# Patient Record
Sex: Male | Born: 1935 | Race: White | Hispanic: No | Marital: Married | Smoking: Former smoker
Health system: Southern US, Community
[De-identification: ages and names within clinical notes are randomized; demographics above are authoritative.]

## PROBLEM LIST (undated history)

## (undated) DIAGNOSIS — I739 Peripheral vascular disease, unspecified: Secondary | ICD-10-CM

## (undated) DIAGNOSIS — M1990 Unspecified osteoarthritis, unspecified site: Secondary | ICD-10-CM

## (undated) DIAGNOSIS — C801 Malignant (primary) neoplasm, unspecified: Secondary | ICD-10-CM

## (undated) DIAGNOSIS — I719 Aortic aneurysm of unspecified site, without rupture: Secondary | ICD-10-CM

## (undated) DIAGNOSIS — Z951 Presence of aortocoronary bypass graft: Secondary | ICD-10-CM

## (undated) DIAGNOSIS — D61818 Other pancytopenia: Secondary | ICD-10-CM

## (undated) DIAGNOSIS — I350 Nonrheumatic aortic (valve) stenosis: Secondary | ICD-10-CM

## (undated) DIAGNOSIS — D469 Myelodysplastic syndrome, unspecified: Secondary | ICD-10-CM

## (undated) DIAGNOSIS — I714 Abdominal aortic aneurysm, without rupture: Secondary | ICD-10-CM

## (undated) DIAGNOSIS — E785 Hyperlipidemia, unspecified: Secondary | ICD-10-CM

## (undated) DIAGNOSIS — K219 Gastro-esophageal reflux disease without esophagitis: Secondary | ICD-10-CM

## (undated) DIAGNOSIS — I10 Essential (primary) hypertension: Secondary | ICD-10-CM

## (undated) DIAGNOSIS — I9789 Other postprocedural complications and disorders of the circulatory system, not elsewhere classified: Secondary | ICD-10-CM

## (undated) DIAGNOSIS — I4891 Unspecified atrial fibrillation: Secondary | ICD-10-CM

## (undated) DIAGNOSIS — D126 Benign neoplasm of colon, unspecified: Secondary | ICD-10-CM

## (undated) DIAGNOSIS — L409 Psoriasis, unspecified: Secondary | ICD-10-CM

## (undated) DIAGNOSIS — I2510 Atherosclerotic heart disease of native coronary artery without angina pectoris: Secondary | ICD-10-CM

## (undated) DIAGNOSIS — N400 Enlarged prostate without lower urinary tract symptoms: Secondary | ICD-10-CM

## (undated) DIAGNOSIS — I6529 Occlusion and stenosis of unspecified carotid artery: Secondary | ICD-10-CM

## (undated) DIAGNOSIS — K922 Gastrointestinal hemorrhage, unspecified: Secondary | ICD-10-CM

## (undated) DIAGNOSIS — C4491 Basal cell carcinoma of skin, unspecified: Secondary | ICD-10-CM

## (undated) HISTORY — DX: Benign prostatic hyperplasia without lower urinary tract symptoms: N40.0

## (undated) HISTORY — DX: Occlusion and stenosis of unspecified carotid artery: I65.29

## (undated) HISTORY — DX: Malignant (primary) neoplasm, unspecified: C80.1

## (undated) HISTORY — DX: Psoriasis, unspecified: L40.9

## (undated) HISTORY — DX: Unspecified atrial fibrillation: I48.91

## (undated) HISTORY — PX: TONSILLECTOMY: SUR1361

## (undated) HISTORY — DX: Unspecified osteoarthritis, unspecified site: M19.90

## (undated) HISTORY — DX: Peripheral vascular disease, unspecified: I73.9

## (undated) HISTORY — DX: Nonrheumatic aortic (valve) stenosis: I35.0

## (undated) HISTORY — PX: PROSTATE SURGERY: SHX751

## (undated) HISTORY — DX: Abdominal aortic aneurysm, without rupture: I71.4

## (undated) HISTORY — DX: Gastro-esophageal reflux disease without esophagitis: K21.9

## (undated) HISTORY — DX: Unspecified atrial fibrillation: I97.89

## (undated) HISTORY — DX: Basal cell carcinoma of skin, unspecified: C44.91

## (undated) HISTORY — DX: Essential (primary) hypertension: I10

## (undated) HISTORY — DX: Atherosclerotic heart disease of native coronary artery without angina pectoris: I25.10

## (undated) HISTORY — DX: Benign neoplasm of colon, unspecified: D12.6

## (undated) HISTORY — DX: Gastrointestinal hemorrhage, unspecified: K92.2

## (undated) HISTORY — DX: Presence of aortocoronary bypass graft: Z95.1

## (undated) HISTORY — DX: Hyperlipidemia, unspecified: E78.5

## (undated) HISTORY — PX: CHOLECYSTECTOMY: SHX55

---

## 1988-12-22 HISTORY — PX: EYE SURGERY: SHX253

## 1997-12-22 DIAGNOSIS — I714 Abdominal aortic aneurysm, without rupture, unspecified: Secondary | ICD-10-CM

## 1997-12-22 HISTORY — DX: Abdominal aortic aneurysm, without rupture, unspecified: I71.40

## 1997-12-22 HISTORY — DX: Abdominal aortic aneurysm, without rupture: I71.4

## 1997-12-22 HISTORY — PX: ABDOMINAL AORTIC ANEURYSM REPAIR: SUR1152

## 2005-07-31 ENCOUNTER — Emergency Department: Payer: Self-pay | Admitting: Emergency Medicine

## 2005-07-31 ENCOUNTER — Other Ambulatory Visit: Payer: Self-pay

## 2006-10-16 ENCOUNTER — Ambulatory Visit: Payer: Self-pay | Admitting: Family Medicine

## 2006-12-22 DIAGNOSIS — N400 Enlarged prostate without lower urinary tract symptoms: Secondary | ICD-10-CM

## 2006-12-22 HISTORY — DX: Benign prostatic hyperplasia without lower urinary tract symptoms: N40.0

## 2007-03-04 ENCOUNTER — Ambulatory Visit: Payer: Self-pay | Admitting: Gastroenterology

## 2007-12-23 HISTORY — PX: SPINE SURGERY: SHX786

## 2008-02-12 ENCOUNTER — Emergency Department: Payer: Self-pay | Admitting: Emergency Medicine

## 2008-02-12 ENCOUNTER — Other Ambulatory Visit: Payer: Self-pay

## 2008-10-11 ENCOUNTER — Inpatient Hospital Stay (HOSPITAL_COMMUNITY): Admission: RE | Admit: 2008-10-11 | Discharge: 2008-10-13 | Payer: Self-pay | Admitting: Orthopedic Surgery

## 2009-09-18 ENCOUNTER — Ambulatory Visit: Payer: Self-pay | Admitting: Family Medicine

## 2009-09-28 ENCOUNTER — Ambulatory Visit: Payer: Self-pay | Admitting: Family Medicine

## 2009-10-22 HISTORY — PX: CARDIAC CATHETERIZATION: SHX172

## 2009-11-05 ENCOUNTER — Ambulatory Visit: Payer: Self-pay | Admitting: Internal Medicine

## 2009-11-28 ENCOUNTER — Ambulatory Visit: Payer: Self-pay | Admitting: Vascular Surgery

## 2010-02-22 ENCOUNTER — Ambulatory Visit: Payer: Self-pay | Admitting: Orthopedic Surgery

## 2010-03-26 ENCOUNTER — Ambulatory Visit: Payer: Self-pay | Admitting: Unknown Physician Specialty

## 2010-04-24 ENCOUNTER — Ambulatory Visit: Payer: Self-pay | Admitting: Pain Medicine

## 2010-05-21 ENCOUNTER — Ambulatory Visit: Payer: Self-pay | Admitting: Pain Medicine

## 2010-06-05 ENCOUNTER — Ambulatory Visit: Payer: Self-pay | Admitting: Pain Medicine

## 2010-06-12 ENCOUNTER — Ambulatory Visit: Payer: Self-pay | Admitting: Vascular Surgery

## 2010-06-27 ENCOUNTER — Ambulatory Visit: Payer: Self-pay | Admitting: Pain Medicine

## 2010-07-15 ENCOUNTER — Ambulatory Visit: Payer: Self-pay | Admitting: Pain Medicine

## 2010-10-09 ENCOUNTER — Ambulatory Visit: Payer: Self-pay | Admitting: Orthopedic Surgery

## 2010-10-16 ENCOUNTER — Ambulatory Visit: Payer: Self-pay | Admitting: Orthopedic Surgery

## 2010-12-12 ENCOUNTER — Ambulatory Visit: Payer: Self-pay | Admitting: Vascular Surgery

## 2011-01-29 ENCOUNTER — Other Ambulatory Visit (INDEPENDENT_AMBULATORY_CARE_PROVIDER_SITE_OTHER): Payer: MEDICARE

## 2011-01-29 ENCOUNTER — Ambulatory Visit: Payer: Self-pay | Admitting: Orthopedic Surgery

## 2011-01-29 DIAGNOSIS — I6529 Occlusion and stenosis of unspecified carotid artery: Secondary | ICD-10-CM

## 2011-02-14 NOTE — Procedures (Unsigned)
CAROTID DUPLEX EXAM  INDICATION:  Follow up carotid disease.  HISTORY: Diabetes:  No. Cardiac:  No. Hypertension:  Yes. Smoking:  Previous. Previous Surgery:  No. CV History:  No. Amaurosis Fugax No, Paresthesias No, Hemiparesis No.                                      RIGHT             LEFT Brachial systolic pressure:         128               128 Brachial Doppler waveforms:         WNL               WNL Vertebral direction of flow:        Occluded          Antegrade DUPLEX VELOCITIES (cm/sec) CCA peak systolic                   68                104 ECA peak systolic                   96                135 ICA peak systolic                   205               223 ICA end diastolic                   67                67 PLAQUE MORPHOLOGY:                  Heterogenous      Heterogenous PLAQUE AMOUNT:                      Moderate          Moderate PLAQUE LOCATION:                    ICA/CCA/ECA       ICA/CCA/ECA  IMPRESSION: 1. 60% to 79% bilateral internal carotid artery stenosis. 2. Antegrade left vertebral artery, occluded right vertebral artery. 3. Soft plaque formation observed in the left common carotid artery. 4. No significant change from the prior examination of 06/12/10.  ___________________________________________ Janetta Hora. Fields, MD  LT/MEDQ  D:  01/29/2011  T:  01/29/2011  Job:  [id number]

## 2011-04-14 ENCOUNTER — Ambulatory Visit: Payer: Self-pay | Admitting: Oncology

## 2011-04-22 ENCOUNTER — Ambulatory Visit: Payer: Self-pay | Admitting: Oncology

## 2011-05-06 NOTE — H&P (Signed)
NAME:  [name], [name]        ACCOUNT NO.:  [id number]   MEDICAL RECORD NO.:  [id number]          PATIENT TYPE:  INP   LOCATION:                               FACILITY:  [facility]   PHYSICIAN:  Georges Lynch. Gioffre, M.D.DATE OF BIRTH:  [birth year]   DATE OF ADMISSION:  10/11/2008  DATE OF DISCHARGE:                              HISTORY & PHYSICAL   CHIEF COMPLAINT:  Severe lower back and left leg pain.   HISTORY OF PRESENT ILLNESS:  The patient is an 77 year old gentleman who  is very active with [employer], has developed a severe  lower back, left leg pain while lifting some weights. It is making  ambulation difficult. He is very uncomfortable.  Evaluation found that  he has a herniated disk at L4-5 with significant stenosis.  The disk is  on the left and it migrates proximally.  The patient and Dr. Darrelyn Hillock  have reviewed CT myelograms and discussions and has elected to proceed  with a complete decompressive lumbar laminectomy at L4-5 or spinal  stenosis and a microdiskectomy at L4-5 on the left for a herniated disk.   ALLERGIES:  No known drug allergies.   CURRENT MEDICATIONS:  1. Avalide 300/12.5 mg once a day.  2. Centrum Silver 1 tablet a day.  3. Doxepin 10 mg q.h.s.  4. DynaCirc CR 10 mg once a day.  5. Ecotrin 325 mg a day.  6. Fish Oil 12,000 mg a day.  7. Glucosamine chondroitan 2 tablets a day.  8. Prilosec 20 mg once a day.  9. Zocor 40 mg once a day.  10.Vicodin p.r.n.  11.Robaxin p.r.n.   PAST MEDICAL HISTORY:  1. Hypertension.  2. Aortic aneurysm repair in 1999.  3. Reflux disease.  4. Hiatal hernia.  5. History of BPH with cryotherapy in 2008.  6. Hearing impaired.  7. Hypercholesterolemia.   REVIEW OF SYSTEMS:  Negative for any neurologic issues other than  difficulty hearing.  He does not use hearing aids.  PULMONARY:  Unremarkable.  CARDIOVASCULAR:  He does have history of an aneurysm  repaired surgically in 1999 without any  complications.  He does have  hypertension.  Last stress test was 02/2008 which was reported normal.  GI:  History of hiatal hernia reflux disease which is well controlled  with his current Protonix.  It does have an ulcer 45 years previous.  He  has had his gallbladder removed when he had his aorta repaired.  GU:  Unremarkable.  ENDOCRINE:  Unremarkable.  HEMATOLOGIC:  He did get blood  transfusion with his aorta and gallbladder removal.   PAST SURGICAL HISTORY:  Includes:  1. Aortic aneurysm repair in 1999.  2. Hernia repair.  3. Gallbladder removed without any complications with anesthesia.   FAMILY HISTORY:  Father is deceased with emphysema and heart attack at  the age of 75.  Mother is deceased at 63 from heart failure.  One  brother deceased from a heart attack at 32. One sister deceased with  cancer at age 63.   SOCIAL HISTORY:  The patient is married, lives with his wife.  He is  retired Network engineer.  He has smoked in the past.  No drugs or  alcohol.  He has 3 grown children. He lives in a 1 story home.   PHYSICAL EXAMINATION:  VITAL SIGNS:  Height is 5'10, weight is 165,  blood pressure 138/68, pulse is 70 and regular.  Respirations soft,  nonlabored.  The patient is afebrile.  The patient has difficulty  ambulating due to his significant lower back and left leg pain.  He gets  around with a wheelchair.  HEENT:  Head was normocephalic.  The patient did have some difficulty  hearing at times.  He had no hearing aids.  NECK:  Supple, no palpable lymphadenopathy, good range of motion.  CHEST:  Lung sounds were clear and equal bilaterally.  No wheezes, rales  or rhonchi.  HEART:  Regular rate and rhythm.  The patient had a large anterior  abdominal chest wall incision.  ABDOMEN:  Soft, bowel sounds are present.  EXTREMITIES:  Upper extremities had excellent range of motion with  shoulders, elbows and wrists with 5 out of 5 motor strength.  Lower extremities:  He had a  difficult flexing and extending his left  hip due to pain from the lower back around to the thigh.  He has good  motion both knees.  He has good motion both ankles.  NEURO:  The patient was conscious, alert, and appropriate.  He had  intact light touch sensation in the lower extremities. He did have some  weak hip flexors on the left.  He did have painful motion of the back.  PERIPHERAL VASCULAR:  Carotid pulses are 2+, no bruits.  Radial pulses  2+, dorsalis pedis pulses 1+.  He had no lower extremity edema or venous  stasis changes.   IMPRESSION:  1. Spinal stenosis L4-5 with herniated disk at L4-5 on the left.  2. Hypertension.  3. History of aortic aneurysm repair.  4. Reflux disease with hiatal hernia.  5. Hypercholesterolemia.  6. Hearing impaired.  7. Benign prostatic hypertrophy.  8. Recent March 2009 stress test was reported normal.   PLAN:  The patient has been evaluated by his primary care physician and  cleared for this upcoming surgical procedure.  The patient will undergo  all routine labs and tests prior to having a complete decompressive  lumbar laminectomy at L4-5 or spinal stenosis and a microdiskectomy at  L4-5 on the left for a herniated disk by Dr. Darrelyn Hillock at [facility] on October 11, 2008.      Jamelle Rushing, P.A.    ______________________________  Georges Lynch Darrelyn Hillock, M.D.    RWK/MEDQ  D:  10/03/2008  T:  10/03/2008  Job:  [id number]   cc:   Windy Fast A. Darrelyn Hillock, M.D.  Fax: [phone]

## 2011-05-06 NOTE — Procedures (Signed)
CAROTID DUPLEX EXAM   INDICATION:  Carotid disease.   HISTORY:  Diabetes:  No.  Cardiac:  No.  Hypertension:  Yes.  Smoking:  Previous.  Previous Surgery:  No.  CV History:  Currently asymptomatic.  Amaurosis Fugax No, Paresthesias No, Hemiparesis No.                                       RIGHT             LEFT  Brachial systolic pressure:         138               128  Brachial Doppler waveforms:         Normal            Normal  Vertebral direction of flow:        Not visualized    Antegrade  DUPLEX VELOCITIES (cm/sec)  CCA peak systolic                   59                83  ECA peak systolic                   89                79  ICA peak systolic                   209               208  ICA end diastolic                   69                60  PLAQUE MORPHOLOGY:                  Mixed             Mixed  PLAQUE AMOUNT:                      Moderate-to-severe                  Moderate-to-severe  PLAQUE LOCATION:                    ICA/ECA           ICA/ECA/CCA   IMPRESSION:  1. Doppler velocities suggest a 60% to 79% stenosis of the bilateral      proximal internal carotid arteries.  2. No significant change noted when compared to the previous      examination on 11/28/2009.     ___________________________________________  Janetta Hora. Fields, MD   CH/MEDQ  D:  06/12/2010  T:  06/12/2010  Job:  [id number]

## 2011-05-06 NOTE — Assessment & Plan Note (Signed)
OFFICE VISIT   [name], [name]  DOB:  05-29-1926                                       11/28/2009  CHART#:[id number]   CHIEF COMPLAINT:  Carotid stenosis.   HISTORY OF PRESENT ILLNESS:  The patient is an 77 year old male referred  by Dr. Burnett Sheng for evaluation of asymptomatic carotid stenosis.  The  patient denies any prior history of TIA, amaurosis or stroke.  Carotid  stenosis was found after surveillance ultrasound scan.   CHRONIC MEDICAL PROBLEMS:  Include hypertension, elevated cholesterol  both of which are stable and followed by Dr. Burnett Sheng.  The patient is  also a prior smoker and quit in 1995.   PAST SURGICAL HISTORY:  Abdominal aortic aneurysm in 1999 at [facility] and back surgery for a ruptured disk in 2009.   FAMILY HISTORY:  Unremarkable.   SOCIAL HISTORY:  He is married and has 3 children.  Smoking history is  as above.  He does not consume alcohol regularly.   REVIEW OF SYSTEMS:  Full 12 point review of systems was performed with  the patient.  Please see intake referral form for details.   MEDICATIONS:  1. Prilosec 20 mg once a day.  2. Glucosamine chondroitin.  3. Fish oil.  4. Centrum Silver.  5. Avalide 300/12.5 once a day.  6. Ecotrin 81 mg two daily.  7. DynaCirc 10 mg once a day.  8. Crestor 20 mg once a day.   ALLERGIES:  No known drug allergies.   In addition to his recent carotid workup he also had a cardiac  catheterization.  The records from Dr. Burnett Sheng were reviewed today which  mentions a 50%-60% stenosis of his coronary arteries but these are not  specifically mentioned.  He also had a carotid duplex exam at [facility] and a CT angiogram of the neck which showed a  75% stenosis of the left and right carotid artery.   PHYSICAL EXAM:  Vital signs:  Blood pressure is 151/76 in the left arm,  138/83 in the right arm, pulse is 69 and regular.  Temperature is 97.8.  HEENT:   Unremarkable.  Neck:  Has 2+ carotid pulses without bruit.  Chest:  Clear to auscultation.  Cardiac:  Exam is regular rate and  rhythm without murmur.  Abdomen:  Soft, nontender, nondistended with  well-healed transverse scar.  No masses.  Extremities:  He has 2+ radial  pulses bilaterally.  He has 2+ femoral pulses bilaterally.  He has 3+  popliteal pulses bilaterally.  He has absent pedal pulses bilaterally.  Feet are pink, warm and well-perfused.  Neurologic:  Exam shows  symmetric upper extremity and lower extremity motor strength which is  5/5 and symmetric.  Cranial nerves II-XII are intact.  Skin:  Shows no  ulcers or rashes.  Musculoskeletal:  Shows no major obvious joint  deformities.   He had a repeat carotid duplex exam in our office today which again  shows a 60%-80% stenosis of both internal carotid arteries.  The end  diastolic velocity of both arteries, however, was only 78 on the right  side and 72 on the left side.   In summary, the patient has an asymptomatic 70% bilateral internal  carotid artery stenosis.  He is currently on antiplatelet therapy as  well as lipid lowering agents.  As long as he remains asymptomatic and  the stenosis remains less than 80% I believe the best option is  continued risk factor modification and management and continued serial  ultrasound.  If the stenosis progresses to greater than 80% then we  would consider carotid endarterectomy at that point.  All these options  were discussed with the patient today.  He will return for followup and  a repeat carotid duplex exam in 6 months' time.     Janetta Hora. Fields, MD  Electronically Signed   CEF/MEDQ  D:  11/29/2009  T:  11/29/2009  Job:  [id number]   cc:   Jerl Mina

## 2011-05-06 NOTE — Procedures (Signed)
CAROTID DUPLEX EXAM   INDICATION:  Carotid artery disease.   HISTORY:  Diabetes:  No.  Cardiac:  No.  Hypertension:  Yes.  Smoking:  Previous.  Previous Surgery:  No carotid surgery.  CV History:  Asymptomatic.  Amaurosis Fugax No, Paresthesias No, Hemiparesis No                                       RIGHT             LEFT  Brachial systolic pressure:         132               130  Brachial Doppler waveforms:         WNL               WNL  Vertebral direction of flow:        Antegrade (abnormal)                Antegrade  DUPLEX VELOCITIES (cm/sec)  CCA peak systolic                   86                92  ECA peak systolic                   107               161  ICA peak systolic                   267               243  ICA end diastolic                   78                72  PLAQUE MORPHOLOGY:                  Heterogeneous     Heterogeneous /  homogeneous  PLAQUE AMOUNT:                      Mild / severe     Mild / severe  PLAQUE LOCATION:                    ICA / CCA         ICA / CCA   IMPRESSION:  1. Bilateral internal carotid arteries show evidence of 60%-79%      stenosis correlating with previous ultrasound and CT done at      [facility].  2. Bilateral  vertebral arteries appear antegrade, however, right      demonstrates abnormal flow morphology.  3. Incidental note:  Left mid common carotid artery homogeneous plaque      has an irregular flow surface.   ___________________________________________  Janetta Hora. Fields, MD   AS/MEDQ  D:  11/28/2009  T:  11/28/2009  Job:  [id number]

## 2011-05-06 NOTE — Op Note (Signed)
NAME:  RAYFIELD, BEEM NO.:  [id number]   MEDICAL RECORD NO.:  [id number]          PATIENT TYPE:  INP   LOCATION:  0009                         FACILITY:  [facility]   PHYSICIAN:  Georges Lynch. Gioffre, M.D.DATE OF BIRTH:  01-Apr-1926   DATE OF PROCEDURE:  10/11/2008  DATE OF DISCHARGE:                               OPERATIVE REPORT   SURGEON:  Dr. Darrelyn Hillock.   ASSISTANT:  Dr. Marlowe Kays.   PREOPERATIVE DIAGNOSIS:  1. Spinal stenosis at L4-5.  2. Herniated disk L4-5 on the left.  He had severe pain in his left      lower extremity preoperatively.   POSTOPERATIVE DIAGNOSIS:  1. Spinal stenosis at L4-5.  2. Herniated disk L4-5 on the left.  He had severe pain in his left      lower extremity preoperatively.   OPERATION:  1. Decompressive lumbar laminectomy at L4-5 for spinal stenosis.  2. Microdiskectomy at L4-5 for a large herniated disk on the left.  3. Foraminotomies bilaterally for the L4-5 level.   PROCEDURE:  Under general anesthesia, routine orthopedic prepping and  draping of the lower back carried out.  The patient had 1 gram of IV  Ancef.  Two needles were placed in the back for localization purposes,  and an x-ray was taken.  At this time, we made an incision over the L3-  4, L4-5 space.  Bleeders identified and cauterized.  The muscle then was  stripped from the lamina and spinous processes bilaterally.  Second x-  ray was taken for position purposes.  We then went down and identified  the lamina distal to the spinous process distally identified the L3  spinous process.  An instrument was placed in the disk space at 4-5.  At  that time, we then removed the spinous process of L4.  We went down, and  before we started our laminectomy, we brought the microscope in.  An x-  ray was taken with an instrument in the space to verify the L4-5 space  once again.  We then went down and did a complete central decompression-  type lumbar laminectomy.  We preserved  the facets.  We went out wide and  did foraminotomies.  We protected the dura at all times while using the  microscope.  Following this, we now did foraminotomies bilaterally.  We  went out far laterally at 4-5, gently retracted the dura with the  D'Errico retractor, cauterized lateral recess veins, and identified a  large herniated disk.  We then made a cruciate incision in the posterior  longitudinal ligament and a large amount of disk material extruded out  under pressure.  We then went into the space, cleaned the space out and  then went subligamentous with the Epstein curettes as well as the nerve  hook.  We made sure the root now was free.  We were able now to easily  pass a hockey-stick proximally and distally at the foraminotomy.  I  thoroughly irrigated out the area.  The root and dura now was easily  movable.  We then loosely applied some thrombin-soaked Gelfoam.  I  then  closed the wound in layers in usual fashion except I left a small  proximal deep distal part of the wound open for drainage purposes.  The  subcutaneous tissue was closed with 0 Vicryl.  Skin was closed with  metal staples.  A sterile Neosporin dressing was applied.   SURGEON:  Dr. Darrelyn Hillock.   ASSISTANT:  Dr. Fayrene Fearing [place].           ______________________________  Georges Lynch. Darrelyn Hillock, M.D.     RAG/MEDQ  D:  10/11/2008  T:  10/11/2008  Job:  [id number]   cc:   Windy Fast A. Darrelyn Hillock, M.D.  Fax: [phone]   Jerl Mina  Fax: [phone]

## 2011-05-06 NOTE — Procedures (Signed)
LOWER EXTREMITY ARTERIAL DUPLEX   INDICATION:  Fullness and palpable bilateral popliteal arteries.   HISTORY:  Diabetes:  No.  Cardiac:  No.  Hypertension:  Yes.  Smoking:  Quit.  Previous Surgery:   SINGLE LEVEL ARTERIAL EXAM                          RIGHT                LEFT  Brachial:               141                  131  Anterior tibial:        156                  151  Posterior tibial:       131                  140  Peroneal:  Ankle/Brachial Index:   1.11                 1.07   LOWER EXTREMITY ARTERIAL DUPLEX EXAM   DUPLEX:  No evidence of popliteal artery aneurysm noted bilaterally.   IMPRESSION:  1. No evidence of popliteal artery aneurysm noted bilaterally.  2. Normal ankle brachial index with biphasic Doppler waveforms noted      in bilateral legs.   ___________________________________________  Janetta Hora Fields, MD   MG/MEDQ  D:  11/28/2009  T:  11/29/2009  Job:  [id number]

## 2011-05-09 NOTE — Discharge Summary (Signed)
NAME:  [name], [name] NO.:  [id number]   MEDICAL RECORD NO.:  [id number]          PATIENT TYPE:  INP   LOCATION:  1509                         FACILITY:  [facility]   PHYSICIAN:  Georges Lynch. Gioffre, M.D.DATE OF BIRTH:  [birth year]   DATE OF ADMISSION:  10/11/2008  DATE OF DISCHARGE:  10/13/2008                               DISCHARGE SUMMARY   DISCHARGE SUMMARY CONTINUATION   CONDITION ON DISCHARGE:  The patient's condition upon discharge home is  improved.      Jamelle Rushing, P.A.    ______________________________  Georges Lynch Darrelyn Hillock, M.D.    RWK/MEDQ  D:  11/01/2008  T:  11/01/2008  Job:  [id number]

## 2011-05-09 NOTE — Discharge Summary (Signed)
NAME:  [name], [name] NO.:  [id number]   MEDICAL RECORD NO.:  [id number]          PATIENT TYPE:  INP   LOCATION:  1509                         [facility]ACILITY:  [facility]   PHYSICIAN:  Georges Lynch. Gioffre, M.D.DATE OF BIRTH:  Aug 22, 1926   DATE OF ADMISSION:  10/11/2008  DATE OF DISCHARGE:  10/13/2008                               DISCHARGE SUMMARY   ADMISSION DIAGNOSES:  1. Spinal stenosis L4 with herniated disk at L4-5 on the left.  2. Hypertension.  3. History of aortic aneurysm repair.  4. Reflux disease with hiatal hernia.  5. Hypercholesterolemia.  6. Hearing impaired.  7. Benign prostatic hypertrophy.   DISCHARGE DIAGNOSES:  1. Decompressive lumbar laminectomy for spinal stenosis at L4-5 with      microdiskectomy at L4-5 on the left.  2. Hypertension.  3. History of aortic aneurysm repair.  4. Reflux disease with hiatal hernia.  5. Hypercholesterolemia.  6. Hearing impaired  7. Benign prostatic hypertrophy.   HISTORY OF PRESENT ILLNESS:  The patient is an 77 year old gentleman who  is very active, having severe lower back and left leg pain.  The patient  was lifting some weights, had increased back pain and difficulty with  ambulation.  Evaluation found he had significant spinal stenosis at L4-5  with a large herniated disk at L4-5 on the left.  The patient has  elected to proceed with surgical corrections.   ALLERGIES:  NO KNOWN DRUG ALLERGIES.   MEDICATIONS ON ADMISSION:  1. Avalide 300/12.5 mg a day.  2. Centrum Silver one tablet a day.  3. Doxepin 10 mg q.h.s.  4. DynaCirc CR 10 mg a day.  5. Ecotrin 325 mg a day.  6. Fish oil 1200 units a day.  7. Glucosamine chondroitin 2 tablets a day.  8. Prilosec 20 mg a day.  9. Zocor 40 mg a day.  10.Vicodin p.r.n.  11.Robaxin p.r.n.   SURGICAL PROCEDURES:  On October 11, 2008, the patient was taken to the  OR by Dr. Worthy Rancher and assisted by Dr. Simonne Come.  Under general  anesthesia, the patient  underwent a decompressive lumbar laminectomy at  L4-5 for spinal stenosis and a microdiskectomy at L4-5 on the left for  herniated disk.  The patient tolerated the procedure well.  There was  estimated 50 ml of blood loss.  No complications.  The patient was  transferred recovery room then to the orthopedic floor in good condition  to follow back protocol.   CONSULTATIONS:  The following routine consults were requested:  Physical  therapy, case management, pharmacy.   HOSPITAL COURSE:  On October 11, 2008, the patient was admitted to  [facility] under the care of Dr. Worthy Rancher.  The patient was  taken to the OR where central decompressive lumbar laminectomy was  performed at L4-5 with a microdiskectomy at L4-5 on the left.  The  patient tolerated the procedure well.  There are no complications.  The  patient was transferred to recovery room and then to the orthopedic  floor in good condition with IV pain medicines, antibiotics to follow  back protocol.  The  patient then spent 2 days postoperative care on the  orthopedic floor, and the patient did very well.  His vital signs  remained stable.  He remained afebrile.  His H&H remained stable without  any significant blood loss.  The patient's wound remained benign for any  signs of infection.  His leg remained neuromotor vascularly intact.  He  just had a little bit of back pain on date of discharge.  He was able to  ambulate with physical therapy well.  On postop day #2 his vital signs  were stable.  The patient was neurologically intact with just a little  bit of back pain, and the patient was discharged with outpatient follow-  up.   LABORATORY DATA:  CBC on admission found WBCs 4.9, hemoglobin 14.1,  hematocrit 41.4, platelets 154, H&H on discharge was 11.6/33.3.  Routine  chemistries on admission all within normal limits.  Estimated GFR was  greater than 60.  Urinalysis on admission was normal with a culture of  only  8000 colonies of insignificant growth.   DISCHARGE INSTRUCTIONS:  1. Diet:  No restrictions.  2. Activity:  The patient is to get up and walk as instructed by      therapist.  3. Wound care.  The patient is to change dressing daily.   FOLLOW UP:  Patient needs follow-up appoint with Dr. Darrelyn Hillock in 2 weeks  from date of surgery.  The patient is to call [phone] at that follow-up  appointment.   Home health physical therapy through [place].   MEDICATIONS:  1. Percocet 10/650 one every 4-6 hours for pain if needed.  2. Robaxin 500 mg once every 6 hours for muscle spasms if needed.  3. Simvastatin 40 mg a day.  4. Felodipine ER 10 mg a day.  5. Avalide 300/12.5 mg a day.  6. Multivitamin once a day.  7. Prilosec 20 mg a day.  8. Ecotrin 81 mg 2 tablets once a day.  9. Fish oil two times a day.  10.Glucosamine chondroitin two times a day.  11.Vicodin to be placed on hold while using Percocet.      Jamelle Rushing, P.A.    ______________________________  Georges Lynch Darrelyn Hillock, M.D.    RWK/MEDQ  D:  11/01/2008  T:  11/01/2008  Job:  [id number]

## 2011-07-07 ENCOUNTER — Ambulatory Visit: Payer: Self-pay | Admitting: Oncology

## 2011-07-23 ENCOUNTER — Ambulatory Visit: Payer: Self-pay | Admitting: Oncology

## 2011-07-25 ENCOUNTER — Inpatient Hospital Stay: Payer: Self-pay | Admitting: Surgery

## 2011-07-28 ENCOUNTER — Other Ambulatory Visit: Payer: Self-pay

## 2011-09-22 ENCOUNTER — Ambulatory Visit (INDEPENDENT_AMBULATORY_CARE_PROVIDER_SITE_OTHER): Payer: Medicare Other | Admitting: *Deleted

## 2011-09-22 DIAGNOSIS — I6529 Occlusion and stenosis of unspecified carotid artery: Secondary | ICD-10-CM

## 2011-09-23 LAB — COMPREHENSIVE METABOLIC PANEL
ALT: 42
AST: 28
Albumin: 4.1
Alkaline Phosphatase: 61
BUN: 12
CO2: 30
Calcium: 9.3
Chloride: 100
Creatinine, Ser: 0.97
GFR calc Af Amer: >60
GFR calc non Af Amer: >60
Glucose, Bld: 98
Potassium: 3.9
Sodium: 135
Total Bilirubin: 0.9
Total Protein: 6.4

## 2011-09-23 LAB — CBC
HCT: 41.4
Hemoglobin: 14.1
MCHC: 34.1
MCV: 89.4
Platelets: 154
RBC: 4.63
RDW: 13.7
WBC: 4.9

## 2011-09-23 LAB — DIFFERENTIAL
Basophils Absolute: 0
Basophils Relative: 0
Eosinophils Absolute: 0
Eosinophils Relative: 1
Lymphocytes Relative: 25
Lymphs Abs: 1.2
Monocytes Absolute: 0.4
Monocytes Relative: 8
Neutro Abs: 3.3
Neutrophils Relative %: 66

## 2011-09-23 LAB — URINALYSIS, ROUTINE W REFLEX MICROSCOPIC
Bilirubin Urine: NEGATIVE
Glucose, UA: NEGATIVE
Hgb urine dipstick: NEGATIVE
Ketones, ur: NEGATIVE
Nitrite: NEGATIVE
Protein, ur: NEGATIVE
Specific Gravity, Urine: 1.009
Urobilinogen, UA: 0.2
pH: 7.5

## 2011-09-23 LAB — HEMOGLOBIN AND HEMATOCRIT, BLOOD
HCT: 33.3 — ABNORMAL LOW
HCT: 37 — ABNORMAL LOW
HCT: 37.1 — ABNORMAL LOW
Hemoglobin: 11.6 — ABNORMAL LOW
Hemoglobin: 12.7 — ABNORMAL LOW
Hemoglobin: 12.9 — ABNORMAL LOW

## 2011-09-23 LAB — URINE CULTURE
Colony Count: 8000
Special Requests: NEGATIVE

## 2011-09-23 LAB — PROTIME-INR
INR: 1
Prothrombin Time: 13.4

## 2011-09-23 LAB — APTT: aPTT: 32

## 2011-09-26 NOTE — Procedures (Unsigned)
CAROTID DUPLEX EXAM  INDICATION:  Follow up carotid artery disease.  HISTORY: Diabetes:  No. Cardiac:  No. Hypertension:  Yes. Smoking:  Previous. Previous Surgery:  No. CV History:  No. Amaurosis Fugax No, Paresthesias No, Hemiparesis No.                                      RIGHT             LEFT Brachial systolic pressure:         132               133 Brachial Doppler waveforms:         Normal            Normal Vertebral direction of flow:        Occluded          Antegrade DUPLEX VELOCITIES (cm/sec) CCA peak systolic                   54                61 ECA peak systolic                   59                87 ICA peak systolic                   178               202 ICA end diastolic                   65                67 PLAQUE MORPHOLOGY:                  Mixed             Mixed PLAQUE AMOUNT:                      Moderate          Moderate PLAQUE LOCATION:                    ICA, CCA, ECA     ICA, CCA, ECA  IMPRESSION: 1. Bilateral internal carotid artery velocities suggest 60% to 79%     stenosis. 2. Soft plaque noted in the left common carotid artery. 3. Stable in comparison to previous examination.  ___________________________________________ Janetta Hora Darrick Penna, MD  EM/MEDQ  D:  09/22/2011  T:  09/22/2011  Job:  [id number]

## 2011-10-03 ENCOUNTER — Ambulatory Visit: Payer: Self-pay | Admitting: Oncology

## 2011-10-23 ENCOUNTER — Ambulatory Visit: Payer: Self-pay | Admitting: Oncology

## 2012-02-02 ENCOUNTER — Ambulatory Visit: Payer: Self-pay | Admitting: Oncology

## 2012-02-02 LAB — CBC CANCER CENTER
Basophil #: 0 x10 3/mm (ref 0.0–0.1)
Basophil %: 0.6 %
Eosinophil #: 0.1 x10 3/mm (ref 0.0–0.7)
Eosinophil %: 2.6 %
HCT: 36.5 % — ABNORMAL LOW (ref 40.0–52.0)
HGB: 12.8 g/dL — ABNORMAL LOW (ref 13.0–18.0)
Lymphocyte #: 1 x10 3/mm (ref 1.0–3.6)
Lymphocyte %: 31.6 %
MCH: 30.2 pg (ref 26.0–34.0)
MCHC: 35.1 g/dL (ref 32.0–36.0)
MCV: 86 fL (ref 80–100)
Monocyte #: 0.4 x10 3/mm (ref 0.0–0.7)
Monocyte %: 11.2 %
Neutrophil #: 1.7 x10 3/mm (ref 1.4–6.5)
Neutrophil %: 54 %
Platelet: 97 x10 3/mm — ABNORMAL LOW (ref 150–440)
RBC: 4.24 10*6/uL — ABNORMAL LOW (ref 4.40–5.90)
RDW: 14.1 % (ref 11.5–14.5)
WBC: 3.1 x10 3/mm — ABNORMAL LOW (ref 3.8–10.6)

## 2012-02-20 ENCOUNTER — Ambulatory Visit: Payer: Self-pay | Admitting: Unknown Physician Specialty

## 2012-02-20 ENCOUNTER — Ambulatory Visit: Payer: Self-pay | Admitting: Oncology

## 2012-02-24 LAB — PATHOLOGY REPORT

## 2012-04-08 ENCOUNTER — Other Ambulatory Visit: Payer: Medicare Other

## 2012-04-08 ENCOUNTER — Ambulatory Visit: Payer: Medicare Other | Admitting: Vascular Surgery

## 2012-04-15 ENCOUNTER — Other Ambulatory Visit: Payer: Medicare Other

## 2012-04-15 ENCOUNTER — Ambulatory Visit: Payer: Medicare Other | Admitting: Vascular Surgery

## 2012-04-23 ENCOUNTER — Encounter: Payer: Self-pay | Admitting: Vascular Surgery

## 2012-04-28 ENCOUNTER — Encounter: Payer: Self-pay | Admitting: Vascular Surgery

## 2012-04-29 ENCOUNTER — Other Ambulatory Visit: Payer: Self-pay | Admitting: *Deleted

## 2012-04-29 ENCOUNTER — Other Ambulatory Visit (INDEPENDENT_AMBULATORY_CARE_PROVIDER_SITE_OTHER): Payer: Medicare Other | Admitting: *Deleted

## 2012-04-29 ENCOUNTER — Encounter: Payer: Self-pay | Admitting: Vascular Surgery

## 2012-04-29 ENCOUNTER — Ambulatory Visit (INDEPENDENT_AMBULATORY_CARE_PROVIDER_SITE_OTHER): Payer: Medicare Other | Admitting: *Deleted

## 2012-04-29 ENCOUNTER — Ambulatory Visit (INDEPENDENT_AMBULATORY_CARE_PROVIDER_SITE_OTHER): Payer: Medicare Other | Admitting: Vascular Surgery

## 2012-04-29 VITALS — BP 177/85 | HR 62 | Temp 97.6°F | Ht 70.0 in | Wt 165.0 lb

## 2012-04-29 DIAGNOSIS — I6529 Occlusion and stenosis of unspecified carotid artery: Secondary | ICD-10-CM | POA: Insufficient documentation

## 2012-04-29 DIAGNOSIS — R109 Unspecified abdominal pain: Secondary | ICD-10-CM

## 2012-04-29 NOTE — Progress Notes (Signed)
[facility] [place] HISTORY AND PHYSICAL    Is an 78-year-old male that we have been following for bilateral asymptomatic moderate carotid stenosis. He returns today for routine followup of this. However, he is also referred by Dr. 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 He has had a one-month history of post prandial abdominal pain. He has had no weight loss. He had an upper endoscopy recently which showed some gastritis and a negative colonoscopy. He has previously had a small bowel obstruction. He has also previously had transabdominal, aortic aneurysm repair.  He describes the abdominal pain is crampy in nature. He does not have pain otherwise. He apparently did have some blood per rectum. He is overall very active and recently competed in the senior games also has a home gym he uses daily.  He denies any claudication symptoms. He denies any symptoms of TIA amaurosis or stroke.  Other medical problems include recent diagnosis of prostate cancer and he is considering radiation therapy. He also has multiple joint arthritis, hypertension, reflux, coronary disease, hyperlipidemia.  These are currently controlled.  He was placed on Carafate recently but really this has not improved his symptoms.   Past Medical History  Diagnosis Date  . Enlarged prostate 2008    Thermotherapy  . Arthritis   . Hypertension   . Psoriasis   . Gastroesophageal reflux disease   . Peripheral vascular disease   . CAD (coronary artery disease)   . Hyperlipidemia   . Adenomatous colon polyp   . Aortic aneurysm 1999    repair  . Carotid artery occlusion   . AAA (abdominal aortic aneurysm) 1999  . Cancer     prostate    Past Surgical History  Procedure Date  . Cholecystectomy   . Cardiac catheterization 10/2009  . Spine surgery 2009    ruptured disc  . Abdominal aortic aneurysm repair 199     Social History History  Substance Use Topics  . Smoking status: Former  Smoker    Types: Cigarettes    Quit date: 04/29/1997  . Smokeless tobacco: Never Used  . Alcohol Use: No    Family History Family History  Problem Relation Age of Onset  . Heart attack Father   . Cancer Sister   . Heart disease Brother     before age 60  . Heart attack Brother     Allergies  No Known Allergies   Current Outpatient Prescriptions  Medication Sig Dispense Refill  . aspirin 325 MG tablet Take 325 mg by mouth daily. 1/2 tablet daily      . felodipine (PLENDIL) 10 MG 24 hr tablet Take 10 mg by mouth daily.      . fish oil-omega-3 fatty acids 1000 MG capsule Take 2 g by mouth daily.      . glucosamine-chondroitin 500-400 MG tablet Take 1 tablet by mouth 3 (three) times daily.      . Multiple Vitamin (MULTIVITAMIN) tablet Take 1 tablet by mouth daily.      . omeprazole (PRILOSEC) 20 MG capsule Take 20 mg by mouth daily.      . rosuvastatin (CRESTOR) 20 MG tablet Take 20 mg by mouth daily.      . sucralfate (CARAFATE) 1 G tablet Take 1 g by mouth 2 (two) times daily.      . valsartan-hydrochlorothiazide (DIOVAN-HCT) 320-12.5 MG per tablet Take 1 tablet by mouth daily.      . calcium carbonate (TUMS - DOSED IN MG ELEMENTAL   CALCIUM) 500 MG chewable tablet Chew 1 tablet by mouth daily.        ROS:   General:  No weight loss, Fever, chills  HEENT: No recent headaches, no nasal bleeding, no visual changes, no sore throat  Neurologic: No dizziness, blackouts, seizures. No recent symptoms of stroke or mini- stroke. No recent episodes of slurred speech, or temporary blindness.  Cardiac: No recent episodes of chest pain/pressure, no shortness of breath at rest.  No shortness of breath with exertion.  Denies history of atrial fibrillation or irregular heartbeat  Vascular: No history of rest pain in feet.  No history of claudication.  No history of non-healing ulcer, No history of DVT   Pulmonary: No home oxygen, no productive cough, no hemoptysis,  No asthma or  wheezing  Musculoskeletal:  [x ] Arthritis, [ ] Low back pain,  [ ] Joint pain  Hematologic:No history of hypercoagulable state.  No history of easy bleeding.  No history of anemia  Gastrointestinal: + hematochezia or melena,  + gastroesophageal reflux, no trouble swallowing  Urinary: [ ] chronic Kidney disease, [ ] on HD - [ ] MWF or [ ] TTHS, [ ] Burning with urination, [ ] Frequent urination, [ ] Difficulty urinating;   Skin: No rashes  Psychological: No history of anxiety,  No history of depression   Physical Examination  Filed Vitals:   04/29/12 1104 04/29/12 1106  BP: 179/78 177/85  Pulse: 62 62  Temp: 97.6 F (36.4 C)   TempSrc: Oral   Height: 5' 10" (1.778 m)   Weight: 165 lb (74.844 kg)     Body mass index is 23.68 kg/(m^2).  General:  Alert and oriented, no acute distress HEENT: Normal Neck: No bruit or JVD Pulmonary: Clear to auscultation bilaterally Cardiac: Regular Rate and Rhythm without murmur Abdomen: Soft, non-tender, non-distended, no mass, transverse abdominal scar, prominent aortic pulsation just left of the umbilicus, audible bruit epigastric Skin: No rash Extremity Pulses:  2+ radial, brachial, femoral bilaterally Musculoskeletal: No deformity or edema  Neurologic: Upper and lower extremity motor 5/5 and symmetric  DATA: He had bilateral carotid duplex exam today which again shows a 60-80% stenosis bilaterally with some left common carotid stenosis. However peak systolic velocities are still in the 200 range. This is minimally changed over the last 2 years. He also had an abdominal duplex exam which showed slightly increased velocities in the celiac but velocities greater than 350 cm/s superior mesenteric artery the IMA was not well visualized   ASSESSMENT:   History consistent with chronic mesenteric ischemia. 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 #2 stable bilateral moderate carotid stenosis   PLAN:   Mesenteric angiogram possible intervention on 05/14/2012 risks benefits possible complications procedure details including but limited to vessel injury mesenteric artery stenting bleeding infection contrast reaction were explained to the patient today. He understands and agrees to proceed  Javionna Leder, MD [facility]  Office: [phone] Pager: [phone]  

## 2012-05-03 ENCOUNTER — Ambulatory Visit: Payer: Self-pay | Admitting: Oncology

## 2012-05-03 LAB — CBC CANCER CENTER
Basophil #: 0 x10 3/mm (ref 0.0–0.1)
Basophil %: 0.5 %
Eosinophil #: 0 x10 3/mm (ref 0.0–0.7)
Eosinophil %: 1.3 %
HCT: 38.2 % — ABNORMAL LOW (ref 40.0–52.0)
HGB: 12.8 g/dL — ABNORMAL LOW (ref 13.0–18.0)
Lymphocyte #: 1 x10 3/mm (ref 1.0–3.6)
Lymphocyte %: 32.9 %
MCH: 28.4 pg (ref 26.0–34.0)
MCHC: 33.6 g/dL (ref 32.0–36.0)
MCV: 85 fL (ref 80–100)
Monocyte #: 0.3 x10 3/mm (ref 0.2–1.0)
Monocyte %: 10.4 %
Neutrophil #: 1.7 x10 3/mm (ref 1.4–6.5)
Neutrophil %: 54.9 %
Platelet: 86 x10 3/mm — ABNORMAL LOW (ref 150–440)
RBC: 4.51 10*6/uL (ref 4.40–5.90)
RDW: 14.4 % (ref 11.5–14.5)
WBC: 3.1 x10 3/mm — ABNORMAL LOW (ref 3.8–10.6)

## 2012-05-06 ENCOUNTER — Encounter (HOSPITAL_COMMUNITY): Payer: Self-pay | Admitting: Pharmacy Technician

## 2012-05-06 NOTE — Procedures (Unsigned)
MESENTERIC ARTERIAL DUPLEX EVALUATION  INDICATION:  Abdominal pain.  HISTORY: Diabetes:  No Cardiac:  No Hypertension:  Yes Smoking:  Quit  Mesenteric Duplex Findings: Aorta - Proximal                            66 cm/s Aorta - Mid                                 52 cm/s Aorta - Distal                              46 cm/s  Celiac Trunk - Proximal                     285 cm/s Celiac Trunk - Distal                       281 cm/s  Hepatic Artery                              111 cm/s Splenic Artery                              120 cm/s  Superior Mesenteric Artery-Origin           357 cm/s Superior Mesenteric Artery-Proximal         300 cm/s Superior Mesenteric Artery-Mid              126 cm/s Superior Mesenteric Artery-Distal           169 cm/s  Inferior Mesenteric Artery-Proximal         Not well visualized  IMPRESSION:  Increased velocities in the celiac and SMA arteries suggestive of stenosis; however, no plaque is visualized and the vessels are tortuous.  ___________________________________________ Janetta Hora Fields, MD  SS/MEDQ  D:  04/29/2012  T:  04/30/2012  Job:  [id number]

## 2012-05-06 NOTE — Procedures (Unsigned)
CAROTID DUPLEX EXAM  INDICATION:  Follow up carotid artery stenosis.  HISTORY: Diabetes:  No. Cardiac:  No. Hypertension:  Yes. Smoking:  Quit. Previous Surgery:  No. CV History:  Asymptomatic. Amaurosis Fugax No, Paresthesias No, Hemiparesis No                                      RIGHT             LEFT Brachial systolic pressure:         148               160 Brachial Doppler waveforms:         Triphasic         Triphasic Vertebral direction of flow:        Known occlusion   Antegrade DUPLEX VELOCITIES (cm/sec) CCA peak systolic                   77                91 ECA peak systolic                   54                98 ICA peak systolic                   238               223 ICA end diastolic                   86                78 PLAQUE MORPHOLOGY:                  Mixed             Mixed, irregular PLAQUE AMOUNT:                      Moderate          Moderate PLAQUE LOCATION:                    ICA/CCA Bifurcation/ICA/CCA  IMPRESSION: 1. Bilateral 60-79% internal carotid artery stenosis. 2. Significant plaque throughout the left common carotid artery. 3. Increased velocity since prior study of 09/22/2011.  ___________________________________________ Janetta Hora. Fields, MD  SS/MEDQ  D:  04/29/2012  T:  04/29/2012  Job:  [id number]

## 2012-05-10 ENCOUNTER — Other Ambulatory Visit: Payer: Self-pay

## 2012-05-10 DIAGNOSIS — I6529 Occlusion and stenosis of unspecified carotid artery: Secondary | ICD-10-CM

## 2012-05-13 MED ORDER — SODIUM CHLORIDE 0.9 % IV SOLN
INTRAVENOUS | Status: DC
  Administered 2012-05-14: 08:00:00 via INTRAVENOUS

## 2012-05-14 ENCOUNTER — Telehealth: Payer: Self-pay | Admitting: Vascular Surgery

## 2012-05-14 ENCOUNTER — Encounter (HOSPITAL_COMMUNITY)
Admission: RE | Disposition: A | Discharge status: Home / Self Care | Payer: Self-pay | Source: Ambulatory Visit | Attending: Vascular Surgery

## 2012-05-14 ENCOUNTER — Ambulatory Visit (HOSPITAL_COMMUNITY)
Admission: RE | Admit: 2012-05-14 | Discharge: 2012-05-14 | Disposition: A | Discharge status: Home / Self Care | Payer: Medicare Other | Source: Ambulatory Visit | Attending: Vascular Surgery | Admitting: Vascular Surgery

## 2012-05-14 ENCOUNTER — Other Ambulatory Visit: Payer: Self-pay | Admitting: *Deleted

## 2012-05-14 DIAGNOSIS — R109 Unspecified abdominal pain: Secondary | ICD-10-CM | POA: Insufficient documentation

## 2012-05-14 DIAGNOSIS — K551 Chronic vascular disorders of intestine: Secondary | ICD-10-CM

## 2012-05-14 DIAGNOSIS — I2510 Atherosclerotic heart disease of native coronary artery without angina pectoris: Secondary | ICD-10-CM | POA: Insufficient documentation

## 2012-05-14 DIAGNOSIS — E785 Hyperlipidemia, unspecified: Secondary | ICD-10-CM | POA: Insufficient documentation

## 2012-05-14 DIAGNOSIS — I6529 Occlusion and stenosis of unspecified carotid artery: Secondary | ICD-10-CM

## 2012-05-14 DIAGNOSIS — M129 Arthropathy, unspecified: Secondary | ICD-10-CM | POA: Insufficient documentation

## 2012-05-14 DIAGNOSIS — K219 Gastro-esophageal reflux disease without esophagitis: Secondary | ICD-10-CM | POA: Insufficient documentation

## 2012-05-14 DIAGNOSIS — L408 Other psoriasis: Secondary | ICD-10-CM | POA: Insufficient documentation

## 2012-05-14 DIAGNOSIS — I10 Essential (primary) hypertension: Secondary | ICD-10-CM | POA: Insufficient documentation

## 2012-05-14 DIAGNOSIS — Z8546 Personal history of malignant neoplasm of prostate: Secondary | ICD-10-CM | POA: Insufficient documentation

## 2012-05-14 HISTORY — PX: VISCERAL ANGIOGRAM: SHX5515

## 2012-05-14 HISTORY — PX: OTHER SURGICAL HISTORY: SHX169

## 2012-05-14 LAB — POCT I-STAT, CHEM 8
BUN: 17 mg/dL (ref 6–23)
Calcium, Ion: 1.23 mmol/L (ref 1.12–1.32)
Chloride: 105 mEq/L (ref 96–112)
Creatinine, Ser: 1.1 mg/dL (ref 0.50–1.35)
Glucose, Bld: 115 mg/dL — ABNORMAL HIGH (ref 70–99)
HCT: 36 % — ABNORMAL LOW (ref 39.0–52.0)
Hemoglobin: 12.2 g/dL — ABNORMAL LOW (ref 13.0–17.0)
Potassium: 3.4 mEq/L — ABNORMAL LOW (ref 3.5–5.1)
Sodium: 141 mEq/L (ref 135–145)
TCO2: 26 mmol/L (ref 0–100)

## 2012-05-14 LAB — CBC
HCT: 36.2 % — ABNORMAL LOW (ref 39.0–52.0)
Hemoglobin: 12.4 g/dL — ABNORMAL LOW (ref 13.0–17.0)
MCH: 28.8 pg (ref 26.0–34.0)
MCHC: 34.3 g/dL (ref 30.0–36.0)
MCV: 84 fL (ref 78.0–100.0)
Platelets: 86 10*3/uL — ABNORMAL LOW (ref 150–400)
RBC: 4.31 MIL/uL (ref 4.22–5.81)
RDW: 13.6 % (ref 11.5–15.5)
WBC: 2.9 10*3/uL — ABNORMAL LOW (ref 4.0–10.5)

## 2012-05-14 SURGERY — VISCERAL ANGIOGRAM
Anesthesia: LOCAL

## 2012-05-14 MED ORDER — METOPROLOL TARTRATE 1 MG/ML IV SOLN: 2.0000 mg | INTRAVENOUS | Status: DC | PRN

## 2012-05-14 MED ORDER — LABETALOL HCL 5 MG/ML IV SOLN: 10.0000 mg | INTRAVENOUS | Status: DC | PRN

## 2012-05-14 MED ORDER — HYDRALAZINE HCL 20 MG/ML IJ SOLN: 10.0000 mg | INTRAMUSCULAR | Status: DC | PRN

## 2012-05-14 MED ORDER — MORPHINE SULFATE 10 MG/ML IJ SOLN: 2.0000 mg | INTRAMUSCULAR | Status: DC | PRN

## 2012-05-14 MED ORDER — LIDOCAINE HCL (PF) 1 % IJ SOLN
INTRAMUSCULAR | Status: AC
  Filled 2012-05-14: qty 30

## 2012-05-14 MED ORDER — ONDANSETRON HCL 4 MG/2ML IJ SOLN: 4.0000 mg | Freq: Four times a day (QID) | INTRAMUSCULAR | Status: DC | PRN

## 2012-05-14 MED ORDER — ACETAMINOPHEN 325 MG RE SUPP: 325.0000 mg | RECTAL | Status: DC | PRN

## 2012-05-14 MED ORDER — ACETAMINOPHEN 325 MG PO TABS: 325.0000 mg | ORAL_TABLET | ORAL | Status: DC | PRN

## 2012-05-14 MED ORDER — OXYCODONE HCL 5 MG PO TABS: 5.0000 mg | ORAL_TABLET | ORAL | Status: DC | PRN

## 2012-05-14 MED ORDER — HEPARIN (PORCINE) IN NACL 2-0.9 UNIT/ML-% IJ SOLN
INTRAMUSCULAR | Status: AC
  Filled 2012-05-14: qty 1000

## 2012-05-14 MED ORDER — DEXTROSE-NACL 5-0.45 % IV SOLN
INTRAVENOUS | Status: DC
  Administered 2012-05-14: 100 mL/h via INTRAVENOUS

## 2012-05-14 NOTE — Telephone Encounter (Signed)
Message copied by Fredrich Birks on Fri May 14, 2012 12:37 PM ------      Message from: Sherren Kerns      Created: Fri May 14, 2012 10:27 AM       Aortogram      1st order celiac      1st order SMA      Normal mesenterics no stenosis      Please forward to [facility] in [place] lab as well      Needs follow up carotid duplex and Rusty visit in 6 mon            [place]

## 2012-05-14 NOTE — Telephone Encounter (Signed)
lvm and sent letter re; November 2013 follow up, dpm

## 2012-05-14 NOTE — Interval H&P Note (Signed)
History and Physical Interval Note:  05/14/2012 7:51 AM  [name]  has presented today for surgery, with the diagnosis of Stenosis  The various methods of treatment have been discussed with the patient and family. After consideration of risks, benefits and other options for treatment, the patient has consented to  Procedure(s) (LRB): VISCERAL ANGIOGRAM (N/A) as a surgical intervention .  The patients' history has been reviewed, patient examined, no change in status, stable for surgery.  I have reviewed the patients' chart and labs.  Questions were answered to the patient's satisfaction.     Shreeya Recendiz E

## 2012-05-14 NOTE — Discharge Instructions (Signed)

## 2012-05-14 NOTE — H&P (View-Only) (Signed)
[facility] [place] HISTORY AND PHYSICAL    Is an 78 year old male that we have been following for bilateral asymptomatic moderate carotid stenosis. He returns today for routine followup of this. However, he is also referred by Dr. 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 He has had a one-month history of post prandial abdominal pain. He has had no weight loss. He had an upper endoscopy recently which showed some gastritis and a negative colonoscopy. He has previously had a small bowel obstruction. He has also previously had transabdominal, aortic aneurysm repair.  He describes the abdominal pain is crampy in nature. He does not have pain otherwise. He apparently did have some blood per rectum. He is overall very active and recently competed in the senior games also has a home gym he uses daily.  He denies any claudication symptoms. He denies any symptoms of TIA amaurosis or stroke.  Other medical problems include recent diagnosis of prostate cancer and he is considering radiation therapy. He also has multiple joint arthritis, hypertension, reflux, coronary disease, hyperlipidemia.  These are currently controlled.  He was placed on Carafate recently but really this has not improved his symptoms.   Past Medical History  Diagnosis Date  . Enlarged prostate 2008    Thermotherapy  . Arthritis   . Hypertension   . Psoriasis   . Gastroesophageal reflux disease   . Peripheral vascular disease   . CAD (coronary artery disease)   . Hyperlipidemia   . Adenomatous colon polyp   . Aortic aneurysm 1999    repair  . Carotid artery occlusion   . AAA (abdominal aortic aneurysm) 1999  . Cancer     prostate    Past Surgical History  Procedure Date  . Cholecystectomy   . Cardiac catheterization 10/2009  . Spine surgery 2009    ruptured disc  . Abdominal aortic aneurysm repair 199     Social History History  Substance Use Topics  . Smoking status: Former  Smoker    Types: Cigarettes    Quit date: 04/29/1997  . Smokeless tobacco: Never Used  . Alcohol Use: No    Family History Family History  Problem Relation Age of Onset  . Heart attack Father   . Cancer Sister   . Heart disease Brother     before age 88  . Heart attack Brother     Allergies  No Known Allergies   Current Outpatient Prescriptions  Medication Sig Dispense Refill  . aspirin 325 MG tablet Take 325 mg by mouth daily. 1/2 tablet daily      . felodipine (PLENDIL) 10 MG 24 hr tablet Take 10 mg by mouth daily.      . fish oil-omega-3 fatty acids 1000 MG capsule Take 2 g by mouth daily.      Marland Kitchen glucosamine-chondroitin 500-400 MG tablet Take 1 tablet by mouth 3 (three) times daily.      . Multiple Vitamin (MULTIVITAMIN) tablet Take 1 tablet by mouth daily.      Marland Kitchen omeprazole (PRILOSEC) 20 MG capsule Take 20 mg by mouth daily.      . rosuvastatin (CRESTOR) 20 MG tablet Take 20 mg by mouth daily.      . sucralfate (CARAFATE) 1 G tablet Take 1 g by mouth 2 (two) times daily.      . valsartan-hydrochlorothiazide (DIOVAN-HCT) 320-12.5 MG per tablet Take 1 tablet by mouth daily.      . calcium carbonate (TUMS - DOSED IN MG ELEMENTAL  CALCIUM) 500 MG chewable tablet Chew 1 tablet by mouth daily.        ROS:   General:  No weight loss, Fever, chills  HEENT: No recent headaches, no nasal bleeding, no visual changes, no sore throat  Neurologic: No dizziness, blackouts, seizures. No recent symptoms of stroke or mini- stroke. No recent episodes of slurred speech, or temporary blindness.  Cardiac: No recent episodes of chest pain/pressure, no shortness of breath at rest.  No shortness of breath with exertion.  Denies history of atrial fibrillation or irregular heartbeat  Vascular: No history of rest pain in feet.  No history of claudication.  No history of non-healing ulcer, No history of DVT   Pulmonary: No home oxygen, no productive cough, no hemoptysis,  No asthma or  wheezing  Musculoskeletal:  [x ] Arthritis, [ ]  Low back pain,  [ ]  Joint pain  Hematologic:No history of hypercoagulable state.  No history of easy bleeding.  No history of anemia  Gastrointestinal: + hematochezia or melena,  + gastroesophageal reflux, no trouble swallowing  Urinary: [ ]  chronic Kidney disease, [ ]  on HD - [ ]  MWF or [ ]  TTHS, [ ]  Burning with urination, [ ]  Frequent urination, [ ]  Difficulty urinating;   Skin: No rashes  Psychological: No history of anxiety,  No history of depression   Physical Examination  Filed Vitals:   04/29/12 1104 04/29/12 1106  BP: 179/78 177/85  Pulse: 62 62  Temp: 97.6 F (36.4 C)   TempSrc: Oral   Height: 5\' 10"  (1.778 m)   Weight: 165 lb (74.844 kg)     Body mass index is 23.68 kg/(m^2).  General:  Alert and oriented, no acute distress HEENT: Normal Neck: No bruit or JVD Pulmonary: Clear to auscultation bilaterally Cardiac: Regular Rate and Rhythm without murmur Abdomen: Soft, non-tender, non-distended, no mass, transverse abdominal scar, prominent aortic pulsation just left of the umbilicus, audible bruit epigastric Skin: No rash Extremity Pulses:  2+ radial, brachial, femoral bilaterally Musculoskeletal: No deformity or edema  Neurologic: Upper and lower extremity motor 5/5 and symmetric  DATA: He had bilateral carotid duplex exam today which again shows a 60-80% stenosis bilaterally with some left common carotid stenosis. However peak systolic velocities are still in the 200 range. This is minimally changed over the last 2 years. He also had an abdominal duplex exam which showed slightly increased velocities in the celiac but velocities greater than 350 cm/s superior mesenteric artery the IMA was not well visualized   ASSESSMENT:   History consistent with chronic mesenteric ischemia. 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 #2 stable bilateral moderate carotid stenosis   PLAN:   Mesenteric angiogram possible intervention on 05/14/2012 risks benefits possible complications procedure details including but limited to vessel injury mesenteric artery stenting bleeding infection contrast reaction were explained to the patient today. He understands and agrees to proceed  Fabienne Bruns, MD [facility] [place] Office: [phone] Pager: [phone]

## 2012-05-14 NOTE — Op Note (Signed)
Procedure: Abdominal aortogram with selective mesenteric angiogram  Preoperative diagnosis: Abdominal pain  Postoperative diagnosis: Same  Anesthesia: Local  Operative findings: #1 widely patent celiac artery no significant stenosis #2 widely patent superior mesenteric artery no significant stenosis #3 nonvisualization inferior mesenteric artery  Operative details: After obtaining informed consent, the patient was taken to the PV lab. The patient was placed in supine position the Angio table. Both groins were prepped and draped in usual sterile fashion. Local anesthesia was attributed of the right common femoral artery. An introducer needle was used to cannulate the right common femoral artery without difficulty. The 0.035 first core wire was threaded in the abdominal aorta under fluoroscopic guidance. A 6 French sheath placed over the guidewire the right common femoral artery. A 5 French pigtail catheter was then placed over the guidewire and the abdominal aorta and abdominal aortogram was obtained in the AP and lateral projection. The celiac and superior mesenteric arteries are patent at their origin. There is no significant origin stenosis. The inferior mesenteric artery is not visualized. The left and right renal arteries are patent. The infrarenal abdominal aorta is patent. There is no evidence of abdominal aortic or anastomotic pseudoaneurysm. The left and right common iliac arteries are patent.  At this point the 5 French pigtail catheter was removed over a guidewire and exchanged for a 5 [place] SOS catheter. The SOS catheter was used to selectively catheterize the celiac artery. AP and lateral projections of the selective catheterization were performed. The celiac artery is patent with no significant flow-limiting stenosis. All major branches are also patent.  The catheter was disengaged and the celiac artery and then the superior mesenteric artery was selectively catheterized. An AP and  lateral projection was performed through this superior mesenteric artery. The superior mesenteric artery is patent with no significant flow-limiting stenosis and all major branches are patent.  The SOS catheter was disengaged from the superior mesenteric artery and straightened over a guidewire. These were then both removed.  The 6 French sheath was thoroughly flushed with heparinized saline. This was left in place to be pulled in the holding area. The patient tolerated the procedure well and there were no complications. The patient was taken to the holding area in stable condition.  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 Based on the findings of his arteriogram I do not believe that the patient's abdominal pain is related to arterial occlusive disease of his mesenteric arteries.  Plan: The patient will continue our carotid surveillance protocol for his moderate carotid stenosis. He will followup on as-needed basis for his abdominal pain and was encouraged to continue to see his referring physician for further evaluation and workup of this.  Fabienne Bruns, MD [facility] [place] Office: [phone] Pager: [phone]

## 2012-05-22 ENCOUNTER — Ambulatory Visit: Payer: Self-pay | Admitting: Oncology

## 2012-05-24 ENCOUNTER — Other Ambulatory Visit: Payer: Self-pay | Admitting: *Deleted

## 2012-05-24 ENCOUNTER — Ambulatory Visit (INDEPENDENT_AMBULATORY_CARE_PROVIDER_SITE_OTHER): Payer: Medicare Other | Admitting: Surgery

## 2012-05-24 ENCOUNTER — Encounter (INDEPENDENT_AMBULATORY_CARE_PROVIDER_SITE_OTHER): Payer: Medicare Other | Admitting: *Deleted

## 2012-05-24 ENCOUNTER — Encounter: Payer: Self-pay | Admitting: Surgery

## 2012-05-24 VITALS — BP 129/76 | HR 67 | Resp 14 | Ht 70.0 in | Wt 163.2 lb

## 2012-05-24 DIAGNOSIS — R1031 Right lower quadrant pain: Secondary | ICD-10-CM

## 2012-05-24 DIAGNOSIS — K551 Chronic vascular disorders of intestine: Secondary | ICD-10-CM

## 2012-05-24 DIAGNOSIS — M79609 Pain in unspecified limb: Secondary | ICD-10-CM

## 2012-05-24 NOTE — Progress Notes (Signed)
[facility] [place]   Patient name: [name] MRN: [id number] DOB: [birth year] Sex: male     Chief Complaint  Patient presents with  . Mesenteric Insufficiency    SMA Visceral Angiogram of 05/14/12  . Groin Pain    Right groin area duration 2-3 day discomfort    HISTORY OF PRESENT ILLNESS: The patient comes in today as an add on, an unscheduled visit. He is a patient of Dr. Darrick Penna. He underwent abdominal angiography on May 26 via a 6 French sheath in the right groin to rule out mesenteric stenosis. He comes in today because of complaints of pain and ecchymosis in the right groin. He is also concerned because he has a history of low platelets.  Past Medical History  Diagnosis Date  . Enlarged prostate 2008    Thermotherapy  . Arthritis   . Hypertension   . Psoriasis   . Gastroesophageal reflux disease   . Peripheral vascular disease   . CAD (coronary artery disease)   . Hyperlipidemia   . Adenomatous colon polyp   . Aortic aneurysm 1999    repair  . Carotid artery occlusion   . AAA (abdominal aortic aneurysm) 1999  . Cancer     prostate    Past Surgical History  Procedure Date  . Cholecystectomy   . Cardiac catheterization 10/2009  . Spine surgery 2009    ruptured disc  . Visceral angiogram 05/14/2012    Right  groin area  . Abdominal aortic aneurysm repair 1999    History   Social History  . Marital Status: Married    Spouse Name: N/A    Number of Children: N/A  . Years of Education: N/A   Occupational History  . Not on file.   Social History Main Topics  . Smoking status: Former Smoker    Types: Cigarettes    Quit date: 04/29/1997  . Smokeless tobacco: Never Used  . Alcohol Use: No  . Drug Use: No  . Sexually Active:    Other Topics Concern  . Not on file   Social History Narrative  . No narrative on file    Family History  Problem Relation Age of Onset  . Heart attack Father   . Heart disease Father   . Cancer  Sister   . Heart disease Brother     before age 13  . Heart attack Brother     Allergies as of 05/24/2012  . (No Known Allergies)    Current Outpatient Prescriptions on File Prior to Visit  Medication Sig Dispense Refill  . aspirin EC 81 MG tablet Take 162 mg by mouth daily.      . felodipine (PLENDIL) 10 MG 24 hr tablet Take 10 mg by mouth daily.      . fish oil-omega-3 fatty acids 1000 MG capsule Take 2 g by mouth daily.      Marland Kitchen glucosamine-chondroitin 500-400 MG tablet Take 1 tablet by mouth 2 (two) times daily.       . Multiple Vitamin (MULTIVITAMIN) tablet Take 1 tablet by mouth daily.      Marland Kitchen omeprazole (PRILOSEC) 20 MG capsule Take 20 mg by mouth daily.      . rosuvastatin (CRESTOR) 20 MG tablet Take 20 mg by mouth daily.      . sucralfate (CARAFATE) 1 G tablet Take 1 g by mouth 2 (two) times daily.      . valsartan-hydrochlorothiazide (DIOVAN-HCT) 320-12.5 MG per tablet Take 1 tablet  by mouth daily.         REVIEW OF SYSTEMS: Please see history of present illness, otherwise no changes  PHYSICAL EXAMINATION:   Vital signs are BP 129/76  Pulse 67  Resp 14  Ht 5\' 10"  (1.778 m)  Wt 163 lb 3.2 oz (74.027 kg)  BMI 23.42 kg/m2  SpO2 99% General: The patient appears their stated age. HEENT:  No gross abnormalities Pulmonary:  Non labored breathing Abdomen: Soft and non-tender Musculoskeletal: There are no major deformities. Neurologic: No focal weakness or paresthesias are detected, Skin: There are no ulcer or rashes noted. Ecchymosis noted in the right groin Psychiatric: The patient has normal affect. Cardiovascular: Palpable right femoral pulse as well as right dorsalis pedis pulse.   Diagnostic Studies Duplex ultrasound was ordered and reviewed. It shows no evidence of hematoma, and no evidence of pseudoaneurysm.  Assessment: Right groin pain status post catheterization Plan: I have reassured the patient that I do not see any concerning findings on ultrasound  today. I think he is just tender from the procedure. I have given him clearance to return to normal activity. He'll call me with any other questions. Otherwise, he'll keep his scheduled visit with Dr. Darrick Penna  V. Charlena Cross, M.D. [facility] [place] Office: [phone] Pager:  [phone]

## 2012-05-27 DIAGNOSIS — C61 Malignant neoplasm of prostate: Secondary | ICD-10-CM | POA: Insufficient documentation

## 2012-06-07 NOTE — Procedures (Unsigned)
VASCULAR LAB EXAM  INDICATION:  Painful bruise knot, right groin, status post abdominal aortogram 05/14/2012.  HISTORY: Diabetes:  No. Cardiac:  No. Hypertension:  Yes.  EXAM:  Duplex imaging of the right groin reveals no hematoma or pseudoaneurysm.  IMPRESSION: 1. Normal exam. 2. No hematoma or pseudoaneurysm visualized.  ___________________________________________ V. Charlena Cross, MD  SS/MEDQ  D:  05/24/2012  T:  05/24/2012  Job:  [id number]

## 2012-06-21 ENCOUNTER — Ambulatory Visit: Payer: Self-pay | Admitting: Oncology

## 2012-07-22 ENCOUNTER — Ambulatory Visit: Payer: Self-pay | Admitting: Oncology

## 2012-07-22 DIAGNOSIS — C801 Malignant (primary) neoplasm, unspecified: Secondary | ICD-10-CM

## 2012-07-22 HISTORY — DX: Malignant (primary) neoplasm, unspecified: C80.1

## 2012-08-05 ENCOUNTER — Ambulatory Visit: Payer: Self-pay | Admitting: Radiation Oncology

## 2012-08-05 DIAGNOSIS — I2510 Atherosclerotic heart disease of native coronary artery without angina pectoris: Secondary | ICD-10-CM

## 2012-08-05 LAB — CBC WITH DIFFERENTIAL/PLATELET
Basophil #: 0 10*3/uL (ref 0.0–0.1)
Basophil %: 0.6 %
Eosinophil #: 0.1 10*3/uL (ref 0.0–0.7)
Eosinophil %: 3.1 %
HCT: 35.5 % — ABNORMAL LOW (ref 40.0–52.0)
HGB: 12.3 g/dL — ABNORMAL LOW (ref 13.0–18.0)
Lymphocyte #: 1.1 10*3/uL (ref 1.0–3.6)
Lymphocyte %: 24 %
MCH: 29.4 pg (ref 26.0–34.0)
MCHC: 34.7 g/dL (ref 32.0–36.0)
MCV: 85 fL (ref 80–100)
Monocyte #: 0.4 x10 3/mm (ref 0.2–1.0)
Monocyte %: 9.8 %
Neutrophil #: 2.8 10*3/uL (ref 1.4–6.5)
Neutrophil %: 62.5 %
Platelet: 98 10*3/uL — ABNORMAL LOW (ref 150–440)
RBC: 4.18 10*6/uL — ABNORMAL LOW (ref 4.40–5.90)
RDW: 14.4 % (ref 11.5–14.5)
WBC: 4.5 10*3/uL (ref 3.8–10.6)

## 2012-08-05 LAB — BASIC METABOLIC PANEL
Anion Gap: 7 (ref 7–16)
BUN: 16 mg/dL (ref 7–18)
Calcium, Total: 9.3 mg/dL (ref 8.5–10.1)
Chloride: 98 mmol/L (ref 98–107)
Co2: 30 mmol/L (ref 21–32)
Creatinine: 1.03 mg/dL (ref 0.60–1.30)
EGFR (African American): >60
EGFR (Non-African Amer.): >60
Glucose: 107 mg/dL — ABNORMAL HIGH (ref 65–99)
Osmolality: 272 (ref 275–301)
Potassium: 3.5 mmol/L (ref 3.5–5.1)
Sodium: 135 mmol/L — ABNORMAL LOW (ref 136–145)

## 2012-08-09 ENCOUNTER — Ambulatory Visit: Payer: Self-pay | Admitting: Oncology

## 2012-08-09 LAB — CBC CANCER CENTER
Basophil #: 0 x10 3/mm (ref 0.0–0.1)
Basophil %: 0.4 %
Eosinophil #: 0.1 x10 3/mm (ref 0.0–0.7)
Eosinophil %: 2.9 %
HCT: 35.2 % — ABNORMAL LOW (ref 40.0–52.0)
HGB: 11.9 g/dL — ABNORMAL LOW (ref 13.0–18.0)
Lymphocyte #: 0.9 x10 3/mm — ABNORMAL LOW (ref 1.0–3.6)
Lymphocyte %: 25.2 %
MCH: 29.3 pg (ref 26.0–34.0)
MCHC: 34 g/dL (ref 32.0–36.0)
MCV: 86 fL (ref 80–100)
Monocyte #: 0.4 x10 3/mm (ref 0.2–1.0)
Monocyte %: 10.5 %
Neutrophil #: 2.3 x10 3/mm (ref 1.4–6.5)
Neutrophil %: 61 %
Platelet: 96 x10 3/mm — ABNORMAL LOW (ref 150–440)
RBC: 4.07 10*6/uL — ABNORMAL LOW (ref 4.40–5.90)
RDW: 14.5 % (ref 11.5–14.5)
WBC: 3.7 x10 3/mm — ABNORMAL LOW (ref 3.8–10.6)

## 2012-08-18 ENCOUNTER — Ambulatory Visit: Payer: Self-pay | Admitting: Radiation Oncology

## 2012-08-22 ENCOUNTER — Ambulatory Visit: Payer: Self-pay | Admitting: Oncology

## 2012-09-21 ENCOUNTER — Ambulatory Visit: Payer: Self-pay | Admitting: Oncology

## 2012-11-15 ENCOUNTER — Other Ambulatory Visit: Payer: Medicare Other

## 2012-11-15 ENCOUNTER — Ambulatory Visit: Payer: Medicare Other | Admitting: Neurosurgery

## 2012-12-09 ENCOUNTER — Ambulatory Visit: Payer: Self-pay | Admitting: Oncology

## 2012-12-13 DIAGNOSIS — Z85828 Personal history of other malignant neoplasm of skin: Secondary | ICD-10-CM | POA: Insufficient documentation

## 2012-12-14 DIAGNOSIS — C4491 Basal cell carcinoma of skin, unspecified: Secondary | ICD-10-CM

## 2012-12-14 HISTORY — DX: Basal cell carcinoma of skin, unspecified: C44.91

## 2012-12-17 LAB — PSA: PSA: <0.1 ng/mL (ref 0.0–4.0)

## 2012-12-22 ENCOUNTER — Ambulatory Visit: Payer: Self-pay | Admitting: Oncology

## 2013-01-21 ENCOUNTER — Encounter: Payer: Self-pay | Admitting: Neurosurgery

## 2013-01-24 ENCOUNTER — Ambulatory Visit: Payer: Medicare Other | Admitting: Neurosurgery

## 2013-01-24 ENCOUNTER — Other Ambulatory Visit: Payer: Medicare Other

## 2013-02-07 ENCOUNTER — Ambulatory Visit: Payer: Self-pay | Admitting: Oncology

## 2013-02-08 LAB — CBC CANCER CENTER
Basophil #: 0 x10 3/mm (ref 0.0–0.1)
Basophil %: 0.8 %
Eosinophil #: 0.1 x10 3/mm (ref 0.0–0.7)
Eosinophil %: 3.1 %
HCT: 34.2 % — ABNORMAL LOW (ref 40.0–52.0)
HGB: 11.9 g/dL — ABNORMAL LOW (ref 13.0–18.0)
Lymphocyte #: 0.9 x10 3/mm — ABNORMAL LOW (ref 1.0–3.6)
Lymphocyte %: 24 %
MCH: 29.2 pg (ref 26.0–34.0)
MCHC: 34.8 g/dL (ref 32.0–36.0)
MCV: 84 fL (ref 80–100)
Monocyte #: 0.5 x10 3/mm (ref 0.2–1.0)
Monocyte %: 12.5 %
Neutrophil #: 2.3 x10 3/mm (ref 1.4–6.5)
Neutrophil %: 59.6 %
Platelet: 104 x10 3/mm — ABNORMAL LOW (ref 150–440)
RBC: 4.08 10*6/uL — ABNORMAL LOW (ref 4.40–5.90)
RDW: 14.4 % (ref 11.5–14.5)
WBC: 3.8 x10 3/mm (ref 3.8–10.6)

## 2013-02-19 ENCOUNTER — Ambulatory Visit: Payer: Self-pay | Admitting: Oncology

## 2013-02-22 ENCOUNTER — Encounter: Payer: Self-pay | Admitting: Vascular Surgery

## 2013-02-23 ENCOUNTER — Ambulatory Visit (INDEPENDENT_AMBULATORY_CARE_PROVIDER_SITE_OTHER): Payer: Medicare Other | Admitting: Neurosurgery

## 2013-02-23 ENCOUNTER — Encounter: Payer: Self-pay | Admitting: Neurosurgery

## 2013-02-23 ENCOUNTER — Other Ambulatory Visit (INDEPENDENT_AMBULATORY_CARE_PROVIDER_SITE_OTHER): Payer: Medicare Other | Admitting: *Deleted

## 2013-02-23 DIAGNOSIS — I6529 Occlusion and stenosis of unspecified carotid artery: Secondary | ICD-10-CM

## 2013-02-23 NOTE — Progress Notes (Signed)
[facility] [place] Carotid Office Note  CC: Carotid surveillance Referring Physician: Fields  History of Present Illness: 79 year old male patient of Dr. Darrick Penna followed for known carotid stenosis. The patient denies any signs or symptoms of CVA, TIA, amaurosis fugax. The patient denies any new abdominal problems.  Past Medical History  Diagnosis Date  . Enlarged prostate 2008    Thermotherapy  . Arthritis   . Hypertension   . Psoriasis   . Gastroesophageal reflux disease   . Peripheral vascular disease   . CAD (coronary artery disease)   . Hyperlipidemia   . Adenomatous colon polyp   . Aortic aneurysm 1999    repair  . AAA (abdominal aortic aneurysm) 1999  . Carotid artery occlusion   . Cancer Aug. 2013    prostate Seeding  implant  . Basal cell cancer Dec. 24, 2013    Nose    ROS: [x]  Positive   [ ]  Denies    General: [ ]  Weight loss, [ ]  Fever, [ ]  chills Neurologic: [ ]  Dizziness, [ ]  Blackouts, [ ]  Seizure [ ]  Stroke, [ ]  "Mini stroke", [ ]  Slurred speech, [ ]  Temporary blindness; [ ]  weakness in arms or legs, [ ]  Hoarseness Cardiac: [ ]  Chest pain/pressure, [ ]  Shortness of breath at rest [ ]  Shortness of breath with exertion, [ ]  Atrial fibrillation or irregular heartbeat Vascular: [ ]  Pain in legs with walking, [ ]  Pain in legs at rest, [ ]  Pain in legs at night,  [ ]  Non-healing ulcer, [ ]  Blood clot in vein/DVT,   Pulmonary: [ ]  Home oxygen, [ ]  Productive cough, [ ]  Coughing up blood, [ ]  Asthma,  [ ]  Wheezing Musculoskeletal:  [ ]  Arthritis, [ ]  Low back pain, [ ]  Joint pain Hematologic: [ ]  Easy Bruising, [ ]  Anemia; [ ]  Hepatitis Gastrointestinal: [ ]  Blood in stool, [ ]  Gastroesophageal Reflux/heartburn, [ ]  Trouble swallowing Urinary: [ ]  chronic Kidney disease, [ ]  on HD - [ ]  MWF or [ ]  TTHS, [ ]  Burning with urination, [ ]  Difficulty urinating Skin: [ ]  Rashes, [ ]  Wounds Psychological: [ ]  Anxiety, [ ]  Depression   Social  History History  Substance Use Topics  . Smoking status: Former Smoker    Types: Cigarettes    Quit date: 04/29/1997  . Smokeless tobacco: Never Used  . Alcohol Use: No    Family History Family History  Problem Relation Age of Onset  . Heart attack Father   . Heart disease Father   . COPD Father   . Cancer Sister   . Heart disease Brother     before age 55  . Heart attack Brother     No Known Allergies  Current Outpatient Prescriptions  Medication Sig Dispense Refill  . aspirin EC 81 MG tablet Take 162 mg by mouth daily.      . felodipine (PLENDIL) 10 MG 24 hr tablet Take 10 mg by mouth daily.      . fish oil-omega-3 fatty acids 1000 MG capsule Take 2 g by mouth daily.      Marland Kitchen glucosamine-chondroitin 500-400 MG tablet Take 1 tablet by mouth 2 (two) times daily.       . Multiple Vitamin (MULTIVITAMIN) tablet Take 1 tablet by mouth daily.      Marland Kitchen omeprazole (PRILOSEC) 20 MG capsule Take 20 mg by mouth daily.      . rosuvastatin (CRESTOR) 20 MG tablet Take 20 mg  by mouth daily.      . sucralfate (CARAFATE) 1 G tablet Take 1 g by mouth 2 (two) times daily.      . valsartan-hydrochlorothiazide (DIOVAN-HCT) 320-12.5 MG per tablet Take 1 tablet by mouth daily.       No current facility-administered medications for this visit.    Physical Examination  Filed Vitals:   02/23/13 1423  BP: 148/84  Pulse: 56  Resp:     Body mass index is 22.96 kg/(m^2).  General:  WDWN in NAD Gait: Normal HEENT: WNL Eyes: Pupils equal Pulmonary: normal non-labored breathing , without Rales, rhonchi,  wheezing Cardiac: RRR, without  Murmurs, rubs or gallops; Abdomen: soft, NT, no masses Skin: no rashes, ulcers noted  Vascular Exam Pulses: 3+ radial pulses bilaterally Carotid bruits: Carotid pulses to auscultation Extremities without ischemic changes, no Gangrene , no cellulitis; no open wounds;  Musculoskeletal: no muscle wasting or atrophy   Neurologic: A&O X 3; Appropriate Affect ;  SENSATION: normal; MOTOR FUNCTION:  moving all extremities equally. Speech is fluent/normal  Non-Invasive Vascular Imaging CAROTID DUPLEX 02/23/2013  Right ICA 40 - 59 % stenosis Left ICA 40 - 59 % stenosis   ASSESSMENT/PLAN: Asymptomatic stable carotid patient that will followup in one year with repeat carotid duplex. Velocities are not as elevated today as previous exam. The patient's questions were encouraged and answered, he is in agreement with this plan.  Lauree Chandler ANP   Clinic MD: Edilia Bo

## 2013-02-28 NOTE — Addendum Note (Signed)
Addended by: Sharee Pimple on: 02/28/2013 09:28 AM   Modules accepted: Orders

## 2013-08-12 ENCOUNTER — Ambulatory Visit: Payer: Self-pay | Admitting: Oncology

## 2013-08-15 LAB — CBC CANCER CENTER
Basophil #: 0 x10 3/mm (ref 0.0–0.1)
Basophil %: 0.6 %
Eosinophil #: 0 x10 3/mm (ref 0.0–0.7)
Eosinophil %: 1.8 %
HCT: 36.4 % — ABNORMAL LOW (ref 40.0–52.0)
HGB: 12.7 g/dL — ABNORMAL LOW (ref 13.0–18.0)
Lymphocyte #: 0.6 x10 3/mm — ABNORMAL LOW (ref 1.0–3.6)
Lymphocyte %: 23.4 %
MCH: 30 pg (ref 26.0–34.0)
MCHC: 35.1 g/dL (ref 32.0–36.0)
MCV: 86 fL (ref 80–100)
Monocyte #: 0.3 x10 3/mm (ref 0.2–1.0)
Monocyte %: 9.9 %
Neutrophil #: 1.6 x10 3/mm (ref 1.4–6.5)
Neutrophil %: 64.3 %
Platelet: 72 x10 3/mm — ABNORMAL LOW (ref 150–440)
RBC: 4.25 10*6/uL — ABNORMAL LOW (ref 4.40–5.90)
RDW: 14.2 % (ref 11.5–14.5)
WBC: 2.6 x10 3/mm — ABNORMAL LOW (ref 3.8–10.6)

## 2013-08-22 ENCOUNTER — Ambulatory Visit: Payer: Self-pay | Admitting: Oncology

## 2013-11-15 ENCOUNTER — Ambulatory Visit: Payer: Self-pay | Admitting: Oncology

## 2013-11-15 LAB — CBC CANCER CENTER
Basophil #: 0 x10 3/mm (ref 0.0–0.1)
Basophil %: 1.1 %
Eosinophil #: 0 x10 3/mm (ref 0.0–0.7)
Eosinophil %: 1.1 %
HCT: 37 % — ABNORMAL LOW (ref 40.0–52.0)
HGB: 12.7 g/dL — ABNORMAL LOW (ref 13.0–18.0)
Lymphocyte #: 1.1 x10 3/mm (ref 1.0–3.6)
Lymphocyte %: 29.8 %
MCH: 29.3 pg (ref 26.0–34.0)
MCHC: 34.3 g/dL (ref 32.0–36.0)
MCV: 85 fL (ref 80–100)
Monocyte #: 0.4 x10 3/mm (ref 0.2–1.0)
Monocyte %: 11.9 %
Neutrophil #: 2 x10 3/mm (ref 1.4–6.5)
Neutrophil %: 56.1 %
Platelet: 72 x10 3/mm — ABNORMAL LOW (ref 150–440)
RBC: 4.33 10*6/uL — ABNORMAL LOW (ref 4.40–5.90)
RDW: 14.4 % (ref 11.5–14.5)
WBC: 3.6 x10 3/mm — ABNORMAL LOW (ref 3.8–10.6)

## 2013-11-21 ENCOUNTER — Ambulatory Visit: Payer: Self-pay | Admitting: Oncology

## 2014-01-20 ENCOUNTER — Other Ambulatory Visit: Payer: Self-pay | Admitting: Neurosurgery

## 2014-01-20 DIAGNOSIS — I6529 Occlusion and stenosis of unspecified carotid artery: Secondary | ICD-10-CM

## 2014-02-23 ENCOUNTER — Other Ambulatory Visit (HOSPITAL_COMMUNITY): Payer: Medicare Other

## 2014-02-23 ENCOUNTER — Ambulatory Visit: Payer: Self-pay | Admitting: Oncology

## 2014-02-23 ENCOUNTER — Ambulatory Visit: Payer: Medicare Other | Admitting: Family

## 2014-02-23 LAB — CBC CANCER CENTER
Basophil #: 0 x10 3/mm (ref 0.0–0.1)
Basophil %: 0.6 %
Eosinophil #: 0.2 x10 3/mm (ref 0.0–0.7)
Eosinophil %: 5.7 %
HCT: 33.9 % — ABNORMAL LOW (ref 40.0–52.0)
HGB: 11.6 g/dL — ABNORMAL LOW (ref 13.0–18.0)
Lymphocyte #: 1 x10 3/mm (ref 1.0–3.6)
Lymphocyte %: 34.2 %
MCH: 29.5 pg (ref 26.0–34.0)
MCHC: 34.2 g/dL (ref 32.0–36.0)
MCV: 86 fL (ref 80–100)
Monocyte #: 0.4 x10 3/mm (ref 0.2–1.0)
Monocyte %: 13.4 %
Neutrophil #: 1.4 x10 3/mm (ref 1.4–6.5)
Neutrophil %: 46.1 %
Platelet: 71 x10 3/mm — ABNORMAL LOW (ref 150–440)
RBC: 3.94 10*6/uL — ABNORMAL LOW (ref 4.40–5.90)
RDW: 15.2 % — ABNORMAL HIGH (ref 11.5–14.5)
WBC: 3 x10 3/mm — ABNORMAL LOW (ref 3.8–10.6)

## 2014-03-22 ENCOUNTER — Ambulatory Visit: Payer: Self-pay | Admitting: Oncology

## 2014-03-29 ENCOUNTER — Other Ambulatory Visit: Payer: Self-pay | Admitting: Orthopedic Surgery

## 2014-03-29 DIAGNOSIS — N281 Cyst of kidney, acquired: Secondary | ICD-10-CM

## 2014-03-31 ENCOUNTER — Ambulatory Visit
Admission: RE | Admit: 2014-03-31 | Discharge: 2014-03-31 | Disposition: A | Discharge status: Home / Self Care | Payer: Medicare Other | Source: Ambulatory Visit | Attending: Orthopedic Surgery | Admitting: Orthopedic Surgery

## 2014-03-31 DIAGNOSIS — N281 Cyst of kidney, acquired: Secondary | ICD-10-CM

## 2014-05-26 ENCOUNTER — Ambulatory Visit: Payer: Self-pay | Admitting: Oncology

## 2014-05-26 LAB — CBC CANCER CENTER
Basophil #: 0 x10 3/mm (ref 0.0–0.1)
Basophil %: 0.7 %
Eosinophil #: 0.1 x10 3/mm (ref 0.0–0.7)
Eosinophil %: 2.3 %
HCT: 35.4 % — ABNORMAL LOW (ref 40.0–52.0)
HGB: 12.2 g/dL — ABNORMAL LOW (ref 13.0–18.0)
Lymphocyte #: 0.8 x10 3/mm — ABNORMAL LOW (ref 1.0–3.6)
Lymphocyte %: 29.2 %
MCH: 29.9 pg (ref 26.0–34.0)
MCHC: 34.5 g/dL (ref 32.0–36.0)
MCV: 87 fL (ref 80–100)
Monocyte #: 0.3 x10 3/mm (ref 0.2–1.0)
Monocyte %: 10 %
Neutrophil #: 1.6 x10 3/mm (ref 1.4–6.5)
Neutrophil %: 57.8 %
Platelet: 71 x10 3/mm — ABNORMAL LOW (ref 150–440)
RBC: 4.09 10*6/uL — ABNORMAL LOW (ref 4.40–5.90)
RDW: 14.5 % (ref 11.5–14.5)
WBC: 2.8 x10 3/mm — ABNORMAL LOW (ref 3.8–10.6)

## 2014-06-01 ENCOUNTER — Other Ambulatory Visit (HOSPITAL_COMMUNITY): Payer: Medicare Other

## 2014-06-01 ENCOUNTER — Ambulatory Visit: Payer: Medicare Other | Admitting: Family

## 2014-06-21 ENCOUNTER — Ambulatory Visit: Payer: Self-pay | Admitting: Oncology

## 2014-07-14 ENCOUNTER — Other Ambulatory Visit (HOSPITAL_COMMUNITY): Payer: Medicare Other

## 2014-07-14 ENCOUNTER — Ambulatory Visit: Payer: Medicare Other | Admitting: Family

## 2014-08-29 ENCOUNTER — Ambulatory Visit: Payer: Self-pay | Admitting: Oncology

## 2014-08-29 LAB — CBC CANCER CENTER
Basophil #: 0 x10 3/mm (ref 0.0–0.1)
Basophil %: 0.3 %
Eosinophil #: 0 x10 3/mm (ref 0.0–0.7)
Eosinophil %: 1 %
HCT: 37.6 % — ABNORMAL LOW (ref 40.0–52.0)
HGB: 12.8 g/dL — ABNORMAL LOW (ref 13.0–18.0)
Lymphocyte #: 0.9 x10 3/mm — ABNORMAL LOW (ref 1.0–3.6)
Lymphocyte %: 33.9 %
MCH: 29.5 pg (ref 26.0–34.0)
MCHC: 33.9 g/dL (ref 32.0–36.0)
MCV: 87 fL (ref 80–100)
Monocyte #: 0.3 x10 3/mm (ref 0.2–1.0)
Monocyte %: 9.5 %
Neutrophil #: 1.5 x10 3/mm (ref 1.4–6.5)
Neutrophil %: 55.3 %
Platelet: 68 x10 3/mm — ABNORMAL LOW (ref 150–440)
RBC: 4.32 10*6/uL — ABNORMAL LOW (ref 4.40–5.90)
RDW: 14.1 % (ref 11.5–14.5)
WBC: 2.8 x10 3/mm — ABNORMAL LOW (ref 3.8–10.6)

## 2014-09-01 ENCOUNTER — Other Ambulatory Visit (HOSPITAL_COMMUNITY): Payer: Medicare Other

## 2014-09-01 ENCOUNTER — Ambulatory Visit: Payer: Medicare Other | Admitting: Family

## 2014-09-21 ENCOUNTER — Ambulatory Visit: Payer: Self-pay | Admitting: Oncology

## 2014-10-31 ENCOUNTER — Encounter: Payer: Self-pay | Admitting: Family

## 2014-11-01 ENCOUNTER — Ambulatory Visit: Payer: Medicare Other | Admitting: Family

## 2014-11-01 ENCOUNTER — Other Ambulatory Visit (HOSPITAL_COMMUNITY): Payer: Medicare Other

## 2014-11-28 ENCOUNTER — Ambulatory Visit: Payer: Self-pay | Admitting: Oncology

## 2014-11-28 LAB — CBC CANCER CENTER
Basophil #: 0 x10 3/mm (ref 0.0–0.1)
Basophil %: 0.6 %
Eosinophil #: 0.2 x10 3/mm (ref 0.0–0.7)
Eosinophil %: 5.1 %
HCT: 36.3 % — ABNORMAL LOW (ref 40.0–52.0)
HGB: 12.4 g/dL — ABNORMAL LOW (ref 13.0–18.0)
Lymphocyte #: 0.9 x10 3/mm — ABNORMAL LOW (ref 1.0–3.6)
Lymphocyte %: 27.2 %
MCH: 29.4 pg (ref 26.0–34.0)
MCHC: 34.1 g/dL (ref 32.0–36.0)
MCV: 86 fL (ref 80–100)
Monocyte #: 0.5 x10 3/mm (ref 0.2–1.0)
Monocyte %: 14.4 %
Neutrophil #: 1.7 x10 3/mm (ref 1.4–6.5)
Neutrophil %: 52.7 %
Platelet: 57 x10 3/mm — ABNORMAL LOW (ref 150–440)
RBC: 4.22 10*6/uL — ABNORMAL LOW (ref 4.40–5.90)
RDW: 14.6 % — ABNORMAL HIGH (ref 11.5–14.5)
WBC: 3.2 x10 3/mm — ABNORMAL LOW (ref 3.8–10.6)

## 2014-11-30 ENCOUNTER — Encounter (HOSPITAL_COMMUNITY): Payer: Self-pay | Admitting: Vascular Surgery

## 2014-12-05 ENCOUNTER — Ambulatory Visit: Payer: Medicare Other | Admitting: Family

## 2014-12-05 ENCOUNTER — Other Ambulatory Visit (HOSPITAL_COMMUNITY): Payer: Medicare Other

## 2014-12-22 ENCOUNTER — Ambulatory Visit: Payer: Self-pay | Admitting: Oncology

## 2014-12-25 ENCOUNTER — Ambulatory Visit: Payer: Medicare Other | Admitting: Family

## 2014-12-25 ENCOUNTER — Other Ambulatory Visit (HOSPITAL_COMMUNITY): Payer: Medicare Other

## 2015-01-18 ENCOUNTER — Encounter: Payer: Self-pay | Admitting: Family

## 2015-01-22 ENCOUNTER — Encounter: Payer: Self-pay | Admitting: Family

## 2015-01-22 ENCOUNTER — Other Ambulatory Visit: Payer: Self-pay | Admitting: *Deleted

## 2015-01-22 ENCOUNTER — Ambulatory Visit (HOSPITAL_COMMUNITY)
Admission: RE | Admit: 2015-01-22 | Discharge: 2015-01-22 | Disposition: A | Discharge status: Home / Self Care | Payer: Medicare Other | Source: Ambulatory Visit | Attending: Family | Admitting: Family

## 2015-01-22 ENCOUNTER — Ambulatory Visit (INDEPENDENT_AMBULATORY_CARE_PROVIDER_SITE_OTHER): Payer: Medicare Other | Admitting: Family

## 2015-01-22 VITALS — BP 130/62 | HR 63 | Resp 16 | Ht 70.0 in | Wt 169.0 lb

## 2015-01-22 DIAGNOSIS — I6529 Occlusion and stenosis of unspecified carotid artery: Secondary | ICD-10-CM | POA: Insufficient documentation

## 2015-01-22 DIAGNOSIS — I6523 Occlusion and stenosis of bilateral carotid arteries: Secondary | ICD-10-CM

## 2015-01-22 DIAGNOSIS — I6501 Occlusion and stenosis of right vertebral artery: Secondary | ICD-10-CM

## 2015-01-22 DIAGNOSIS — Z87891 Personal history of nicotine dependence: Secondary | ICD-10-CM

## 2015-01-22 NOTE — Patient Instructions (Signed)
Stroke Prevention Some medical conditions and behaviors are associated with an increased chance of having a stroke. You may prevent a stroke by making healthy choices and managing medical conditions. HOW CAN I REDUCE MY RISK OF HAVING A STROKE?   Stay physically active. Get at least 30 minutes of activity on most or all days.  Do not smoke. It may also be helpful to avoid exposure to secondhand smoke.  Limit alcohol use. Moderate alcohol use is considered to be:  No more than 2 drinks per day for men.  No more than 1 drink per day for nonpregnant women.  Eat healthy foods. This involves:  Eating 5 or more servings of fruits and vegetables a day.  Making dietary changes that address high blood pressure (hypertension), high cholesterol, diabetes, or obesity.  Manage your cholesterol levels.  Making food choices that are high in fiber and low in saturated fat, trans fat, and cholesterol may control cholesterol levels.  Take any prescribed medicines to control cholesterol as directed by your health care provider.  Manage your diabetes.  Controlling your carbohydrate and sugar intake is recommended to manage diabetes.  Take any prescribed medicines to control diabetes as directed by your health care provider.  Control your hypertension.  Making food choices that are low in salt (sodium), saturated fat, trans fat, and cholesterol is recommended to manage hypertension.  Take any prescribed medicines to control hypertension as directed by your health care provider.  Maintain a healthy weight.  Reducing calorie intake and making food choices that are low in sodium, saturated fat, trans fat, and cholesterol are recommended to manage weight.  Stop drug abuse.  Avoid taking birth control pills.  Talk to your health care provider about the risks of taking birth control pills if you are over 35 years old, smoke, get migraines, or have ever had a blood clot.  Get evaluated for sleep  disorders (sleep apnea).  Talk to your health care provider about getting a sleep evaluation if you snore a lot or have excessive sleepiness.  Take medicines only as directed by your health care provider.  For some people, aspirin or blood thinners (anticoagulants) are helpful in reducing the risk of forming abnormal blood clots that can lead to stroke. If you have the irregular heart rhythm of atrial fibrillation, you should be on a blood thinner unless there is a good reason you cannot take them.  Understand all your medicine instructions.  Make sure that other conditions (such as anemia or atherosclerosis) are addressed. SEEK IMMEDIATE MEDICAL CARE IF:   You have sudden weakness or numbness of the face, arm, or leg, especially on one side of the body.  Your face or eyelid droops to one side.  You have sudden confusion.  You have trouble speaking (aphasia) or understanding.  You have sudden trouble seeing in one or both eyes.  You have sudden trouble walking.  You have dizziness.  You have a loss of balance or coordination.  You have a sudden, severe headache with no known cause.  You have new chest pain or an irregular heartbeat. Any of these symptoms may represent a serious problem that is an emergency. Do not wait to see if the symptoms will go away. Get medical help at once. Call your local emergency services (911 in [place] Do not drive yourself to the hospital. Document Released: 01/15/2005 Document Revised: 04/24/2014 Document Reviewed: 06/10/2013 Exit[employer] Patient Information 2015 [employer]. This information is not intended to replace advice given   to you by your health care provider. Make sure you discuss any questions you have with your health care provider.  

## 2015-01-22 NOTE — Progress Notes (Signed)
Established Carotid Patient   History of Present Illness  [name] is a 81 y.o. male patient of Dr. Oneida Alar who returns for follow up evaluation of known right vertebral artery occlusion and mild/moderate bilateral ICA stenoses. He returns today for follow up.  Patient has not had previous carotid artery intervention.  The patient denies any history of TIA or stroke symptoms, specifically the patient denies a history of amaurosis fugax or monocular blindness, denies a history unilateral  of facial drooping, denies a history of hemiplegia, and denies a history of receptive or expressive aphasia.    The patient denies New Medical or Surgical History.  Pt denies claudication symptoms with walking. He exercises regularly and participates in senior competitions.   Pt Diabetic: No Pt smoker: former smoker, quit in the 1990's  Pt meds include: Statin : Yes ASA: Yes Other anticoagulants/antiplatelets: no   Past Medical History  Diagnosis Date  . Enlarged prostate 2008    Thermotherapy  . Arthritis   . Hypertension   . Psoriasis   . Gastroesophageal reflux disease   . Peripheral vascular disease   . CAD (coronary artery disease)   . Hyperlipidemia   . Adenomatous colon polyp   . Aortic aneurysm 1999    repair  . AAA (abdominal aortic aneurysm) 1999  . Carotid artery occlusion   . Cancer Aug. 2013    prostate Seeding  implant  . Basal cell cancer Dec. 24, 2013    Nose    Social History History  Substance Use Topics  . Smoking status: Former Smoker    Types: Cigarettes    Quit date: 04/29/1997  . Smokeless tobacco: Never Used  . Alcohol Use: No    Family History Family History  Problem Relation Age of Onset  . Heart attack Father   . Heart disease Father     After age 78  . COPD Father   . Cancer Sister     Breast and Brain  . Heart disease Brother     before age 12  . Heart attack Brother     Surgical History Past Surgical History  Procedure  Laterality Date  . Cholecystectomy    . Cardiac catheterization  10/2009  . Spine surgery  2009    ruptured disc  . Visceral angiogram  05/14/2012    Right  groin area  . Abdominal aortic aneurysm repair  1999  . Visceral angiogram N/A 05/14/2012    Procedure: VISCERAL ANGIOGRAM;  Surgeon: Elam Dutch, MD;  Location: [facility] C[facility] LAB;  Service: Cardiovascular;  Laterality: N/A;    No Known Allergies  Current Outpatient Prescriptions  Medication Sig Dispense Refill  . aspirin EC 81 MG tablet Take 81 mg by mouth daily.     . felodipine (PLENDIL) 10 MG 24 hr tablet Take 10 mg by mouth daily.    Marland Kitchen glucosamine-chondroitin 500-400 MG tablet Take 1 tablet by mouth 2 (two) times daily.     . Multiple Vitamin (MULTIVITAMIN) tablet Take 1 tablet by mouth daily.    . rosuvastatin (CRESTOR) 20 MG tablet Take 20 mg by mouth daily.    . valsartan-hydrochlorothiazide (DIOVAN-HCT) 320-12.5 MG per tablet Take 1 tablet by mouth daily.    . fish oil-omega-3 fatty acids 1000 MG capsule Take 2 g by mouth daily.    Marland Kitchen omeprazole (PRILOSEC) 20 MG capsule Take 20 mg by mouth daily.    . sucralfate (CARAFATE) 1 G tablet Take 1 g by mouth 2 (two) times  daily.     No current facility-administered medications for this visit.    Review of Systems : See HPI for pertinent positives and negatives.  Physical Examination  Filed Vitals:   01/22/15 1407 01/22/15 1411  BP: 136/80 130/62  Pulse: 63 63  Resp:  16  Height:  5\' 10"  (1.778 m)  Weight:  169 lb (76.658 kg)  SpO2:  99%   Body mass index is 24.25 kg/(m^2).  General: WDWN male in NAD GAIT: normal Eyes: PERRLA Pulmonary:  Non-labored, CTAB, Negative  Rales, Negative rhonchi, & Negative wheezing.  Cardiac: regular Rhythm, no detected murmur.  VASCULAR EXAM Carotid Bruits Right Left   Negative Negative    Aorta is not palpable. Radial pulses are 2+ palpable and equal.                                                                                                                             LE Pulses Right Left       POPLITEAL  not palpable   not palpable    Gastrointestinal: soft, nontender, BS WNL, no r/g,  negative palpated masses.  Musculoskeletal: Negative muscle atrophy/wasting. M/S 5/5 throughout, Extremities without ischemic changes.  Neurologic: A&O X 3; Appropriate Affect, Speech is normal CN 2-12 intact except is slightly hard of hearing, Pain and light touch intact in extremities, Motor exam as listed above.   Non-Invasive Vascular Imaging CAROTID DUPLEX 01/22/2015   CEREBROVASCULAR DUPLEX EVALUATION    INDICATION: Carotid artery disease     PREVIOUS INTERVENTION(S):     DUPLEX EXAM:     RIGHT  LEFT  Peak Systolic Velocities (cm/s) End Diastolic Velocities (cm/s) Plaque LOCATION Peak Systolic Velocities (cm/s) End Diastolic Velocities (cm/s) Plaque  77 16 HT CCA PROXIMAL 62 18 HT  56 11 HT CCA MID 84 16 HT  49 14 HT CCA DISTAL 101 22 HT  69 7 HT ECA 112 20 HT  137 40 HT ICA PROXIMAL 61 15 HT  161 53 HT ICA MID 195 55 HT  119 19  ICA DISTAL 185 47     2.87 ICA / CCA Ratio (PSV) 2.32  Known occlusion Vertebral Flow Antegrade   093 Brachial Systolic Pressure (mmHg) 235  Triphasic  Brachial Artery Waveforms Triphasic     Plaque Morphology:  HM = Homogeneous, HT = Heterogeneous, CP = Calcific Plaque, SP = Smooth Plaque, IP = Irregular Plaque     ADDITIONAL FINDINGS:     IMPRESSION: Evidence of 40-59% bilateral internal carotid artery stenosis. Known occlusion of the right vertebral artery. Common carotid artery disease present bilaterally.    Compared to the previous exam:  No significant change in comparison to the last exam on 02/23/2013.      Assessment: [name] is a 81 y.o. male who presents with asymptomatic 40-59% bilateral internal carotid artery stenosis and known occlusion of the right vertebral artery. No significant change in comparison to the last exam on  02/23/2013. Healthy  and fit 81 year old male with excellent exercise habits.   Plan: Follow-up in 1 year with Carotid Duplex.   I discussed in depth with the patient the nature of atherosclerosis, and emphasized the importance of maximal medical management including strict control of blood pressure, blood glucose, and lipid levels, obtaining regular exercise, and continued cessation of smoking.  The patient is aware that without maximal medical management the underlying atherosclerotic disease process will progress, limiting the benefit of any interventions. The patient was given information about stroke prevention and what symptoms should prompt the patient to seek immediate medical care. Thank you for allowing [place] to participate in this patient's care.  Clemon Chambers, RN, MSN, FNP-C [facility] [place] Office: [facility] Physician: Trula Slade  01/22/2015  2:27 PM

## 2015-02-27 ENCOUNTER — Ambulatory Visit
Admit: 2015-02-27 | Disposition: A | Discharge status: Home / Self Care | Payer: Self-pay | Attending: Oncology | Admitting: Oncology

## 2015-03-23 ENCOUNTER — Ambulatory Visit
Admit: 2015-03-23 | Disposition: A | Discharge status: Home / Self Care | Payer: Self-pay | Attending: Oncology | Admitting: Oncology

## 2015-04-10 NOTE — H&P (Signed)
PATIENT NAME:  KATO, WIECZOREK MR#:  [id number] DATE OF BIRTH:  1926/11/30  DATE OF ADMISSION:  08/04/2012  PREOPERATIVE HISTORY AND PHYSICAL   HISTORY OF PRESENT ILLNESS: Mr. Parlett is an 81 year old male who was found at the time of colonoscopy to have a nodular prostate with disease centered in the right lateral lobe. This was confirmed by urology. His PSA was 4.4. He underwent a transrectal ultrasound-guided biopsy showing Gleason 7 (4 + 3) and (3 + 4) adenocarcinoma. He had microwave therapy about four years prior and tolerated that well. He does have nocturia times 2 to 3 and some hesitancy and urgency. He does also have a history of idiopathic thrombocytopenia purpura of unknown etiology followed up by Dr. Grayland Ormond, although his platelets have been consistently over the 100,000 range. He is seen today for volume study to determine seed placement for I-125 interstitial implant.   PAST MEDICAL HISTORY:  1. Hypertension.  2. Gastroesophageal reflux disease.  3. Cholecystectomy.  4. Back surgery 2009.  5. Inguinal herniorrhaphy repair.  6. Abdominal aortic aneurysm repair.   FAMILY HISTORY: Noncontributory.   SOCIAL HISTORY: No history of smoking or EtOH abuse history.  He is accompanied by his wife today.   HOME MEDICATIONS:  1. Diovan/ HCT 320 mg/12.5-mg oral tablet, 1 tablet orally once a day.  2. Felodipine 10-mg oral tablet extended-release, 1 tablet orally once a day.  3. Crestor 20-mg oral tablet, 1 tablet once a day.  4. Glucosamine/chondroitin with MSM, 1 tablet 2 times a day.  5. Fish oil 100,000-mg oral capsule.  6. Centrum Silver oral tablet once a day.  7. Prilosec OTC 20-mg delayed-release tablet, 1 tablet once a day.  8. Ecotrin 162-mg tablet, 1 tablet once a day.  I have asked him to discontinue that two weeks prior to his implant.   ALLERGIES: No known medical allergies.   REVIEW OF SYSTEMS: Unchanged from prior examination dated 05/12/2012.   PHYSICAL  EXAMINATION: VITAL SIGNS: Vital signs are stable. Temperature 97.6, pulse 58 per minute, respirations 18 per minute, blood pressure 127/75, current weight 74 kg.   GENERAL: Well-developed, well-nourished, elderly male in no acute distress.   HEENT: Pupils are equal, round and reactive to light and accommodation. Extraocular movements intact. Disks not visualized.   NECK: Clear without evidence of cervical or supraclavicular adenopathy.   LUNGS: Clear to auscultation and percussion.   HEART: Regular rate and rhythm.   ABDOMEN: Benign with no organomegaly or masses noted.   NEUROLOGIC: Motor, sensory, and deep tendon reflex levels are equal and symmetric in the upper and lower extremities. Proprioception is intact.   EXTREMITIES: No peripheral adenopathy or edema is identified.   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   PLAN: At this point in time the patient is cleared to go ahead with I-125 interstitial implant with the intent to deliver 145 Gy to his prostatic volume. Volume study was performed today and  BrachyVision treatment planning will be performed to determine seed placement. The risks and benefits of treatment were reviewed with the patient and his wife and consent was signed.    I would like to take this opportunity to thank you for allowing me to continue to participate in his care.     ____________________________ Armstead Peaks, MD [id number] D: 07/14/2012 12:25:00 ET T: 07/14/2012 12:47:43 ET JOB#: [id number]  cc: Armstead Peaks, MD, <Dictator> Armstead Peaks MD ELECTRONICALLY SIGNED  07/15/2012 13:23 

## 2015-04-10 NOTE — Op Note (Signed)
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  PROCEDURES:  1. I-125 transperineal interstitial implant.  2. Cystoscopy.   SURGEON: Shadawn Hanaway C. Bernardo Heater, MD   RADIATION ONCOLOGIST: Noreene Filbert, MD   ANESTHETIC: General.   INDICATION: This is a an 81 year old male initially referred for a nodular prostate and PSA of 4.4. He elected to pursue biopsy which was positive for Gleason 4 + 3 adenocarcinoma of the prostate. After discussion of treatment options, he elected definitive treatment. He has undergone a prior brachytherapy planning study.   DESCRIPTION OF PROCEDURE: The patient was taken to the cystoscopy suite where a general anesthetic was administered via a laryngeal mask airway. He was placed in the lithotomy position to match that position obtained during the brachytherapy planning study. His perineum and external genitalia were prepped and draped in the usual fashion. A 16 French Foley catheter was placed to gravity drainage. A 7.5 MHz transrectal ultrasound probe was lubricated and inserted per rectum and then placed into the stepping unit. Prostatic image was positioned to match that obtained during the brachytherapy planning study. Time-out was performed per protocol. Preloaded needles were then placed under ultrasound guidance to the coordinates determined by the brachytherapy planning study. Position was confirmed in both transverse and sagittal ultrasonic views. 16 needles were placed with a total number of 72 seeds. At completion of the procedure, the Foley catheter and ultrasound probe were removed.   A 21 French cystoscope with 30 degree lens was lubricated and passed under direct vision. The urethra was normal in caliber without  stricture. Prostate was remarkable for moderate lateral lobe enlargement. No seeds were identified within the prostatic urethra or bladder. Cystoscope was removed. Foley catheter was replaced. Postoperative fluoroscopic image showed significant distribution and all seeds accounted for. A B and O suppository was placed per rectum. The patient was taken to PAC-U in stable condition. There were no complications. EBL was minimal.   ____________________________ Ronda Fairly. Bernardo Heater, MD scs:drc D: 08/18/2012 09:02:50 ET T: 08/18/2012 11:16:10 ET JOB#: [id number]  cc: Nicki Reaper C. Bernardo Heater, MD, <Dictator> Abbie Sons MD ELECTRONICALLY SIGNED 08/27/2012 12:07

## 2015-04-10 NOTE — H&P (Signed)
PATIENT NAME:  [name], [name] MR#:  [id number] DATE OF BIRTH:  24-Aug-1926  DATE OF ADMISSION:  08/18/2012  PREOPERATIVE HISTORY AND PHYSICAL   HISTORY OF PRESENT ILLNESS: [name] is an 81 year old male who was found at the time of colonoscopy to have a nodular prostate with disease centered in the right lateral lobe. This was confirmed by urology. His PSA was 4.4. He underwent a transrectal ultrasound-guided biopsy showing Gleason 7 (4 + 3) and (3 + 4) adenocarcinoma. He had microwave therapy about four years prior and tolerated that well. He does have nocturia times 2 to 3 and some hesitancy and urgency. He does also have a history of idiopathic thrombocytopenia purpura of unknown etiology followed up by Dr. Grayland Ormond, although his platelets have been consistently over the 100,000 range. He is seen today for volume study to determine seed placement for I-125 interstitial implant.   PAST MEDICAL HISTORY:  1. Hypertension.  2. Gastroesophageal reflux disease.  3. Cholecystectomy.  4. Back surgery 2009.  5. Inguinal herniorrhaphy repair.  6. Abdominal aortic aneurysm repair.   FAMILY HISTORY: Noncontributory.   SOCIAL HISTORY: No history of smoking or EtOH abuse history.  He is accompanied by his wife today.   HOME MEDICATIONS:  1. Diovan/ HCT 320 mg/12.5-mg oral tablet, 1 tablet orally once a day.  2. Felodipine 10-mg oral tablet extended-release, 1 tablet orally once a day.  3. Crestor 20-mg oral tablet, 1 tablet once a day.  4. Glucosamine/chondroitin with MSM, 1 tablet 2 times a day.  5. Fish oil 100,000-mg oral capsule.  6. Centrum Silver oral tablet once a day.  7. Prilosec OTC 20-mg delayed-release tablet, 1 tablet once a day.  8. Ecotrin 162-mg tablet, 1 tablet once a day.  I have asked him to discontinue that two weeks prior to his implant.   ALLERGIES: No known medical allergies.   REVIEW OF SYSTEMS: Unchanged from prior examination dated 05/12/2012.   PHYSICAL  EXAMINATION: VITAL SIGNS: Vital signs are stable. Temperature 97.6, pulse 58 per minute, respirations 18 per minute, blood pressure 127/75, current weight 74 kg.   GENERAL: Well-developed, well-nourished, elderly male in no acute distress.   HEENT: Pupils are equal, round and reactive to light and accommodation. Extraocular movements intact. Disks not visualized.   NECK: Clear without evidence of cervical or supraclavicular adenopathy.   LUNGS: Clear to auscultation and percussion.   HEART: Regular rate and rhythm.   ABDOMEN: Benign with no organomegaly or masses noted.   NEUROLOGIC: Motor, sensory, and deep tendon reflex levels are equal and symmetric in the upper and lower extremities. Proprioception is intact.   EXTREMITIES: No peripheral adenopathy or edema is identified.   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   PLAN: At this point in time the patient is cleared to go ahead with I-125 interstitial implant with the intent to deliver 145 Gy to his prostatic volume. Volume study was performed today and  BrachyVision treatment planning will be performed to determine seed placement. The risks and benefits of treatment were reviewed with the patient and his wife and consent was signed.    I would like to take this opportunity to thank you for allowing me to continue to participate in his care.     ____________________________ Armstead Peaks, MD gsc:bjt D: 07/14/2012 12:25:00 ET T: 07/14/2012 12:47:43 ET JOB#: [id number]  cc: Armstead Peaks, MD, <Dictator> Armstead Peaks MD ELECTRONICALLY SIGNED  07/15/2012 13:23 

## 2015-04-15 NOTE — Consult Note (Signed)
Reason for Visit: This 81 year old Male patient presents to the clinic for initial evaluation of  Prostate cancer .   Referred by Dr. Madelin Headings.  Diagnosis:   Chief Complaint/Diagnosis   81 year old male with adenocarcinoma of the prostate stage IIa (TIIa . N0 M0 with a Gleason score of 7 (4+3)   Pathology Report Pathology report reviewed    Referral Report Clinical notes reviewed    Planned Treatment Regimen Radiation therapy either implant versus external beam IMRT treatment    HPI   patient is a 81 year old male found at the time of colonoscopy to have a nodular prostate centered in the right lateral lobe taking up less than half of the lobe. This was confirmed by urology. His PSA was 4.4. He underwent transrectal ultrasound-guided biopsy showing by lobar adenocarcinoma of the prostate Gleason 7 a combination of (4+3) and 3+4). He did have microwave therapy about 4 years prior for lower urinary tract symptoms and tolerating that well. He does have nocturia x2 does else some hesitancy and urgency. He is now referred to radiation oncology for consideration of treatment. He is having no bone pain and has not undergone a bone scan.patient does have a history of ITP of unknown etiology he is being followed by the Dr. Grayland Ormond for that with platelet counts hovering 100,000 range.  Past Hx:    HTN:    GERD:    Cholecystectomy:    Back Surgery: 2009   Inguinal Hernia repair:    AAA - Abdominal Aortic Aneurysm Repair:   Past, Family and Social History:   Past Medical History positive    Cardiovascular hypertension; Abdominal aortic aneurysm    Gastrointestinal GERD    Past Surgical History cholecystectomy; Inguinal herniorrhaphy repair, back surgery    Past Medical History Comments Patient's history of ITP with platelet counts in the 100,000 range being followed Dr. Grayland Ormond    Family History noncontributory    Social History noncontributory    Additional Past Medical and  Surgical History Accompanied by his wife today   Allergies:   No Known Allergies:   Home Meds:  Home Medications: Medication Instructions Status  Diovan HCT 320 mg-12.5 mg oral tablet 1 tab(s) orally once a day  Active  felodipine 10 mg oral tablet, extended release 1 tab(s) orally once a day  Active  Crestor 20 mg oral tablet 1 tab(s) orally once a day  Active  Glucosamine & Chondroitin with MSM 1 tab(s) po 2 times a day  Active  Fish Oil 1000 mg oral capsule 1 tab(s) orally once a day  Active  Centrum Silver oral tablet 1 tab(s) orally once a day Active  PriLOSEC OTC 20 mg oral delayed release tablet 1 tab(s) orally once a day Active  Ecotrin  162 mg 1 dose(s) orally once a day Active   Review of Systems:   General negative    Performance Status (ECOG) 0    Skin negative    Breast negative    Ophthalmologic negative    ENMT negative    Respiratory and Thorax negative    Cardiovascular see HPI    Gastrointestinal negative    Genitourinary see HPI    Musculoskeletal negative    Neurological negative    Psychiatric negative    Hematology/Lymphatics see HPI    Endocrine negative    Allergic/Immunologic negative   Nursing Notes:  Nursing Vital Signs and Chemo Nursing Nursing Notes: *CC Vital Signs Flowsheet:   22-May-13 13:07   Temp Temperature  97.6   Pulse Pulse 58   Respirations Respirations 18   SBP SBP 127   DBP DBP 75   Current Weight (kg) (kg) 74   Height (cm) centimeters 174   BSA (m2) 1.8   Physical Exam:  General/Skin/HEENT:   General normal    Skin normal    Eyes normal    ENMT normal    Head and Neck normal    Additional PE Well-developed elderly gentleman in NAD. Lungs are clear to A&P cardiac examination shows regular rate and rhythm. On rectal exam rectal sphincter tone is good. There is firm induration of his right lateral lobe of his prostate. Sulcus is somewhat obliterated. Left lobe of prostate is within normal limits. No  other rectal abnormalities identified.   Breasts/Resp/CV/GI/GU:   Respiratory and Thorax normal    Cardiovascular normal    Gastrointestinal normal    Genitourinary normal   MS/Neuro/Psych/Lymph:   Musculoskeletal normal    Neurological normal    Lymphatics normal   Assessment and Plan:  Impression:   clinical stage IIa adenocarcinoma the prostate in 81 year old male with ITP  Plan:   at this time I got over treatment options with the patient. Certainly not a surgical candidate based on age and history of ITP. Would favor external beam IMRT radiation therapy to 8000 cGy. Patient also be a candidate for I-125 interstitial implant although based on his history of ITP and low platelet count would favor the former treatment option. Also would like the patient to start Lupron therapy based on the Gleason 7 (4+3) histology. Would keep them suppressed for at least a year and a half. Patient would like to talk over with his wife and discuss with family treatment options. I have set him up in 2 weeks for reevaluation and for decision at that time. We will go ahead with a four-month Lupron Depot injection at that time. Patient understands risks benefits of all radiation therapy options including further exacerbation of urinary symptoms, possibility of diarrhea and fatigue.  I would like to take this opportunity to thank you for allowing me to continue to participate in this patient's care.  CC Referral:   cc: Dr. Madelin Headings, Dr. [name]   Electronic Signatures: Baruch Gouty, Roda Shutters (MD)  (Signed [phone] 14:03)  Authored: HPI, Diagnosis, Past Hx, PFSH, Allergies, Home Meds, ROS, Nursing Notes, Physical Exam, Encounter Assessment and Plan, CC Referring Physician   Last Updated: 23-May-13 14:03 by Armstead Peaks (MD)

## 2015-05-30 ENCOUNTER — Inpatient Hospital Stay: Payer: Medicare Other

## 2015-08-04 ENCOUNTER — Other Ambulatory Visit: Payer: Self-pay

## 2015-08-04 ENCOUNTER — Emergency Department: Payer: Medicare Other

## 2015-08-04 ENCOUNTER — Emergency Department
Admission: EM | Admit: 2015-08-04 | Discharge: 2015-08-04 | Disposition: A | Discharge status: Home / Self Care | Payer: Medicare Other | Attending: Emergency Medicine | Admitting: Emergency Medicine

## 2015-08-04 ENCOUNTER — Encounter: Payer: Self-pay | Admitting: Emergency Medicine

## 2015-08-04 DIAGNOSIS — Z87891 Personal history of nicotine dependence: Secondary | ICD-10-CM | POA: Insufficient documentation

## 2015-08-04 DIAGNOSIS — Z79899 Other long term (current) drug therapy: Secondary | ICD-10-CM | POA: Diagnosis not present

## 2015-08-04 DIAGNOSIS — D696 Thrombocytopenia, unspecified: Secondary | ICD-10-CM | POA: Diagnosis not present

## 2015-08-04 DIAGNOSIS — I10 Essential (primary) hypertension: Secondary | ICD-10-CM | POA: Diagnosis not present

## 2015-08-04 DIAGNOSIS — R079 Chest pain, unspecified: Secondary | ICD-10-CM | POA: Insufficient documentation

## 2015-08-04 DIAGNOSIS — Z7982 Long term (current) use of aspirin: Secondary | ICD-10-CM | POA: Diagnosis not present

## 2015-08-04 LAB — CBC WITH DIFFERENTIAL/PLATELET
Basophils Absolute: 0 10*3/uL (ref 0–0.1)
Basophils Relative: 1 %
Eosinophils Absolute: 0.1 10*3/uL (ref 0–0.7)
Eosinophils Relative: 4 %
HCT: 36.7 % — ABNORMAL LOW (ref 40.0–52.0)
Hemoglobin: 12.4 g/dL — ABNORMAL LOW (ref 13.0–18.0)
Lymphocytes Relative: 26 %
Lymphs Abs: 0.8 10*3/uL — ABNORMAL LOW (ref 1.0–3.6)
MCH: 28.5 pg (ref 26.0–34.0)
MCHC: 33.9 g/dL (ref 32.0–36.0)
MCV: 84 fL (ref 80.0–100.0)
Monocytes Absolute: 0.5 10*3/uL (ref 0.2–1.0)
Monocytes Relative: 16 %
Neutro Abs: 1.8 10*3/uL (ref 1.4–6.5)
Neutrophils Relative %: 53 %
Platelets: 49 10*3/uL — ABNORMAL LOW (ref 150–440)
RBC: 4.37 MIL/uL — ABNORMAL LOW (ref 4.40–5.90)
RDW: 14.4 % (ref 11.5–14.5)
WBC: 3.2 10*3/uL — ABNORMAL LOW (ref 3.8–10.6)

## 2015-08-04 LAB — COMPREHENSIVE METABOLIC PANEL
ALT: 27 U/L (ref 17–63)
AST: 35 U/L (ref 15–41)
Albumin: 4.9 g/dL (ref 3.5–5.0)
Alkaline Phosphatase: 51 U/L (ref 38–126)
Anion gap: 11 (ref 5–15)
BUN: 15 mg/dL (ref 6–20)
CO2: 25 mmol/L (ref 22–32)
Calcium: 9.4 mg/dL (ref 8.9–10.3)
Chloride: 93 mmol/L — ABNORMAL LOW (ref 101–111)
Creatinine, Ser: 1 mg/dL (ref 0.61–1.24)
GFR calc Af Amer: >60 mL/min (ref >60)
GFR calc non Af Amer: >60 mL/min (ref >60)
Glucose, Bld: 110 mg/dL — ABNORMAL HIGH (ref 65–99)
Potassium: 3.5 mmol/L (ref 3.5–5.1)
Sodium: 129 mmol/L — ABNORMAL LOW (ref 135–145)
Total Bilirubin: 0.6 mg/dL (ref 0.3–1.2)
Total Protein: 6.9 g/dL (ref 6.5–8.1)

## 2015-08-04 LAB — TROPONIN I: Troponin I: <0.03 ng/mL (ref <0.031)

## 2015-08-04 MED ORDER — GI COCKTAIL ~~LOC~~
30.0000 mL | Freq: Once | ORAL | Status: AC
  Administered 2015-08-04: 30 mL via ORAL
  Filled 2015-08-04: qty 30

## 2015-08-04 NOTE — Discharge Instructions (Signed)
Chest Pain (Nonspecific) °It is often hard to give a specific diagnosis for the cause of chest pain. There is always a chance that your pain could be related to something serious, such as a heart attack or a blood clot in the lungs. You need to follow up with your health care provider for further evaluation. °CAUSES  °· Heartburn. °· Pneumonia or bronchitis. °· Anxiety or stress. °· Inflammation around your heart (pericarditis) or lung (pleuritis or pleurisy). °· A blood clot in the lung. °· A collapsed lung (pneumothorax). It can develop suddenly on its own (spontaneous pneumothorax) or from trauma to the chest. °· Shingles infection (herpes zoster virus). °The chest wall is composed of bones, muscles, and cartilage. Any of these can be the source of the pain. °· The bones can be bruised by injury. °· The muscles or cartilage can be strained by coughing or overwork. °· The cartilage can be affected by inflammation and become sore (costochondritis). °DIAGNOSIS  °Lab tests or other studies may be needed to find the cause of your pain. Your health care provider may have you take a test called an ambulatory electrocardiogram (ECG). An ECG records your heartbeat patterns over a 24-hour period. You may also have other tests, such as: °· Transthoracic echocardiogram (TTE). During echocardiography, sound waves are used to evaluate how blood flows through your heart. °· Transesophageal echocardiogram (TEE). °· Cardiac monitoring. This allows your health care provider to monitor your heart rate and rhythm in real time. °· Holter monitor. This is a portable device that records your heartbeat and can help diagnose heart arrhythmias. It allows your health care provider to track your heart activity for several days, if needed. °· Stress tests by exercise or by giving medicine that makes the heart beat faster. °TREATMENT  °· Treatment depends on what may be causing your chest pain. Treatment may include: °¨ Acid blockers for  heartburn. °¨ Anti-inflammatory medicine. °¨ Pain medicine for inflammatory conditions. °¨ Antibiotics if an infection is present. °· You may be advised to change lifestyle habits. This includes stopping smoking and avoiding alcohol, caffeine, and chocolate. °· You may be advised to keep your head raised (elevated) when sleeping. This reduces the chance of acid going backward from your stomach into your esophagus. °Most of the time, nonspecific chest pain will improve within 2-3 days with rest and mild pain medicine.  °HOME CARE INSTRUCTIONS  °· If antibiotics were prescribed, take them as directed. Finish them even if you start to feel better. °· For the next few days, avoid physical activities that bring on chest pain. Continue physical activities as directed. °· Do not use any tobacco products, including cigarettes, chewing tobacco, or electronic cigarettes. °· Avoid drinking alcohol. °· Only take medicine as directed by your health care provider. °· Follow your health care provider's suggestions for further testing if your chest pain does not go away. °· Keep any follow-up appointments you made. If you do not go to an appointment, you could develop lasting (chronic) problems with pain. If there is any problem keeping an appointment, call to reschedule. °SEEK MEDICAL CARE IF:  °· Your chest pain does not go away, even after treatment. °· You have a rash with blisters on your chest. °· You have a fever. °SEEK IMMEDIATE MEDICAL CARE IF:  °· You have increased chest pain or pain that spreads to your arm, neck, jaw, back, or abdomen. °· You have shortness of breath. °· You have an increasing cough, or you cough   up blood.  You have severe back or abdominal pain.  You feel nauseous or vomit.  You have severe weakness.  You faint.  You have chills. This is an emergency. Do not wait to see if the pain will go away. Get medical help at once. Call your local emergency services (911 in [place] Do not drive  yourself to the hospital. MAKE SURE YOU:   Understand these instructions.  Will watch your condition.  Will get help right away if you are not doing well or get worse. Document Released: 09/17/2005 Document Revised: 12/13/2013 Document Reviewed: 07/13/2008 [facility] Patient Information 2015 [place], [place]. This information is not intended to replace advice given to you by your health care provider. Make sure you discuss any questions you have with your health care provider.  Thrombocytopenia Thrombocytopenia means there are not enough platelets in your blood. Platelets are tiny cells in your blood. When you start bleeding, platelets clump together around the cut or injury to stop the bleeding. This process is called blood clotting. Not having enough platelets can cause bleeding problems. HOME CARE  Check your skin and inside your mouth for bruises or blood as told by your doctor.  Check your spit (sputum), pee (urine), and poop (stool) for blood as told by your doctor.  Do not do activities that can cause bumps or bruises until your doctor says it is okay.  Be careful not to cut yourself when you shave or use scissors, needles, knives, or other tools.  Be careful not to burn yourself when you iron or cook.  Ask your doctor if you can drink alcohol.  Only take medicines as told by your doctor.  Tell all your doctors and your dentist that you have this bleeding problem. GET HELP RIGHT AWAY IF:  You are bleeding anywhere on your body.  You are bleeding or have bruises without knowing why.  You have blood in your spit, pee, or poop. MAKE SURE YOU:  Understand these instructions.  Will watch your condition.  Will get help right away if you are not doing well or get worse. Document Released: 11/27/2011 Document Revised: 03/01/2012 Document Reviewed: 11/27/2011 [facility] Patient Information 2015 [place], [place]. This information is not intended to replace advice given to you by  your health care provider. Make sure you discuss any questions you have with your health care provider.

## 2015-08-04 NOTE — ED Provider Notes (Signed)
[facility] Emergency Department Provider Note  ____________________________________________  Time seen: Approximately 528 PM  I have reviewed the triage vital signs and the nursing notes.   HISTORY  Chief Complaint Chest Pain    HPI [name] is a 81 y.o. male with a history of hypertension who presents today with 3 days of chest pain. He said that the chest pain started after he was spray painting a fence. He said that he was wearing a mask that allowed fumes to be inhaled. He said that he was holding his arms over his head at the time while he was spray painting as well. However, he denies any pain with range of motion of the upper extremities. He denies any worsening of the pain with movement. He denies any exertional worsening or shortness of breath. No cough, fever. Says that feels similar to chest pain in the past that he has experienced indigestion. However, he has tried Prilosec without any relief. He says the chest pain is midsternal and mild. It is nonradiating. There is no nausea vomiting or diarrhea.   Past Medical History  Diagnosis Date  . Enlarged prostate 2008    Thermotherapy  . Arthritis   . Hypertension   . Psoriasis   . Gastroesophageal reflux disease   . Peripheral vascular disease   . CAD (coronary artery disease)   . Hyperlipidemia   . Adenomatous colon polyp   . Aortic aneurysm 1999    repair  . AAA (abdominal aortic aneurysm) 1999  . Carotid artery occlusion   . Cancer Aug. 2013    prostate Seeding  implant  . Basal cell cancer Dec. 24, 2013    Nose    Patient Active Problem List   Diagnosis Date Noted  . Chronic vascular insufficiency of intestine 05/24/2012  . Abdominal pain, right lower quadrant 05/24/2012  . Occlusion and stenosis of carotid artery without mention of cerebral infarction 04/29/2012    Past Surgical History  Procedure Laterality Date  . Cholecystectomy    . Cardiac catheterization  10/2009   . Spine surgery  2009    ruptured disc  . Visceral angiogram  05/14/2012    Right  groin area  . Abdominal aortic aneurysm repair  1999  . Visceral angiogram N/A 05/14/2012    Procedure: VISCERAL ANGIOGRAM;  Surgeon: Elam Dutch, MD;  Location: [facility] CATH LAB;  Service: Cardiovascular;  Laterality: N/A;    Current Outpatient Rx  Name  Route  Sig  Dispense  Refill  . aspirin EC 81 MG tablet   Oral   Take 81 mg by mouth daily.          . felodipine (PLENDIL) 10 MG 24 hr tablet   Oral   Take 10 mg by mouth daily.         . fish oil-omega-3 fatty acids 1000 MG capsule   Oral   Take 2 g by mouth daily.         Marland Kitchen glucosamine-chondroitin 500-400 MG tablet   Oral   Take 1 tablet by mouth 2 (two) times daily.          . Multiple Vitamin (MULTIVITAMIN) tablet   Oral   Take 1 tablet by mouth daily.         Marland Kitchen omeprazole (PRILOSEC) 20 MG capsule   Oral   Take 20 mg by mouth daily.         . rosuvastatin (CRESTOR) 20 MG tablet   Oral  Take 20 mg by mouth daily.         . sucralfate (CARAFATE) 1 G tablet   Oral   Take 1 g by mouth 2 (two) times daily.         . valsartan-hydrochlorothiazide (DIOVAN-HCT) 320-12.5 MG per tablet   Oral   Take 1 tablet by mouth daily.           Allergies Review of patient's allergies indicates no known allergies.  Family History  Problem Relation Age of Onset  . Heart attack Father   . Heart disease Father     After age 44  . COPD Father   . Cancer Sister     Breast and Brain  . Heart disease Brother     before age 5  . Heart attack Brother     Social History Social History  Substance Use Topics  . Smoking status: Former Smoker    Types: Cigarettes    Quit date: 04/29/1997  . Smokeless tobacco: Never Used  . Alcohol Use: No    Review of Systems Constitutional: No fever/chills Eyes: No visual changes. ENT: No sore throat. Cardiovascular: As above  Respiratory: Denies shortness of  breath. Gastrointestinal: No abdominal pain.  No nausea, no vomiting.  No diarrhea.  No constipation. Genitourinary: Negative for dysuria. Musculoskeletal: Negative for back pain. Skin: Negative for rash. Neurological: Negative for headaches, focal weakness or numbness.  10-point ROS otherwise negative.  ____________________________________________   PHYSICAL EXAM:  VITAL SIGNS: ED Triage Vitals  Enc Vitals Group     BP 08/04/15 1640 127/69 mmHg     Pulse Rate 08/04/15 1640 77     Resp 08/04/15 1640 18     Temp 08/04/15 1640 98.1 F (36.7 C)     Temp Source 08/04/15 1640 Oral     SpO2 08/04/15 1640 98 %     Weight 08/04/15 1640 160 lb (72.576 kg)     Height 08/04/15 1640 5\' 9"  (1.753 m)     Head Cir --      Peak Flow --      Pain Score 08/04/15 1628 5     Pain Loc --      Pain Edu? --      Excl. in [place]? --     Constitutional: Alert and oriented. Well appearing and in no acute distress. Eyes: Conjunctivae are normal. PERRL. EOMI. Head: Atraumatic. Nose: No congestion/rhinnorhea. Mouth/Throat: Mucous membranes are moist.  Oropharynx non-erythematous. Neck: No stridor.   Cardiovascular: Normal rate, regular rhythm. Grossly normal heart sounds.  Good peripheral circulation. Respiratory: Normal respiratory effort.  No retractions. Lungs CTAB. Gastrointestinal: Soft and nontender. No distention. No abdominal bruits. No CVA tenderness. Musculoskeletal: No lower extremity tenderness nor edema.  No joint effusions. Neurologic:  Normal speech and language. No gross focal neurologic deficits are appreciated. No gait instability. Skin:  Skin is warm, dry and intact. No rash noted. Psychiatric: Mood and affect are normal. Speech and behavior are normal.  ____________________________________________   LABS (all labs ordered are listed, but only abnormal results are displayed)  Labs Reviewed  CBC WITH DIFFERENTIAL/PLATELET - Abnormal; Notable for the following:    WBC 3.2 (*)     RBC 4.37 (*)    Hemoglobin 12.4 (*)    HCT 36.7 (*)    Platelets 49 (*)    All other components within normal limits  COMPREHENSIVE METABOLIC PANEL - Abnormal; Notable for the following:    Sodium 129 (*)    Chloride  93 (*)    Glucose, Bld 110 (*)    All other components within normal limits  TROPONIN I   ____________________________________________  EKG  ED ECG REPORT I, Mikhi Athey,  Youlanda Roys, the attending physician, personally viewed and interpreted this ECG.   Date: 08/04/2015  EKG Time: 1633  Rate: 65  Rhythm: normal sinus rhythm  Axis: Left axis  Intervals:left bundle branch block  ST&T Change: T-wave inversion in aVL. No ST segment elevations or depressions. Almost identical appearance of EKG to multiple EKGs taken in the past. Especially to that from 02/12/2008.  ____________________________________________  RADIOLOGY  Chest x-ray without acute cardiopulmonary disease. I personally reviewed this image. ____________________________________________   PROCEDURES    ____________________________________________   INITIAL IMPRESSION / ASSESSMENT AND PLAN / ED COURSE  Pertinent labs & imaging results that were available during my care of the patient were reviewed by me and considered in my medical decision making (see chart for details).  ----------------------------------------- 5:29 PM on 08/04/2015 -----------------------------------------  Discussed the case with Dr. Satira Mccallum, including the EKG. Says to call this Monday morning for same-day follow-up appointment at 8:30 AM to the office. Also discussed the case with poison center who do not think that the symptoms are related to the spray pain.  ----------------------------------------- 6:29 PM on 08/04/2015 -----------------------------------------  Patient feels improved after GI cocktail. We'll continue to take Prilosec at home. Discussed the lab results with him as well as his wife including his low  platelets. He does not report any active bleeding at this time. He says that he has a follow-up appointment with the oncologist in 1-2 weeks. Denies taking any antiplatelet agents at this time. We'll discharge to home with outpatient follow-up. He will give Dr. Nehemiah Massed a call this Monday morning at 8:30 AM for follow-up appointment. Chest pain-free at this time. Atypical presentation for cardiac disease, however given patient's age as well as hypertension I believe it is appropriate for him to follow up with the cardiologist. No pleuritic component. Unlikely to be pulmonary embolus. Unlikely to be toxicologic etiology from the paint fumes. Possible musculoskeletal component. However given relief with GI cocktail more likely to be reflux. ____________________________________________   FINAL CLINICAL IMPRESSION(S) / ED DIAGNOSES  Acute chest pain. Resolved. Initial visit.    Orbie Pyo, MD 08/04/15 [phone]

## 2015-08-04 NOTE — ED Notes (Signed)
Patient stable at discharge.

## 2015-08-04 NOTE — ED Notes (Signed)
Pt reports having cp x 3 days, states it started after spray painting a swing.  Denies sob.  Skin w/d

## 2015-08-07 DIAGNOSIS — I10 Essential (primary) hypertension: Secondary | ICD-10-CM | POA: Insufficient documentation

## 2015-08-07 DIAGNOSIS — I6523 Occlusion and stenosis of bilateral carotid arteries: Secondary | ICD-10-CM | POA: Insufficient documentation

## 2015-08-07 DIAGNOSIS — E782 Mixed hyperlipidemia: Secondary | ICD-10-CM | POA: Insufficient documentation

## 2015-08-07 DIAGNOSIS — I2510 Atherosclerotic heart disease of native coronary artery without angina pectoris: Secondary | ICD-10-CM | POA: Insufficient documentation

## 2015-08-30 ENCOUNTER — Ambulatory Visit: Payer: Medicare Other | Admitting: Oncology

## 2015-08-30 ENCOUNTER — Other Ambulatory Visit: Payer: Self-pay | Admitting: *Deleted

## 2015-08-30 ENCOUNTER — Other Ambulatory Visit: Payer: Medicare Other

## 2015-08-30 DIAGNOSIS — D696 Thrombocytopenia, unspecified: Secondary | ICD-10-CM

## 2015-09-04 ENCOUNTER — Inpatient Hospital Stay: Payer: Medicare Other | Attending: Oncology

## 2015-09-04 ENCOUNTER — Inpatient Hospital Stay (HOSPITAL_BASED_OUTPATIENT_CLINIC_OR_DEPARTMENT_OTHER): Payer: Medicare Other | Admitting: Oncology

## 2015-09-04 VITALS — BP 117/68 | HR 67 | Temp 97.8°F | Resp 16 | Wt 164.9 lb

## 2015-09-04 DIAGNOSIS — Z79899 Other long term (current) drug therapy: Secondary | ICD-10-CM | POA: Diagnosis not present

## 2015-09-04 DIAGNOSIS — I739 Peripheral vascular disease, unspecified: Secondary | ICD-10-CM | POA: Insufficient documentation

## 2015-09-04 DIAGNOSIS — Z8546 Personal history of malignant neoplasm of prostate: Secondary | ICD-10-CM | POA: Diagnosis not present

## 2015-09-04 DIAGNOSIS — I10 Essential (primary) hypertension: Secondary | ICD-10-CM

## 2015-09-04 DIAGNOSIS — L409 Psoriasis, unspecified: Secondary | ICD-10-CM | POA: Insufficient documentation

## 2015-09-04 DIAGNOSIS — D61818 Other pancytopenia: Secondary | ICD-10-CM

## 2015-09-04 DIAGNOSIS — Z87891 Personal history of nicotine dependence: Secondary | ICD-10-CM | POA: Diagnosis not present

## 2015-09-04 DIAGNOSIS — I2510 Atherosclerotic heart disease of native coronary artery without angina pectoris: Secondary | ICD-10-CM | POA: Diagnosis not present

## 2015-09-04 DIAGNOSIS — N400 Enlarged prostate without lower urinary tract symptoms: Secondary | ICD-10-CM

## 2015-09-04 DIAGNOSIS — E785 Hyperlipidemia, unspecified: Secondary | ICD-10-CM

## 2015-09-04 DIAGNOSIS — D696 Thrombocytopenia, unspecified: Secondary | ICD-10-CM

## 2015-09-04 DIAGNOSIS — Z85828 Personal history of other malignant neoplasm of skin: Secondary | ICD-10-CM | POA: Insufficient documentation

## 2015-09-04 LAB — CBC WITH DIFFERENTIAL/PLATELET
Basophils Absolute: 0 10*3/uL (ref 0–0.1)
Basophils Relative: 0 %
Eosinophils Absolute: 0.1 10*3/uL (ref 0–0.7)
Eosinophils Relative: 4 %
HCT: 33.4 % — ABNORMAL LOW (ref 40.0–52.0)
Hemoglobin: 11.6 g/dL — ABNORMAL LOW (ref 13.0–18.0)
Lymphocytes Relative: 29 %
Lymphs Abs: 0.9 10*3/uL — ABNORMAL LOW (ref 1.0–3.6)
MCH: 29.5 pg (ref 26.0–34.0)
MCHC: 34.8 g/dL (ref 32.0–36.0)
MCV: 84.7 fL (ref 80.0–100.0)
Monocytes Absolute: 0.4 10*3/uL (ref 0.2–1.0)
Monocytes Relative: 14 %
Neutro Abs: 1.7 10*3/uL (ref 1.4–6.5)
Neutrophils Relative %: 53 %
Platelets: 61 10*3/uL — ABNORMAL LOW (ref 150–440)
RBC: 3.94 MIL/uL — ABNORMAL LOW (ref 4.40–5.90)
RDW: 14.2 % (ref 11.5–14.5)
WBC: 3.2 10*3/uL — ABNORMAL LOW (ref 3.8–10.6)

## 2015-09-13 NOTE — Progress Notes (Signed)
[place]  Telephone:(336) [phone] Fax:(336) [phone]  ID: Hale Drone OB: [birth year]  MR#: [id number]  QIO#:[id number]  Patient Care Team: [name], MD as PCP - General (Family Medicine) Manya Silvas, MD (Gastroenterology)  CHIEF COMPLAINT:  Chief Complaint  Patient presents with  . Pancytopenia    INTERVAL HISTORY: Patient returns to clinic today for repeat laboratory work and further evaluation.  He continues to feel well and is asymptomatic.  He denies any easy bleeding or bruising.  He has had no recent fevers or illnesses.  He has a good appetite and denies weight loss.  He has no chest pain, shortness of breath, or cough.  He has no nausea, vomiting, constipation, or diarrhea.  He has no urinary complaints.  Patient offers no specific complaints today.  REVIEW OF SYSTEMS:   Review of Systems  Constitutional: Negative.  Negative for malaise/fatigue.  Respiratory: Negative.   Cardiovascular: Negative.   Neurological: Negative.  Negative for weakness.  Endo/Heme/Allergies: Does not bruise/bleed easily.    As per HPI. Otherwise, a complete review of systems is negatve.  PAST MEDICAL HISTORY: Past Medical History  Diagnosis Date  . Enlarged prostate 2008    Thermotherapy  . Arthritis   . Hypertension   . Psoriasis   . Gastroesophageal reflux disease   . Peripheral vascular disease   . CAD (coronary artery disease)   . Hyperlipidemia   . Adenomatous colon polyp   . Aortic aneurysm 1999    repair  . AAA (abdominal aortic aneurysm) 1999  . Carotid artery occlusion   . Cancer Aug. 2013    prostate Seeding  implant  . Basal cell cancer Dec. 24, 2013    Nose    PAST SURGICAL HISTORY: Past Surgical History  Procedure Laterality Date  . Cholecystectomy    . Cardiac catheterization  10/2009  . Spine surgery  2009    ruptured disc  . Visceral angiogram  05/14/2012    Right  groin area  . Abdominal aortic aneurysm repair  1999  .  Visceral angiogram N/A 05/14/2012    Procedure: VISCERAL ANGIOGRAM;  Surgeon: Elam Dutch, MD;  Location: [facility] CATH LAB;  Service: Cardiovascular;  Laterality: N/A;    FAMILY HISTORY Family History  Problem Relation Age of Onset  . Heart attack Father   . Heart disease Father     After age 85  . COPD Father   . Cancer Sister     Breast and Brain  . Heart disease Brother     before age 32  . Heart attack Brother        ADVANCED DIRECTIVES:    HEALTH MAINTENANCE: Social History  Substance Use Topics  . Smoking status: Former Smoker    Types: Cigarettes    Quit date: 04/29/1997  . Smokeless tobacco: Never Used  . Alcohol Use: No     Colonoscopy:  PAP:  Bone density:  Lipid panel:  Allergies  Allergen Reactions  . No Known Allergies     Current Outpatient Prescriptions  Medication Sig Dispense Refill  . felodipine (PLENDIL) 10 MG 24 hr tablet Take 10 mg by mouth daily.    Marland Kitchen glucosamine-chondroitin 500-400 MG tablet Take 1 tablet by mouth 2 (two) times daily.     Marland Kitchen loteprednol (LOTEMAX) 0.5 % ophthalmic suspension INSTILL 1 DROP INTO BOTH EYES EVERY DAY FOR 10 DAYS THEN TAPER    . Multiple Vitamin (MULTIVITAMIN) tablet Take 1 tablet by mouth daily.    Marland Kitchen  rosuvastatin (CRESTOR) 20 MG tablet Take 20 mg by mouth daily.    . valsartan-hydrochlorothiazide (DIOVAN-HCT) 320-25 MG per tablet Take 1 tablet by mouth daily.  5   No current facility-administered medications for this visit.    OBJECTIVE: Filed Vitals:   09/04/15 1539  BP: 117/68  Pulse: 67  Temp: 97.8 F (36.6 C)  Resp: 16     Body mass index is 24.34 kg/(m^2).    ECOG FS:0 - Asymptomatic  General: Well-developed, well-nourished, no acute distress. Eyes: Pink conjunctiva, anicteric sclera. Lungs: Clear to auscultation bilaterally. Heart: Regular rate and rhythm. No rubs, murmurs, or gallops. Abdomen: Soft, nontender, nondistended. No organomegaly noted, normoactive bowel sounds. Musculoskeletal: No  edema, cyanosis, or clubbing. Neuro: Alert, answering all questions appropriately. Cranial nerves grossly intact. Skin: No rashes or petechiae noted. Psych: Normal affect.   LAB RESULTS:  Lab Results  Component Value Date   NA 129* 08/04/2015   K 3.5 08/04/2015   CL 93* 08/04/2015   CO2 25 08/04/2015   GLUCOSE 110* 08/04/2015   BUN 15 08/04/2015   CREATININE 1.00 08/04/2015   CALCIUM 9.4 08/04/2015   PROT 6.9 08/04/2015   ALBUMIN 4.9 08/04/2015   AST 35 08/04/2015   ALT 27 08/04/2015   ALKPHOS 51 08/04/2015   BILITOT 0.6 08/04/2015   GFRNONAA >60 08/04/2015   GFRAA >60 08/04/2015    Lab Results  Component Value Date   WBC 3.2* 09/04/2015   NEUTROABS 1.7 09/04/2015   HGB 11.6* 09/04/2015   HCT 33.4* 09/04/2015   MCV 84.7 09/04/2015   PLT 61* 09/04/2015     STUDIES: No results found.  ASSESSMENT: Pancytopenia.  PLAN:    1.  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  Patient's platelet count has ranged between 49 and 104 since at least February 2013. His white and red blood cell count are also mildly decreased, but essentially stable. The remainder of his blood work was either negative or within normal limits.  To confirm MDS, a bone marrow biopsy would be required.  This is not necessary, since the treatment would be supportive care.  Return to clinic in 6 months for laboratory work and further evaluation.   2.  Prostate cancer: Patient is status post seed implantation.  His last PSA on March 19, 2015 was reported as 0.03.   Patient expressed understanding and was in agreement with this plan. He also understands that He can call clinic at any time with any questions, concerns, or complaints.   Lloyd Huger, MD   09/13/2015 9:09 AM

## 2016-01-25 ENCOUNTER — Encounter: Payer: Self-pay | Admitting: Family

## 2016-01-31 ENCOUNTER — Encounter (HOSPITAL_COMMUNITY): Payer: Medicare Other

## 2016-01-31 ENCOUNTER — Ambulatory Visit: Payer: Medicare Other | Admitting: Family

## 2016-03-03 ENCOUNTER — Inpatient Hospital Stay: Payer: Medicare Other | Admitting: Oncology

## 2016-03-03 ENCOUNTER — Inpatient Hospital Stay: Payer: Medicare Other

## 2016-03-13 ENCOUNTER — Other Ambulatory Visit: Payer: Medicare Other

## 2016-03-13 ENCOUNTER — Ambulatory Visit: Payer: Medicare Other | Admitting: Oncology

## 2016-04-18 ENCOUNTER — Telehealth: Payer: Self-pay

## 2016-04-18 NOTE — Telephone Encounter (Signed)
Spoke to pt, informed him the only other day before his sched appt was 5/11 at 3. He said he could not come that day and kept his June appt.

## 2016-04-18 NOTE — Telephone Encounter (Signed)
Pt. Called to report a "painful knot on side of the throat."  Stated it is sore to touch.  Denied any redness.  Denied fever/ chills.  Denied swallowing problems.  Advised to call his PCP for evaluation of right neck.  Pt. Agreed.  Pt. Requested to move his carotid artery surveillance to an earlier date, since he had to cancel last appt. In Feb.  Stated it hasn't been checked in about 15 mos.  Advised will have a scheduler call pt. To determine if there is an earlier appt. available.

## 2016-05-06 ENCOUNTER — Inpatient Hospital Stay: Payer: Medicare Other

## 2016-05-06 ENCOUNTER — Inpatient Hospital Stay: Payer: Medicare Other | Attending: Oncology | Admitting: Oncology

## 2016-05-06 VITALS — BP 132/73 | HR 62 | Resp 16 | Wt 161.8 lb

## 2016-05-06 DIAGNOSIS — M1990 Unspecified osteoarthritis, unspecified site: Secondary | ICD-10-CM | POA: Diagnosis not present

## 2016-05-06 DIAGNOSIS — D61818 Other pancytopenia: Secondary | ICD-10-CM | POA: Diagnosis not present

## 2016-05-06 DIAGNOSIS — I739 Peripheral vascular disease, unspecified: Secondary | ICD-10-CM | POA: Diagnosis not present

## 2016-05-06 DIAGNOSIS — K219 Gastro-esophageal reflux disease without esophagitis: Secondary | ICD-10-CM | POA: Insufficient documentation

## 2016-05-06 DIAGNOSIS — Z8679 Personal history of other diseases of the circulatory system: Secondary | ICD-10-CM | POA: Diagnosis not present

## 2016-05-06 DIAGNOSIS — Z79899 Other long term (current) drug therapy: Secondary | ICD-10-CM | POA: Diagnosis not present

## 2016-05-06 DIAGNOSIS — Z85828 Personal history of other malignant neoplasm of skin: Secondary | ICD-10-CM | POA: Insufficient documentation

## 2016-05-06 DIAGNOSIS — Z87891 Personal history of nicotine dependence: Secondary | ICD-10-CM | POA: Diagnosis not present

## 2016-05-06 DIAGNOSIS — I2510 Atherosclerotic heart disease of native coronary artery without angina pectoris: Secondary | ICD-10-CM | POA: Insufficient documentation

## 2016-05-06 DIAGNOSIS — Z8546 Personal history of malignant neoplasm of prostate: Secondary | ICD-10-CM

## 2016-05-06 DIAGNOSIS — I10 Essential (primary) hypertension: Secondary | ICD-10-CM | POA: Diagnosis not present

## 2016-05-06 DIAGNOSIS — D649 Anemia, unspecified: Secondary | ICD-10-CM | POA: Insufficient documentation

## 2016-05-06 LAB — CBC WITH DIFFERENTIAL/PLATELET
Basophils Absolute: 0 10*3/uL (ref 0–0.1)
Basophils Relative: 1 %
Eosinophils Absolute: 0.1 10*3/uL (ref 0–0.7)
Eosinophils Relative: 2 %
HCT: 33.6 % — ABNORMAL LOW (ref 40.0–52.0)
Hemoglobin: 11.8 g/dL — ABNORMAL LOW (ref 13.0–18.0)
Lymphocytes Relative: 24 %
Lymphs Abs: 0.9 10*3/uL — ABNORMAL LOW (ref 1.0–3.6)
MCH: 29.1 pg (ref 26.0–34.0)
MCHC: 35 g/dL (ref 32.0–36.0)
MCV: 83.1 fL (ref 80.0–100.0)
Monocytes Absolute: 0.5 10*3/uL (ref 0.2–1.0)
Monocytes Relative: 13 %
Neutro Abs: 2.4 10*3/uL (ref 1.4–6.5)
Neutrophils Relative %: 60 %
Platelets: 56 10*3/uL — ABNORMAL LOW (ref 150–440)
RBC: 4.04 MIL/uL — ABNORMAL LOW (ref 4.40–5.90)
RDW: 14 % (ref 11.5–14.5)
WBC: 4 10*3/uL (ref 3.8–10.6)

## 2016-05-06 NOTE — Progress Notes (Signed)
Patient does not offer any problems today.  

## 2016-05-06 NOTE — Progress Notes (Signed)
[place]  Telephone:(336) [phone] Fax:(336) [phone]  ID: Hale Drone OB: 10-03-1926  MR#: [id number]  XVQ#:[id number]  Patient Care Team: [name], MD as PCP - General (Family Medicine) Manya Silvas, MD (Gastroenterology)  CHIEF COMPLAINT:  Chief Complaint  Patient presents with  . Pancytopenia    INTERVAL HISTORY: Patient returns to clinic today for repeat laboratory work and further evaluation.  He continues to feel well and is asymptomatic.  He denies any easy bleeding or bruising.  He has had no recent fevers or illnesses.  He has a good appetite and denies weight loss.  He has no chest pain, shortness of breath, or cough.  He has no nausea, vomiting, constipation, or diarrhea.  He has no urinary complaints.  Patient offers no specific complaints today.  REVIEW OF SYSTEMS:   Review of Systems  Constitutional: Negative.  Negative for malaise/fatigue.  Respiratory: Negative.   Cardiovascular: Negative.   Neurological: Negative.  Negative for weakness.  Endo/Heme/Allergies: Does not bruise/bleed easily.    As per HPI. Otherwise, a complete review of systems is negatve.  PAST MEDICAL HISTORY: Past Medical History  Diagnosis Date  . Enlarged prostate 2008    Thermotherapy  . Arthritis   . Hypertension   . Psoriasis   . Gastroesophageal reflux disease   . Peripheral vascular disease   . CAD (coronary artery disease)   . Hyperlipidemia   . Adenomatous colon polyp   . Aortic aneurysm 1999    repair  . AAA (abdominal aortic aneurysm) 1999  . Carotid artery occlusion   . Cancer Aug. 2013    prostate Seeding  implant  . Basal cell cancer Dec. 24, 2013    Nose    PAST SURGICAL HISTORY: Past Surgical History  Procedure Laterality Date  . Cholecystectomy    . Cardiac catheterization  10/2009  . Spine surgery  2009    ruptured disc  . Visceral angiogram  05/14/2012    Right  groin area  . Abdominal aortic aneurysm repair  1999  .  Visceral angiogram N/A 05/14/2012    Procedure: VISCERAL ANGIOGRAM;  Surgeon: Elam Dutch, MD;  Location: [facility] CATH LAB;  Service: Cardiovascular;  Laterality: N/A;    FAMILY HISTORY Family History  Problem Relation Age of Onset  . Heart attack Father   . Heart disease Father     After age 65  . COPD Father   . Cancer Sister     Breast and Brain  . Heart disease Brother     before age 19  . Heart attack Brother        ADVANCED DIRECTIVES:    HEALTH MAINTENANCE: Social History  Substance Use Topics  . Smoking status: Former Smoker    Types: Cigarettes    Quit date: 04/29/1997  . Smokeless tobacco: Never Used  . Alcohol Use: No     Allergies  Allergen Reactions  . No Known Allergies     Current Outpatient Prescriptions  Medication Sig Dispense Refill  . felodipine (PLENDIL) 10 MG 24 hr tablet Take 10 mg by mouth daily.    Marland Kitchen glucosamine-chondroitin 500-400 MG tablet Take 1 tablet by mouth 2 (two) times daily.     Marland Kitchen loteprednol (LOTEMAX) 0.5 % ophthalmic suspension INSTILL 1 DROP INTO BOTH EYES EVERY DAY FOR 10 DAYS THEN TAPER    . Multiple Vitamin (MULTIVITAMIN) tablet Take 1 tablet by mouth daily.    . rosuvastatin (CRESTOR) 20 MG tablet Take 20 mg  by mouth daily.    . valsartan-hydrochlorothiazide (DIOVAN-HCT) 320-25 MG per tablet Take 1 tablet by mouth daily.  5   No current facility-administered medications for this visit.    OBJECTIVE: Filed Vitals:   05/06/16 1453  BP: 132/73  Pulse: 62  Resp: 16     Body mass index is 23.89 kg/(m^2).    ECOG FS:0 - Asymptomatic  General: Well-developed, well-nourished, no acute distress. Eyes: Pink conjunctiva, anicteric sclera. Lungs: Clear to auscultation bilaterally. Heart: Regular rate and rhythm. No rubs, murmurs, or gallops. Abdomen: Soft, nontender, nondistended. No organomegaly noted, normoactive bowel sounds. Musculoskeletal: No edema, cyanosis, or clubbing. Neuro: Alert, answering all questions  appropriately. Cranial nerves grossly intact. Skin: No rashes or petechiae noted. Psych: Normal affect.   LAB RESULTS:  Lab Results  Component Value Date   NA 129* 08/04/2015   K 3.5 08/04/2015   CL 93* 08/04/2015   CO2 25 08/04/2015   GLUCOSE 110* 08/04/2015   BUN 15 08/04/2015   CREATININE 1.00 08/04/2015   CALCIUM 9.4 08/04/2015   PROT 6.9 08/04/2015   ALBUMIN 4.9 08/04/2015   AST 35 08/04/2015   ALT 27 08/04/2015   ALKPHOS 51 08/04/2015   BILITOT 0.6 08/04/2015   GFRNONAA >60 08/04/2015   GFRAA >60 08/04/2015    Lab Results  Component Value Date   WBC 4.0 05/06/2016   NEUTROABS 2.4 05/06/2016   HGB 11.8* 05/06/2016   HCT 33.6* 05/06/2016   MCV 83.1 05/06/2016   PLT 56* 05/06/2016     STUDIES: No results found.  ASSESSMENT: Pancytopenia.  PLAN:    1.  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  Patient's platelet count has ranged between 49 and 104 since at least February 2013.The remainder of his blood work was either negative or within normal limits.  To confirm MDS, a bone marrow biopsy would be required.  This is not necessary, since the treatment would be supportive care. WBC WNL at 4.0 today. Platelets 56 and HGB 11.8. Return to clinic in 6 months for laboratory work and further evaluation.   2.  Prostate cancer: Patient is status post seed implantation.  His last PSA on March 19, 2015 was reported as 0.03.   Patient expressed understanding and was in agreement with this plan. He also understands that He can call clinic at any time with any questions, concerns, or complaints.   Mayra Reel, NP   05/06/2016 3:05 PM  Patient was seen and evaluated independently and I agree with the assessment and plan as dictated above.  Lloyd Huger, MD 05/07/2016 4:35 PM

## 2016-06-02 ENCOUNTER — Encounter: Payer: Self-pay | Admitting: Family

## 2016-06-11 ENCOUNTER — Ambulatory Visit: Payer: Medicare Other | Admitting: Family

## 2016-06-11 ENCOUNTER — Ambulatory Visit (HOSPITAL_COMMUNITY): Payer: Medicare Other

## 2016-06-13 ENCOUNTER — Encounter: Payer: Self-pay | Admitting: *Deleted

## 2016-06-16 ENCOUNTER — Ambulatory Visit: Payer: Medicare Other | Admitting: Certified Registered Nurse Anesthetist

## 2016-06-16 ENCOUNTER — Ambulatory Visit
Admission: RE | Admit: 2016-06-16 | Discharge: 2016-06-16 | Disposition: A | Discharge status: Home / Self Care | Payer: Medicare Other | Source: Ambulatory Visit | Attending: Gastroenterology | Admitting: Gastroenterology

## 2016-06-16 ENCOUNTER — Encounter
Admission: RE | Disposition: A | Discharge status: Home / Self Care | Payer: Self-pay | Source: Ambulatory Visit | Attending: Gastroenterology

## 2016-06-16 ENCOUNTER — Encounter: Payer: Self-pay | Admitting: Anesthesiology

## 2016-06-16 DIAGNOSIS — R195 Other fecal abnormalities: Secondary | ICD-10-CM | POA: Diagnosis present

## 2016-06-16 DIAGNOSIS — I2510 Atherosclerotic heart disease of native coronary artery without angina pectoris: Secondary | ICD-10-CM | POA: Insufficient documentation

## 2016-06-16 DIAGNOSIS — Z87891 Personal history of nicotine dependence: Secondary | ICD-10-CM | POA: Diagnosis not present

## 2016-06-16 DIAGNOSIS — Z539 Procedure and treatment not carried out, unspecified reason: Secondary | ICD-10-CM | POA: Diagnosis not present

## 2016-06-16 DIAGNOSIS — I739 Peripheral vascular disease, unspecified: Secondary | ICD-10-CM | POA: Insufficient documentation

## 2016-06-16 DIAGNOSIS — Z79899 Other long term (current) drug therapy: Secondary | ICD-10-CM | POA: Insufficient documentation

## 2016-06-16 DIAGNOSIS — Z86010 Personal history of colonic polyps: Secondary | ICD-10-CM | POA: Insufficient documentation

## 2016-06-16 DIAGNOSIS — M1990 Unspecified osteoarthritis, unspecified site: Secondary | ICD-10-CM | POA: Insufficient documentation

## 2016-06-16 DIAGNOSIS — K219 Gastro-esophageal reflux disease without esophagitis: Secondary | ICD-10-CM | POA: Diagnosis not present

## 2016-06-16 DIAGNOSIS — E785 Hyperlipidemia, unspecified: Secondary | ICD-10-CM | POA: Insufficient documentation

## 2016-06-16 DIAGNOSIS — I10 Essential (primary) hypertension: Secondary | ICD-10-CM | POA: Insufficient documentation

## 2016-06-16 LAB — CBC WITH DIFFERENTIAL/PLATELET
Basophils Absolute: 0 10*3/uL (ref 0–0.1)
Basophils Relative: 0 %
Eosinophils Absolute: 0 10*3/uL (ref 0–0.7)
Eosinophils Relative: 1 %
HCT: 31.5 % — ABNORMAL LOW (ref 40.0–52.0)
Hemoglobin: 11.2 g/dL — ABNORMAL LOW (ref 13.0–18.0)
Lymphocytes Relative: 23 %
Lymphs Abs: 0.7 10*3/uL — ABNORMAL LOW (ref 1.0–3.6)
MCH: 29.6 pg (ref 26.0–34.0)
MCHC: 35.6 g/dL (ref 32.0–36.0)
MCV: 83.3 fL (ref 80.0–100.0)
Monocytes Absolute: 0.7 10*3/uL (ref 0.2–1.0)
Monocytes Relative: 20 %
Neutro Abs: 1.8 10*3/uL (ref 1.4–6.5)
Neutrophils Relative %: 56 %
Platelets: 46 10*3/uL — ABNORMAL LOW (ref 150–440)
RBC: 3.78 MIL/uL — ABNORMAL LOW (ref 4.40–5.90)
RDW: 14.2 % (ref 11.5–14.5)
WBC: 3.3 10*3/uL — ABNORMAL LOW (ref 3.8–10.6)

## 2016-06-16 SURGERY — ESOPHAGOGASTRODUODENOSCOPY (EGD) WITH PROPOFOL
Anesthesia: General

## 2016-06-16 MED ORDER — SODIUM CHLORIDE 0.9 % IV SOLN: INTRAVENOUS | Status: DC

## 2016-06-16 MED ORDER — SODIUM CHLORIDE 0.9 % IV SOLN
INTRAVENOUS | Status: DC
  Administered 2016-06-16: 1000 mL via INTRAVENOUS

## 2016-06-16 NOTE — Pre-Procedure Instructions (Signed)
Patient's procedure not completed due to extremely low platelet count.  Patient and family instructed per Dr. Gustavo Lah.

## 2016-06-16 NOTE — H&P (Addendum)
Outpatient short stay form Pre-procedure 06/16/2016 9:10 AM Thomas Sails MD  Primary Physician: Dr. [name]  Reason for visit:  Heme-positive stool, reported melena in the setting of NSAID use  History of present illness:  Patient is a 82 year old male who was seen in the GI clinic on 06/06/2016. At that time he had developed some epigastric abdominal pain I'll by some black tarry stools. His Hemoccult-positive when seen in the clinic. In taking Advil 4 times a day for many years because of a "bad back. He was previously on a proton pump inhibitor due to a history of Barrett's esophagus however had discontinued that about a year or so ago. He was placed back on Carafate and a proton pump inhibitor. He states currently he is feeling much better. He did have some epigastric pain. His stools have gone back to be normal color.    Current facility-administered medications:  .  0.9 %  sodium chloride infusion, , Intravenous, Continuous, Thomas Sails, MD, Last Rate: 20 mL/hr at 06/16/16 0859, 1,000 mL at 06/16/16 0859 .  0.9 %  sodium chloride infusion, , Intravenous, Continuous, Thomas Sails, MD  Prescriptions prior to admission  Medication Sig Dispense Refill Last Dose  . glucosamine-chondroitin 500-400 MG tablet Take 1 tablet by mouth 2 (two) times daily.    Past Week at Unknown time  . loteprednol (LOTEMAX) 0.5 % ophthalmic suspension INSTILL 1 DROP INTO BOTH EYES EVERY DAY FOR 10 DAYS THEN TAPER   06/15/2016 at Unknown time  . Multiple Vitamin (MULTIVITAMIN) tablet Take 1 tablet by mouth daily.   Past Week at Unknown time  . rosuvastatin (CRESTOR) 20 MG tablet Take 20 mg by mouth daily.   06/15/2016 at Unknown time  . valsartan-hydrochlorothiazide (DIOVAN-HCT) 320-25 MG per tablet Take 1 tablet by mouth daily.  5 06/16/2016 at 0600  . felodipine (PLENDIL) 10 MG 24 hr tablet Take 10 mg by mouth daily.   Taking     Allergies  Allergen Reactions  . Ciprofloxacin   . No Known  Allergies      Past Medical History  Diagnosis Date  . Enlarged prostate 2008    Thermotherapy  . Arthritis   . Hypertension   . Psoriasis   . Gastroesophageal reflux disease   . Peripheral vascular disease ([place])   . CAD (coronary artery disease)   . Hyperlipidemia   . Adenomatous colon polyp   . Aortic aneurysm ([place]) 1999    repair  . AAA (abdominal aortic aneurysm) ([place]) 1999  . Carotid artery occlusion   . Cancer [facility]) Aug. 2013    prostate Seeding  implant  . Basal cell cancer Dec. 24, 2013    Nose    Review of systems:      Physical Exam    Heart and lungs: Regular rate and rhythm without rub or gallop, lungs are bilaterally clear.    HEENT: Normocephalic atraumatic eyes are anicteric    Other:     Pertinant exam for procedure: Soft mild discomfort palpation the epigastric region bowel sounds are positive and normoactive.    Planned proceedures: EGD and indicated procedures. I have discussed the risks benefits and complications of procedures to include not limited to bleeding, infection, perforation and the risk of sedation and the patient wishes to proceed.    Thomas Sails, MD Gastroenterology 06/16/2016  9:10 AM     CBC done showing platelet count of 49.  I have discussed further with family and patient  the risks of bleeding and inability to do biopsies.  Patient states he no longer has abdominal pain and no further dark stools for multiple days.  I have offered the option to Hold on the scope for now plan for repeat platelet count in about a week and proceed at that time. I recommended that he avoid any aspirin or NSAID products in the interim continue on both the proton pump inhibitor, omeprazole, and Carafate in the meantime. He is also to avoid any acidic foods. Patient and his family is in agreement with this.

## 2016-06-16 NOTE — Anesthesia Preprocedure Evaluation (Addendum)
Anesthesia Evaluation  Patient identified by MRN, date of birth, ID band Patient awake    Reviewed: Allergy & Precautions, NPO status , Patient's Chart, lab work & pertinent test results  Airway Mallampati: II  TM Distance: >3 FB     Dental  (+) Chipped, Caps   Pulmonary former smoker,    Pulmonary exam normal breath sounds clear to auscultation       Cardiovascular hypertension, + CAD and + Peripheral Vascular Disease  Normal cardiovascular exam     Neuro/Psych    GI/Hepatic Neg liver ROS, GERD  Medicated and Controlled,Intestinal vascular disease   Endo/Other  negative endocrine ROS  Renal/GU negative Renal ROS     Musculoskeletal  (+) Arthritis , Osteoarthritis,    Abdominal Normal abdominal exam  (+)   Peds  Hematology  (+) anemia ,   Anesthesia Other Findings HTN Reflux AAA Carotid artery Dz reflux  Reproductive/Obstetrics                          Anesthesia Physical Anesthesia Plan  ASA: III  Anesthesia Plan: General   Post-op Pain Management:    Induction: Intravenous  Airway Management Planned: Nasal Cannula  Additional Equipment:   Intra-op Plan:   Post-operative Plan:   Informed Consent: I have reviewed the patients History and Physical, chart, labs and discussed the procedure including the risks, benefits and alternatives for the proposed anesthesia with the patient or authorized representative who has indicated his/her understanding and acceptance.   Dental advisory given  Plan Discussed with: CRNA and Surgeon  Anesthesia Plan Comments:         Anesthesia Quick Evaluation

## 2016-06-17 ENCOUNTER — Telehealth: Payer: Self-pay | Admitting: *Deleted

## 2016-06-17 DIAGNOSIS — D61818 Other pancytopenia: Secondary | ICD-10-CM

## 2016-06-17 NOTE — Telephone Encounter (Signed)
Came into office due to not being able to have EDG yesterday secondary to platelets being 46. He states his count keeps dropping and he needs something done about it. I explained to him per Dr Grayland Ormond this is not going to be a quick fix. He was offered the next available appt which is tomorrow morning at 845 and was asked to be here for lab at 830 He is in agreement with this.  Do you want labs and if so what needs to be drawn?

## 2016-06-18 ENCOUNTER — Inpatient Hospital Stay: Payer: Medicare Other | Attending: Oncology | Admitting: Oncology

## 2016-06-18 ENCOUNTER — Inpatient Hospital Stay: Payer: Medicare Other

## 2016-06-18 VITALS — BP 156/70 | HR 73 | Temp 96.9°F | Resp 18 | Wt 161.4 lb

## 2016-06-18 DIAGNOSIS — Z7952 Long term (current) use of systemic steroids: Secondary | ICD-10-CM

## 2016-06-18 DIAGNOSIS — Z79899 Other long term (current) drug therapy: Secondary | ICD-10-CM | POA: Diagnosis not present

## 2016-06-18 DIAGNOSIS — C61 Malignant neoplasm of prostate: Secondary | ICD-10-CM | POA: Diagnosis not present

## 2016-06-18 DIAGNOSIS — K921 Melena: Secondary | ICD-10-CM | POA: Diagnosis not present

## 2016-06-18 DIAGNOSIS — L409 Psoriasis, unspecified: Secondary | ICD-10-CM | POA: Insufficient documentation

## 2016-06-18 DIAGNOSIS — I10 Essential (primary) hypertension: Secondary | ICD-10-CM | POA: Diagnosis not present

## 2016-06-18 DIAGNOSIS — K635 Polyp of colon: Secondary | ICD-10-CM

## 2016-06-18 DIAGNOSIS — M129 Arthropathy, unspecified: Secondary | ICD-10-CM | POA: Diagnosis not present

## 2016-06-18 DIAGNOSIS — N400 Enlarged prostate without lower urinary tract symptoms: Secondary | ICD-10-CM | POA: Diagnosis not present

## 2016-06-18 DIAGNOSIS — Z808 Family history of malignant neoplasm of other organs or systems: Secondary | ICD-10-CM | POA: Diagnosis not present

## 2016-06-18 DIAGNOSIS — Z803 Family history of malignant neoplasm of breast: Secondary | ICD-10-CM

## 2016-06-18 DIAGNOSIS — K219 Gastro-esophageal reflux disease without esophagitis: Secondary | ICD-10-CM | POA: Diagnosis not present

## 2016-06-18 DIAGNOSIS — Z85828 Personal history of other malignant neoplasm of skin: Secondary | ICD-10-CM

## 2016-06-18 DIAGNOSIS — E785 Hyperlipidemia, unspecified: Secondary | ICD-10-CM | POA: Diagnosis not present

## 2016-06-18 DIAGNOSIS — I409 Acute myocarditis, unspecified: Secondary | ICD-10-CM

## 2016-06-18 DIAGNOSIS — I6529 Occlusion and stenosis of unspecified carotid artery: Secondary | ICD-10-CM | POA: Insufficient documentation

## 2016-06-18 DIAGNOSIS — D61818 Other pancytopenia: Secondary | ICD-10-CM | POA: Diagnosis not present

## 2016-06-18 DIAGNOSIS — I739 Peripheral vascular disease, unspecified: Secondary | ICD-10-CM | POA: Diagnosis not present

## 2016-06-18 DIAGNOSIS — I2510 Atherosclerotic heart disease of native coronary artery without angina pectoris: Secondary | ICD-10-CM | POA: Insufficient documentation

## 2016-06-18 DIAGNOSIS — Z87891 Personal history of nicotine dependence: Secondary | ICD-10-CM | POA: Insufficient documentation

## 2016-06-18 DIAGNOSIS — D696 Thrombocytopenia, unspecified: Secondary | ICD-10-CM

## 2016-06-18 LAB — CBC WITH DIFFERENTIAL/PLATELET
Basophils Absolute: 0 10*3/uL (ref 0–0.1)
Basophils Relative: 1 %
Eosinophils Absolute: 0 10*3/uL (ref 0–0.7)
Eosinophils Relative: 1 %
HCT: 31.8 % — ABNORMAL LOW (ref 40.0–52.0)
Hemoglobin: 11.2 g/dL — ABNORMAL LOW (ref 13.0–18.0)
Lymphocytes Relative: 31 %
Lymphs Abs: 0.9 10*3/uL — ABNORMAL LOW (ref 1.0–3.6)
MCH: 29.5 pg (ref 26.0–34.0)
MCHC: 35.3 g/dL (ref 32.0–36.0)
MCV: 83.6 fL (ref 80.0–100.0)
Monocytes Absolute: 0.5 10*3/uL (ref 0.2–1.0)
Monocytes Relative: 16 %
Neutro Abs: 1.5 10*3/uL (ref 1.4–6.5)
Neutrophils Relative %: 51 %
Platelets: 48 10*3/uL — ABNORMAL LOW (ref 150–440)
RBC: 3.81 MIL/uL — ABNORMAL LOW (ref 4.40–5.90)
RDW: 14.1 % (ref 11.5–14.5)
WBC: 3 10*3/uL — ABNORMAL LOW (ref 3.8–10.6)

## 2016-06-18 MED ORDER — PREDNISONE 20 MG PO TABS: 60.0000 mg | ORAL_TABLET | Freq: Every day | ORAL | Status: DC

## 2016-06-18 NOTE — Progress Notes (Signed)
States is feeling well. Offers no complaints. 

## 2016-06-22 NOTE — Progress Notes (Signed)
[place]  [phone] [phone]  ID: [name] OB: [birth year]  MR#: [id number]  LYY#:[id number]  Patient Care Team: Thomas Pink, MD as PCP - General (Family Medicine) Thomas Silvas, MD (Gastroenterology)  CHIEF COMPLAINT:  Chief Complaint  Patient presents with  . Pancytopenia    INTERVAL HISTORY: Patient returns to clinic today as an add-on for further evaluation and discussion of treatment of his thrombocytopenia. He recently had an episode of melena and GI bleed. EGD was attempted, but never completed secondary to patient's thrombocytopenia. He currently feels well and is asymptomatic. He has had no recent fevers or illnesses.  He has a good appetite and denies weight loss.  He has no chest pain, shortness of breath, or cough.  He has no nausea, vomiting, constipation, or diarrhea.  He has no urinary complaints.  Patient offers no further specific complaints today.  REVIEW OF SYSTEMS:   Review of Systems  Constitutional: Negative.  Negative for fever and malaise/fatigue.  Respiratory: Negative.  Negative for shortness of breath.   Cardiovascular: Negative.  Negative for chest pain.  Gastrointestinal: Positive for blood in stool and melena. Negative for nausea, vomiting and abdominal pain.  Genitourinary: Negative.   Musculoskeletal: Negative.   Neurological: Negative.  Negative for weakness.  Endo/Heme/Allergies: Does not bruise/bleed easily.  Psychiatric/Behavioral: Negative.  The patient is not nervous/anxious.     As per HPI. Otherwise, a complete review of systems is negatve.  PAST MEDICAL HISTORY: Past Medical History  Diagnosis Date  . Enlarged prostate 2008    Thermotherapy  . Arthritis   . Hypertension   . Psoriasis   . Gastroesophageal reflux disease   . Peripheral vascular disease ([place])   . CAD (coronary artery disease)   . Hyperlipidemia   . Adenomatous colon polyp   . Aortic aneurysm ([place]) 1999   repair  . AAA (abdominal aortic aneurysm) ([place]) 1999  . Carotid artery occlusion   . Cancer [facility]) Aug. 2013    prostate Seeding  implant  . Basal cell cancer Dec. 24, 2013    Nose    PAST SURGICAL HISTORY: Past Surgical History  Procedure Laterality Date  . Cholecystectomy    . Cardiac catheterization  10/2009  . Spine surgery  2009    ruptured disc  . Visceral angiogram  05/14/2012    Right  groin area  . Abdominal aortic aneurysm repair  1999  . Visceral angiogram N/A 05/14/2012    Procedure: VISCERAL ANGIOGRAM;  Surgeon: Elam Dutch, MD;  Location: [facility] CATH LAB;  Service: Cardiovascular;  Laterality: N/A;    FAMILY HISTORY Family History  Problem Relation Age of Onset  . Heart attack Father   . Heart disease Father     After age 51  . COPD Father   . Cancer Sister     Breast and Brain  . Heart disease Brother     before age 49  . Heart attack Brother        ADVANCED DIRECTIVES:    HEALTH MAINTENANCE: Social History  Substance Use Topics  . Smoking status: Former Smoker    Types: Cigarettes    Quit date: 04/29/1997  . Smokeless tobacco: Never Used  . Alcohol Use: No     Allergies  Allergen Reactions  . Ciprofloxacin   . No Known Allergies     Current Outpatient Prescriptions  Medication Sig Dispense Refill  . felodipine (PLENDIL) 10 MG 24 hr tablet Take 10 mg by mouth  daily.    . glucosamine-chondroitin 500-400 MG tablet Take 1 tablet by mouth 2 (two) times daily.     Marland Kitchen loteprednol (LOTEMAX) 0.5 % ophthalmic suspension INSTILL 1 DROP INTO BOTH EYES EVERY DAY FOR 10 DAYS THEN TAPER    . Multiple Vitamin (MULTIVITAMIN) tablet Take 1 tablet by mouth daily.    Marland Kitchen omeprazole (PRILOSEC) 40 MG capsule Take 1 capsule by mouth 2 (two) times daily.    . rosuvastatin (CRESTOR) 20 MG tablet Take 20 mg by mouth daily.    . sucralfate (CARAFATE) 1 g tablet Take 1 tablet by mouth 4 (four) times daily.    . valsartan-hydrochlorothiazide (DIOVAN-HCT) 320-25 MG per  tablet Take 1 tablet by mouth daily.  5  . predniSONE (DELTASONE) 20 MG tablet Take 3 tablets (60 mg total) by mouth daily with breakfast. 21 tablet 0   No current facility-administered medications for this visit.    OBJECTIVE: Filed Vitals:   06/18/16 0856  BP: 156/70  Pulse: 73  Temp: 96.9 F (36.1 C)  Resp: 18     Body mass index is 23.82 kg/(m^2).    ECOG FS:0 - Asymptomatic  General: Well-developed, well-nourished, no acute distress. Eyes: Meyer conjunctiva, anicteric sclera. Lungs: Clear to auscultation bilaterally. Heart: Regular rate and rhythm. No rubs, murmurs, or gallops. Abdomen: Soft, nontender, nondistended. No organomegaly noted, normoactive bowel sounds. Musculoskeletal: No edema, cyanosis, or clubbing. Neuro: Alert, answering all questions appropriately. Cranial nerves grossly intact. Skin: No rashes or petechiae noted. Psych: Normal affect.   LAB RESULTS:  Lab Results  Component Value Date   NA 129* 08/04/2015   K 3.5 08/04/2015   CL 93* 08/04/2015   CO2 25 08/04/2015   GLUCOSE 110* 08/04/2015   BUN 15 08/04/2015   CREATININE 1.00 08/04/2015   CALCIUM 9.4 08/04/2015   PROT 6.9 08/04/2015   ALBUMIN 4.9 08/04/2015   AST 35 08/04/2015   ALT 27 08/04/2015   ALKPHOS 51 08/04/2015   BILITOT 0.6 08/04/2015   GFRNONAA >60 08/04/2015   GFRAA >60 08/04/2015    Lab Results  Component Value Date   WBC 3.0* 06/18/2016   NEUTROABS 1.5 06/18/2016   HGB 11.2* 06/18/2016   HCT 31.8* 06/18/2016   MCV 83.6 06/18/2016   PLT 48* 06/18/2016     STUDIES: No results found.  ASSESSMENT: Pancytopenia.  PLAN:    1.  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  Patient's platelet count has ranged between 49 and 104 since at least February 2013.The remainder of his blood work was either negative or within normal limits.  To confirm MDS, a bone marrow biopsy would be required.  2.  Prostate cancer: Patient is status post seed  implantation.  His last PSA on March 19, 2015 was reported as 0.03.  3. Thrombocytopenia: Patient was given high-dose steroids for one week to determine if there is any improvement in his platelet count. Patient has blood draw at his GI physician in 1 week and has been instructed to ensure the results are faxed. If prednisone does not work, patient may require a platelet transfusion. Ultimately, he may need a bone marrow biopsy to confirm the diagnosis. 4. GI bleed: EGD is scheduled provided patient's platelet count is greater than 50. 5. Follow-up: Patient was instructed to follow-up as previously scheduled.  Patient expressed understanding and was in agreement with this plan. He also understands that He can call clinic at any time with any questions, concerns, or complaints.  Lloyd Huger, MD   06/22/2016 3:01 PM

## 2016-06-26 ENCOUNTER — Ambulatory Visit: Payer: Medicare Other | Admitting: Anesthesiology

## 2016-06-26 ENCOUNTER — Encounter
Admission: RE | Disposition: A | Discharge status: Home / Self Care | Payer: Self-pay | Source: Ambulatory Visit | Attending: Gastroenterology

## 2016-06-26 ENCOUNTER — Encounter: Payer: Self-pay | Admitting: *Deleted

## 2016-06-26 ENCOUNTER — Ambulatory Visit
Admission: RE | Admit: 2016-06-26 | Discharge: 2016-06-26 | Disposition: A | Discharge status: Home / Self Care | Payer: Medicare Other | Source: Ambulatory Visit | Attending: Gastroenterology | Admitting: Gastroenterology

## 2016-06-26 DIAGNOSIS — K319 Disease of stomach and duodenum, unspecified: Secondary | ICD-10-CM | POA: Diagnosis not present

## 2016-06-26 DIAGNOSIS — K2270 Barrett's esophagus without dysplasia: Secondary | ICD-10-CM | POA: Diagnosis not present

## 2016-06-26 DIAGNOSIS — I6529 Occlusion and stenosis of unspecified carotid artery: Secondary | ICD-10-CM | POA: Diagnosis not present

## 2016-06-26 DIAGNOSIS — Z85828 Personal history of other malignant neoplasm of skin: Secondary | ICD-10-CM | POA: Diagnosis not present

## 2016-06-26 DIAGNOSIS — K219 Gastro-esophageal reflux disease without esophagitis: Secondary | ICD-10-CM | POA: Insufficient documentation

## 2016-06-26 DIAGNOSIS — N400 Enlarged prostate without lower urinary tract symptoms: Secondary | ICD-10-CM | POA: Insufficient documentation

## 2016-06-26 DIAGNOSIS — K449 Diaphragmatic hernia without obstruction or gangrene: Secondary | ICD-10-CM | POA: Diagnosis not present

## 2016-06-26 DIAGNOSIS — I2510 Atherosclerotic heart disease of native coronary artery without angina pectoris: Secondary | ICD-10-CM | POA: Insufficient documentation

## 2016-06-26 DIAGNOSIS — E785 Hyperlipidemia, unspecified: Secondary | ICD-10-CM | POA: Diagnosis not present

## 2016-06-26 DIAGNOSIS — I739 Peripheral vascular disease, unspecified: Secondary | ICD-10-CM | POA: Insufficient documentation

## 2016-06-26 DIAGNOSIS — Z79899 Other long term (current) drug therapy: Secondary | ICD-10-CM | POA: Diagnosis not present

## 2016-06-26 DIAGNOSIS — R195 Other fecal abnormalities: Secondary | ICD-10-CM | POA: Diagnosis not present

## 2016-06-26 DIAGNOSIS — R1033 Periumbilical pain: Secondary | ICD-10-CM | POA: Diagnosis present

## 2016-06-26 DIAGNOSIS — K2970 Gastritis, unspecified, without bleeding: Secondary | ICD-10-CM | POA: Diagnosis not present

## 2016-06-26 DIAGNOSIS — M1990 Unspecified osteoarthritis, unspecified site: Secondary | ICD-10-CM | POA: Diagnosis not present

## 2016-06-26 DIAGNOSIS — Z881 Allergy status to other antibiotic agents status: Secondary | ICD-10-CM | POA: Insufficient documentation

## 2016-06-26 HISTORY — PX: ESOPHAGOGASTRODUODENOSCOPY (EGD) WITH PROPOFOL: SHX5813

## 2016-06-26 LAB — CBC
HCT: 35.6 % — ABNORMAL LOW (ref 40.0–52.0)
Hemoglobin: 12.5 g/dL — ABNORMAL LOW (ref 13.0–18.0)
MCH: 29.6 pg (ref 26.0–34.0)
MCHC: 35.2 g/dL (ref 32.0–36.0)
MCV: 84 fL (ref 80.0–100.0)
Platelets: 105 10*3/uL — ABNORMAL LOW (ref 150–440)
RBC: 4.24 MIL/uL — ABNORMAL LOW (ref 4.40–5.90)
RDW: 14.1 % (ref 11.5–14.5)
WBC: 6.9 10*3/uL (ref 3.8–10.6)

## 2016-06-26 SURGERY — ESOPHAGOGASTRODUODENOSCOPY (EGD) WITH PROPOFOL
Anesthesia: General

## 2016-06-26 MED ORDER — SODIUM CHLORIDE 0.9 % IV SOLN
INTRAVENOUS | Status: DC
Start: 2016-06-26 — End: 2016-06-26

## 2016-06-26 MED ORDER — PROPOFOL 500 MG/50ML IV EMUL
INTRAVENOUS | Status: DC | PRN
  Administered 2016-06-26: 110 ug/kg/min via INTRAVENOUS

## 2016-06-26 MED ORDER — FENTANYL CITRATE (PF) 100 MCG/2ML IJ SOLN
25.0000 ug | INTRAMUSCULAR | Status: DC | PRN
  Administered 2016-06-26: 50 ug via INTRAVENOUS

## 2016-06-26 MED ORDER — ONDANSETRON HCL 4 MG/2ML IJ SOLN: 4.0000 mg | Freq: Once | INTRAMUSCULAR | Status: DC | PRN

## 2016-06-26 MED ORDER — PROPOFOL 10 MG/ML IV BOLUS
INTRAVENOUS | Status: DC | PRN
  Administered 2016-06-26: 20 mg via INTRAVENOUS

## 2016-06-26 MED ORDER — EPHEDRINE SULFATE 50 MG/ML IJ SOLN
INTRAMUSCULAR | Status: DC | PRN
  Administered 2016-06-26: 50 mg via INTRAVENOUS

## 2016-06-26 MED ORDER — SODIUM CHLORIDE 0.9 % IV SOLN
INTRAVENOUS | Status: DC
  Administered 2016-06-26: 10:00:00 via INTRAVENOUS

## 2016-06-26 NOTE — Op Note (Signed)
[name] Gastroenterology Patient Name: [name] Procedure Date: 06/26/2016 11:06 AM MRN: [id number] Account #: [id number] Date of Birth: 1926-07-03 Admit Type: Outpatient Age: 82 Room: [facility] ENDO ROOM 3 Gender: Male Note Status: Finalized Procedure:            Upper GI endoscopy Indications:          Periumbilical abdominal pain, Heme positive stool Providers:            Lollie Sails, MD Referring MD:         Irven Easterly. Kary Kos, MD (Referring MD) Medicines:            Monitored Anesthesia Care Complications:        No immediate complications. Procedure:            Pre-Anesthesia Assessment:                       - ASA Grade Assessment: III - A patient with severe                        systemic disease.                       After obtaining informed consent, the endoscope was                        passed under direct vision. Throughout the procedure,                        the patient's blood pressure, pulse, and oxygen                        saturations were monitored continuously. The Endoscope                        was introduced through the mouth, and advanced to the                        third to 4th part of duodenum. The upper GI endoscopy                        was accomplished without difficulty. The patient                        tolerated the procedure well. Findings:      The Z-line was irregular.      There were esophageal mucosal changes secondary to established       short-segment Barrett's disease present at the gastroesophageal       junction. The maximum longitudinal extent of these mucosal changes was 1       cm in length. Mucosa was biopsied with a cold forceps for histology in 4       quadrants.      The exam of the esophagus was otherwise normal.      Diffuse mild inflammation characterized by congestion (edema) and       granularity was found in the gastric body. Biopsies were taken with a       cold forceps for histology.  Biopsies were taken with a cold forceps for       Helicobacter pylori testing.      The cardia and gastric fundus were  normal on retroflexion.      The examined duodenum was normal. There is no evidence of a bleeding       lesion or stigmata of recent bleeding.      A small hiatal hernia was present. Impression:           - Z-line irregular.                       - Esophageal mucosal changes secondary to established                        short-segment Barrett's disease. Biopsied.                       - Gastritis. Biopsied.                       - Normal examined duodenum. Recommendation:       - Use Aciphex (rabeprazole) 20 mg PO daily daily.                       - Perform a CT scan (computed tomography) of abdomen                        with contrast and pelvis with contrast at appointment                        to be scheduled. Procedure Code(s):    --- Professional ---                       [phone], Esophagogastroduodenoscopy, flexible, transoral;                        with biopsy, single or multiple Diagnosis Code(s):    --- Professional ---                       K22.8, Other specified diseases of esophagus                       K22.70, Barrett's esophagus without dysplasia                       K29.70, Gastritis, unspecified, without bleeding                       [id number], Periumbilical pain                       R19.5, Other fecal abnormalities CPT copyright 2016 American Medical Association. All rights reserved. The codes documented in this report are preliminary and upon coder review may  be revised to meet current compliance requirements. Lollie Sails, MD 06/26/2016 11:34:27 AM This report has been signed electronically. Number of Addenda: 0 Note Initiated On: 06/26/2016 11:06 AM      [facility]

## 2016-06-26 NOTE — Transfer of Care (Signed)
Immediate Anesthesia Transfer of Care Note  Patient: [name]  Procedure(s) Performed: Procedure(s): ESOPHAGOGASTRODUODENOSCOPY (EGD) WITH PROPOFOL (N/A)  Patient Location: PACU  Anesthesia Type:General  Level of Consciousness: awake  Airway & Oxygen Therapy: Patient Spontanous Breathing and Patient connected to nasal cannula oxygen  Post-op Assessment: Report given to RN  Post vital signs: Reviewed  Last Vitals:  Filed Vitals:   06/26/16 0904  BP: 140/75  Pulse: 66  Temp: 36.1 C  Resp: 18    Last Pain: There were no vitals filed for this visit.       Complications: No apparent anesthesia complications

## 2016-06-26 NOTE — Anesthesia Postprocedure Evaluation (Signed)
Anesthesia Post Note  Patient: [name]  Procedure(s) Performed: Procedure(s) (LRB): ESOPHAGOGASTRODUODENOSCOPY (EGD) WITH PROPOFOL (N/A)  Patient location during evaluation: PACU Anesthesia Type: General Level of consciousness: awake Pain management: pain level controlled Vital Signs Assessment: post-procedure vital signs reviewed and stable Respiratory status: spontaneous breathing Cardiovascular status: stable Anesthetic complications: no    Last Vitals:  Filed Vitals:   06/26/16 0904  BP: 140/75  Pulse: 66  Temp: 36.1 C  Resp: 18    Last Pain: There were no vitals filed for this visit.               VAN STAVEREN,Sarahjane Matherly

## 2016-06-26 NOTE — Anesthesia Preprocedure Evaluation (Signed)
Anesthesia Evaluation  Patient identified by MRN, date of birth, ID band Patient awake    History of Anesthesia Complications (+) history of anesthetic complications  Airway Mallampati: II       Dental  (+) Teeth Intact   Pulmonary neg pulmonary ROS, former smoker,    breath sounds clear to auscultation       Cardiovascular Exercise Tolerance: Good hypertension, Pt. on medications + CAD and + Peripheral Vascular Disease   Rhythm:Regular Rate:Normal     Neuro/Psych negative neurological ROS     GI/Hepatic Neg liver ROS, GERD  Medicated,  Endo/Other  negative endocrine ROS  Renal/GU negative Renal ROS     Musculoskeletal   Abdominal Normal abdominal exam  (+)   Peds  Hematology  (+) anemia ,   Anesthesia Other Findings   Reproductive/Obstetrics                             Anesthesia Physical Anesthesia Plan  ASA: III  Anesthesia Plan: General   Post-op Pain Management:    Induction: Intravenous  Airway Management Planned: Natural Airway and Nasal Cannula  Additional Equipment:   Intra-op Plan:   Post-operative Plan:   Informed Consent: I have reviewed the patients History and Physical, chart, labs and discussed the procedure including the risks, benefits and alternatives for the proposed anesthesia with the patient or authorized representative who has indicated his/her understanding and acceptance.     Plan Discussed with: CRNA  Anesthesia Plan Comments:         Anesthesia Quick Evaluation

## 2016-06-26 NOTE — H&P (Signed)
Outpatient short stay form Pre-procedure 06/26/2016 10:58 AM Lollie Sails MD  Primary Physician: Dr. [name]  Reason for visit:  EGD  History of present illness:  Patient is a 82 year old male presenting today for EGD. He was actually seen on 06/16/2016 for this procedure however at that time had a low platelet count, in the 40s, and he was sent to hematology for further evaluation. He was given a course of steroids there which is increased his platelet count he is presenting today for his procedure. On presentation today he does state however that he is also seeing some black stool within the past day or 2. He denies any recent NSAIDs or aspirin products for about a month. Review CT was taking multiple doses of ibuprofen daily. Patient does have a history of abdominal aortic aneurysm repair in 1999.    Current facility-administered medications:  .  0.9 %  sodium chloride infusion, , Intravenous, Continuous, Lollie Sails, MD, Last Rate: 20 mL/hr at 06/26/16 1015 .  0.9 %  sodium chloride infusion, , Intravenous, Continuous, Lollie Sails, MD .  fentaNYL (SUBLIMAZE) injection 25 mcg, 25 mcg, Intravenous, Q5 min PRN, Alvin Critchley, MD .  ondansetron [facility]) injection 4 mg, 4 mg, Intravenous, Once PRN, Alvin Critchley, MD  Prescriptions prior to admission  Medication Sig Dispense Refill Last Dose  . felodipine (PLENDIL) 10 MG 24 hr tablet Take 10 mg by mouth daily.   06/25/2016 at Unknown time  . glucosamine-chondroitin 500-400 MG tablet Take 1 tablet by mouth 2 (two) times daily.    Past Week at Unknown time  . Multiple Vitamin (MULTIVITAMIN) tablet Take 1 tablet by mouth daily.   Past Week at Unknown time  . omeprazole (PRILOSEC) 40 MG capsule Take 1 capsule by mouth 2 (two) times daily.   06/25/2016 at Unknown time  . rosuvastatin (CRESTOR) 20 MG tablet Take 20 mg by mouth daily.   06/25/2016 at Unknown time  . sucralfate (CARAFATE) 1 g tablet Take 1 tablet by mouth 4 (four) times  daily.   Past Week at Unknown time  . valsartan-hydrochlorothiazide (DIOVAN-HCT) 320-25 MG per tablet Take 1 tablet by mouth daily.  5 06/25/2016 at Unknown time  . loteprednol (LOTEMAX) 0.5 % ophthalmic suspension Reported on 06/26/2016   Not Taking at Unknown time  . predniSONE (DELTASONE) 20 MG tablet Take 3 tablets (60 mg total) by mouth daily with breakfast. (Patient not taking: Reported on 06/26/2016) 21 tablet 0 Completed Course at Unknown time     Allergies  Allergen Reactions  . Ciprofloxacin   . No Known Allergies      Past Medical History  Diagnosis Date  . Enlarged prostate 2008    Thermotherapy  . Arthritis   . Hypertension   . Psoriasis   . Gastroesophageal reflux disease   . Peripheral vascular disease ([place])   . CAD (coronary artery disease)   . Hyperlipidemia   . Adenomatous colon polyp   . Aortic aneurysm ([place]) 1999    repair  . AAA (abdominal aortic aneurysm) ([place]) 1999  . Carotid artery occlusion   . Cancer [facility]) Aug. 2013    prostate Seeding  implant  . Basal cell cancer Dec. 24, 2013    Nose    Review of systems:      Physical Exam    Heart and lungs: Regular rate and rhythm without rub or gallop, lungs are bilaterally clear.    HEENT: Normocephalic atraumatic eyes are anicteric  Other:     Pertinant exam for procedure: Soft mild tenderness around the periumbilical region there is no rebound or masses. Bowel sounds are positive.    Planned proceedures: EGD and indicated procedures. Of note patient also has a history of Barrett's esophagus.Patient did have a CBC today showing a white count of 6.9 H&H 12.5 or 35.6 and a platelet count of 105.    Lollie Sails, MD Gastroenterology 06/26/2016  10:58 AM

## 2016-06-27 ENCOUNTER — Encounter: Payer: Self-pay | Admitting: Gastroenterology

## 2016-06-27 ENCOUNTER — Other Ambulatory Visit: Payer: Self-pay | Admitting: Gastroenterology

## 2016-06-27 DIAGNOSIS — R1033 Periumbilical pain: Secondary | ICD-10-CM

## 2016-06-30 LAB — SURGICAL PATHOLOGY

## 2016-07-09 ENCOUNTER — Ambulatory Visit
Admission: RE | Admit: 2016-07-09 | Discharge: 2016-07-09 | Disposition: A | Discharge status: Home / Self Care | Payer: Medicare Other | Source: Ambulatory Visit | Attending: Gastroenterology | Admitting: Gastroenterology

## 2016-07-09 DIAGNOSIS — R1033 Periumbilical pain: Secondary | ICD-10-CM | POA: Insufficient documentation

## 2016-07-09 DIAGNOSIS — K5900 Constipation, unspecified: Secondary | ICD-10-CM | POA: Diagnosis not present

## 2016-07-09 DIAGNOSIS — N281 Cyst of kidney, acquired: Secondary | ICD-10-CM | POA: Insufficient documentation

## 2016-07-09 DIAGNOSIS — I714 Abdominal aortic aneurysm, without rupture: Secondary | ICD-10-CM | POA: Diagnosis not present

## 2016-07-09 DIAGNOSIS — I2510 Atherosclerotic heart disease of native coronary artery without angina pectoris: Secondary | ICD-10-CM | POA: Insufficient documentation

## 2016-07-09 DIAGNOSIS — K769 Liver disease, unspecified: Secondary | ICD-10-CM | POA: Diagnosis not present

## 2016-07-09 LAB — POCT I-STAT CREATININE: Creatinine, Ser: 1.1 mg/dL (ref 0.61–1.24)

## 2016-07-09 MED ORDER — IOPAMIDOL (ISOVUE-300) INJECTION 61%
100.0000 mL | Freq: Once | INTRAVENOUS | Status: AC | PRN
  Administered 2016-07-09: 100 mL via INTRAVENOUS

## 2016-07-10 ENCOUNTER — Telehealth: Payer: Self-pay

## 2016-07-10 ENCOUNTER — Encounter: Payer: Self-pay | Admitting: Vascular Surgery

## 2016-07-10 NOTE — Telephone Encounter (Signed)
Phone call from pt.  Reported he had a CT scan yesterday, and was advised there are changes in the AAA, and needed an appt. with Vascular Surgery ASAP.  Noted CT Abd./ Pelvis done 7/19 for abdominal pain.  Discussed with Dr. Donnetta Hutching; CT Abd/ pelvis report reviewed by Dr. Donnetta Hutching.  Advised to schedule pt. for appt. with Dr.  Oneida Alar next week.  Notified pt. that he will be contacted with an appt. for next week.  Verb. Understanding.

## 2016-07-10 NOTE — Telephone Encounter (Signed)
Sched appt 7/27 at 9:15. Spoke to pt to inform them of appt.

## 2016-07-17 ENCOUNTER — Ambulatory Visit (INDEPENDENT_AMBULATORY_CARE_PROVIDER_SITE_OTHER): Payer: Medicare Other | Admitting: Vascular Surgery

## 2016-07-17 ENCOUNTER — Encounter: Payer: Self-pay | Admitting: Vascular Surgery

## 2016-07-17 ENCOUNTER — Other Ambulatory Visit: Payer: Self-pay

## 2016-07-17 VITALS — BP 137/80 | HR 73 | Temp 97.4°F | Resp 16 | Ht 69.0 in | Wt 161.0 lb

## 2016-07-17 DIAGNOSIS — I714 Abdominal aortic aneurysm, without rupture, unspecified: Secondary | ICD-10-CM

## 2016-07-17 NOTE — Progress Notes (Signed)
Patient is an 82 year old male that we have been following in the past for bilateral asymptomatic moderate carotid stenosis. 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 The patient had repair of an abdominal aortic aneurysm in 1999 at [facility]. He had a CT scan recently for evaluation of abdominal pain. This showed a 15 mm pseudoaneurysm at the distal anastomosis remainder of the graft was intact.  He still is experiencing some abdominal pain but overall this is improved. He did have an upper endoscopy and it was thought that potentially his symptoms were related to either esophagitis or gastritis. He did not have any ulcers. I evaluated the patient in 2013 with a mesenteric angiogram which was essentially negative.  He has had no weight loss. He has previously had a small bowel obstruction. Patient denies any fever or chills or other constitutional symptoms to suggest aortic graft infection.   He is overall very active and does frequent home workouts.  He denies any claudication symptoms. He denies any symptoms of TIA amaurosis or stroke.  Other medical problems include recent diagnosis of prostate cancer. He also has multiple joint arthritis, hypertension, reflux, coronary disease, hyperlipidemia.  These are currently controlled.    He also has a history of thrombocytopenia but states his platelet count is usually improved with short courses of steroids for procedures.          Past Medical History  Diagnosis Date  . Enlarged prostate 2008      Thermotherapy  . Arthritis    . Hypertension    . Psoriasis    . Gastroesophageal reflux disease    . Peripheral vascular disease    . CAD (coronary artery disease)    . Hyperlipidemia    . Adenomatous colon polyp    . Aortic aneurysm 1999      repair  . Carotid artery occlusion    . AAA (abdominal aortic aneurysm) 1999  . Cancer        prostate           Past Surgical History  Procedure Date  .  Cholecystectomy    . Cardiac catheterization 10/2009  . Spine surgery 2009      ruptured disc  . Abdominal aortic aneurysm repair 199        Social History      History  Substance Use Topics  . Smoking status: Former Smoker      Types: Cigarettes      Quit date: 04/29/1997  . Smokeless tobacco: Never Used  . Alcohol Use: No      Family History       Family History  Problem Relation Age of Onset  . Heart attack Father    . Cancer Sister    . Heart disease Brother        before age 57   . Heart attack Brother        Allergies   No Known Allergies     Current Outpatient Prescriptions on File Prior to Visit  Medication Sig Dispense Refill  . felodipine (PLENDIL) 10 MG 24 hr tablet Take 10 mg by mouth daily.    Marland Kitchen glucosamine-chondroitin 500-400 MG tablet Take 1 tablet by mouth 2 (two) times daily.     Marland Kitchen loteprednol (LOTEMAX) 0.5 % ophthalmic suspension Reported on 06/26/2016    . Multiple Vitamin (MULTIVITAMIN) tablet Take 1 tablet by mouth daily.    . predniSONE (DELTASONE) 20 MG tablet Take 3 tablets (60 mg total)  by mouth daily with breakfast. 21 tablet 0  . rosuvastatin (CRESTOR) 20 MG tablet Take 20 mg by mouth daily.    . valsartan-hydrochlorothiazide (DIOVAN-HCT) 320-25 MG per tablet Take 1 tablet by mouth daily.  5   No current facility-administered medications on file prior to visit.      ROS:    General:  No weight loss, Fever, chills   HEENT: No recent headaches, no nasal bleeding, no visual changes, no sore throat   Neurologic: No dizziness, blackouts, seizures. No recent symptoms of stroke or mini- stroke. No recent episodes of slurred speech, or temporary blindness.   Cardiac: No recent episodes of chest pain/pressure, no shortness of breath at rest.  No shortness of breath with exertion.  Denies history of atrial fibrillation or irregular heartbeat  He had a cardiac stress test 9 months ago which was negative per the patient. He is followed by Dr.  Nehemiah Massed.   Vascular: No history of rest pain in feet.  No history of claudication.  No history of non-healing ulcer, No history of DVT    Pulmonary: No home oxygen, no productive cough, no hemoptysis,  No asthma or wheezing   Musculoskeletal:  [x ] Arthritis, [ ]  Low back pain,  [ ]  Joint pain   Hematologic:No history of hypercoagulable state.  No history of easy bleeding.  No history of anemia   Gastrointestinal:  + gastroesophageal reflux, no trouble swallowing   Urinary: [ ]  chronic Kidney disease, [ ]  on HD - [ ]  MWF or [ ]  TTHS, [ ]  Burning with urination, [ ]  Frequent urination, [ ]  Difficulty urinating;    Skin: No rashes   Psychological: No history of anxiety,  No history of depression     Physical Examination   Vitals:   07/17/16 0910 07/17/16 0913  BP: 129/72 137/80  Pulse: 73   Resp: 16   Temp: 97.4 F (36.3 C)   TempSrc: Oral   SpO2: 99%   Weight: 161 lb (73 kg)   Height: 5\' 9"  (1.753 m)     General:  Alert and oriented, no acute distress HEENT: Normal Neck: No bruit or JVD Pulmonary: Clear to auscultation bilaterally Cardiac: Regular Rate and Rhythm without murmur Abdomen: Soft, non-tender, non-distended, no mass, transverse abdominal scar, prominent aortic pulsation just left of the umbilicus, audible bruit epigastric Skin: No rash Extremity Pulses:  2+ radial, brachial, femoral bilaterally Musculoskeletal: No deformity or edema                Neurologic: Upper and lower extremity motor 5/5 and symmetric   DATA:   I reviewed the patient's CT scan findings as listed per history of present illness. Of note the patient's celiac and superior mesenteric arteries were widely patent.   ASSESSMENT:    1 distal anastomotic pseudoaneurysm from previous aortic aneurysm repair #2 stable bilateral moderate carotid stenosis     PLAN:    Endovascular lining of his previous graft to seal the anastomotic pseudoaneurysm.  This will be scheduled in the next few weeks. He  has follow-up scheduled with his cardiologist in the near future. If the patient has significant thrombocytopenia we may need to get a hematology consult perioperatively  [name], MD [facility]: [phone] Pager: [phone]

## 2016-07-23 DIAGNOSIS — I714 Abdominal aortic aneurysm, without rupture, unspecified: Secondary | ICD-10-CM | POA: Insufficient documentation

## 2016-07-29 ENCOUNTER — Encounter (HOSPITAL_COMMUNITY): Payer: Self-pay

## 2016-07-29 ENCOUNTER — Encounter (HOSPITAL_COMMUNITY)
Admission: RE | Admit: 2016-07-29 | Discharge: 2016-07-29 | Disposition: A | Discharge status: Home / Self Care | Payer: Medicare Other | Source: Ambulatory Visit | Attending: Vascular Surgery | Admitting: Vascular Surgery

## 2016-07-29 DIAGNOSIS — N400 Enlarged prostate without lower urinary tract symptoms: Secondary | ICD-10-CM | POA: Diagnosis not present

## 2016-07-29 DIAGNOSIS — Z79899 Other long term (current) drug therapy: Secondary | ICD-10-CM | POA: Insufficient documentation

## 2016-07-29 DIAGNOSIS — Z87891 Personal history of nicotine dependence: Secondary | ICD-10-CM | POA: Insufficient documentation

## 2016-07-29 DIAGNOSIS — Z7952 Long term (current) use of systemic steroids: Secondary | ICD-10-CM | POA: Diagnosis not present

## 2016-07-29 DIAGNOSIS — Z01818 Encounter for other preprocedural examination: Secondary | ICD-10-CM | POA: Insufficient documentation

## 2016-07-29 DIAGNOSIS — I714 Abdominal aortic aneurysm, without rupture: Secondary | ICD-10-CM | POA: Insufficient documentation

## 2016-07-29 DIAGNOSIS — Z8546 Personal history of malignant neoplasm of prostate: Secondary | ICD-10-CM | POA: Diagnosis not present

## 2016-07-29 DIAGNOSIS — I10 Essential (primary) hypertension: Secondary | ICD-10-CM | POA: Diagnosis not present

## 2016-07-29 DIAGNOSIS — L409 Psoriasis, unspecified: Secondary | ICD-10-CM | POA: Insufficient documentation

## 2016-07-29 DIAGNOSIS — Z0183 Encounter for blood typing: Secondary | ICD-10-CM | POA: Diagnosis not present

## 2016-07-29 DIAGNOSIS — E785 Hyperlipidemia, unspecified: Secondary | ICD-10-CM | POA: Diagnosis not present

## 2016-07-29 DIAGNOSIS — I739 Peripheral vascular disease, unspecified: Secondary | ICD-10-CM | POA: Insufficient documentation

## 2016-07-29 DIAGNOSIS — Z85828 Personal history of other malignant neoplasm of skin: Secondary | ICD-10-CM | POA: Insufficient documentation

## 2016-07-29 DIAGNOSIS — K219 Gastro-esophageal reflux disease without esophagitis: Secondary | ICD-10-CM | POA: Insufficient documentation

## 2016-07-29 DIAGNOSIS — I2510 Atherosclerotic heart disease of native coronary artery without angina pectoris: Secondary | ICD-10-CM | POA: Diagnosis not present

## 2016-07-29 DIAGNOSIS — Z01812 Encounter for preprocedural laboratory examination: Secondary | ICD-10-CM | POA: Insufficient documentation

## 2016-07-29 HISTORY — DX: Other pancytopenia: D61.818

## 2016-07-29 LAB — URINALYSIS, ROUTINE W REFLEX MICROSCOPIC
Bilirubin Urine: NEGATIVE
Glucose, UA: NEGATIVE mg/dL
Hgb urine dipstick: NEGATIVE
Ketones, ur: NEGATIVE mg/dL
Leukocytes, UA: NEGATIVE
Nitrite: NEGATIVE
Protein, ur: NEGATIVE mg/dL
Specific Gravity, Urine: 1.012 (ref 1.005–1.030)
pH: 6.5 (ref 5.0–8.0)

## 2016-07-29 LAB — COMPREHENSIVE METABOLIC PANEL
ALT: 21 U/L (ref 17–63)
AST: 27 U/L (ref 15–41)
Albumin: 4.2 g/dL (ref 3.5–5.0)
Alkaline Phosphatase: 48 U/L (ref 38–126)
Anion gap: 10 (ref 5–15)
BUN: 13 mg/dL (ref 6–20)
CO2: 22 mmol/L (ref 22–32)
Calcium: 9.4 mg/dL (ref 8.9–10.3)
Chloride: 99 mmol/L — ABNORMAL LOW (ref 101–111)
Creatinine, Ser: 0.84 mg/dL (ref 0.61–1.24)
GFR calc Af Amer: >60 mL/min (ref >60)
GFR calc non Af Amer: >60 mL/min (ref >60)
Glucose, Bld: 106 mg/dL — ABNORMAL HIGH (ref 65–99)
Potassium: 3.7 mmol/L (ref 3.5–5.1)
Sodium: 131 mmol/L — ABNORMAL LOW (ref 135–145)
Total Bilirubin: 0.6 mg/dL (ref 0.3–1.2)
Total Protein: 6.1 g/dL — ABNORMAL LOW (ref 6.5–8.1)

## 2016-07-29 LAB — BLOOD GAS, ARTERIAL
Acid-Base Excess: 1 mmol/L (ref 0.0–2.0)
Bicarbonate: 24 mEq/L (ref 20.0–24.0)
Drawn by: 206361
FIO2: 0.21
O2 Saturation: 97.1 %
Patient temperature: 98.6
TCO2: 25 mmol/L (ref 0–100)
pCO2 arterial: 31.5 mmHg — ABNORMAL LOW (ref 35.0–45.0)
pH, Arterial: 7.495 — ABNORMAL HIGH (ref 7.350–7.450)
pO2, Arterial: 83.8 mmHg (ref 80.0–100.0)

## 2016-07-29 LAB — TYPE AND SCREEN
ABO/RH(D): A POS
Antibody Screen: NEGATIVE

## 2016-07-29 LAB — CBC
HCT: 32.8 % — ABNORMAL LOW (ref 39.0–52.0)
Hemoglobin: 10.7 g/dL — ABNORMAL LOW (ref 13.0–17.0)
MCH: 28 pg (ref 26.0–34.0)
MCHC: 32.6 g/dL (ref 30.0–36.0)
MCV: 85.9 fL (ref 78.0–100.0)
Platelets: 51 10*3/uL — ABNORMAL LOW (ref 150–400)
RBC: 3.82 MIL/uL — ABNORMAL LOW (ref 4.22–5.81)
RDW: 13.4 % (ref 11.5–15.5)
WBC: 3.2 10*3/uL — ABNORMAL LOW (ref 4.0–10.5)

## 2016-07-29 LAB — PROTIME-INR
INR: 1.02
Prothrombin Time: 13.4 seconds (ref 11.4–15.2)

## 2016-07-29 LAB — ABO/RH: ABO/RH(D): A POS

## 2016-07-29 LAB — SURGICAL PCR SCREEN
MRSA, PCR: NEGATIVE
Staphylococcus aureus: NEGATIVE

## 2016-07-29 LAB — APTT: aPTT: 36 seconds (ref 24–36)

## 2016-07-29 NOTE — Progress Notes (Signed)
PCP - Tawnya Crook Cardiologist - [facility] - requesting cardiac clearance  Chest x-ray - not needed EKG - requesting Stress Test - requesting ECHO - requesting Cardiac Cath - 2010  Need cardiac clearance note from cardiologist    Patient denies shortness of breath, fever, cough and chest pain at PAT appointment

## 2016-07-29 NOTE — Progress Notes (Signed)
Cardiac clearance note is in Care Everywhere (date 07/23/16)

## 2016-07-29 NOTE — Pre-Procedure Instructions (Signed)
[name]  07/29/2016      [id number] [address], [place] [address] Phone: [phone] Fax: [phone]    Your procedure is scheduled on August 14  Report to [place] at [place].  Call this number if you have problems the morning of surgery:  [phone]   Remember:  Do not eat food or drink liquids after midnight.  Take these medicines the morning of surgery with A SIP OF WATER rabeprazoe (aciphex)l  7 days prior to surgery STOP taking any Aspirin, Aleve, Naproxen, Ibuprofen, Motrin, Advil, Goody's, BC's, all herbal medications, fish oil, and all vitamins    Do not wear jewelry.  Do not wear lotions, powders, or colonge.  You may  NOT wear deoderant.  Men may shave face and neck.  Do not bring valuables to the hospital.  [facility] is not responsible for any belongings or valuables.  Contacts, dentures or bridgework may not be worn into surgery.  Leave your suitcase in the car.  After surgery it may be brought to your room.  For patients admitted to the hospital, discharge time will be determined by your treatment team.  Patients discharged the day of surgery will not be allowed to drive home.    Special instructions:   [facility]- Preparing For Surgery  Before surgery, you can play an important role. Because skin is not sterile, your skin needs to be as free of germs as possible. You can reduce the number of germs on your skin by washing with CHG (chlorahexidine gluconate) Soap before surgery.  CHG is an antiseptic cleaner which kills germs and bonds with the skin to continue killing germs even after washing.  Please do not use if you have an allergy to CHG or antibacterial soaps. If your skin becomes reddened/irritated stop using the CHG.  Do not shave (including legs and underarms) for at least 48 hours prior to first CHG shower. It is OK to shave your face.  Please follow  these instructions carefully.   1. Shower the NIGHT BEFORE SURGERY and the MORNING OF SURGERY with CHG.   2. If you chose to wash your hair, wash your hair first as usual with your normal shampoo.  3. After you shampoo, rinse your hair and body thoroughly to remove the shampoo.  4. Use CHG as you would any other liquid soap. You can apply CHG directly to the skin and wash gently with a scrungie or a clean washcloth.   5. Apply the CHG Soap to your body ONLY FROM THE NECK DOWN.  Do not use on open wounds or open sores. Avoid contact with your eyes, ears, mouth and genitals (private parts). Wash genitals (private parts) with your normal soap.  6. Wash thoroughly, paying special attention to the area where your surgery will be performed.  7. Thoroughly rinse your body with warm water from the neck down.  8. DO NOT shower/wash with your normal soap after using and rinsing off the CHG Soap.  9. Pat yourself dry with a CLEAN TOWEL.   10. Wear CLEAN PAJAMAS   11. Place CLEAN SHEETS on your bed the night of your first shower and DO NOT SLEEP WITH PETS.    Day of Surgery: Do not apply any deodorants/lotions. Please wear clean clothes to the hospital/surgery center.      Please read over the following fact sheets that you were given.  Pain Booklet, Coughing and Deep Breathing, Blood Transfusion Information, MRSA Information and Surgical Site Infection Prevention

## 2016-07-30 ENCOUNTER — Encounter (HOSPITAL_COMMUNITY): Payer: Self-pay | Admitting: Vascular Surgery

## 2016-07-30 ENCOUNTER — Ambulatory Visit: Admit: 2016-07-30 | Payer: Medicare Other | Admitting: Vascular Surgery

## 2016-07-30 ENCOUNTER — Other Ambulatory Visit: Payer: Self-pay | Admitting: Internal Medicine

## 2016-07-30 ENCOUNTER — Telehealth: Payer: Self-pay | Admitting: *Deleted

## 2016-07-30 ENCOUNTER — Encounter (HOSPITAL_COMMUNITY): Payer: Self-pay

## 2016-07-30 DIAGNOSIS — D696 Thrombocytopenia, unspecified: Secondary | ICD-10-CM

## 2016-07-30 SURGERY — ENDOVASCULAR REPAIR/STENT GRAFT
Anesthesia: General

## 2016-07-30 MED ORDER — PREDNISONE 20 MG PO TABS: 60.0000 mg | ORAL_TABLET | Freq: Every day | ORAL | 0 refills | Status: DC

## 2016-07-30 NOTE — Telephone Encounter (Signed)
Per Dr B, Prednisone 60 mg daily times 14 days Lab check CBC on M - W - F next week See Dr Grayland Ormond next week.  Orders sent to scheduling for appts and med escribed. Patient informed

## 2016-07-30 NOTE — Telephone Encounter (Signed)
Received a call from [facility] in Dr Oneida Alar office in [place] asking for a call back regarding his platelet count of [phone]. [phone]. Please call or advise

## 2016-07-30 NOTE — Progress Notes (Signed)
Anesthesia chart review: Patient is an 82 year old male scheduled for endovascular AAA graft repair on 08/04/2016 by Dr. Oneida Alar.  History includes AAA s/p repair '99 [facility]) and now with pseudoaneurysm (distal graft), former smoker, hypertension, psoriasis, PVD, GERD, hyperlipidemia, CAD, carotid artery occlusive disease, skin cancer (BCC), BPH, prostate cancer s/p seed implant '13, cholecystectomy, spinal surgery, pancytopenia (thrombocytopenia treated in the past with steroids pre-operatively). Per Dr. Oneida Alar 07/17/16 office note, if patient has significant thrombocytopenia, he may need a hematology consult preoperatively.  - PCP is Dr. Tawnya Crook. - Cardiologist is Dr. Nehemiah Massed [facility]; Care Everywhere), last visit 07/23/16. He wrote, "The patient is at lowest risk possible for cardiovascular complications with surgical intervention and/or invasive procedure. Currently has no evidence active and/or significant angina and/or congestive heart failure. The patient may discontinue aspirin 3 days prior to procedure and restart at a safe period thereafter." - GI is with [facility], last visit with Faye Ramsay, FNP-BC on 07/14/16.  - Hematologist is Dr. Delight Hoh with [facility], last visit 06/22/16. 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 He would plan to treat with prednisone, and but may require platelet transfusion and bone marrow biopsy in the future.   BP (!) 146/66   Pulse 63   Temp 36.6 C   Resp 18   Ht _0  (1.753 m)   Wt 161 lb 11.2 oz (73.3 kg)   SpO2 100%   BMI 23.88 kg/m    Meds include amlodipine, prednisone, AcipHex, Crestor, Carafate, Diovan HCT.  08/17/15 EKG (from stress test at [facility]): Requested. 08/04/15 tracing showed NSR, LAD, left BBB. 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  08/17/15 Nuclear stress test ([facility]; Care  Everywhere): FINDINGS: Regional wall motion:reveals normal myocardial thickening and wall  Motion. LVEF 62%. The overall quality of the study is good. Artifacts noted: no Left ventricular cavity: normal. Perfusion Analysis:SPECT images demonstrate homogeneous tracer  distribution throughout the myocardium.  826/16 Echo ([facility]; Care Everywhere): INTERPRETATION NORMAL LEFT VENTRICULAR SYSTOLIC FUNCTION WITH MODERATE LVH. EF estimated > 55%. MILD VALVULAR REGURGITATION: Mild MR, TR, PR. MILD VALVULAR STENOSIS: Mild AS. 19.2 mmHg peak grad. 218.3 cm/sec peak vel. 8.7 mmHg mean grad. MILD PHTN. 40.7 mmHg peak RV pressure.  07/09/16 CT Abd/pelvis: IMPRESSION: 1. Focal saccular aneurysmal dilatation of the abdominal aorta just the above the bifurcation, along the distal margin of a tube aortic graft. Differential diagnostic considerations include anastomotic pseudoaneurysm versus an a penetrating ulcer. This represents a significant change from the prior CT abdomen from 07/25/2011. Vascular surgical referral recommended (given the abdominal pain, I would recommend this be considered reasonably urgent but not emergent). I reviewed this appearance with Dr. Aletta Edouard, who concurred. 2. The infrarenal abdominal aorta is also mildly aneurysmal, measuring 3.1 by 3.0 cm although this is along the tube graft repair region. 3. Coronary artery atherosclerosis. 4. Several benign-appearing liver lesions are not appreciably changed from prior exams. 5. Benign appearing renal cysts. Irregular scarring in the left kidney lower pole similar to prior. 6.  Prominent stool throughout the colon favors constipation.  01/23/15 Carotid U/S: 40-59% bilateral ICA stenosis. Known occlusion of the "right internal carotid artery" (but actual report states "vertebral artery" known occluded). Common carotid artery disease present bilaterally. No significant change since 02/23/2013 (which indicates 40-59%  BICA stenosis and "right vertebral artery is occluded").  8/817 labs noted. Na 131. Glucose 106.  WBC 3.2. H/H 10.7/32.8. PLT 51K (previously 46-105K since at least 07/2013) . PT/PTT WNL. T&S done. UA WNL.   Called PLT count results to [facility] RN Colletta Maryland. She will follow-up with Dr. Oneida Alar and Dr. Grayland Ormond for recommendations. Will leave chart for follow-up.  George Hugh [facility] Stay [facility]/Anesthesiology Phone [phone] 07/30/2016 1:49 PM

## 2016-07-30 NOTE — Telephone Encounter (Signed)
Went for Pre op for AAA repair to be done on Monday in [place], Platelet count is 51. Asking if it is safe to  proceed with surg at this level. Please advise

## 2016-07-31 ENCOUNTER — Telehealth: Payer: Self-pay | Admitting: Internal Medicine

## 2016-07-31 NOTE — Telephone Encounter (Signed)
Re: pt platlets 53; need for percut graft repair; discussed with Dr.Fields; non-emergent; ideally want platelets >100; risk of bleeding is small. Clinically patient-suspicious for ITP/responded to steroids in past. Recommend starting prednisone 60 mg a day; check labs Monday Wednesday Friday/follow-up with Dr. Dyann Ruddle need to contact Dr.Fields re: clearance from hematology stand point. Dr.B

## 2016-08-03 NOTE — Progress Notes (Deleted)
[place]  [phone] [phone]) [phone]  ID: [name] OB: 1926/03/06  MR#: [id number]  EXN#:[id number]  Patient Care Team: [name], MD as PCP - General (Family Medicine) Manya Silvas, MD (Gastroenterology)  CHIEF COMPLAINT: Pancytopenia  INTERVAL HISTORY: Patient returns to clinic today as an add-on for further evaluation and discussion of treatment of his thrombocytopenia. He recently had an episode of melena and GI bleed. EGD was attempted, but never completed secondary to patient's thrombocytopenia. He currently feels well and is asymptomatic. He has had no recent fevers or illnesses.  He has a good appetite and denies weight loss.  He has no chest pain, shortness of breath, or cough.  He has no nausea, vomiting, constipation, or diarrhea.  He has no urinary complaints.  Patient offers no further specific complaints today.  REVIEW OF SYSTEMS:   Review of Systems  Constitutional: Negative.  Negative for fever and malaise/fatigue.  Respiratory: Negative.  Negative for shortness of breath.   Cardiovascular: Negative.  Negative for chest pain.  Gastrointestinal: Positive for blood in stool and melena. Negative for abdominal pain, nausea and vomiting.  Genitourinary: Negative.   Musculoskeletal: Negative.   Neurological: Negative.  Negative for weakness.  Endo/Heme/Allergies: Does not bruise/bleed easily.  Psychiatric/Behavioral: Negative.  The patient is not nervous/anxious.     As per HPI. Otherwise, a complete review of systems is negatve.  PAST MEDICAL HISTORY: Past Medical History:  Diagnosis Date  . AAA (abdominal aortic aneurysm) ([place]) 1999  . Adenomatous colon polyp   . Aortic aneurysm ([place]) 1999   repair  . Arthritis   . Basal cell cancer Dec. 24, 2013   Nose  . CAD (coronary artery disease)   . Cancer [facility]) Aug. 2013   prostate Seeding  implant  . Carotid artery occlusion   . Enlarged prostate 2008   Thermotherapy  . Gastroesophageal reflux disease   . Hyperlipidemia   . Hypertension   . Pancytopenia ([place])    sees Dr. Grayland Ormond  . Peripheral vascular disease ([place])   . Psoriasis     PAST SURGICAL HISTORY: Past Surgical History:  Procedure Laterality Date  . ABDOMINAL AORTIC ANEURYSM REPAIR  1999  . CARDIAC CATHETERIZATION  10/2009  . CHOLECYSTECTOMY    . ESOPHAGOGASTRODUODENOSCOPY (EGD) WITH PROPOFOL N/A 06/26/2016   Procedure: ESOPHAGOGASTRODUODENOSCOPY (EGD) WITH PROPOFOL;  Surgeon: Lollie Sails, MD;  Location: [facility] ENDOSCOPY;  Service: Endoscopy;  Laterality: N/A;  . PROSTATE SURGERY    . SPINE SURGERY  2009   ruptured disc  . Visceral angiogram  05/14/2012   Right  groin area  . VISCERAL ANGIOGRAM N/A 05/14/2012   Procedure: VISCERAL ANGIOGRAM;  Surgeon: Elam Dutch, MD;  Location: [facility] CATH LAB;  Service: Cardiovascular;  Laterality: N/A;    FAMILY HISTORY Family History  Problem Relation Age of Onset  . Heart attack Father   . Heart disease Father     After age 47  . COPD Father   . Cancer Sister     Breast and Brain  . Heart disease Brother     before age 1  . Heart attack Brother        ADVANCED DIRECTIVES:    HEALTH MAINTENANCE: Social History  Substance Use Topics  . Smoking status: Former Smoker    Types: Cigarettes    Quit date: 04/29/1997  . Smokeless tobacco: Never Used  . Alcohol use No     Allergies  Allergen Reactions  . Ciprofloxacin  Current Outpatient Prescriptions  Medication Sig Dispense Refill  . felodipine (PLENDIL) 10 MG 24 hr tablet Take 10 mg by mouth daily.    Marland Kitchen glucosamine-chondroitin 500-400 MG tablet Take 1 tablet by mouth daily.     . Multiple Vitamin (MULTIVITAMIN) tablet Take 1 tablet by mouth daily.    . predniSONE (DELTASONE) 20 MG tablet Take 3 tablets (60 mg total) by mouth daily with breakfast. 42 tablet 0  . RABEprazole (ACIPHEX) 20 MG tablet Take 20 mg by mouth daily.    . rosuvastatin (CRESTOR) 20 MG  tablet Take 20 mg by mouth daily.    . sucralfate (CARAFATE) 1 g tablet Take 1 g by mouth 2 (two) times daily.     . valsartan-hydrochlorothiazide (DIOVAN-HCT) 320-25 MG per tablet Take 1 tablet by mouth daily.  5   No current facility-administered medications for this visit.     OBJECTIVE: There were no vitals filed for this visit.   There is no height or weight on file to calculate BMI.    ECOG FS:0 - Asymptomatic  General: Well-developed, well-nourished, no acute distress. Eyes: Pink conjunctiva, anicteric sclera. Lungs: Clear to auscultation bilaterally. Heart: Regular rate and rhythm. No rubs, murmurs, or gallops. Abdomen: Soft, nontender, nondistended. No organomegaly noted, normoactive bowel sounds. Musculoskeletal: No edema, cyanosis, or clubbing. Neuro: Alert, answering all questions appropriately. Cranial nerves grossly intact. Skin: No rashes or petechiae noted. Psych: Normal affect.   LAB RESULTS:  Lab Results  Component Value Date   NA 131 (L) 07/29/2016   K 3.7 07/29/2016   CL 99 (L) 07/29/2016   CO2 22 07/29/2016   GLUCOSE 106 (H) 07/29/2016   BUN 13 07/29/2016   CREATININE 0.84 07/29/2016   CALCIUM 9.4 07/29/2016   PROT 6.1 (L) 07/29/2016   ALBUMIN 4.2 07/29/2016   AST 27 07/29/2016   ALT 21 07/29/2016   ALKPHOS 48 07/29/2016   BILITOT 0.6 07/29/2016   GFRNONAA >60 07/29/2016   GFRAA >60 07/29/2016    Lab Results  Component Value Date   WBC 3.2 (L) 07/29/2016   NEUTROABS 1.5 06/18/2016   HGB 10.7 (L) 07/29/2016   HCT 32.8 (L) 07/29/2016   MCV 85.9 07/29/2016   PLT 51 (L) 07/29/2016     STUDIES: Ct Abdomen Pelvis W Contrast  Result Date: 07/09/2016 CLINICAL DATA:  Prostate and skin cancer. Diffuse abdominal pain for 8 weeks. EXAM: CT ABDOMEN AND PELVIS WITH CONTRAST TECHNIQUE: Multidetector CT imaging of the abdomen and pelvis was performed using the standard protocol following bolus administration of intravenous contrast. CONTRAST:   ISOVUE-300 IOPAMIDOL (ISOVUE-300) INJECTION 61% COMPARISON:  Multiple exams, including abdominal ultrasound from 03/31/2014 and CT scan from 07/25/2011 FINDINGS: Lower chest: Faint reticulonodular peripheral interstitial accentuation in the left lower lobe, no change from 2012. Calcification along the aortic valve. Calcification in the circumflex coronary artery. Hepatobiliary: Oval-shaped 0.7 by 0.4 cm hypodense lesion in segment 2 of the liver, image 10/2, no change from 07/25/2011. Cholecystectomy. 1.4 by 0.7 cm hypodense lesion adjacent to a portal vein branch posteriorly in the right hepatic lobe on image 23/2, previously 1.2 by 0.7 cm, likely a cyst. Subtle 6 mm hypodense lesion in the right hepatic lobe on image 26/2, no change from prior, likely benign. Physiologic dilatation of the extrahepatic biliary tree. Pancreas: Unremarkable Spleen: Unremarkable Adrenals/Urinary Tract: Irregular scarring left kidney lower pole. Left kidney lower pole cysts including a significantly enlarged 3.0 by 3.0 cm cyst of the lower pole anteriorly, Bosniak category 2 with  an internal septation. Stomach/Bowel: Prominent stool throughout the colon favors constipation. Scattered sigmoid colon diverticula. Vascular/Lymphatic: Dense calcification of the splenic artery. Aortoiliac atherosclerotic vascular disease with some generalized the abdominal aortic ectasia. Strictly speaking in the infrarenal position this measures 3.1 by 3.0 cm, qualify as an aneurysm. Just above the bifurcation, there is a mostly thrombosed saccular outpouching/aneurysm from the abdominal aorta on image 47 of series 2. I believe that this is along the lower margin of a presumed tube aortic graft in that was placed in 1999. On this image, contrast medium is observed to extend somewhat beyond the anterior margin of the calcification of the abdominal aorta, suggesting a penetrating ulcer origin of this lesion, further suggested for example on image 64/6.  Although there is some very trace adjacent retroperitoneal stranding, this stranding was probably also present in 2012 and accordingly I do not think that this is necessarily a harbinger of a leak. Nevertheless, this clearly warrants vascular surgical referral. Maximum AP diameter of the abdominal aorta at the level of this aneurysm is 4.0 cm. Reproductive: Seed implants along the prostate gland. Other: No supplemental non-categorized findings. Musculoskeletal: Interbody fusion at L2- 3 with severe loss of intervertebral disc height at L1-2 and L3-4, and moderate loss of disc height at L4-5. Endplate sclerosis at C1-6 and L4-5. Grade 1 retrolisthesis at L1-2 and mild grade 1 anterolisthesis at L4-5. Suspected mild right foraminal stenosis at L3-4 and L4-5, and left foraminal stenosis at the same levels. IMPRESSION: 1. Focal saccular aneurysmal dilatation of the abdominal aorta just the above the bifurcation, along the distal margin of a tube aortic graft. Differential diagnostic considerations include anastomotic pseudoaneurysm versus an a penetrating ulcer. This represents a significant change from the prior CT abdomen from 07/25/2011. Vascular surgical referral recommended (given the abdominal pain, I would recommend this be considered reasonably urgent but not emergent). I reviewed this appearance with Dr. Aletta Edouard, who concurred. 2. The infrarenal abdominal aorta is also mildly aneurysmal, measuring 3.1 by 3.0 cm although this is along the tube graft repair region. 3. Coronary artery atherosclerosis. 4. Several benign-appearing liver lesions are not appreciably changed from prior exams. 5. Benign appearing renal cysts. Irregular scarring in the left kidney lower pole similar to prior. 6.  Prominent stool throughout the colon favors constipation. These results will be called to the ordering clinician or representative by the Radiologist Assistant, and communication documented in the PACS or zVision  Dashboard. Electronically Signed   By: Van Clines M.D.   On: 07/09/2016 10:38    ASSESSMENT: Pancytopenia.  PLAN:    1.  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  Patient's platelet count has ranged between 49 and 104 since at least February 2013.The remainder of his blood work was either negative or within normal limits.  To confirm MDS, a bone marrow biopsy would be required.  2.  Prostate cancer: Patient is status post seed implantation.  His last PSA on March 19, 2015 was reported as 0.03.  3. Thrombocytopenia: Patient was given high-dose steroids for one week to determine if there is any improvement in his platelet count. Patient has blood draw at his GI physician in 1 week and has been instructed to ensure the results are faxed. If prednisone does not work, patient may require a platelet transfusion. Ultimately, he may need a bone marrow biopsy to confirm the diagnosis. 4. GI bleed: EGD is scheduled provided patient's platelet count is greater than 50. 5. Follow-up: Patient  was instructed to follow-up as previously scheduled.  Patient expressed understanding and was in agreement with this plan. He also understands that He can call clinic at any time with any questions, concerns, or complaints.   Lloyd Huger, MD   08/03/2016 11:39 PM

## 2016-08-04 ENCOUNTER — Inpatient Hospital Stay: Payer: Medicare Other | Admitting: Oncology

## 2016-08-04 ENCOUNTER — Inpatient Hospital Stay (HOSPITAL_COMMUNITY): Admission: RE | Admit: 2016-08-04 | Payer: Medicare Other | Source: Ambulatory Visit | Admitting: Vascular Surgery

## 2016-08-04 ENCOUNTER — Inpatient Hospital Stay: Payer: Medicare Other

## 2016-08-04 ENCOUNTER — Encounter (HOSPITAL_COMMUNITY): Admission: RE | Payer: Self-pay | Source: Ambulatory Visit

## 2016-08-04 ENCOUNTER — Telehealth: Payer: Self-pay | Admitting: *Deleted

## 2016-08-04 SURGERY — INSERTION, ENDOVASCULAR STENT GRAFT, AORTA, ABDOMINAL
Anesthesia: General

## 2016-08-04 NOTE — Telephone Encounter (Signed)
Called to states that he thinks having his labs checked on Wed is too soon as he has just started the Prednisone Thursday. He does not want to come and have plt checked until Friday. I advised him that he is down for Wed and Friday and he was adamant that he does not need to come both days and will come on Friday. Appt cancelled per pt request

## 2016-08-04 NOTE — Telephone Encounter (Signed)
Patient advised per VO Dr Grayland Ormond to wait for results Friday before he leaves. He repeated this back to me

## 2016-08-06 ENCOUNTER — Inpatient Hospital Stay: Payer: Medicare Other

## 2016-08-08 ENCOUNTER — Inpatient Hospital Stay: Payer: Medicare Other | Attending: Oncology

## 2016-08-08 DIAGNOSIS — I739 Peripheral vascular disease, unspecified: Secondary | ICD-10-CM | POA: Diagnosis not present

## 2016-08-08 DIAGNOSIS — G4700 Insomnia, unspecified: Secondary | ICD-10-CM | POA: Diagnosis not present

## 2016-08-08 DIAGNOSIS — E785 Hyperlipidemia, unspecified: Secondary | ICD-10-CM | POA: Diagnosis not present

## 2016-08-08 DIAGNOSIS — Z79899 Other long term (current) drug therapy: Secondary | ICD-10-CM | POA: Insufficient documentation

## 2016-08-08 DIAGNOSIS — I719 Aortic aneurysm of unspecified site, without rupture: Secondary | ICD-10-CM | POA: Diagnosis not present

## 2016-08-08 DIAGNOSIS — D696 Thrombocytopenia, unspecified: Secondary | ICD-10-CM | POA: Diagnosis not present

## 2016-08-08 DIAGNOSIS — K921 Melena: Secondary | ICD-10-CM | POA: Insufficient documentation

## 2016-08-08 DIAGNOSIS — Z8719 Personal history of other diseases of the digestive system: Secondary | ICD-10-CM | POA: Diagnosis not present

## 2016-08-08 DIAGNOSIS — Z87891 Personal history of nicotine dependence: Secondary | ICD-10-CM | POA: Insufficient documentation

## 2016-08-08 DIAGNOSIS — K219 Gastro-esophageal reflux disease without esophagitis: Secondary | ICD-10-CM | POA: Insufficient documentation

## 2016-08-08 DIAGNOSIS — R5383 Other fatigue: Secondary | ICD-10-CM | POA: Diagnosis not present

## 2016-08-08 DIAGNOSIS — C61 Malignant neoplasm of prostate: Secondary | ICD-10-CM | POA: Insufficient documentation

## 2016-08-08 DIAGNOSIS — I10 Essential (primary) hypertension: Secondary | ICD-10-CM | POA: Diagnosis not present

## 2016-08-08 DIAGNOSIS — Z809 Family history of malignant neoplasm, unspecified: Secondary | ICD-10-CM | POA: Diagnosis not present

## 2016-08-08 DIAGNOSIS — I2510 Atherosclerotic heart disease of native coronary artery without angina pectoris: Secondary | ICD-10-CM | POA: Diagnosis not present

## 2016-08-08 DIAGNOSIS — R531 Weakness: Secondary | ICD-10-CM | POA: Diagnosis not present

## 2016-08-08 DIAGNOSIS — Z86010 Personal history of colonic polyps: Secondary | ICD-10-CM | POA: Diagnosis not present

## 2016-08-08 DIAGNOSIS — Z85828 Personal history of other malignant neoplasm of skin: Secondary | ICD-10-CM | POA: Diagnosis not present

## 2016-08-08 LAB — CBC WITH DIFFERENTIAL/PLATELET
Basophils Absolute: 0 10*3/uL (ref 0–0.1)
Basophils Relative: 0 %
Eosinophils Absolute: 0 10*3/uL (ref 0–0.7)
Eosinophils Relative: 0 %
HCT: 34.5 % — ABNORMAL LOW (ref 40.0–52.0)
Hemoglobin: 11.6 g/dL — ABNORMAL LOW (ref 13.0–18.0)
Lymphocytes Relative: 21 %
Lymphs Abs: 1.8 10*3/uL (ref 1.0–3.6)
MCH: 28.2 pg (ref 26.0–34.0)
MCHC: 33.8 g/dL (ref 32.0–36.0)
MCV: 83.3 fL (ref 80.0–100.0)
Monocytes Absolute: 1 10*3/uL (ref 0.2–1.0)
Monocytes Relative: 11 %
Neutro Abs: 6 10*3/uL (ref 1.4–6.5)
Neutrophils Relative %: 68 %
Platelets: 96 10*3/uL — ABNORMAL LOW (ref 150–440)
RBC: 4.14 MIL/uL — ABNORMAL LOW (ref 4.40–5.90)
RDW: 14.1 % (ref 11.5–14.5)
WBC: 8.8 10*3/uL (ref 3.8–10.6)

## 2016-08-17 DIAGNOSIS — D696 Thrombocytopenia, unspecified: Secondary | ICD-10-CM | POA: Insufficient documentation

## 2016-08-17 NOTE — Progress Notes (Signed)
[place]  Telephone:(336) [phone] Fax:(336) [phone]  ID: [name] OB: 1926-06-20  MR#: [id number]  IOX#:[id number]  Patient Care Team: [name], MD as PCP - General (Family Medicine) Manya Silvas, MD (Gastroenterology)  CHIEF COMPLAINT: Thrombocytopenia  INTERVAL HISTORY: Patient returns to clinic today as an add-on for further evaluation and discussion of treatment of his thrombocytopenia. He had an improvement of his platelets on prednisone, but discontinued treatment 5 days ago and now his platelets have decreased. He also feels significantly weak and fatigued since discontinuing treatment. Patient requires a grafting of an aortic aneurysm in the near future. He has had no recent fevers or illnesses.  He has a good appetite and denies weight loss.  He has no chest pain, shortness of breath, or cough.  He has no nausea, vomiting, constipation, or diarrhea.  He has no urinary complaints.  Patient offers no further specific complaints today.  REVIEW OF SYSTEMS:   Review of Systems  Constitutional: Positive for malaise/fatigue. Negative for fever and weight loss.  Respiratory: Negative.  Negative for cough and shortness of breath.   Cardiovascular: Negative.  Negative for chest pain.  Gastrointestinal: Positive for blood in stool and melena. Negative for abdominal pain, nausea and vomiting.  Genitourinary: Negative.   Musculoskeletal: Negative.   Neurological: Positive for weakness.  Endo/Heme/Allergies: Does not bruise/bleed easily.  Psychiatric/Behavioral: The patient has insomnia. The patient is not nervous/anxious.     As per HPI. Otherwise, a complete review of systems is negatve.  PAST MEDICAL HISTORY: Past Medical History:  Diagnosis Date  . AAA (abdominal aortic aneurysm) ([place]) 1999  . Adenomatous colon polyp   . Aorta aneurysm ([place])   . Aortic aneurysm ([place]) 1999   repair  . Arthritis   . Basal cell cancer Dec. 24, 2013   Nose    . CAD (coronary artery disease)   . Cancer [facility]) Aug. 2013   prostate Seeding  implant  . Carotid artery occlusion   . Enlarged prostate 2008   Thermotherapy  . Gastroesophageal reflux disease   . Hyperlipidemia   . Hypertension   . Pancytopenia ([place])    sees Dr. Grayland Ormond  . Peripheral vascular disease ([place])   . Psoriasis     PAST SURGICAL HISTORY: Past Surgical History:  Procedure Laterality Date  . ABDOMINAL AORTIC ANEURYSM REPAIR  1999  . CARDIAC CATHETERIZATION  10/2009  . CHOLECYSTECTOMY    . ESOPHAGOGASTRODUODENOSCOPY (EGD) WITH PROPOFOL N/A 06/26/2016   Procedure: ESOPHAGOGASTRODUODENOSCOPY (EGD) WITH PROPOFOL;  Surgeon: Lollie Sails, MD;  Location: [facility] ENDOSCOPY;  Service: Endoscopy;  Laterality: N/A;  . PROSTATE SURGERY    . SPINE SURGERY  2009   ruptured disc  . Visceral angiogram  05/14/2012   Right  groin area  . VISCERAL ANGIOGRAM N/A 05/14/2012   Procedure: VISCERAL ANGIOGRAM;  Surgeon: Elam Dutch, MD;  Location: [facility] CATH LAB;  Service: Cardiovascular;  Laterality: N/A;    FAMILY HISTORY Family History  Problem Relation Age of Onset  . Heart attack Father   . Heart disease Father     After age 63  . COPD Father   . Cancer Sister     Breast and Brain  . Heart disease Brother     before age 72  . Heart attack Brother        ADVANCED DIRECTIVES:    HEALTH MAINTENANCE: Social History  Substance Use Topics  . Smoking status: Former Smoker    Types: Cigarettes  Quit date: 04/29/1997  . Smokeless tobacco: Never Used  . Alcohol use No     Allergies  Allergen Reactions  . Ciprofloxacin     Current Outpatient Prescriptions  Medication Sig Dispense Refill  . felodipine (PLENDIL) 10 MG 24 hr tablet Take 10 mg by mouth daily.    Marland Kitchen glucosamine-chondroitin 500-400 MG tablet Take 1 tablet by mouth daily.     . Multiple Vitamin (MULTIVITAMIN) tablet Take 1 tablet by mouth daily.    . RABEprazole (ACIPHEX) 20 MG tablet Take 20 mg by mouth  daily.    . rosuvastatin (CRESTOR) 20 MG tablet Take 20 mg by mouth daily.    . sucralfate (CARAFATE) 1 g tablet Take 1 g by mouth 2 (two) times daily.     . valsartan-hydrochlorothiazide (DIOVAN-HCT) 320-25 MG per tablet Take 1 tablet by mouth daily.  5   No current facility-administered medications for this visit.     OBJECTIVE: Vitals:   08/18/16 1142  BP: 114/69  Pulse: 76  Resp: 20  Temp: (!) 95.7 F (35.4 C)     Body mass index is 24.09 kg/m.    ECOG FS:0 - Asymptomatic  General: Well-developed, well-nourished, no acute distress. Eyes: Pink conjunctiva, anicteric sclera. Lungs: Clear to auscultation bilaterally. Heart: Regular rate and rhythm. No rubs, murmurs, or gallops. Abdomen: Soft, nontender, nondistended. No organomegaly noted, normoactive bowel sounds. Musculoskeletal: No edema, cyanosis, or clubbing. Neuro: Alert, answering all questions appropriately. Cranial nerves grossly intact. Skin: No rashes or petechiae noted. Psych: Normal affect.   LAB RESULTS:  Lab Results  Component Value Date   NA 131 (L) 07/29/2016   K 3.7 07/29/2016   CL 99 (L) 07/29/2016   CO2 22 07/29/2016   GLUCOSE 106 (H) 07/29/2016   BUN 13 07/29/2016   CREATININE 0.84 07/29/2016   CALCIUM 9.4 07/29/2016   PROT 6.1 (L) 07/29/2016   ALBUMIN 4.2 07/29/2016   AST 27 07/29/2016   ALT 21 07/29/2016   ALKPHOS 48 07/29/2016   BILITOT 0.6 07/29/2016   GFRNONAA >60 07/29/2016   GFRAA >60 07/29/2016    Lab Results  Component Value Date   WBC 4.3 18-Sep-2016   NEUTROABS 2.5 Sep 18, 2016   HGB 11.7 (L) 09-18-2016   HCT 33.5 (L) 2016/09/18   MCV 81.9 Sep 18, 2016   PLT 38 (L) 18-Sep-2016     STUDIES: Ct Biopsy  Result Date: 2016/09/18 INDICATION: Thrombocytopenia. EXAM: CT BIOPSY MEDICATIONS: None. ANESTHESIA/SEDATION: Moderate (conscious) sedation was employed during this procedure. A total of Versed 1.5 mg and Fentanyl 50 mcg was administered intravenously. Moderate Sedation Time: 23  minutes. The patient's level of consciousness and vital signs were monitored continuously by radiology nursing throughout the procedure under my direct supervision. COMPLICATIONS: None immediate. PROCEDURE: Informed written consent was obtained from the patient after a thorough discussion of the procedural risks, benefits and alternatives. All questions were addressed. Sterile Barrier Technique was utilized including mask, sterile gloves, sterile drape, hand hygiene and skin antiseptic. A timeout was performed prior to the initiation of the procedure. The patient was placed prone on CT scanner. Right flank region was prepped and draped using sterile technique. Local anesthetic was applied. Under CT guidance, 22 gauge spinal needle was directed toward the posterior margin of right iliac bone. Lidocaine was injected subperiosteally. Needle was removed. Then, also under CT guidance, trocar was directed through the posterior cortex of right iliac bone. Heparinized and non heparinized bone marrow aspirations were obtained and given to the pathology technologist who was  present at that time. Then, bone marrow core biopsy was obtained and also given to the pathology technologist. Needle was removed and appropriate dressing was applied. IMPRESSION: Under CT guidance, successful percutaneous bone marrow aspiration and core biopsy of right iliac bone. Electronically Signed   By: Marijo Conception, M.D.   On: 08/21/2016 12:13    ASSESSMENT: Thrombocytopenia.  PLAN:    1.  Thrombocytopenia: Unclear etiology, but most likely secondary to underlying MDS.  Patient's platelet count has ranged between 49 and 104 since at least February 2013.The remainder of his blood work was either negative or within normal limits.  Since discontinuing prednisone, patient's platelets have significantly declined. Although prednisone did improve his platelet count, it was not durable and patient refuses to be retreated with steroids. He has  agreed to undergo a bone marrow biopsy to confirm the diagnosis to assist in treatment planning. Return to clinic one week after his biopsy to discuss the results.  2.  Prostate cancer: Patient is status post seed implantation.  His last PSA on March 19, 2015 was reported as 0.03.  3. Aortic aneurysm: Patient requires grafting in the near future, but this procedure has been delayed until there is improvement of his platelet count. 4.  History of GI bleed: Continue monitoring by GI.  Approximately 30 minutes was spent in discussion of which greater than 50% was consultation.  Patient expressed understanding and was in agreement with this plan. He also understands that He can call clinic at any time with any questions, concerns, or complaints.   Lloyd Huger, MD   08/22/2016 8:14 AM

## 2016-08-18 ENCOUNTER — Inpatient Hospital Stay (HOSPITAL_BASED_OUTPATIENT_CLINIC_OR_DEPARTMENT_OTHER): Payer: Medicare Other | Admitting: Oncology

## 2016-08-18 ENCOUNTER — Encounter: Payer: Self-pay | Admitting: Oncology

## 2016-08-18 ENCOUNTER — Inpatient Hospital Stay: Payer: Medicare Other

## 2016-08-18 DIAGNOSIS — I719 Aortic aneurysm of unspecified site, without rupture: Secondary | ICD-10-CM

## 2016-08-18 DIAGNOSIS — Z79899 Other long term (current) drug therapy: Secondary | ICD-10-CM

## 2016-08-18 DIAGNOSIS — G4700 Insomnia, unspecified: Secondary | ICD-10-CM

## 2016-08-18 DIAGNOSIS — D696 Thrombocytopenia, unspecified: Secondary | ICD-10-CM

## 2016-08-18 DIAGNOSIS — I739 Peripheral vascular disease, unspecified: Secondary | ICD-10-CM

## 2016-08-18 DIAGNOSIS — Z8719 Personal history of other diseases of the digestive system: Secondary | ICD-10-CM | POA: Diagnosis not present

## 2016-08-18 DIAGNOSIS — E785 Hyperlipidemia, unspecified: Secondary | ICD-10-CM

## 2016-08-18 DIAGNOSIS — I10 Essential (primary) hypertension: Secondary | ICD-10-CM

## 2016-08-18 DIAGNOSIS — I2510 Atherosclerotic heart disease of native coronary artery without angina pectoris: Secondary | ICD-10-CM

## 2016-08-18 DIAGNOSIS — Z86010 Personal history of colonic polyps: Secondary | ICD-10-CM

## 2016-08-18 DIAGNOSIS — Z87891 Personal history of nicotine dependence: Secondary | ICD-10-CM

## 2016-08-18 DIAGNOSIS — K219 Gastro-esophageal reflux disease without esophagitis: Secondary | ICD-10-CM

## 2016-08-18 DIAGNOSIS — C61 Malignant neoplasm of prostate: Secondary | ICD-10-CM

## 2016-08-18 DIAGNOSIS — Z85828 Personal history of other malignant neoplasm of skin: Secondary | ICD-10-CM

## 2016-08-18 DIAGNOSIS — R5383 Other fatigue: Secondary | ICD-10-CM

## 2016-08-18 DIAGNOSIS — Z809 Family history of malignant neoplasm, unspecified: Secondary | ICD-10-CM

## 2016-08-18 DIAGNOSIS — K921 Melena: Secondary | ICD-10-CM

## 2016-08-18 DIAGNOSIS — R531 Weakness: Secondary | ICD-10-CM

## 2016-08-18 LAB — CBC WITH DIFFERENTIAL/PLATELET
Basophils Absolute: 0 10*3/uL (ref 0–0.1)
Basophils Relative: 0 %
Eosinophils Absolute: 0.1 10*3/uL (ref 0–0.7)
Eosinophils Relative: 1 %
HCT: 33.7 % — ABNORMAL LOW (ref 40.0–52.0)
Hemoglobin: 11.6 g/dL — ABNORMAL LOW (ref 13.0–18.0)
Lymphocytes Relative: 10 %
Lymphs Abs: 0.7 10*3/uL — ABNORMAL LOW (ref 1.0–3.6)
MCH: 28.4 pg (ref 26.0–34.0)
MCHC: 34.3 g/dL (ref 32.0–36.0)
MCV: 82.6 fL (ref 80.0–100.0)
Monocytes Absolute: 2 10*3/uL — ABNORMAL HIGH (ref 0.2–1.0)
Monocytes Relative: 27 %
Neutro Abs: 4.6 10*3/uL (ref 1.4–6.5)
Neutrophils Relative %: 62 %
Platelets: 43 10*3/uL — ABNORMAL LOW (ref 150–440)
RBC: 4.08 MIL/uL — ABNORMAL LOW (ref 4.40–5.90)
RDW: 14 % (ref 11.5–14.5)
WBC: 7.4 10*3/uL (ref 3.8–10.6)

## 2016-08-18 NOTE — Progress Notes (Signed)
Pt reports that since he stopped prednisone he has had pain all over.  Feels very week.

## 2016-08-20 ENCOUNTER — Other Ambulatory Visit: Payer: Self-pay | Admitting: Radiology

## 2016-08-20 ENCOUNTER — Other Ambulatory Visit: Payer: Self-pay | Admitting: General Surgery

## 2016-08-21 ENCOUNTER — Encounter: Payer: Self-pay | Admitting: Oncology

## 2016-08-21 ENCOUNTER — Ambulatory Visit
Admission: RE | Admit: 2016-08-21 | Discharge: 2016-08-21 | Disposition: A | Discharge status: Home / Self Care | Payer: Medicare Other | Source: Ambulatory Visit | Attending: Oncology | Admitting: Oncology

## 2016-08-21 DIAGNOSIS — D696 Thrombocytopenia, unspecified: Secondary | ICD-10-CM

## 2016-08-21 DIAGNOSIS — C9310 Chronic myelomonocytic leukemia not having achieved remission: Secondary | ICD-10-CM | POA: Insufficient documentation

## 2016-08-21 HISTORY — DX: Aortic aneurysm of unspecified site, without rupture: I71.9

## 2016-08-21 LAB — DIFFERENTIAL
Basophils Absolute: 0 10*3/uL (ref 0–0.1)
Basophils Relative: 1 %
Eosinophils Absolute: 0 10*3/uL (ref 0–0.7)
Eosinophils Relative: 1 %
Lymphocytes Relative: 11 %
Lymphs Abs: 0.5 10*3/uL — ABNORMAL LOW (ref 1.0–3.6)
Monocytes Absolute: 1.3 10*3/uL — ABNORMAL HIGH (ref 0.2–1.0)
Monocytes Relative: 30 %
Neutro Abs: 2.5 10*3/uL (ref 1.4–6.5)
Neutrophils Relative %: 57 %

## 2016-08-21 LAB — CBC
HCT: 33.5 % — ABNORMAL LOW (ref 40.0–52.0)
Hemoglobin: 11.7 g/dL — ABNORMAL LOW (ref 13.0–18.0)
MCH: 28.5 pg (ref 26.0–34.0)
MCHC: 34.9 g/dL (ref 32.0–36.0)
MCV: 81.9 fL (ref 80.0–100.0)
Platelets: 38 10*3/uL — ABNORMAL LOW (ref 150–440)
RBC: 4.1 MIL/uL — ABNORMAL LOW (ref 4.40–5.90)
RDW: 14 % (ref 11.5–14.5)
WBC: 4.3 10*3/uL (ref 3.8–10.6)

## 2016-08-21 LAB — APTT: aPTT: 30 seconds (ref 24–36)

## 2016-08-21 LAB — PROTIME-INR
INR: 1
Prothrombin Time: 13.2 seconds (ref 11.4–15.2)

## 2016-08-21 MED ORDER — FENTANYL CITRATE (PF) 100 MCG/2ML IJ SOLN
INTRAMUSCULAR | Status: AC | PRN
  Administered 2016-08-21: 50 ug via INTRAVENOUS

## 2016-08-21 MED ORDER — HYDROCODONE-ACETAMINOPHEN 5-325 MG PO TABS
1.0000 | ORAL_TABLET | ORAL | Status: DC | PRN
Start: 2016-08-21 — End: 2016-08-22
  Filled 2016-08-21: qty 2

## 2016-08-21 MED ORDER — SODIUM CHLORIDE 0.9 % IV SOLN
INTRAVENOUS | Status: DC
  Administered 2016-08-21: 10:00:00 via INTRAVENOUS

## 2016-08-21 MED ORDER — MIDAZOLAM HCL 5 MG/5ML IJ SOLN
INTRAMUSCULAR | Status: AC | PRN
  Administered 2016-08-21: 1 mg via INTRAVENOUS
  Administered 2016-08-21: 0.5 mg via INTRAVENOUS

## 2016-08-21 NOTE — Procedures (Signed)
Procedure and risks discussed with patient. Informed consent obtained. Will perform CT-guided bone marrow biopsy. 

## 2016-08-21 NOTE — Procedures (Signed)
Under CT guidance, bone marrow biopsy of right iliac bone was performed. No immediate complications.

## 2016-09-01 DIAGNOSIS — D469 Myelodysplastic syndrome, unspecified: Secondary | ICD-10-CM | POA: Insufficient documentation

## 2016-09-01 NOTE — Progress Notes (Signed)
[place]  Telephone:(336) [phone] Fax:(336) [phone]  ID: Hale Drone OB: 1926/01/30  MR#: [id number]  CHE#:[id number]  Patient Care Team: [name], MD as PCP - General (Family Medicine) Manya Silvas, MD (Gastroenterology)  CHIEF COMPLAINT: MDS, thrombocytopenia  INTERVAL HISTORY: Patient returns to clinic today for further evaluation and discussion of his bone marrow biopsy results. He is accompanied by his son and wife today. He recently was placed back on prednisone by his PCP and completed a seven day taper yesterday. He currently feels well and is asymptomatic. Patient requires a grafting of an aortic aneurysm in the near future. He denies any easy bleeding or bruising. He has had no recent fevers or illnesses.  He has a good appetite and denies weight loss.  He has no chest pain, shortness of breath, or cough.  He has no nausea, vomiting, constipation, or diarrhea.  He has no urinary complaints.  Patient offers no further specific complaints today.  REVIEW OF SYSTEMS:   Review of Systems  Constitutional: Negative for fever, malaise/fatigue and weight loss.  Respiratory: Negative.  Negative for cough and shortness of breath.   Cardiovascular: Negative.  Negative for chest pain and leg swelling.  Gastrointestinal: Negative for abdominal pain, blood in stool, melena, nausea and vomiting.  Genitourinary: Negative.   Musculoskeletal: Negative.   Neurological: Negative.  Negative for weakness.  Endo/Heme/Allergies: Does not bruise/bleed easily.  Psychiatric/Behavioral: Negative.  The patient is not nervous/anxious and does not have insomnia.     As per HPI. Otherwise, a complete review of systems is negative.  PAST MEDICAL HISTORY: Past Medical History:  Diagnosis Date  . AAA (abdominal aortic aneurysm) ([place]) 1999  . Adenomatous colon polyp   . Aorta aneurysm ([place])   . Aortic aneurysm ([place]) 1999   repair  . Arthritis   . Basal cell cancer  Dec. 24, 2013   Nose  . CAD (coronary artery disease)   . Cancer [facility]) Aug. 2013   prostate Seeding  implant  . Carotid artery occlusion   . Enlarged prostate 2008   Thermotherapy  . Gastroesophageal reflux disease   . Hyperlipidemia   . Hypertension   . Pancytopenia ([place])    sees Dr. Grayland Ormond  . Peripheral vascular disease ([place])   . Psoriasis     PAST SURGICAL HISTORY: Past Surgical History:  Procedure Laterality Date  . ABDOMINAL AORTIC ANEURYSM REPAIR  1999  . CARDIAC CATHETERIZATION  10/2009  . CHOLECYSTECTOMY    . ESOPHAGOGASTRODUODENOSCOPY (EGD) WITH PROPOFOL N/A 06/26/2016   Procedure: ESOPHAGOGASTRODUODENOSCOPY (EGD) WITH PROPOFOL;  Surgeon: Lollie Sails, MD;  Location[facility] ENDOSCOPY;  Service: Endoscopy;  Laterality: N/A;  . PROSTATE SURGERY    . SPINE SURGERY  2009   ruptured disc  . Visceral angiogram  05/14/2012   Right  groin area  . VISCERAL ANGIOGRAM N/A 05/14/2012   Procedure: VISCERAL ANGIOGRAM;  Surgeon: Elam Dutch, MD;  Location: [facility] CATH LAB;  Service: Cardiovascular;  Laterality: N/A;    FAMILY HISTORY Family History  Problem Relation Age of Onset  . Heart attack Father   . Heart disease Father     After age 19  . COPD Father   . Cancer Sister     Breast and Brain  . Heart disease Brother     before age 53  . Heart attack Brother        ADVANCED DIRECTIVES:    HEALTH MAINTENANCE: Social History  Substance Use Topics  . Smoking  status: Former Smoker    Types: Cigarettes    Quit date: 04/29/1997  . Smokeless tobacco: Never Used  . Alcohol use No     Allergies  Allergen Reactions  . Ciprofloxacin     Current Outpatient Prescriptions  Medication Sig Dispense Refill  . felodipine (PLENDIL) 10 MG 24 hr tablet Take 10 mg by mouth daily.    Marland Kitchen glucosamine-chondroitin 500-400 MG tablet Take 1 tablet by mouth daily.     . Multiple Vitamin (MULTIVITAMIN) tablet Take 1 tablet by mouth daily.    . RABEprazole (ACIPHEX) 20 MG tablet  Take 20 mg by mouth daily.    . rosuvastatin (CRESTOR) 20 MG tablet Take 20 mg by mouth daily.    . sucralfate (CARAFATE) 1 g tablet Take 1 g by mouth 2 (two) times daily.     . valsartan-hydrochlorothiazide (DIOVAN-HCT) 320-25 MG per tablet Take 1 tablet by mouth daily.  5   No current facility-administered medications for this visit.     OBJECTIVE: Vitals:   09/02/16 1553  BP: 112/71  Pulse: 68  Resp: 18  Temp: 97.4 F (36.3 C)     Body mass index is 23.28 kg/m.    ECOG FS:0 - Asymptomatic  General: Well-developed, well-nourished, no acute distress. Eyes: Pink conjunctiva, anicteric sclera. Lungs: Clear to auscultation bilaterally. Heart: Regular rate and rhythm. No rubs, murmurs, or gallops. Abdomen: Soft, nontender, nondistended. No organomegaly noted, normoactive bowel sounds. Musculoskeletal: No edema, cyanosis, or clubbing. Neuro: Alert, answering all questions appropriately. Cranial nerves grossly intact. Skin: No rashes or petechiae noted. Psych: Normal affect.   LAB RESULTS:  Lab Results  Component Value Date   NA 131 (L) 07/29/2016   K 3.7 07/29/2016   CL 99 (L) 07/29/2016   CO2 22 07/29/2016   GLUCOSE 106 (H) 07/29/2016   BUN 13 07/29/2016   CREATININE 0.84 07/29/2016   CALCIUM 9.4 07/29/2016   PROT 6.1 (L) 07/29/2016   ALBUMIN 4.2 07/29/2016   AST 27 07/29/2016   ALT 21 07/29/2016   ALKPHOS 48 07/29/2016   BILITOT 0.6 07/29/2016   GFRNONAA >60 07/29/2016   GFRAA >60 07/29/2016    Lab Results  Component Value Date   WBC 4.9 09/02/2016   NEUTROABS 2.8 09/02/2016   HGB 12.0 (L) 09/02/2016   HCT 34.8 (L) 09/02/2016   MCV 80.8 09/02/2016   PLT 85 (L) 09/02/2016     STUDIES: Ct Biopsy  Result Date: 09/19/16 INDICATION: Thrombocytopenia. EXAM: CT BIOPSY MEDICATIONS: None. ANESTHESIA/SEDATION: Moderate (conscious) sedation was employed during this procedure. A total of Versed 1.5 mg and Fentanyl 50 mcg was administered intravenously. Moderate  Sedation Time: 23 minutes. The patient's level of consciousness and vital signs were monitored continuously by radiology nursing throughout the procedure under my direct supervision. COMPLICATIONS: None immediate. PROCEDURE: Informed written consent was obtained from the patient after a thorough discussion of the procedural risks, benefits and alternatives. All questions were addressed. Sterile Barrier Technique was utilized including mask, sterile gloves, sterile drape, hand hygiene and skin antiseptic. A timeout was performed prior to the initiation of the procedure. The patient was placed prone on CT scanner. Right flank region was prepped and draped using sterile technique. Local anesthetic was applied. Under CT guidance, 22 gauge spinal needle was directed toward the posterior margin of right iliac bone. Lidocaine was injected subperiosteally. Needle was removed. Then, also under CT guidance, trocar was directed through the posterior cortex of right iliac bone. Heparinized and non heparinized bone marrow aspirations  were obtained and given to the pathology technologist who was present at that time. Then, bone marrow core biopsy was obtained and also given to the pathology technologist. Needle was removed and appropriate dressing was applied. IMPRESSION: Under CT guidance, successful percutaneous bone marrow aspiration and core biopsy of right iliac bone. Electronically Signed   By: Marijo Conception, M.D.   On: 08/21/2016 12:13    ASSESSMENT: MDS, thrombocytopenia.  PLAN:    1. MDS: Confirmed by bone marrow biopsy. 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  Given his absolute increase in monocytes the possibility of an early/evolving CMML cannot be excluded. Cytogenics are normal 46XY. No increase in blast count. Despite the bone marrow results, patient's son believes he thrombocytopenia is secondary to his Aciphex. He is insistent that his  father hold his Aciphex for one month to see if his platelet count improves. Also, the son believes B-12 deficiency is playing a role.  B-12 levels were drawn today and are actually elevated. At the son's insistence, hold Aciphex and return to clinic in one month for repeat laboratory work and further evaluation. 2. 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 Patient's platelet count is improved secondary to recent prednisone taper from his PCP.  Previously patient refused treatment with prednisone. It also he did not have a durable response.  Nplate was discussed, but it is unclear if this would be beneficial. 2.  Prostate cancer: Patient is status post seed implantation.  His last PSA on March 19, 2015 was reported as 0.03.  3. Aortic aneurysm: Patient requires grafting in the near future, but this procedure has been delayed until there is improvement of his platelet count greater than 100. Patient has also indicated that he does not want to undergo this procedure.  He will likely require a transfusion to improve his platelet count to adequate levels. 4.  History of GI bleed: Continue monitoring by GI.  Discontinue Aciphex at the request of his son as above.  Approximately 30 minutes was spent in discussion of which greater than 50% was consultation.  Patient expressed understanding and was in agreement with this plan. He also understands that He can call clinic at any time with any questions, concerns, or complaints.   Lloyd Huger, MD   09/02/2016 9:41 PM

## 2016-09-02 ENCOUNTER — Inpatient Hospital Stay: Payer: Medicare Other

## 2016-09-02 ENCOUNTER — Other Ambulatory Visit: Payer: Self-pay

## 2016-09-02 ENCOUNTER — Inpatient Hospital Stay: Payer: Medicare Other | Attending: Oncology | Admitting: Oncology

## 2016-09-02 VITALS — BP 112/71 | HR 68 | Temp 97.4°F | Resp 18 | Wt 157.6 lb

## 2016-09-02 DIAGNOSIS — D696 Thrombocytopenia, unspecified: Secondary | ICD-10-CM | POA: Insufficient documentation

## 2016-09-02 DIAGNOSIS — D469 Myelodysplastic syndrome, unspecified: Secondary | ICD-10-CM | POA: Insufficient documentation

## 2016-09-02 DIAGNOSIS — I10 Essential (primary) hypertension: Secondary | ICD-10-CM | POA: Diagnosis not present

## 2016-09-02 DIAGNOSIS — Z79899 Other long term (current) drug therapy: Secondary | ICD-10-CM | POA: Diagnosis not present

## 2016-09-02 DIAGNOSIS — Z87891 Personal history of nicotine dependence: Secondary | ICD-10-CM | POA: Diagnosis not present

## 2016-09-02 DIAGNOSIS — M129 Arthropathy, unspecified: Secondary | ICD-10-CM | POA: Diagnosis not present

## 2016-09-02 DIAGNOSIS — I714 Abdominal aortic aneurysm, without rupture: Secondary | ICD-10-CM | POA: Diagnosis not present

## 2016-09-02 DIAGNOSIS — Z8719 Personal history of other diseases of the digestive system: Secondary | ICD-10-CM | POA: Diagnosis not present

## 2016-09-02 DIAGNOSIS — K219 Gastro-esophageal reflux disease without esophagitis: Secondary | ICD-10-CM | POA: Insufficient documentation

## 2016-09-02 DIAGNOSIS — I2510 Atherosclerotic heart disease of native coronary artery without angina pectoris: Secondary | ICD-10-CM | POA: Diagnosis not present

## 2016-09-02 DIAGNOSIS — I6529 Occlusion and stenosis of unspecified carotid artery: Secondary | ICD-10-CM | POA: Insufficient documentation

## 2016-09-02 DIAGNOSIS — Z803 Family history of malignant neoplasm of breast: Secondary | ICD-10-CM | POA: Diagnosis not present

## 2016-09-02 DIAGNOSIS — E785 Hyperlipidemia, unspecified: Secondary | ICD-10-CM | POA: Diagnosis not present

## 2016-09-02 DIAGNOSIS — Z85828 Personal history of other malignant neoplasm of skin: Secondary | ICD-10-CM | POA: Insufficient documentation

## 2016-09-02 DIAGNOSIS — Z86010 Personal history of colonic polyps: Secondary | ICD-10-CM | POA: Insufficient documentation

## 2016-09-02 DIAGNOSIS — Z8546 Personal history of malignant neoplasm of prostate: Secondary | ICD-10-CM | POA: Insufficient documentation

## 2016-09-02 DIAGNOSIS — I739 Peripheral vascular disease, unspecified: Secondary | ICD-10-CM | POA: Diagnosis not present

## 2016-09-02 DIAGNOSIS — Z808 Family history of malignant neoplasm of other organs or systems: Secondary | ICD-10-CM | POA: Insufficient documentation

## 2016-09-02 LAB — CBC WITH DIFFERENTIAL/PLATELET
Basophils Absolute: 0 10*3/uL (ref 0–0.1)
Basophils Relative: 0 %
Eosinophils Absolute: 0.3 10*3/uL (ref 0–0.7)
Eosinophils Relative: 7 %
HCT: 34.8 % — ABNORMAL LOW (ref 40.0–52.0)
Hemoglobin: 12 g/dL — ABNORMAL LOW (ref 13.0–18.0)
Lymphocytes Relative: 16 %
Lymphs Abs: 0.8 10*3/uL — ABNORMAL LOW (ref 1.0–3.6)
MCH: 27.8 pg (ref 26.0–34.0)
MCHC: 34.4 g/dL (ref 32.0–36.0)
MCV: 80.8 fL (ref 80.0–100.0)
Monocytes Absolute: 1 10*3/uL (ref 0.2–1.0)
Monocytes Relative: 20 %
Neutro Abs: 2.8 10*3/uL (ref 1.4–6.5)
Neutrophils Relative %: 57 %
Platelets: 85 10*3/uL — ABNORMAL LOW (ref 150–440)
RBC: 4.31 MIL/uL — ABNORMAL LOW (ref 4.40–5.90)
RDW: 15.3 % — ABNORMAL HIGH (ref 11.5–14.5)
WBC: 4.9 10*3/uL (ref 3.8–10.6)

## 2016-09-02 LAB — VITAMIN B12: Vitamin B-12: 1409 pg/mL — ABNORMAL HIGH (ref 180–914)

## 2016-09-02 NOTE — Progress Notes (Signed)
Patient just finished a Z-pak and 10 days of prednisone for gastritis. States he still has a cough. Here today for results.

## 2016-09-03 ENCOUNTER — Ambulatory Visit (HOSPITAL_COMMUNITY): Payer: Medicare Other

## 2016-09-03 ENCOUNTER — Ambulatory Visit: Payer: Medicare Other | Admitting: Family

## 2016-09-11 ENCOUNTER — Telehealth: Payer: Self-pay

## 2016-09-11 NOTE — Telephone Encounter (Signed)
-----   Message from Denman George, RN sent at 09/08/2016  4:21 PM EDT ----- Regarding: needs appt. with Dr. Volney American: [phone] This pt. is eager to get an appt. with Dr. Oneida Alar to discuss scheduling an EVAR.  Please look at the earliest date Dr. Oneida Alar has available, to schedule his office appt.

## 2016-09-11 NOTE — Telephone Encounter (Signed)
Spoke to pt, added to 10/19 but put on wait list in case we get an opening.

## 2016-09-19 ENCOUNTER — Ambulatory Visit (HOSPITAL_COMMUNITY): Payer: Medicare Other

## 2016-09-19 ENCOUNTER — Ambulatory Visit: Payer: Medicare Other | Admitting: Family

## 2016-09-19 ENCOUNTER — Encounter (HOSPITAL_COMMUNITY): Payer: Medicare Other

## 2016-10-01 NOTE — Progress Notes (Signed)
[place]  Telephone:(336) [phone] Fax:(336) [phone]  ID: Hale Drone OB: 1926/10/22  MR#: [id number]  OVF#:[id number]  Patient Care Team: [name], MD as PCP - General (Family Medicine) Lollie Sails, MD as Consulting Physician (Gastroenterology)  CHIEF COMPLAINT: MDS, thrombocytopenia  INTERVAL HISTORY: Patient returns to clinic today for repeat laboratory work and further evaluation. He currently feels well and is asymptomatic. He denies any easy bleeding or bruising. He has had no recent fevers or illnesses.  He has a good appetite and denies weight loss.  He has no chest pain, shortness of breath, or cough.  He has no nausea, vomiting, constipation, or diarrhea.  He has no urinary complaints.  Patient offers no specific complaints today.  REVIEW OF SYSTEMS:   Review of Systems  Constitutional: Negative for fever, malaise/fatigue and weight loss.  Respiratory: Negative.  Negative for cough and shortness of breath.   Cardiovascular: Negative.  Negative for chest pain and leg swelling.  Gastrointestinal: Negative for abdominal pain, blood in stool, melena, nausea and vomiting.  Genitourinary: Negative.   Musculoskeletal: Negative.   Neurological: Negative.  Negative for weakness.  Endo/Heme/Allergies: Does not bruise/bleed easily.  Psychiatric/Behavioral: Negative.  The patient is not nervous/anxious and does not have insomnia.     As per HPI. Otherwise, a complete review of systems is negative.  PAST MEDICAL HISTORY: Past Medical History:  Diagnosis Date  . AAA (abdominal aortic aneurysm) ([place]) 1999  . Adenomatous colon polyp   . Aorta aneurysm ([place])   . Aortic aneurysm ([place]) 1999   repair  . Arthritis   . Basal cell cancer Dec. 24, 2013   Nose  . CAD (coronary artery disease)   . Cancer [facility] Aug. 2013   prostate Seeding  implant  . Carotid artery occlusion   . Enlarged prostate 2008   Thermotherapy  . Gastroesophageal reflux  disease   . Hyperlipidemia   . Hypertension   . Pancytopenia ([place])    sees Dr. Grayland Ormond  . Peripheral vascular disease ([place])   . Psoriasis     PAST SURGICAL HISTORY: Past Surgical History:  Procedure Laterality Date  . ABDOMINAL AORTIC ANEURYSM REPAIR  1999  . CARDIAC CATHETERIZATION  10/2009  . CHOLECYSTECTOMY    . ESOPHAGOGASTRODUODENOSCOPY (EGD) WITH PROPOFOL N/A 06/26/2016   Procedure: ESOPHAGOGASTRODUODENOSCOPY (EGD) WITH PROPOFOL;  Surgeon: Lollie Sails, MD;  Location[facility] ENDOSCOPY;  Service: Endoscopy;  Laterality: N/A;  . PROSTATE SURGERY    . SPINE SURGERY  2009   ruptured disc  . Visceral angiogram  05/14/2012   Right  groin area  . VISCERAL ANGIOGRAM N/A 05/14/2012   Procedure: VISCERAL ANGIOGRAM;  Surgeon: Elam Dutch, MD;  Location[facility] CATH LAB;  Service: Cardiovascular;  Laterality: N/A;    FAMILY HISTORY Family History  Problem Relation Age of Onset  . Heart attack Father   . Heart disease Father     After age 55  . COPD Father   . Cancer Sister     Breast and Brain  . Heart disease Brother     before age 61  . Heart attack Brother        ADVANCED DIRECTIVES:    HEALTH MAINTENANCE: Social History  Substance Use Topics  . Smoking status: Former Smoker    Types: Cigarettes    Quit date: 04/29/1997  . Smokeless tobacco: Never Used  . Alcohol use No     Allergies  Allergen Reactions  . Ciprofloxacin  Current Outpatient Prescriptions  Medication Sig Dispense Refill  . felodipine (PLENDIL) 10 MG 24 hr tablet Take 10 mg by mouth daily.    Marland Kitchen glucosamine-chondroitin 500-400 MG tablet Take 1 tablet by mouth daily.     . Multiple Vitamin (MULTIVITAMIN) tablet Take 1 tablet by mouth daily.    . rosuvastatin (CRESTOR) 20 MG tablet Take 20 mg by mouth daily.    . sucralfate (CARAFATE) 1 g tablet Take 1 g by mouth 2 (two) times daily.     . valsartan-hydrochlorothiazide (DIOVAN-HCT) 320-25 MG per tablet Take 1 tablet by mouth daily.  5    No current facility-administered medications for this visit.     OBJECTIVE: Vitals:   10/02/16 1508  BP: 132/75  Pulse: 66  Resp: 18  Temp: 98 F (36.7 C)     Body mass index is 23.67 kg/m.    ECOG FS:0 - Asymptomatic  General: Well-developed, well-nourished, no acute distress. Eyes: Pink conjunctiva, anicteric sclera. Lungs: Clear to auscultation bilaterally. Heart: Regular rate and rhythm. No rubs, murmurs, or gallops. Abdomen: Soft, nontender, nondistended. No organomegaly noted, normoactive bowel sounds. Musculoskeletal: No edema, cyanosis, or clubbing. Neuro: Alert, answering all questions appropriately. Cranial nerves grossly intact. Skin: No rashes or petechiae noted. Psych: Normal affect.   LAB RESULTS:  Lab Results  Component Value Date   NA 131 (L) 07/29/2016   K 3.7 07/29/2016   CL 99 (L) 07/29/2016   CO2 22 07/29/2016   GLUCOSE 106 (H) 07/29/2016   BUN 13 07/29/2016   CREATININE 0.84 07/29/2016   CALCIUM 9.4 07/29/2016   PROT 6.1 (L) 07/29/2016   ALBUMIN 4.2 07/29/2016   AST 27 07/29/2016   ALT 21 07/29/2016   ALKPHOS 48 07/29/2016   BILITOT 0.6 07/29/2016   GFRNONAA >60 07/29/2016   GFRAA >60 07/29/2016    Lab Results  Component Value Date   WBC 3.2 (L) 10/02/2016   NEUTROABS 1.6 10/02/2016   HGB 11.6 (L) 10/02/2016   HCT 33.8 (L) 10/02/2016   MCV 81.0 10/02/2016   PLT 59 (L) 10/02/2016     STUDIES: No results found.  ASSESSMENT: MDS, thrombocytopenia.  PLAN:    1. MDS: Confirmed by bone marrow biopsy. 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  Given his absolute increase in monocytes the possibility of an early/evolving CMML cannot be excluded. Cytogenics are normal 46XY. No increase in blast count. No intervention is needed at this time. Return to clinic in 3 months with repeat laboratory work and further evaluation.  2. 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 Patient's  platelet count is improved secondary to recent prednisone taper from his PCP.  He had significant side effects with steroids, therefore will avoid further treatment if possible. 2.  Prostate cancer: Patient is status post seed implantation.  His last PSA on March 19, 2015 was reported as 0.03.  3. Aortic aneurysm: Patient requires grafting in the near future, but this procedure has been delayed until there is improvement of his platelet count greater than 100. Patient has also indicated that he does not want to undergo this procedure.  He will likely require a transfusion to improve his platelet count to adequate levels if he elects her surgery. 4.  History of GI bleed: Continue monitoring by GI.  Patient has discontinued Aciphex.   Patient expressed understanding and was in agreement with this plan. He also understands that He can call clinic at any time with any questions, concerns, or  complaints.   Lloyd Huger, MD   10/06/2016 6:36 PM

## 2016-10-02 ENCOUNTER — Inpatient Hospital Stay: Payer: Medicare Other | Attending: Oncology

## 2016-10-02 ENCOUNTER — Inpatient Hospital Stay (HOSPITAL_BASED_OUTPATIENT_CLINIC_OR_DEPARTMENT_OTHER): Payer: Medicare Other | Admitting: Oncology

## 2016-10-02 VITALS — BP 132/75 | HR 66 | Temp 98.0°F | Resp 18 | Wt 160.3 lb

## 2016-10-02 DIAGNOSIS — I739 Peripheral vascular disease, unspecified: Secondary | ICD-10-CM | POA: Diagnosis not present

## 2016-10-02 DIAGNOSIS — Z86010 Personal history of colonic polyps: Secondary | ICD-10-CM | POA: Diagnosis not present

## 2016-10-02 DIAGNOSIS — K219 Gastro-esophageal reflux disease without esophagitis: Secondary | ICD-10-CM

## 2016-10-02 DIAGNOSIS — D61818 Other pancytopenia: Secondary | ICD-10-CM | POA: Diagnosis not present

## 2016-10-02 DIAGNOSIS — I714 Abdominal aortic aneurysm, without rupture: Secondary | ICD-10-CM

## 2016-10-02 DIAGNOSIS — E785 Hyperlipidemia, unspecified: Secondary | ICD-10-CM | POA: Insufficient documentation

## 2016-10-02 DIAGNOSIS — D696 Thrombocytopenia, unspecified: Secondary | ICD-10-CM | POA: Insufficient documentation

## 2016-10-02 DIAGNOSIS — I2510 Atherosclerotic heart disease of native coronary artery without angina pectoris: Secondary | ICD-10-CM | POA: Diagnosis not present

## 2016-10-02 DIAGNOSIS — I10 Essential (primary) hypertension: Secondary | ICD-10-CM

## 2016-10-02 DIAGNOSIS — N400 Enlarged prostate without lower urinary tract symptoms: Secondary | ICD-10-CM | POA: Diagnosis not present

## 2016-10-02 DIAGNOSIS — L409 Psoriasis, unspecified: Secondary | ICD-10-CM

## 2016-10-02 DIAGNOSIS — M129 Arthropathy, unspecified: Secondary | ICD-10-CM | POA: Diagnosis not present

## 2016-10-02 DIAGNOSIS — D469 Myelodysplastic syndrome, unspecified: Secondary | ICD-10-CM | POA: Diagnosis not present

## 2016-10-02 DIAGNOSIS — Z85828 Personal history of other malignant neoplasm of skin: Secondary | ICD-10-CM

## 2016-10-02 LAB — CBC WITH DIFFERENTIAL/PLATELET
Band Neutrophils: 0 %
Basophils Absolute: 0 10*3/uL (ref 0–0.1)
Basophils Relative: 0 %
Blasts: 0 %
Eosinophils Absolute: 0 10*3/uL (ref 0–0.7)
Eosinophils Relative: 1 %
HCT: 33.8 % — ABNORMAL LOW (ref 40.0–52.0)
Hemoglobin: 11.6 g/dL — ABNORMAL LOW (ref 13.0–18.0)
Lymphocytes Relative: 37 %
Lymphs Abs: 1.2 10*3/uL (ref 1.0–3.6)
MCH: 27.7 pg (ref 26.0–34.0)
MCHC: 34.2 g/dL (ref 32.0–36.0)
MCV: 81 fL (ref 80.0–100.0)
Metamyelocytes Relative: 0 %
Monocytes Absolute: 0.4 10*3/uL (ref 0.2–1.0)
Monocytes Relative: 14 %
Myelocytes: 0 %
Neutro Abs: 1.6 10*3/uL (ref 1.4–6.5)
Neutrophils Relative %: 48 %
Other: 0 %
Platelets: 59 10*3/uL — ABNORMAL LOW (ref 150–440)
Promyelocytes Absolute: 0 %
RBC: 4.18 MIL/uL — ABNORMAL LOW (ref 4.40–5.90)
RDW: 15.8 % — ABNORMAL HIGH (ref 11.5–14.5)
WBC: 3.2 10*3/uL — ABNORMAL LOW (ref 3.8–10.6)
nRBC: 0 /100 WBC

## 2016-10-03 ENCOUNTER — Encounter (HOSPITAL_COMMUNITY): Payer: Medicare Other

## 2016-10-03 ENCOUNTER — Ambulatory Visit: Payer: Medicare Other | Admitting: Family

## 2016-10-09 ENCOUNTER — Encounter: Payer: Medicare Other | Admitting: Vascular Surgery

## 2016-10-13 ENCOUNTER — Encounter: Payer: Self-pay | Admitting: Vascular Surgery

## 2016-10-15 ENCOUNTER — Ambulatory Visit (INDEPENDENT_AMBULATORY_CARE_PROVIDER_SITE_OTHER): Payer: Medicare Other | Admitting: Vascular Surgery

## 2016-10-15 ENCOUNTER — Encounter: Payer: Self-pay | Admitting: Vascular Surgery

## 2016-10-15 VITALS — BP 112/66 | HR 70 | Temp 97.7°F | Resp 20 | Ht 69.0 in | Wt 160.0 lb

## 2016-10-15 DIAGNOSIS — I714 Abdominal aortic aneurysm, without rupture, unspecified: Secondary | ICD-10-CM

## 2016-10-15 NOTE — Progress Notes (Signed)
Patient is an 82 year old male that we have been following in the past for bilateral asymptomatic moderate carotid stenosis. 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 The patient had repair of an abdominal aortic aneurysm in 1999 at [facility]. He had a CT scan recently for evaluation of abdominal pain July 2017. This showed a 15 mm pseudoaneurysm at the distal anastomosis remainder of the graft was intact.  He still is experiencing some abdominal pain but overall this is improved. He did have an upper endoscopy and it was thought that potentially his symptoms were related to either esophagitis or gastritis. He did not have any ulcers. I evaluated the patient in 2013 with a mesenteric angiogram which was essentially negative.  He has had no weight loss. He has previously had a small bowel obstruction. Patient denies any fever or chills or other constitutional symptoms to suggest aortic graft infection. He has chronic back pain but has not noted any difference in this recently.  During the course of the workup for placement of an aortic stent graft he was noted to have thrombocytopenia. 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 He is currently still undergoing evaluation and a second opinion regarding this at [facility].He also has a history of thrombocytopenia but states his platelet count is usually improved with short courses of steroids for procedures.   His hematologist had told him that they would probably have some difficulty keeping his platelet count above 100.    He is overall very active and does frequent home workouts.  He denies any claudication symptoms. He denies any symptoms of TIA amaurosis or stroke.  Other medical problems include recent diagnosis of prostate cancer. He also has multiple joint arthritis, hypertension, reflux, coronary disease, hyperlipidemia.  These are currently controlled.                 Past Medical History  Diagnosis Date  . Enlarged prostate 2008      Thermotherapy  . Arthritis    . Hypertension    . Psoriasis    . Gastroesophageal reflux disease    . Peripheral vascular disease    . CAD (coronary artery disease)    . Hyperlipidemia    . Adenomatous colon polyp    . Aortic aneurysm 1999      repair  . Carotid artery occlusion    . AAA (abdominal aortic aneurysm) 1999  . Cancer        prostate               Past Surgical History  Procedure Date  . Cholecystectomy    . Cardiac catheterization 10/2009  . Spine surgery 2009      ruptured disc  . Abdominal aortic aneurysm repair 199        Social History          History  Substance Use Topics  . Smoking status: Former Smoker      Types: Cigarettes      Quit date: 04/29/1997  . Smokeless tobacco: Never Used  . Alcohol Use: No      Family History            Family History  Problem Relation Age of Onset  . Heart attack Father    . Cancer Sister    . Heart disease Brother        before age 58    . Heart attack Brother  Allergies   No Known Allergies           Current Outpatient Prescriptions on File Prior to Visit  Medication Sig Dispense Refill  . felodipine (PLENDIL) 10 MG 24 hr tablet Take 10 mg by mouth daily.      Marland Kitchen glucosamine-chondroitin 500-400 MG tablet Take 1 tablet by mouth 2 (two) times daily.       Marland Kitchen loteprednol (LOTEMAX) 0.5 % ophthalmic suspension Reported on 06/26/2016      . Multiple Vitamin (MULTIVITAMIN) tablet Take 1 tablet by mouth daily.      . predniSONE (DELTASONE) 20 MG tablet Take 3 tablets (60 mg total) by mouth daily with breakfast. 21 tablet 0  . rosuvastatin (CRESTOR) 20 MG tablet Take 20 mg by mouth daily.      . valsartan-hydrochlorothiazide (DIOVAN-HCT) 320-25 MG per tablet Take 1 tablet by mouth daily.   5    No current facility-administered medications on file prior to visit.       ROS:    General:  No weight loss, Fever,  chills   HEENT: No recent headaches, no nasal bleeding, no visual changes, no sore throat   Neurologic: No dizziness, blackouts, seizures. No recent symptoms of stroke or mini- stroke. No recent episodes of slurred speech, or temporary blindness.   Cardiac: No recent episodes of chest pain/pressure, no shortness of breath at rest.  No shortness of breath with exertion.  Denies history of atrial fibrillation or irregular heartbeat  He had a cardiac stress test 9 months ago which was negative per the patient. He is followed by Dr. Nehemiah Massed.   Vascular: No history of rest pain in feet.  No history of claudication.  No history of non-healing ulcer, No history of DVT    Pulmonary: No home oxygen, no productive cough, no hemoptysis,  No asthma or wheezing   Musculoskeletal:  [x ] Arthritis, [ ]  Low back pain,  [ ]  Joint pain   Hematologic:No history of hypercoagulable state.  No history of easy bleeding.  No history of anemia   Gastrointestinal:  + gastroesophageal reflux, no trouble swallowing   Urinary: [ ]  chronic Kidney disease, [ ]  on HD - [ ]  MWF or [ ]  TTHS, [ ]  Burning with urination, [ ]  Frequent urination, [ ]  Difficulty urinating;    Skin: No rashes   Psychological: No history of anxiety,  No history of depression     Physical Examination    Vitals:   10/15/16 1331  BP: 112/66  Pulse: 70  Resp: 20  Temp: 97.7 F (36.5 C)  TempSrc: Oral  SpO2: 97%  Weight: 160 lb (72.6 kg)  Height: 5\' 9"  (1.753 m)     General:  Alert and oriented, no acute distress Abdomen: Soft, non-tender, non-distended, no mass, transverse abdominal scar, prominent aortic pulsation just left of the umbilicus, audible bruit epigastric Extremity Pulses:  2+ radial, brachial, femoral bilaterally Musculoskeletal: No deformity or edema                Neurologic: Upper and lower extremity motor 5/5 and symmetric   DATA:   I reviewed the patient's CT scan findings as listed per history of present  illness. Of note the patient's celiac and superior mesenteric arteries were widely patent.   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 I discussed with him that there is really no good data on the natural history of this. I believe the risk of fixing his aneurysm even with a stent graft with significant thrombocytopenia may not be trivial. Also of note he is 82 years old. In light of all of this I believe the best option would be to repeat his CT scan in 3 months time. If the anastomotic pseudoaneurysm is growing or changing we would again consider repair. However if it is stable in the next CT scan in light of the patient's age and comorbidities observation may be the best course. I did discuss the patient today that if he develops severe abdominal or back pain he should let the emergency room no that he has a history of a anastomotic aortic pseudoaneurysm   The patient will follow-up with me in 3 months after his CT scan.       [name], MD [facility] Office: [phone] Pager: [phone]

## 2016-10-31 ENCOUNTER — Other Ambulatory Visit: Payer: Self-pay | Admitting: *Deleted

## 2016-10-31 DIAGNOSIS — Z48812 Encounter for surgical aftercare following surgery on the circulatory system: Secondary | ICD-10-CM

## 2016-10-31 DIAGNOSIS — I719 Aortic aneurysm of unspecified site, without rupture: Secondary | ICD-10-CM

## 2016-11-06 ENCOUNTER — Other Ambulatory Visit: Payer: Medicare Other

## 2016-11-06 ENCOUNTER — Ambulatory Visit: Payer: Medicare Other | Admitting: Oncology

## 2016-11-06 ENCOUNTER — Telehealth: Payer: Self-pay | Admitting: Vascular Surgery

## 2016-11-06 ENCOUNTER — Inpatient Hospital Stay: Payer: Medicare Other | Attending: Oncology

## 2016-11-06 DIAGNOSIS — D469 Myelodysplastic syndrome, unspecified: Secondary | ICD-10-CM

## 2016-11-06 LAB — CBC WITH DIFFERENTIAL/PLATELET
Basophils Absolute: 0 10*3/uL (ref 0–0.1)
Basophils Relative: 1 %
Eosinophils Absolute: 0 10*3/uL (ref 0–0.7)
Eosinophils Relative: 0 %
HCT: 35.9 % — ABNORMAL LOW (ref 40.0–52.0)
Hemoglobin: 12.2 g/dL — ABNORMAL LOW (ref 13.0–18.0)
Lymphocytes Relative: 30 %
Lymphs Abs: 1.4 10*3/uL (ref 1.0–3.6)
MCH: 27.8 pg (ref 26.0–34.0)
MCHC: 33.9 g/dL (ref 32.0–36.0)
MCV: 82.2 fL (ref 80.0–100.0)
Monocytes Absolute: 0.7 10*3/uL (ref 0.2–1.0)
Monocytes Relative: 15 %
Neutro Abs: 2.5 10*3/uL (ref 1.4–6.5)
Neutrophils Relative %: 54 %
Platelets: 58 10*3/uL — ABNORMAL LOW (ref 150–440)
RBC: 4.37 MIL/uL — ABNORMAL LOW (ref 4.40–5.90)
RDW: 15.6 % — ABNORMAL HIGH (ref 11.5–14.5)
WBC: 4.6 10*3/uL (ref 3.8–10.6)

## 2016-11-06 NOTE — Telephone Encounter (Signed)
Sched CTA at the [facility] as per pt's request 12/23/16 at 1:00. Sched [facility] visit 12/25/16 at 10:45. Spoke to pt to inform them of appts.

## 2016-11-06 NOTE — Telephone Encounter (Signed)
-----   Message from Elam Dutch, MD sent at 10/20/2016 11:12 AM EDT ----- Regarding: RE: pt cta request That's fine  [name] ----- Message ----- From: Georgiann Mccoy Sent: 10/15/2016   3:45 PM To: Elam Dutch, MD Subject: pt cta request                                 Hello Dr. Oneida Alar,  This pt called yesterday wanting to know if he could move up his 3 m CTA follow up to now instead or if it would be better to wait.  Thank you, Selinda Eon

## 2016-11-25 ENCOUNTER — Ambulatory Visit
Admission: RE | Admit: 2016-11-25 | Discharge: 2016-11-25 | Disposition: A | Discharge status: Home / Self Care | Payer: Medicare Other | Source: Ambulatory Visit | Attending: Vascular Surgery | Admitting: Vascular Surgery

## 2016-11-25 DIAGNOSIS — I719 Aortic aneurysm of unspecified site, without rupture: Secondary | ICD-10-CM | POA: Diagnosis not present

## 2016-11-25 DIAGNOSIS — Z48812 Encounter for surgical aftercare following surgery on the circulatory system: Secondary | ICD-10-CM | POA: Insufficient documentation

## 2016-11-25 DIAGNOSIS — Z9889 Other specified postprocedural states: Secondary | ICD-10-CM | POA: Diagnosis not present

## 2016-11-25 LAB — POCT I-STAT CREATININE: Creatinine, Ser: 1.1 mg/dL (ref 0.61–1.24)

## 2016-11-25 MED ORDER — IOPAMIDOL (ISOVUE-370) INJECTION 76%
100.0000 mL | Freq: Once | INTRAVENOUS | Status: AC | PRN
  Administered 2016-11-25: 100 mL via INTRAVENOUS

## 2016-11-28 ENCOUNTER — Telehealth: Payer: Self-pay | Admitting: Vascular Surgery

## 2016-11-28 NOTE — Telephone Encounter (Signed)
Called patient and scheduled an appt for Wed 12/10/16 at 2:45pm w/ [facility]. Pt is aware. Mailed letter/awt

## 2016-11-28 NOTE — Telephone Encounter (Signed)
-----   Message from Mena Goes, RN sent at 11/28/2016 11:58 AM EST ----- Regarding: FW: results for patient   ----- Message ----- From: Elam Dutch, MD Sent: 11/28/2016  10:22 AM To: Mena Goes, RN Subject: RE: results for patient                        Have him come in 2 weeks.  I have 3 offices that week.  Charles ----- Message ----- From: Mena Goes, RN Sent: 11/27/2016   5:02 PM To: Elam Dutch, MD Subject: results for patient                            Mr. Meinecke called to report that he was having intermittent left side pain x 1 month. He went ahead and had his CTA since he was having the pain. He would like the results, he is willing to come in sooner than his scheduled 3 month follow up to discuss if you wish.  Currently on your schedule for 12-25-16.   OCD [facility]

## 2016-12-04 ENCOUNTER — Encounter: Payer: Self-pay | Admitting: Vascular Surgery

## 2016-12-08 ENCOUNTER — Ambulatory Visit: Payer: Medicare Other | Admitting: Vascular Surgery

## 2016-12-10 ENCOUNTER — Encounter: Payer: Self-pay | Admitting: Vascular Surgery

## 2016-12-10 ENCOUNTER — Ambulatory Visit (INDEPENDENT_AMBULATORY_CARE_PROVIDER_SITE_OTHER): Payer: Medicare Other | Admitting: Vascular Surgery

## 2016-12-10 VITALS — BP 128/74 | HR 70 | Temp 97.1°F | Resp 18 | Ht 69.0 in | Wt 162.9 lb

## 2016-12-10 DIAGNOSIS — I714 Abdominal aortic aneurysm, without rupture, unspecified: Secondary | ICD-10-CM

## 2016-12-10 NOTE — Progress Notes (Signed)
Patient is an 82 year old male that we have been following in the past for bilateral asymptomatic moderate carotid stenosis. 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 The patient had repair of an abdominal aortic aneurysm in 1999 at [facility]. He had a CT scan recently for evaluation of abdominal pain July 2017. This showed a 15 mm pseudoaneurysm at the distal anastomosis remainder of the graft was intact.  He still is experiencing some abdominal pain but overall this is improved. He did have an upper endoscopy and it was thought that potentially his symptoms were related to either esophagitis or gastritis. He did not have any ulcers. I evaluated the patient in 2013 with a mesenteric angiogram which was essentially negative.  He has had no weight loss. He has previously had a small bowel obstruction. Patient denies any fever or chills or other constitutional symptoms to suggest aortic graft infection. He has chronic back pain but has not noted any difference in this recently.   During the course of the workup for placement of an aortic stent graft he was noted to have thrombocytopenia. 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 He is currently still undergoing evaluation and a second opinion regarding this at [facility].He also has a history of thrombocytopenia but states his platelet count is usually improved with short courses of steroids for procedures.   His hematologist had told him that they would probably have some difficulty keeping his platelet count above 100.    He is overall very active and does frequent home workouts.  He denies any claudication symptoms. He denies any symptoms of TIA amaurosis or stroke.  Other medical problems include recent diagnosis of prostate cancer. He also has multiple joint arthritis, hypertension, reflux, coronary disease, hyperlipidemia.  These are currently controlled.                   Past Medical History  Diagnosis Date  . Enlarged prostate 2008      Thermotherapy  . Arthritis    . Hypertension    . Psoriasis    . Gastroesophageal reflux disease    . Peripheral vascular disease    . CAD (coronary artery disease)    . Hyperlipidemia    . Adenomatous colon polyp    . Aortic aneurysm 1999      repair  . Carotid artery occlusion    . AAA (abdominal aortic aneurysm) 1999  . Cancer        prostate               Past Surgical History  Procedure Date  . Cholecystectomy    . Cardiac catheterization 10/2009  . Spine surgery 2009      ruptured disc  . Abdominal aortic aneurysm repair 199        Social History          History  Substance Use Topics  . Smoking status: Former Smoker      Types: Cigarettes      Quit date: 04/29/1997  . Smokeless tobacco: Never Used  . Alcohol Use: No      Family History            Family History  Problem Relation Age of Onset  . Heart attack Father    . Cancer Sister    . Heart disease Brother        before age 60    .  Heart attack Brother        Allergies   No Known Allergies                Current Outpatient Prescriptions on File Prior to Visit  Medication Sig Dispense Refill  . felodipine (PLENDIL) 10 MG 24 hr tablet Take 10 mg by mouth daily.      Marland Kitchen glucosamine-chondroitin 500-400 MG tablet Take 1 tablet by mouth 2 (two) times daily.       Marland Kitchen loteprednol (LOTEMAX) 0.5 % ophthalmic suspension Reported on 06/26/2016      . Multiple Vitamin (MULTIVITAMIN) tablet Take 1 tablet by mouth daily.      . predniSONE (DELTASONE) 20 MG tablet Take 3 tablets (60 mg total) by mouth daily with breakfast. 21 tablet 0  . rosuvastatin (CRESTOR) 20 MG tablet Take 20 mg by mouth daily.      . valsartan-hydrochlorothiazide (DIOVAN-HCT) 320-25 MG per tablet Take 1 tablet by mouth daily.   5    No current facility-administered medications on file prior to visit.       ROS:    General:  No weight loss,  Fever, chills   HEENT: No recent headaches, no nasal bleeding, no visual changes, no sore throat   Neurologic: No dizziness, blackouts, seizures. No recent symptoms of stroke or mini- stroke. No recent episodes of slurred speech, or temporary blindness.   Cardiac: No recent episodes of chest pain/pressure, no shortness of breath at rest.  No shortness of breath with exertion.  Denies history of atrial fibrillation or irregular heartbeat  He had a cardiac stress test 9 months ago which was negative per the patient. He is followed by Dr. Nehemiah Massed.   Vascular: No history of rest pain in feet.  No history of claudication.  No history of non-healing ulcer, No history of DVT    Pulmonary: No home oxygen, no productive cough, no hemoptysis,  No asthma or wheezing   Musculoskeletal:  [x ] Arthritis, [ ]  Low back pain,  [ ]  Joint pain   Hematologic:No history of hypercoagulable state.  No history of easy bleeding.  No history of anemia   Gastrointestinal:  + gastroesophageal reflux, no trouble swallowing   Urinary: [ ]  chronic Kidney disease, [ ]  on HD - [ ]  MWF or [ ]  TTHS, [ ]  Burning with urination, [ ]  Frequent urination, [ ]  Difficulty urinating;    Skin: No rashes   Psychological: No history of anxiety,  No history of depression     Physical Examination      General:  Alert and oriented, no acute distress Abdomen: Soft, non-tender, non-distended, no mass, transverse abdominal scar, prominent aortic pulsation just left of the umbilicus, audible bruit epigastric Extremity Pulses:  2+ radial, brachial, femoral bilaterally Musculoskeletal: No deformity or edema                Neurologic: Upper and lower extremity motor 5/5 and symmetric   DATA:   I reviewed the patient's CT scan findings distal anastomotic pseudoaneurysm is slightly larger and is currently from 15 mm in diameter to 22 mm in diameter. Overall aortic diameter has increased from 4 cm to 4.3 cm. Of note the patient's celiac  and superior mesenteric arteries were widely patent.   ASSESSMENT:    1 distal anastomotic pseudoaneurysm from previous aortic aneurysm repair 15 mm now 22 mm diameter with 7 mm growth over 6 months #2 stable bilateral moderate carotid stenosis     PLAN: I  had a lengthy discussion with the patient today regarding risks versus benefits of repairing this anastomotic pseudoaneurysm. I discussed with him that there is really no good data on the natural history of this. I believe the risk of fixing his aneurysm even with a stent graft with significant thrombocytopenia may not be trivial. About a 5-10% chance of bleeding. The patient's son was present for the conversation. I also discussed with the patient that we would transfuse him with platelets perioperatively. He has not reported any prolonged bleeding time with any cuts or any increased bruisability. He also states that he recently was seen at [facility] and has a second opinion they thought the platelets that he did have were functional.   I did discuss with the patient today that if he develops severe abdominal or back pain he should let the emergency room no that he has a history of a anastomotic aortic pseudoaneurysm    The patient wishes to think about whether or not to have aneurysm repair for a few days. He will call us back if he wishes to proceed with repair.       [name], MD [facility] Office: [phone] Pager: [phone]

## 2016-12-23 ENCOUNTER — Other Ambulatory Visit: Payer: Medicare Other

## 2016-12-25 ENCOUNTER — Ambulatory Visit: Payer: Medicare Other | Admitting: Vascular Surgery

## 2017-01-05 ENCOUNTER — Other Ambulatory Visit: Payer: Medicare Other

## 2017-01-05 ENCOUNTER — Ambulatory Visit: Payer: Medicare Other | Admitting: Oncology

## 2017-01-12 ENCOUNTER — Other Ambulatory Visit: Payer: Medicare Other

## 2017-01-22 ENCOUNTER — Ambulatory Visit: Payer: Medicare Other | Admitting: Vascular Surgery

## 2017-01-23 ENCOUNTER — Other Ambulatory Visit: Payer: Self-pay | Admitting: *Deleted

## 2017-01-23 DIAGNOSIS — D696 Thrombocytopenia, unspecified: Secondary | ICD-10-CM

## 2017-01-25 NOTE — Progress Notes (Signed)
[place]  Telephone:(336) [phone] Fax:(336) [phone]  ID: [name] OB: Jun 13, 1926  MR#: [id number]  GYB#:[id number]  Patient Care Team: [name], MD as PCP - General (Family Medicine) Lollie Sails, MD as Consulting Physician (Gastroenterology)  CHIEF COMPLAINT: MDS, thrombocytopenia  INTERVAL HISTORY: Patient returns to clinic today for repeat laboratory work and further evaluation. He currently feels well and is asymptomatic. He denies any easy bleeding or bruising. He has had no recent fevers or illnesses.  He has a good appetite and denies weight loss.  He has no chest pain, shortness of breath, or cough.  He has no nausea, vomiting, constipation, or diarrhea.  He has no urinary complaints.  Patient offers no specific complaints today.  REVIEW OF SYSTEMS:   Review of Systems  Constitutional: Negative for fever, malaise/fatigue and weight loss.  Respiratory: Negative.  Negative for cough and shortness of breath.   Cardiovascular: Negative.  Negative for chest pain and leg swelling.  Gastrointestinal: Negative for abdominal pain, blood in stool, melena, nausea and vomiting.  Genitourinary: Negative.   Musculoskeletal: Negative.   Neurological: Negative.  Negative for weakness.  Endo/Heme/Allergies: Does not bruise/bleed easily.  Psychiatric/Behavioral: Negative.  The patient is not nervous/anxious and does not have insomnia.     As per HPI. Otherwise, a complete review of systems is negative.  PAST MEDICAL HISTORY: Past Medical History:  Diagnosis Date  . AAA (abdominal aortic aneurysm) ([place]) 1999  . Adenomatous colon polyp   . Aorta aneurysm ([place])   . Aortic aneurysm ([place]) 1999   repair  . Arthritis   . Basal cell cancer Dec. 24, 2013   Nose  . CAD (coronary artery disease)   . Cancer [facility]) Aug. 2013   prostate Seeding  implant  . Carotid artery occlusion   . Enlarged prostate 2008   Thermotherapy  . Gastroesophageal reflux  disease   . Hyperlipidemia   . Hypertension   . Pancytopenia ([place])    sees Dr. Grayland Ormond  . Peripheral vascular disease ([place])   . Psoriasis     PAST SURGICAL HISTORY: Past Surgical History:  Procedure Laterality Date  . ABDOMINAL AORTIC ANEURYSM REPAIR  1999  . CARDIAC CATHETERIZATION  10/2009  . CHOLECYSTECTOMY    . ESOPHAGOGASTRODUODENOSCOPY (EGD) WITH PROPOFOL N/A 06/26/2016   Procedure: ESOPHAGOGASTRODUODENOSCOPY (EGD) WITH PROPOFOL;  Surgeon: Lollie Sails, MD;  Location: [facility] ENDOSCOPY;  Service: Endoscopy;  Laterality: N/A;  . PROSTATE SURGERY    . SPINE SURGERY  2009   ruptured disc  . Visceral angiogram  05/14/2012   Right  groin area  . VISCERAL ANGIOGRAM N/A 05/14/2012   Procedure: VISCERAL ANGIOGRAM;  Surgeon: Elam Dutch, MD;  Location: [facility] CATH LAB;  Service: Cardiovascular;  Laterality: N/A;    FAMILY HISTORY Family History  Problem Relation Age of Onset  . Heart attack Father   . Heart disease Father     After age 7  . COPD Father   . Cancer Sister     Breast and Brain  . Heart disease Brother     before age 83  . Heart attack Brother        ADVANCED DIRECTIVES:    HEALTH MAINTENANCE: Social History  Substance Use Topics  . Smoking status: Former Smoker    Types: Cigarettes    Quit date: 04/29/1997  . Smokeless tobacco: Never Used  . Alcohol use No     Allergies  Allergen Reactions  . Ciprofloxacin  Current Outpatient Prescriptions  Medication Sig Dispense Refill  . felodipine (PLENDIL) 10 MG 24 hr tablet Take 10 mg by mouth daily.    Marland Kitchen glucosamine-chondroitin 500-400 MG tablet Take 1 tablet by mouth daily.     . Multiple Vitamin (MULTIVITAMIN) tablet Take 1 tablet by mouth daily.    Marland Kitchen oxyCODONE-acetaminophen (PERCOCET/ROXICET) 5-325 MG tablet Take by mouth.    . Probiotic CAPS Take by mouth.    . rosuvastatin (CRESTOR) 20 MG tablet Take 20 mg by mouth daily.    . sucralfate (CARAFATE) 1 g tablet Take 1 g by mouth 2 (two) times  daily.     . valsartan-hydrochlorothiazide (DIOVAN-HCT) 320-25 MG per tablet Take 1 tablet by mouth daily.  5  . morphine (MSIR) 15 MG tablet 15 mg.     No current facility-administered medications for this visit.     OBJECTIVE: Vitals:   01/26/17 1157  BP: 110/62  Pulse: 66  Temp: 97.7 F (36.5 C)     Body mass index is 24.42 kg/m.    ECOG FS:0 - Asymptomatic  General: Well-developed, well-nourished, no acute distress. Eyes: Pink conjunctiva, anicteric sclera. Lungs: Clear to auscultation bilaterally. Heart: Regular rate and rhythm. No rubs, murmurs, or gallops. Abdomen: Soft, nontender, nondistended. No organomegaly noted, normoactive bowel sounds. Musculoskeletal: No edema, cyanosis, or clubbing. Neuro: Alert, answering all questions appropriately. Cranial nerves grossly intact. Skin: No rashes or petechiae noted. Psych: Normal affect.   LAB RESULTS:  Lab Results  Component Value Date   NA 131 (L) 07/29/2016   K 3.7 07/29/2016   CL 99 (L) 07/29/2016   CO2 22 07/29/2016   GLUCOSE 106 (H) 07/29/2016   BUN 13 07/29/2016   CREATININE 1.10 11/25/2016   CALCIUM 9.4 07/29/2016   PROT 6.1 (L) 07/29/2016   ALBUMIN 4.2 07/29/2016   AST 27 07/29/2016   ALT 21 07/29/2016   ALKPHOS 48 07/29/2016   BILITOT 0.6 07/29/2016   GFRNONAA >60 07/29/2016   GFRAA >60 07/29/2016    Lab Results  Component Value Date   WBC 3.8 01/26/2017   NEUTROABS 2.1 01/26/2017   HGB 11.4 (L) 01/26/2017   HCT 32.9 (L) 01/26/2017   MCV 83.4 01/26/2017   PLT 55 (L) 01/26/2017     STUDIES: No results found.  ASSESSMENT: MDS, thrombocytopenia.  PLAN:    1. MDS: Confirmed by bone marrow biopsy. 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  Given his absolute increase in monocytes the possibility of an early/evolving CMML cannot be excluded. Cytogenics are normal 46XY. No increase in blast count. No intervention is needed at this  time. Return to clinic in 3 months with repeat laboratory work and further evaluation.  2. 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 Patient's platelet count is decreased, but approximately his baseline. If patient requires surgery for his aortic aneurysm, would recommend platelet transfusion at the time of surgery. Prednisone worked temporarily, but patient had significant side effects with treatment. 3.  Prostate cancer: Patient is status post seed implantation.  His last PSA on March 19, 2015 was reported as 0.03.  4. Aortic aneurysm: Patient requires grafting in the near future, but this procedure has been delayed secondary to patient choice. He is unclear if he wants to undergo surgery. Patient will require platelet transfusion as above. 5.  History of GI bleed: Continue monitoring by GI.  Patient has discontinued Aciphex.   Patient expressed understanding and was in agreement with this plan. He also understands  that He can call clinic at any time with any questions, concerns, or complaints.   Lloyd Huger, MD   01/28/2017 1:53 PM

## 2017-01-26 ENCOUNTER — Inpatient Hospital Stay (HOSPITAL_BASED_OUTPATIENT_CLINIC_OR_DEPARTMENT_OTHER): Payer: Medicare Other | Admitting: Oncology

## 2017-01-26 ENCOUNTER — Inpatient Hospital Stay: Payer: Medicare Other | Attending: Oncology

## 2017-01-26 VITALS — BP 110/62 | HR 66 | Temp 97.7°F | Wt 165.3 lb

## 2017-01-26 DIAGNOSIS — Z803 Family history of malignant neoplasm of breast: Secondary | ICD-10-CM | POA: Diagnosis not present

## 2017-01-26 DIAGNOSIS — D469 Myelodysplastic syndrome, unspecified: Secondary | ICD-10-CM | POA: Insufficient documentation

## 2017-01-26 DIAGNOSIS — I2510 Atherosclerotic heart disease of native coronary artery without angina pectoris: Secondary | ICD-10-CM | POA: Insufficient documentation

## 2017-01-26 DIAGNOSIS — Z87891 Personal history of nicotine dependence: Secondary | ICD-10-CM | POA: Insufficient documentation

## 2017-01-26 DIAGNOSIS — M1990 Unspecified osteoarthritis, unspecified site: Secondary | ICD-10-CM | POA: Insufficient documentation

## 2017-01-26 DIAGNOSIS — Z808 Family history of malignant neoplasm of other organs or systems: Secondary | ICD-10-CM | POA: Diagnosis not present

## 2017-01-26 DIAGNOSIS — E785 Hyperlipidemia, unspecified: Secondary | ICD-10-CM | POA: Insufficient documentation

## 2017-01-26 DIAGNOSIS — Z85828 Personal history of other malignant neoplasm of skin: Secondary | ICD-10-CM

## 2017-01-26 DIAGNOSIS — C61 Malignant neoplasm of prostate: Secondary | ICD-10-CM | POA: Diagnosis not present

## 2017-01-26 DIAGNOSIS — I10 Essential (primary) hypertension: Secondary | ICD-10-CM | POA: Diagnosis not present

## 2017-01-26 DIAGNOSIS — Z86010 Personal history of colonic polyps: Secondary | ICD-10-CM | POA: Diagnosis not present

## 2017-01-26 DIAGNOSIS — Z8719 Personal history of other diseases of the digestive system: Secondary | ICD-10-CM | POA: Insufficient documentation

## 2017-01-26 DIAGNOSIS — D696 Thrombocytopenia, unspecified: Secondary | ICD-10-CM

## 2017-01-26 DIAGNOSIS — I714 Abdominal aortic aneurysm, without rupture: Secondary | ICD-10-CM | POA: Insufficient documentation

## 2017-01-26 DIAGNOSIS — K219 Gastro-esophageal reflux disease without esophagitis: Secondary | ICD-10-CM | POA: Insufficient documentation

## 2017-01-26 LAB — CBC WITH DIFFERENTIAL/PLATELET
Basophils Absolute: 0 10*3/uL (ref 0–0.1)
Basophils Relative: 0 %
Eosinophils Absolute: 0 10*3/uL (ref 0–0.7)
Eosinophils Relative: 0 %
HCT: 32.9 % — ABNORMAL LOW (ref 40.0–52.0)
Hemoglobin: 11.4 g/dL — ABNORMAL LOW (ref 13.0–18.0)
Lymphocytes Relative: 28 %
Lymphs Abs: 1.1 10*3/uL (ref 1.0–3.6)
MCH: 29 pg (ref 26.0–34.0)
MCHC: 34.8 g/dL (ref 32.0–36.0)
MCV: 83.4 fL (ref 80.0–100.0)
Monocytes Absolute: 0.6 10*3/uL (ref 0.2–1.0)
Monocytes Relative: 16 %
Neutro Abs: 2.1 10*3/uL (ref 1.4–6.5)
Neutrophils Relative %: 56 %
Platelets: 55 10*3/uL — ABNORMAL LOW (ref 150–440)
RBC: 3.94 MIL/uL — ABNORMAL LOW (ref 4.40–5.90)
RDW: 14 % (ref 11.5–14.5)
WBC: 3.8 10*3/uL (ref 3.8–10.6)

## 2017-01-26 NOTE — Progress Notes (Signed)
Patient does not express new concerns at this time.  Vitals documented.

## 2017-04-26 NOTE — Progress Notes (Signed)
[name]  Telephone:(336) [phone] Fax:(336) [phone]  ID: Hale Drone OB: 31-Jul-1926  MR#: [id number]  TZG#:[id number]  Patient Care Team: [name], MD as PCP - General (Family Medicine) Lollie Sails, MD as Consulting Physician (Gastroenterology)  CHIEF COMPLAINT: MDS, thrombocytopenia  INTERVAL HISTORY: Patient returns to clinic today for repeat laboratory work and further evaluation. He currently feels well and is asymptomatic. He denies any easy bleeding or bruising. He has had no recent fevers or illnesses.  He has a good appetite and denies weight loss.  He has no chest pain, shortness of breath, or cough.  He has no nausea, vomiting, constipation, or diarrhea.  He has no urinary complaints.  Patient offers no specific complaints today.  REVIEW OF SYSTEMS:   Review of Systems  Constitutional: Negative for fever, malaise/fatigue and weight loss.  Respiratory: Negative.  Negative for cough and shortness of breath.   Cardiovascular: Negative.  Negative for chest pain and leg swelling.  Gastrointestinal: Negative for abdominal pain, blood in stool, melena, nausea and vomiting.  Genitourinary: Negative.   Musculoskeletal: Negative.   Neurological: Negative.  Negative for weakness.  Endo/Heme/Allergies: Does not bruise/bleed easily.  Psychiatric/Behavioral: Negative.  The patient is not nervous/anxious and does not have insomnia.     As per HPI. Otherwise, a complete review of systems is negative.  PAST MEDICAL HISTORY: Past Medical History:  Diagnosis Date  . AAA (abdominal aortic aneurysm) ([place]) 1999  . Adenomatous colon polyp   . Aorta aneurysm ([place])   . Aortic aneurysm ([place]) 1999   repair  . Arthritis   . Basal cell cancer Dec. 24, 2013   Nose  . CAD (coronary artery disease)   . Cancer [facility]) Aug. 2013   prostate Seeding  implant  . Carotid artery occlusion   . Enlarged prostate 2008   Thermotherapy  . Gastroesophageal reflux  disease   . Hyperlipidemia   . Hypertension   . Pancytopenia ([place])    sees Dr. Grayland Ormond  . Peripheral vascular disease ([place])   . Psoriasis     PAST SURGICAL HISTORY: Past Surgical History:  Procedure Laterality Date  . ABDOMINAL AORTIC ANEURYSM REPAIR  1999  . CARDIAC CATHETERIZATION  10/2009  . CHOLECYSTECTOMY    . ESOPHAGOGASTRODUODENOSCOPY (EGD) WITH PROPOFOL N/A 06/26/2016   Procedure: ESOPHAGOGASTRODUODENOSCOPY (EGD) WITH PROPOFOL;  Surgeon: Lollie Sails, MD;  Location: [facility] ENDOSCOPY;  Service: Endoscopy;  Laterality: N/A;  . PROSTATE SURGERY    . SPINE SURGERY  2009   ruptured disc  . Visceral angiogram  05/14/2012   Right  groin area  . VISCERAL ANGIOGRAM N/A 05/14/2012   Procedure: VISCERAL ANGIOGRAM;  Surgeon: Elam Dutch, MD;  Location: [facility] CATH LAB;  Service: Cardiovascular;  Laterality: N/A;    FAMILY HISTORY Family History  Problem Relation Age of Onset  . Heart attack Father   . Heart disease Father     After age 29  . COPD Father   . Cancer Sister     Breast and Brain  . Heart disease Brother     before age 105  . Heart attack Brother        ADVANCED DIRECTIVES:    HEALTH MAINTENANCE: Social History  Substance Use Topics  . Smoking status: Former Smoker    Types: Cigarettes    Quit date: 04/29/1997  . Smokeless tobacco: Never Used  . Alcohol use No     Allergies  Allergen Reactions  . Ciprofloxacin  Current Outpatient Prescriptions  Medication Sig Dispense Refill  . felodipine (PLENDIL) 10 MG 24 hr tablet Take 10 mg by mouth daily.    Marland Kitchen glucosamine-chondroitin 500-400 MG tablet Take 1 tablet by mouth daily.     . Multiple Vitamin (MULTIVITAMIN) tablet Take 1 tablet by mouth daily.    Marland Kitchen oxyCODONE-acetaminophen (PERCOCET/ROXICET) 5-325 MG tablet Take by mouth.    . Probiotic CAPS Take by mouth.    . rosuvastatin (CRESTOR) 20 MG tablet Take 20 mg by mouth daily.    . sucralfate (CARAFATE) 1 g tablet Take 1 g by mouth 2 (two) times  daily.     . valsartan-hydrochlorothiazide (DIOVAN-HCT) 320-25 MG per tablet Take 1 tablet by mouth daily.  5   No current facility-administered medications for this visit.     OBJECTIVE: Vitals:   04/27/17 1413  BP: 112/67  Pulse: 64  Temp: 97.8 F (36.6 C)     Body mass index is 24.14 kg/m.    ECOG FS:0 - Asymptomatic  General: Well-developed, well-nourished, no acute distress. Eyes: Pink conjunctiva, anicteric sclera. Lungs: Clear to auscultation bilaterally. Heart: Regular rate and rhythm. No rubs, murmurs, or gallops. Abdomen: Soft, nontender, nondistended. No organomegaly noted, normoactive bowel sounds. Musculoskeletal: No edema, cyanosis, or clubbing. Neuro: Alert, answering all questions appropriately. Cranial nerves grossly intact. Skin: No rashes or petechiae noted. Psych: Normal affect.   LAB RESULTS:  Lab Results  Component Value Date   NA 131 (L) 07/29/2016   K 3.7 07/29/2016   CL 99 (L) 07/29/2016   CO2 22 07/29/2016   GLUCOSE 106 (H) 07/29/2016   BUN 13 07/29/2016   CREATININE 1.10 11/25/2016   CALCIUM 9.4 07/29/2016   PROT 6.1 (L) 07/29/2016   ALBUMIN 4.2 07/29/2016   AST 27 07/29/2016   ALT 21 07/29/2016   ALKPHOS 48 07/29/2016   BILITOT 0.6 07/29/2016   GFRNONAA >60 07/29/2016   GFRAA >60 07/29/2016    Lab Results  Component Value Date   WBC 3.0 (L) 04/27/2017   NEUTROABS 1.5 04/27/2017   HGB 11.8 (L) 04/27/2017   HCT 34.4 (L) 04/27/2017   MCV 82.9 04/27/2017   PLT 57 (L) 04/27/2017     STUDIES: No results found.  ASSESSMENT: MDS, thrombocytopenia.  PLAN:    1. MDS: Confirmed by bone marrow biopsy. 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  Given his absolute increase in monocytes the possibility of an early/evolving CMML cannot be excluded. Cytogenics are normal 46XY. No increase in blast count. No intervention is needed at this time. Return to clinic in 3 months  with repeat laboratory work and then in 6 months for laboratory work and further evaluation.  2. 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 Patient's platelet count is decreased, but approximately his baseline. If patient requires surgery for his aortic aneurysm, would recommend platelet transfusion at the time of surgery. Prednisone worked temporarily, but patient had significant side effects with treatment. 3.  Prostate cancer: Patient is status post seed implantation.  His last PSA on March 19, 2015 was reported as 0.03.  4. Aortic aneurysm: Patient requires grafting, but patient continues to delay any intervention. Patient will require platelet transfusion as above. 5.  History of GI bleed: Continue monitoring by GI.  Patient has discontinued Aciphex.   Patient expressed understanding and was in agreement with this plan. He also understands that He can call clinic at any time with any questions, concerns, or complaints.   Kathlene November  Grayland Ormond, MD   04/27/2017 4:38 PM

## 2017-04-27 ENCOUNTER — Encounter: Payer: Self-pay | Admitting: Oncology

## 2017-04-27 ENCOUNTER — Inpatient Hospital Stay (HOSPITAL_BASED_OUTPATIENT_CLINIC_OR_DEPARTMENT_OTHER): Payer: Medicare Other | Admitting: Oncology

## 2017-04-27 ENCOUNTER — Inpatient Hospital Stay: Payer: Medicare Other | Attending: Oncology

## 2017-04-27 VITALS — BP 112/67 | HR 64 | Temp 97.8°F | Wt 163.5 lb

## 2017-04-27 DIAGNOSIS — Z86010 Personal history of colonic polyps: Secondary | ICD-10-CM | POA: Diagnosis not present

## 2017-04-27 DIAGNOSIS — Z87891 Personal history of nicotine dependence: Secondary | ICD-10-CM | POA: Insufficient documentation

## 2017-04-27 DIAGNOSIS — D61818 Other pancytopenia: Secondary | ICD-10-CM

## 2017-04-27 DIAGNOSIS — Z79899 Other long term (current) drug therapy: Secondary | ICD-10-CM

## 2017-04-27 DIAGNOSIS — Z803 Family history of malignant neoplasm of breast: Secondary | ICD-10-CM | POA: Diagnosis not present

## 2017-04-27 DIAGNOSIS — D469 Myelodysplastic syndrome, unspecified: Secondary | ICD-10-CM | POA: Insufficient documentation

## 2017-04-27 DIAGNOSIS — I739 Peripheral vascular disease, unspecified: Secondary | ICD-10-CM

## 2017-04-27 DIAGNOSIS — C61 Malignant neoplasm of prostate: Secondary | ICD-10-CM | POA: Insufficient documentation

## 2017-04-27 DIAGNOSIS — I10 Essential (primary) hypertension: Secondary | ICD-10-CM | POA: Insufficient documentation

## 2017-04-27 DIAGNOSIS — I714 Abdominal aortic aneurysm, without rupture: Secondary | ICD-10-CM | POA: Diagnosis not present

## 2017-04-27 DIAGNOSIS — K219 Gastro-esophageal reflux disease without esophagitis: Secondary | ICD-10-CM

## 2017-04-27 DIAGNOSIS — I2510 Atherosclerotic heart disease of native coronary artery without angina pectoris: Secondary | ICD-10-CM | POA: Diagnosis not present

## 2017-04-27 DIAGNOSIS — I719 Aortic aneurysm of unspecified site, without rupture: Secondary | ICD-10-CM | POA: Diagnosis not present

## 2017-04-27 DIAGNOSIS — N400 Enlarged prostate without lower urinary tract symptoms: Secondary | ICD-10-CM

## 2017-04-27 DIAGNOSIS — D696 Thrombocytopenia, unspecified: Secondary | ICD-10-CM | POA: Diagnosis not present

## 2017-04-27 DIAGNOSIS — E785 Hyperlipidemia, unspecified: Secondary | ICD-10-CM | POA: Insufficient documentation

## 2017-04-27 DIAGNOSIS — Z808 Family history of malignant neoplasm of other organs or systems: Secondary | ICD-10-CM | POA: Diagnosis not present

## 2017-04-27 DIAGNOSIS — Z85828 Personal history of other malignant neoplasm of skin: Secondary | ICD-10-CM | POA: Diagnosis not present

## 2017-04-27 LAB — CBC WITH DIFFERENTIAL/PLATELET
Basophils Absolute: 0 10*3/uL (ref 0–0.1)
Basophils Relative: 1 %
Eosinophils Absolute: 0 10*3/uL (ref 0–0.7)
Eosinophils Relative: 1 %
HCT: 34.4 % — ABNORMAL LOW (ref 40.0–52.0)
Hemoglobin: 11.8 g/dL — ABNORMAL LOW (ref 13.0–18.0)
Lymphocytes Relative: 35 %
Lymphs Abs: 1.1 10*3/uL (ref 1.0–3.6)
MCH: 28.5 pg (ref 26.0–34.0)
MCHC: 34.4 g/dL (ref 32.0–36.0)
MCV: 82.9 fL (ref 80.0–100.0)
Monocytes Absolute: 0.5 10*3/uL (ref 0.2–1.0)
Monocytes Relative: 16 %
Neutro Abs: 1.5 10*3/uL (ref 1.4–6.5)
Neutrophils Relative %: 47 %
Platelets: 57 10*3/uL — ABNORMAL LOW (ref 150–440)
RBC: 4.15 MIL/uL — ABNORMAL LOW (ref 4.40–5.90)
RDW: 14.3 % (ref 11.5–14.5)
WBC: 3 10*3/uL — ABNORMAL LOW (ref 3.8–10.6)

## 2017-06-15 ENCOUNTER — Telehealth: Payer: Self-pay | Admitting: Vascular Surgery

## 2017-06-15 NOTE — Telephone Encounter (Signed)
-----   Message from Mena Goes, RN sent at 06/12/2017  4:52 PM EDT ----- Regarding: RE: Appt Triage Question According to Dr. Barbarann Ehlers note from [name];   Lovie Macadamia, MD - 03/31/2017 2:15 PM EDT Patient presents with  Follow-up Hypertension  Subjective:  [name] is a 83 y.o. male in today for: Follow-up Patient comes in today for follow-up. Continues to follow with vascular and hematology. Last platelets were 55. Patient has decided he does not want to undergo the surgery to repair the vascular aneurysm issue. Not having any issues with it at this time. Having a lot of back problems. Still going to participate this year in the senior games. Blood pressures have been good.  So I would just make him an appt with [facility] whenever and not labs right now    ----- Message ----- From: Lujean Amel Sent: 06/12/2017   4:34 PM To: Mena Goes, RN, [facility] Subject: Appt Triage Question                           This patient called today and left a VM message wanting to schedule an appointment to see Dr.Fields. He was last seen in 11/2016 and that office note indicated he would call back if he desired surgery. So, I need some advice on what type of appointment to schedule for this patient. If he needs an ultrasound prior etc. Please advise. Thanks, Anne Ng

## 2017-06-15 NOTE — Telephone Encounter (Signed)
I called patient and scheduled an appt for him to see Dr.Fields on 07/09/17. The patient is aware of the appt. awt

## 2017-07-01 ENCOUNTER — Encounter: Payer: Self-pay | Admitting: Vascular Surgery

## 2017-07-09 ENCOUNTER — Encounter: Payer: Self-pay | Admitting: Vascular Surgery

## 2017-07-09 ENCOUNTER — Ambulatory Visit (INDEPENDENT_AMBULATORY_CARE_PROVIDER_SITE_OTHER): Payer: Medicare Other | Admitting: Vascular Surgery

## 2017-07-09 VITALS — BP 138/69 | HR 65 | Temp 97.5°F | Resp 20 | Ht 69.0 in | Wt 159.9 lb

## 2017-07-09 DIAGNOSIS — I714 Abdominal aortic aneurysm, without rupture, unspecified: Secondary | ICD-10-CM

## 2017-07-09 NOTE — Progress Notes (Signed)
Patient is an 83 year old male that we have followed in the past for bilateral asymptomatic moderate carotid stenosis. He also has a known aortic anastomotic pseudoaneurysm. He apparently had repair of the abdominal aortic aneurysm in 1999 at [facility]. He continues to deny any abdominal pain but does have chronic back pain. Previous CT scan in July 2017 showed a 15 mm pseudoaneurysm at the distal anastomosis with the remainder of the graft intact. 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 He is very active overall and continues to exercise frequently. He returns today for further consideration to consider whether or not he should have this aortic anastomotic pseudo-aneurysm repaired.  In the past he has deferred repair.  Past Medical History:  Diagnosis Date  . AAA (abdominal aortic aneurysm) ([place]) 1999  . Adenomatous colon polyp   . Aorta aneurysm ([place])   . Aortic aneurysm ([place]) 1999   repair  . Arthritis   . Basal cell cancer Dec. 24, 2013   Nose  . CAD (coronary artery disease)   . Cancer [facility]) Aug. 2013   prostate Seeding  implant  . Carotid artery occlusion   . Enlarged prostate 2008   Thermotherapy  . Gastroesophageal reflux disease   . Hyperlipidemia   . Hypertension   . Pancytopenia ([place])    sees Dr. Grayland Ormond  . Peripheral vascular disease ([place])   . Psoriasis     Past Surgical History:  Procedure Laterality Date  . ABDOMINAL AORTIC ANEURYSM REPAIR  1999  . CARDIAC CATHETERIZATION  10/2009  . CHOLECYSTECTOMY    . ESOPHAGOGASTRODUODENOSCOPY (EGD) WITH PROPOFOL N/A 06/26/2016   Procedure: ESOPHAGOGASTRODUODENOSCOPY (EGD) WITH PROPOFOL;  Surgeon: Lollie Sails, MD;  Location: [facility] ENDOSCOPY;  Service: Endoscopy;  Laterality: N/A;  . PROSTATE SURGERY    . SPINE SURGERY  2009   ruptured disc  . Visceral angiogram  05/14/2012   Right  groin area  . VISCERAL ANGIOGRAM N/A 05/14/2012   Procedure: VISCERAL ANGIOGRAM;  Surgeon:  Elam Dutch, MD;  Location: [facility] CATH LAB;  Service: Cardiovascular;  Laterality: N/A;    Current Outpatient Prescriptions on File Prior to Visit  Medication Sig Dispense Refill  . felodipine (PLENDIL) 10 MG 24 hr tablet Take 10 mg by mouth daily.    Marland Kitchen glucosamine-chondroitin 500-400 MG tablet Take 1 tablet by mouth daily.     . Multiple Vitamin (MULTIVITAMIN) tablet Take 1 tablet by mouth daily.    Marland Kitchen oxyCODONE-acetaminophen (PERCOCET/ROXICET) 5-325 MG tablet Take by mouth.    . Probiotic CAPS Take by mouth.    . rosuvastatin (CRESTOR) 20 MG tablet Take 20 mg by mouth daily.    . sucralfate (CARAFATE) 1 g tablet Take 1 g by mouth 2 (two) times daily.     . valsartan-hydrochlorothiazide (DIOVAN-HCT) 320-25 MG per tablet Take 1 tablet by mouth daily.  5   No current facility-administered medications on file prior to visit.     Allergies  Allergen Reactions  . Ciprofloxacin     Social History   Social History  . Marital status: Married    Spouse name: N/A  . Number of children: N/A  . Years of education: N/A   Occupational History  . Not on file.   Social History Main Topics  . Smoking status: Former Smoker    Types: Cigarettes    Quit date: 04/29/1997  . Smokeless tobacco: Never Used  . Alcohol use No  . Drug use: No  . Sexual activity:  Not on file   Other Topics Concern  . Not on file   Social History Narrative  . No narrative on file    Physical exam:  Vitals:   07/09/17 1048  BP: 138/69  Pulse: 65  Resp: 20  Temp: (!) 97.5 F (36.4 C)  TempSrc: Oral  SpO2: 99%  Weight: 159 lb 14.4 oz (72.5 kg)  Height: 5\' 9"  (1.753 m)    Abdomen: Soft nontender nondistended no frank pulsatile mass  Extremities: 2+ dorsalis pedis and femoral pulses bilaterally  Assessment: Known anastomotic pseudoaneurysm. Patient wishes to have CT scan to consider repair at this point.  Plan: CT Angio abdomen pelvis patient will return for follow-up after that the discussed  options for repair.  Marland KitchenJosepha Pigg

## 2017-07-10 NOTE — Addendum Note (Signed)
Addended by: Lianne Cure A on: 07/10/2017 03:04 PM   Modules accepted: Orders

## 2017-07-13 ENCOUNTER — Ambulatory Visit: Admission: RE | Admit: 2017-07-13 | Payer: Medicare Other | Source: Ambulatory Visit

## 2017-07-13 ENCOUNTER — Ambulatory Visit
Admission: RE | Admit: 2017-07-13 | Discharge: 2017-07-13 | Disposition: A | Discharge status: Home / Self Care | Payer: Medicare Other | Source: Ambulatory Visit | Attending: Vascular Surgery | Admitting: Vascular Surgery

## 2017-07-13 DIAGNOSIS — I701 Atherosclerosis of renal artery: Secondary | ICD-10-CM | POA: Diagnosis not present

## 2017-07-13 DIAGNOSIS — I7772 Dissection of iliac artery: Secondary | ICD-10-CM | POA: Diagnosis not present

## 2017-07-13 DIAGNOSIS — I714 Abdominal aortic aneurysm, without rupture, unspecified: Secondary | ICD-10-CM

## 2017-07-13 LAB — POCT I-STAT CREATININE: Creatinine, Ser: 0.9 mg/dL (ref 0.61–1.24)

## 2017-07-13 MED ORDER — IOPAMIDOL (ISOVUE-370) INJECTION 76%
100.0000 mL | Freq: Once | INTRAVENOUS | Status: AC | PRN
  Administered 2017-07-13: 100 mL via INTRAVENOUS

## 2017-07-14 ENCOUNTER — Telehealth: Payer: Self-pay | Admitting: Vascular Surgery

## 2017-07-14 NOTE — Telephone Encounter (Signed)
Per Dr.Fields staff message, I scheduled the patient to see him on Thursday 07/23/17 at 12:30pm. The patient is aware of this appt.

## 2017-07-14 NOTE — Telephone Encounter (Signed)
-----   Message from Elam Dutch, MD sent at 07/13/2017  6:25 PM EDT ----- Regarding: RE: CTA abd/pelvis Contact: [phone] Double book or shift a vein/leg swelling pt to someone else  Juanda Crumble ----- Message ----- From: Lujean Amel Sent: 07/13/2017   9:25 AM To: Elam Dutch, MD Subject: CTA abd/pelvis                                 Dr.Fields, You saw this patient on 07/09/17 and requested a CTA which is scheduled for today at 1:30pm at [facility]. As of now, you don't have any openings to see him back in the office until 08/27/17. Please advise, once you review the results, if you want me to double book you earlier than the scheduled appt of 08/27/17 or if it will be okay for him to wait until then. Thanks, Anne Ng

## 2017-07-21 ENCOUNTER — Encounter: Payer: Self-pay | Admitting: Vascular Surgery

## 2017-07-23 ENCOUNTER — Ambulatory Visit (INDEPENDENT_AMBULATORY_CARE_PROVIDER_SITE_OTHER): Payer: Medicare Other | Admitting: Vascular Surgery

## 2017-07-23 ENCOUNTER — Encounter: Payer: Self-pay | Admitting: Vascular Surgery

## 2017-07-23 VITALS — BP 141/73 | HR 59 | Temp 97.2°F | Resp 16 | Ht 69.0 in | Wt 160.0 lb

## 2017-07-23 DIAGNOSIS — I714 Abdominal aortic aneurysm, without rupture, unspecified: Secondary | ICD-10-CM

## 2017-07-23 NOTE — Progress Notes (Signed)
Patient is an 83 year old male that we have followed in the past for bilateral asymptomatic moderate carotid stenosis. He also has a known aortic anastomotic pseudoaneurysm. He apparently had repair of the abdominal aortic aneurysm in 1999 at [facility]. He continues to deny any abdominal pain but does have chronic back pain. Previous CT scan in July 2017 showed a 15 mm pseudoaneurysm at the distal anastomosis with the remainder of the graft intact. 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 He is very active overall and continues to exercise frequently. He returns today for further consideration to consider whether or not he should have this aortic anastomotic pseudo-aneurysm repaired.  In the past he has deferred repair.  He had a recent CT scan of the abdomen and pelvis and these images are reviewed today.  The component of the pseudoaneurysm was 16 mm in diameter on my review. He continues to deny abdominal or back pain.  Review of systems: He denies chest pain. He denies shortness of breath.  Physical exam:  Vitals:   07/23/17 0927 07/23/17 0931  BP: (!) 149/72 (!) 141/73  Pulse: (!) 59   Resp: 16   Temp: (!) 97.2 F (36.2 C)   TempSrc: Oral   SpO2: 100%   Weight: 160 lb (72.6 kg)   Height: 5\' 9"  (1.753 m)     Extremities: 2+ femoral pulses bilaterally  Abdomen: Soft nontender nondistended no palpable pulsatile mass  Assessment: Patient's distal anastomotic pseudoaneurysm has been stable now for one year. The pseudoaneurysm would be treatable percutaneously with a stent graft. However in the past we had considered this he was found to have a myelodysplasia with thrombocytopenia. I discussed with the patient today again the possibility of repair in the pseudoaneurysm. However it has been stable for one year. There would be some and risk involved with the procedure due to his thrombocytopenia. Although the risk of converting to an open  repair would be very low it would be much higher risk in light of his thrombocytopenia. The patient is going to think about whether or not he wishes to proceed with repair. I Certainly believe continued observation would be reasonable as well.   Plan: The patient is scheduled for a repeat abdominal aortic ultrasound in 6 months time. He will call me if he wishes to have this repaired instead a sooner date.  [name], MD [facility] Office: [phone] Pager: [phone]

## 2017-07-28 ENCOUNTER — Inpatient Hospital Stay: Payer: Medicare Other | Attending: Oncology

## 2017-07-28 DIAGNOSIS — D696 Thrombocytopenia, unspecified: Secondary | ICD-10-CM

## 2017-07-28 DIAGNOSIS — D469 Myelodysplastic syndrome, unspecified: Secondary | ICD-10-CM | POA: Insufficient documentation

## 2017-07-28 LAB — CBC WITH DIFFERENTIAL/PLATELET
Basophils Absolute: 0.2 10*3/uL — ABNORMAL HIGH (ref 0–0.1)
Basophils Relative: 4 %
Eosinophils Absolute: 0 10*3/uL (ref 0–0.7)
Eosinophils Relative: 0 %
HCT: 35 % — ABNORMAL LOW (ref 40.0–52.0)
Hemoglobin: 12.1 g/dL — ABNORMAL LOW (ref 13.0–18.0)
Lymphocytes Relative: 28 %
Lymphs Abs: 1.1 10*3/uL (ref 1.0–3.6)
MCH: 28.7 pg (ref 26.0–34.0)
MCHC: 34.6 g/dL (ref 32.0–36.0)
MCV: 82.9 fL (ref 80.0–100.0)
Monocytes Absolute: 0.6 10*3/uL (ref 0.2–1.0)
Monocytes Relative: 15 %
Neutro Abs: 2.1 10*3/uL (ref 1.4–6.5)
Neutrophils Relative %: 53 %
Platelets: 54 10*3/uL — ABNORMAL LOW (ref 150–440)
RBC: 4.22 MIL/uL — ABNORMAL LOW (ref 4.40–5.90)
RDW: 14.3 % (ref 11.5–14.5)
WBC: 3.9 10*3/uL (ref 3.8–10.6)

## 2017-08-12 NOTE — Addendum Note (Signed)
Addended by: Lianne Cure A on: 08/12/2017 12:41 PM   Modules accepted: Orders

## 2017-08-27 ENCOUNTER — Ambulatory Visit: Payer: Medicare Other | Admitting: Vascular Surgery

## 2017-09-14 ENCOUNTER — Emergency Department: Payer: Medicare Other

## 2017-09-14 ENCOUNTER — Emergency Department
Admission: EM | Admit: 2017-09-14 | Discharge: 2017-09-14 | Disposition: A | Discharge status: Home / Self Care | Payer: Medicare Other | Attending: Emergency Medicine | Admitting: Emergency Medicine

## 2017-09-14 ENCOUNTER — Encounter: Payer: Self-pay | Admitting: Emergency Medicine

## 2017-09-14 DIAGNOSIS — I10 Essential (primary) hypertension: Secondary | ICD-10-CM | POA: Diagnosis not present

## 2017-09-14 DIAGNOSIS — I2510 Atherosclerotic heart disease of native coronary artery without angina pectoris: Secondary | ICD-10-CM | POA: Diagnosis not present

## 2017-09-14 DIAGNOSIS — Z79891 Long term (current) use of opiate analgesic: Secondary | ICD-10-CM | POA: Diagnosis not present

## 2017-09-14 DIAGNOSIS — T400X5A Adverse effect of opium, initial encounter: Secondary | ICD-10-CM | POA: Insufficient documentation

## 2017-09-14 DIAGNOSIS — G8929 Other chronic pain: Secondary | ICD-10-CM | POA: Diagnosis not present

## 2017-09-14 DIAGNOSIS — K5903 Drug induced constipation: Secondary | ICD-10-CM | POA: Insufficient documentation

## 2017-09-14 DIAGNOSIS — Z87891 Personal history of nicotine dependence: Secondary | ICD-10-CM | POA: Insufficient documentation

## 2017-09-14 DIAGNOSIS — Z79899 Other long term (current) drug therapy: Secondary | ICD-10-CM | POA: Diagnosis not present

## 2017-09-14 DIAGNOSIS — K6289 Other specified diseases of anus and rectum: Secondary | ICD-10-CM | POA: Diagnosis present

## 2017-09-14 DIAGNOSIS — T402X5A Adverse effect of other opioids, initial encounter: Secondary | ICD-10-CM

## 2017-09-14 MED ORDER — DOCUSATE SODIUM 50 MG/5ML PO LIQD
50.0000 mg | Freq: Once | ORAL | Status: AC
  Administered 2017-09-14: 50 mg
  Filled 2017-09-14: qty 10

## 2017-09-14 MED ORDER — MAGNESIUM CITRATE PO SOLN
1.0000 | Freq: Once | ORAL | Status: AC
  Administered 2017-09-14: 1
  Filled 2017-09-14: qty 296

## 2017-09-14 MED ORDER — POLYETHYLENE GLYCOL 3350 17 G PO PACK: 17.0000 g | PACK | Freq: Every day | ORAL | 0 refills | Status: AC

## 2017-09-14 MED ORDER — ONDANSETRON 8 MG PO TBDP
8.0000 mg | ORAL_TABLET | Freq: Once | ORAL | Status: AC
  Administered 2017-09-14: 8 mg via ORAL
  Filled 2017-09-14: qty 1

## 2017-09-14 MED ORDER — MORPHINE SULFATE (PF) 4 MG/ML IV SOLN
4.0000 mg | Freq: Once | INTRAVENOUS | Status: DC
  Filled 2017-09-14: qty 1

## 2017-09-14 MED ORDER — DOCUSATE SODIUM 100 MG PO CAPS: 100.0000 mg | ORAL_CAPSULE | Freq: Every day | ORAL | 2 refills | Status: DC | PRN

## 2017-09-14 NOTE — ED Provider Notes (Signed)
[facility] Emergency Department Provider Note  ____________________________________________  Time seen: Approximately 3:08 PM  I have reviewed the triage vital signs and the nursing notes.   HISTORY  Chief Complaint Rectal Pain    HPI [name] is a 83 y.o. male who presents to the emergency department complaining of rectal pain. Patient reports that e takes regular rate pain medication for chronic back pain. Patient reports that he takes that on a daily basis to relieve constipation. Patient reports that he has not had a bowel movement in 3 days and today was straining and had intense rectal pain. No rectal bleeding. The patient reports that it is abnormal to go 3 days without a bowel movement.patient reports that he has a history of hemorrhoids that were surgically repaired many years ago. No repeat history of hemorrhoids. Patient denies any abdominal pain, dysuria, polyuria. No medications for this complaint other than regular pain medication prior to arrival.   Past Medical History:  Diagnosis Date  . AAA (abdominal aortic aneurysm) ([place]) 1999  . Adenomatous colon polyp   . Aorta aneurysm ([place])   . Aortic aneurysm ([place]) 1999   repair  . Arthritis   . Basal cell cancer Dec. 24, 2013   Nose  . CAD (coronary artery disease)   . Cancer [facility]) Aug. 2013   prostate Seeding  implant  . Carotid artery occlusion   . Enlarged prostate 2008   Thermotherapy  . Gastroesophageal reflux disease   . Hyperlipidemia   . Hypertension   . Pancytopenia ([place])    sees Dr. Grayland Ormond  . Peripheral vascular disease ([place])   . Psoriasis     Patient Active Problem List   Diagnosis Date Noted  . Arthritis 01/26/2017  . MDS (myelodysplastic syndrome) ([place]) 09/01/2016  . Thrombocytopenia ([place]) 08/17/2016  . Aneurysm of abdominal vessel ([place]) 07/23/2016  . Anemia 05/06/2016  . Benign essential hypertension 08/07/2015  . Bilateral carotid artery stenosis  08/07/2015  . CAD (coronary artery disease) 08/07/2015  . Hyperlipidemia, mixed 08/07/2015  . Personal history of other malignant neoplasm of skin 12/13/2012  . Chronic vascular insufficiency of intestine ([facility]) 05/24/2012  . Abdominal pain, right lower quadrant 05/24/2012  . Occlusion and stenosis of carotid artery without mention of cerebral infarction 04/29/2012    Past Surgical History:  Procedure Laterality Date  . ABDOMINAL AORTIC ANEURYSM REPAIR  1999  . CARDIAC CATHETERIZATION  10/2009  . CHOLECYSTECTOMY    . ESOPHAGOGASTRODUODENOSCOPY (EGD) WITH PROPOFOL N/A 06/26/2016   Procedure: ESOPHAGOGASTRODUODENOSCOPY (EGD) WITH PROPOFOL;  Surgeon: Lollie Sails, MD;  Location: [facility] ENDOSCOPY;  Service: Endoscopy;  Laterality: N/A;  . PROSTATE SURGERY    . SPINE SURGERY  2009   ruptured disc  . Visceral angiogram  05/14/2012   Right  groin area  . VISCERAL ANGIOGRAM N/A 05/14/2012   Procedure: VISCERAL ANGIOGRAM;  Surgeon: Elam Dutch, MD;  Location: [facility] CATH LAB;  Service: Cardiovascular;  Laterality: N/A;    Prior to Admission medications   Medication Sig Start Date End Date Taking? Authorizing Provider  docusate sodium (COLACE) 100 MG capsule Take 1 capsule (100 mg total) by mouth daily as needed. 09/14/17 09/14/18  Damary Doland, Charline Bills, PA-C  felodipine (PLENDIL) 10 MG 24 hr tablet Take 10 mg by mouth daily.    [provider]  glucosamine-chondroitin 500-400 MG tablet Take 1 tablet by mouth daily.     [provider]  Multiple Vitamin (MULTIVITAMIN) tablet Take 1 tablet by mouth daily.  [provider]  oxyCODONE-acetaminophen (PERCOCET/ROXICET) 5-325 MG tablet Take by mouth. 09/08/16   [provider]  polyethylene glycol (MIRALAX / GLYCOLAX) packet Take 17 g by mouth daily. 09/14/17   Fortino Haag, Charline Bills, PA-C  Probiotic CAPS Take by mouth.    [provider]  rosuvastatin (CRESTOR) 20 MG tablet Take 20 mg by mouth daily.     [provider]  sucralfate (CARAFATE) 1 g tablet Take 1 g by mouth 2 (two) times daily.     [provider]  valsartan-hydrochlorothiazide (DIOVAN-HCT) 320-25 MG per tablet Take 1 tablet by mouth daily. 08/11/15   [provider]    Allergies Ciprofloxacin  Family History  Problem Relation Age of Onset  . Heart attack Father   . Heart disease Father        After age 32  . COPD Father   . Cancer Sister        Breast and Brain  . Heart disease Brother        before age 8  . Heart attack Brother     Social History Social History  Substance Use Topics  . Smoking status: Former Smoker    Types: Cigarettes    Quit date: 04/29/1997  . Smokeless tobacco: Never Used  . Alcohol use No     Review of Systems  Constitutional: No fever/chills Eyes: No visual changes. No discharge ENT: No upper respiratory complaints. Cardiovascular: no chest pain. Respiratory: no cough. No SOB. Gastrointestinal: No abdominal pain.  No nausea, no vomiting.  No diarrhea.  positive constipation.positive for rectal pain. Genitourinary: Negative for dysuria. No hematuria Musculoskeletal: Negative for musculoskeletal pain. Skin: Negative for rash, abrasions, lacerations, ecchymosis. Neurological: Negative for headaches, focal weakness or numbness. 10-point ROS otherwise negative.  ____________________________________________   PHYSICAL EXAM:  VITAL SIGNS: ED Triage Vitals  Enc Vitals Group     BP 09/14/17 1427 131/65     Pulse Rate 09/14/17 1427 90     Resp 09/14/17 1427 18     Temp 09/14/17 1427 97.8 F (36.6 C)     Temp src --      SpO2 09/14/17 1427 100 %     Weight 09/14/17 1429 160 lb (72.6 kg)     Height 09/14/17 1429 5\' 9"  (1.753 m)     Head Circumference --      Peak Flow --      Pain Score 09/14/17 1426 10     Pain Loc --      Pain Edu? --      Excl. in [place]? --      Constitutional: Alert and oriented. Well appearing and in no acute distress. Eyes:  Conjunctivae are normal. PERRL. EOMI. Head: Atraumatic. ENT:      Ears:       Nose: No congestion/rhinnorhea.      Mouth/Throat: Mucous membranes are moist.  Neck: No stridor.    Cardiovascular: Normal rate, regular rhythm. Normal S1 and S2.  Good peripheral circulation. Respiratory: Normal respiratory effort without tachypnea or retractions. Lungs CTAB. Good air entry to the bases with no decreased or absent breath sounds. Gastrointestinal: Bowel sounds 4 quadrants. Soft and nontender to palpation. No guarding or rigidity. No palpable masses. No distention. No CVA tenderness. Rectal exam reveals hard stool burden. No visible hemorrhoids. Negative Hemoccult on exam. Musculoskeletal: Full range of motion to all extremities. No gross deformities appreciated. Neurologic:  Normal speech and language. No gross focal neurologic deficits are appreciated.  Skin:  Skin is warm, dry and intact. No rash noted. Psychiatric: Mood and affect are normal. Speech and behavior are normal. Patient exhibits appropriate insight and judgement.   ____________________________________________   LABS (all labs ordered are listed, but only abnormal results are displayed)  Labs Reviewed - No data to display ____________________________________________  EKG   ____________________________________________  RADIOLOGY [facility] [place] [name], personally viewed and evaluated these images (plain radiographs) as part of my medical decision making, as well as reviewing the written report by the radiologist.  Dg Abdomen 1 View  Result Date: 09/14/2017 CLINICAL DATA:  Constipation, rectal pain with attempted bowel movement today EXAM: ABDOMEN - 1 VIEW COMPARISON:  CT abdomen pelvis 07/13/2017 FINDINGS: Significantly increased stool in rectum. Bowel gas pattern otherwise normal. No bowel dilatation or bowel wall thickening. Bones demineralized with degenerative disc and facet disease changes of the lumbar spine.  Atherosclerotic calcifications aorta and splenic artery. Known abdominal aortic aneurysm though dimensions are inadequately visualized on an AP abdominal radiograph. No definite urinary tract calcification. IMPRESSION: Significantly increased stool in rectum. Electronically Signed   By: Lavonia Dana M.D.   On: 09/14/2017 15:57    ____________________________________________    PROCEDURES  Procedure(s) performed:    Procedures    Medications  morphine 4 MG/ML injection 4 mg (4 mg Intramuscular Refused 09/14/17 1614)  docusate (COLACE) 50 MG/5ML liquid 50 mg (50 mg Per Tube Given 09/14/17 1620)  magnesium citrate solution 1 Bottle (1 Bottle Per Tube Given 09/14/17 1620)  ondansetron (ZOFRAN-ODT) disintegrating tablet 8 mg (8 mg Oral Given 09/14/17 1615)     ____________________________________________   INITIAL IMPRESSION / ASSESSMENT AND PLAN / ED COURSE  Pertinent labs & imaging results that were available during my care of the patient were reviewed by me and considered in my medical decision making (see chart for details).  Review of the [place] CSRS was performed in accordance of the [place] prior to dispensing any controlled drugs.  Clinical Course as of Sep 14 1640  Mon Sep 14, 2017  1537 Patient is felt to have opioid-induced constipation. Patient takes in on a daily basis but has been constipated 3 days. Patient complaining of severe rectal pain. No rectal bleeding on Hemoccult. Rectal exam reveals hard stool pertinent. X-ray ordered to evaluate for obstruction. Patient will be given enema to relieve constipation.  [JC]  8657 X-ray reveals no colonic obstruction. Enema is given with good relief.  [JC]    Clinical Course User Index [JC] Honesti Seaberg, Charline Bills, PA-C    Patient's diagnosis is consistent with (weight and his constipation with rectal pain from constipation. X-ray reveals no acute obstruction. Enema given with good relief.. Patient will be discharged home with  prescriptions for MiraLAX and Colace to take in addition to his senna.. Patient is to follow up with primary care as needed or otherwise directed. Patient is given ED precautions to return to the ED for any worsening or new symptoms.     ____________________________________________  FINAL CLINICAL IMPRESSION(S) / ED DIAGNOSES  Final diagnoses:  Therapeutic opioid induced constipation  Rectal pain      NEW MEDICATIONS STARTED DURING THIS VISIT:  New Prescriptions   DOCUSATE SODIUM (COLACE) 100 MG CAPSULE    Take 1 capsule (100 mg total) by mouth daily as needed.   POLYETHYLENE GLYCOL (MIRALAX / GLYCOLAX) PACKET    Take 17 g by mouth daily.        This chart was dictated using voice recognition software/Dragon. Despite best efforts to proofread,  errors can occur which can change the meaning. Any change was purely unintentional.    Darletta Moll, PA-C 09/14/17 1641    Darel Hong, MD 09/14/17 [phone]

## 2017-09-14 NOTE — ED Triage Notes (Signed)
States had rectal pain with attempted BM today. States put his finger "back there" and "feels like marbles". Denies having trouble with hemorrhoids.

## 2017-09-14 NOTE — ED Notes (Signed)
Patient had a large, brown, solid stool mixed with brown, liquid stool.

## 2017-10-29 ENCOUNTER — Other Ambulatory Visit: Payer: Medicare Other

## 2017-10-29 ENCOUNTER — Ambulatory Visit: Payer: Medicare Other | Admitting: Oncology

## 2017-11-03 DIAGNOSIS — D692 Other nonthrombocytopenic purpura: Secondary | ICD-10-CM | POA: Insufficient documentation

## 2017-11-03 DIAGNOSIS — K5903 Drug induced constipation: Secondary | ICD-10-CM | POA: Insufficient documentation

## 2017-11-03 DIAGNOSIS — G8929 Other chronic pain: Secondary | ICD-10-CM | POA: Insufficient documentation

## 2017-11-03 DIAGNOSIS — T402X5A Adverse effect of other opioids, initial encounter: Secondary | ICD-10-CM | POA: Insufficient documentation

## 2017-11-03 DIAGNOSIS — H903 Sensorineural hearing loss, bilateral: Secondary | ICD-10-CM | POA: Insufficient documentation

## 2017-12-26 DIAGNOSIS — Z79899 Other long term (current) drug therapy: Secondary | ICD-10-CM | POA: Insufficient documentation

## 2017-12-26 DIAGNOSIS — G894 Chronic pain syndrome: Secondary | ICD-10-CM | POA: Insufficient documentation

## 2017-12-26 DIAGNOSIS — M961 Postlaminectomy syndrome, not elsewhere classified: Secondary | ICD-10-CM | POA: Insufficient documentation

## 2017-12-26 DIAGNOSIS — M25511 Pain in right shoulder: Secondary | ICD-10-CM | POA: Insufficient documentation

## 2017-12-27 NOTE — Progress Notes (Signed)
Thomas  [phone]) [phone] Fax:(336) [phone]  ID: [name] OB: 1925/12/30  MR#: [id number]  KKX#:[id number]  Patient Care Team: Thomas Pink, MD as PCP - General (Family Medicine) Thomas Sails, MD as Consulting Physician (Gastroenterology)  CHIEF COMPLAINT: MDS, thrombocytopenia  INTERVAL HISTORY: Patient returns to clinic today for repeat laboratory work and further evaluation. He continues to feel well and is asymptomatic.  He has no neurologic complaints.  He denies any easy bleeding or bruising. He denies any recent fevers or illnesses.  He has a good appetite and denies weight loss.  He has no chest pain, shortness of breath, or cough.  He has no nausea, vomiting, constipation, or diarrhea.  He has no urinary complaints.  Patient offers no specific complaints today.  REVIEW OF SYSTEMS:   Review of Systems  Constitutional: Negative for fever, malaise/fatigue and weight loss.  Respiratory: Negative.  Negative for cough and shortness of breath.   Cardiovascular: Negative.  Negative for chest pain and leg swelling.  Gastrointestinal: Negative for abdominal pain, blood in stool, melena, nausea and vomiting.  Genitourinary: Negative.  Negative for hematuria.  Musculoskeletal: Negative.   Skin: Negative.  Negative for rash.  Neurological: Negative.  Negative for sensory change and weakness.  Endo/Heme/Allergies: Does not bruise/bleed easily.  Psychiatric/Behavioral: Negative.  The patient is not nervous/anxious and does not have insomnia.     As per HPI. Otherwise, a complete review of systems is negative.  PAST MEDICAL HISTORY: Past Medical History:  Diagnosis Date  . AAA (abdominal aortic aneurysm) ([place]) 1999  . Adenomatous colon polyp   . Aorta aneurysm ([place])   . Aortic aneurysm ([place]) 1999   repair  . Arthritis   . Basal cell cancer Dec. 24, 2013   Nose  . CAD (coronary artery disease)   . Cancer [facility]) Aug. 2013   prostate  Seeding  implant  . Carotid artery occlusion   . Enlarged prostate 2008   Thermotherapy  . Gastroesophageal reflux disease   . Hyperlipidemia   . Hypertension   . Pancytopenia ([place])    sees Dr. Grayland Meyer  . Peripheral vascular disease ([place])   . Psoriasis     PAST SURGICAL HISTORY: Past Surgical History:  Procedure Laterality Date  . ABDOMINAL AORTIC ANEURYSM REPAIR  1999  . CARDIAC CATHETERIZATION  10/2009  . CHOLECYSTECTOMY    . ESOPHAGOGASTRODUODENOSCOPY (EGD) WITH PROPOFOL N/A 06/26/2016   Procedure: ESOPHAGOGASTRODUODENOSCOPY (EGD) WITH PROPOFOL;  Surgeon: Thomas Sails, MD;  Location: [facility] ENDOSCOPY;  Service: Endoscopy;  Laterality: N/A;  . PROSTATE SURGERY    . SPINE SURGERY  2009   ruptured disc  . Visceral angiogram  05/14/2012   Right  groin area  . VISCERAL ANGIOGRAM N/A 05/14/2012   Procedure: VISCERAL ANGIOGRAM;  Surgeon: Thomas Dutch, MD;  Location: [facility] CATH LAB;  Service: Cardiovascular;  Laterality: N/A;    FAMILY HISTORY Family History  Problem Relation Age of Onset  . Heart attack Father   . Heart disease Father        After age 42  . COPD Father   . Cancer Sister        Breast and Brain  . Heart disease Brother        before age 20  . Heart attack Brother        ADVANCED DIRECTIVES:    HEALTH MAINTENANCE: Social History   Tobacco Use  . Smoking status: Former Smoker    Types: Cigarettes  Last attempt to quit: 04/29/1997    Years since quitting: 20.6  . Smokeless tobacco: Never Used  Substance Use Topics  . Alcohol use: No  . Drug use: No     Allergies  Allergen Reactions  . Ciprofloxacin     Current Outpatient Medications  Medication Sig Dispense Refill  . felodipine (PLENDIL) 10 MG 24 hr tablet Take 10 mg by mouth daily.    Marland Kitchen glucosamine-chondroitin 500-400 MG tablet Take 1 tablet by mouth daily.     . Multiple Vitamin (MULTIVITAMIN) tablet Take 1 tablet by mouth daily.    Marland Kitchen oxyCODONE-acetaminophen (PERCOCET/ROXICET) 5-325  MG tablet Take by mouth.    . polyethylene glycol (MIRALAX / GLYCOLAX) packet Take 17 g by mouth daily. 14 each 0  . Probiotic CAPS Take by mouth.    . rosuvastatin (CRESTOR) 20 MG tablet Take 20 mg by mouth daily.    . valsartan-hydrochlorothiazide (DIOVAN-HCT) 320-25 MG per tablet Take 1 tablet by mouth daily.  5  . docusate sodium (COLACE) 100 MG capsule Take 1 capsule (100 mg total) by mouth daily as needed. (Patient not taking: Reported on 12/29/2017) 30 capsule 2  . sucralfate (CARAFATE) 1 g tablet Take 1 g by mouth 2 (two) times daily.      No current facility-administered medications for this visit.     OBJECTIVE: Vitals:   12/29/17 1431  BP: 126/71  Pulse: (!) 58  Resp: 20  Temp: (!) 96.6 F (35.9 C)     Body mass index is 23.47 kg/m.    ECOG FS:0 - Asymptomatic  General: Well-developed, well-nourished, no acute distress. Eyes: Meyer conjunctiva, anicteric sclera. Lungs: Clear to auscultation bilaterally. Heart: Regular rate and rhythm. No rubs, murmurs, or gallops. Abdomen: Soft, nontender, nondistended. No organomegaly noted, normoactive bowel sounds. Musculoskeletal: No edema, cyanosis, or clubbing. Neuro: Alert, answering all questions appropriately. Cranial nerves grossly intact. Skin: No rashes or petechiae noted. Psych: Normal affect.   LAB RESULTS:  Lab Results  Component Value Date   NA 131 (L) 07/29/2016   K 3.7 07/29/2016   CL 99 (L) 07/29/2016   CO2 22 07/29/2016   GLUCOSE 106 (H) 07/29/2016   BUN 13 07/29/2016   CREATININE 0.90 07/13/2017   CALCIUM 9.4 07/29/2016   PROT 6.1 (L) 07/29/2016   ALBUMIN 4.2 07/29/2016   AST 27 07/29/2016   ALT 21 07/29/2016   ALKPHOS 48 07/29/2016   BILITOT 0.6 07/29/2016   GFRNONAA >60 07/29/2016   GFRAA >60 07/29/2016    Lab Results  Component Value Date   WBC 3.6 (L) 12/29/2017   NEUTROABS 1.9 12/29/2017   HGB 11.6 (L) 12/29/2017   HCT 34.7 (L) 12/29/2017   MCV 84.5 12/29/2017   PLT 44 (L) 12/29/2017      STUDIES: No results found.  ASSESSMENT: MDS, thrombocytopenia.  PLAN:    1. MDS: Confirmed by bone marrow biopsy. 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  Given his absolute increase in monocytes the possibility of an early/evolving CMML cannot be excluded. Cytogenics are normal 46XY. No increase in blast count. No intervention is needed at this time. Return to clinic in 3 months with repeat laboratory work and then in 6 months for laboratory work and further evaluation.   2. 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 Patient's platelet count is decreased, but approximately his baseline. If patient requires surgery for his aortic aneurysm, would recommend platelet transfusion at the time of surgery. Prednisone worked temporarily, but  patient had significant side effects with treatment. 3.  Prostate cancer: Patient is status post seed implantation.  His last PSA on March 19, 2015 was reported as 0.03.  4. Aortic aneurysm: Patient requires grafting, but patient continues to decline any intervention. Patient will require platelet transfusion as above. 5.  History of GI bleed: Continue monitoring by GI.  Patient has discontinued Aciphex.   Patient expressed understanding and was in agreement with this plan. He also understands that He can call clinic at any time with any questions, concerns, or complaints.   Lloyd Huger, MD   01/02/2018 8:57 AM

## 2017-12-29 ENCOUNTER — Inpatient Hospital Stay: Payer: Medicare Other | Attending: Oncology

## 2017-12-29 ENCOUNTER — Encounter: Payer: Self-pay | Admitting: Oncology

## 2017-12-29 ENCOUNTER — Inpatient Hospital Stay (HOSPITAL_BASED_OUTPATIENT_CLINIC_OR_DEPARTMENT_OTHER): Payer: Medicare Other | Admitting: Oncology

## 2017-12-29 ENCOUNTER — Other Ambulatory Visit: Payer: Self-pay

## 2017-12-29 VITALS — BP 126/71 | HR 58 | Temp 96.6°F | Resp 20 | Wt 158.9 lb

## 2017-12-29 DIAGNOSIS — E785 Hyperlipidemia, unspecified: Secondary | ICD-10-CM | POA: Insufficient documentation

## 2017-12-29 DIAGNOSIS — Z8719 Personal history of other diseases of the digestive system: Secondary | ICD-10-CM | POA: Diagnosis not present

## 2017-12-29 DIAGNOSIS — D469 Myelodysplastic syndrome, unspecified: Secondary | ICD-10-CM | POA: Insufficient documentation

## 2017-12-29 DIAGNOSIS — K219 Gastro-esophageal reflux disease without esophagitis: Secondary | ICD-10-CM

## 2017-12-29 DIAGNOSIS — I10 Essential (primary) hypertension: Secondary | ICD-10-CM

## 2017-12-29 DIAGNOSIS — Z79899 Other long term (current) drug therapy: Secondary | ICD-10-CM

## 2017-12-29 DIAGNOSIS — M1990 Unspecified osteoarthritis, unspecified site: Secondary | ICD-10-CM | POA: Diagnosis not present

## 2017-12-29 DIAGNOSIS — I719 Aortic aneurysm of unspecified site, without rupture: Secondary | ICD-10-CM | POA: Diagnosis not present

## 2017-12-29 DIAGNOSIS — I739 Peripheral vascular disease, unspecified: Secondary | ICD-10-CM

## 2017-12-29 DIAGNOSIS — Z8546 Personal history of malignant neoplasm of prostate: Secondary | ICD-10-CM

## 2017-12-29 DIAGNOSIS — Z85828 Personal history of other malignant neoplasm of skin: Secondary | ICD-10-CM | POA: Insufficient documentation

## 2017-12-29 DIAGNOSIS — D6959 Other secondary thrombocytopenia: Secondary | ICD-10-CM | POA: Diagnosis not present

## 2017-12-29 DIAGNOSIS — I2510 Atherosclerotic heart disease of native coronary artery without angina pectoris: Secondary | ICD-10-CM | POA: Diagnosis not present

## 2017-12-29 DIAGNOSIS — Z87891 Personal history of nicotine dependence: Secondary | ICD-10-CM | POA: Diagnosis not present

## 2017-12-29 DIAGNOSIS — D696 Thrombocytopenia, unspecified: Secondary | ICD-10-CM

## 2017-12-29 LAB — CBC WITH DIFFERENTIAL/PLATELET
Basophils Absolute: 0 10*3/uL (ref 0–0.1)
Basophils Relative: 1 %
Eosinophils Absolute: 0 10*3/uL (ref 0–0.7)
Eosinophils Relative: 1 %
HCT: 34.7 % — ABNORMAL LOW (ref 40.0–52.0)
Hemoglobin: 11.6 g/dL — ABNORMAL LOW (ref 13.0–18.0)
Lymphocytes Relative: 31 %
Lymphs Abs: 1.1 10*3/uL (ref 1.0–3.6)
MCH: 28.1 pg (ref 26.0–34.0)
MCHC: 33.3 g/dL (ref 32.0–36.0)
MCV: 84.5 fL (ref 80.0–100.0)
Monocytes Absolute: 0.6 10*3/uL (ref 0.2–1.0)
Monocytes Relative: 17 %
Neutro Abs: 1.9 10*3/uL (ref 1.4–6.5)
Neutrophils Relative %: 50 %
Platelets: 44 10*3/uL — ABNORMAL LOW (ref 150–440)
RBC: 4.11 MIL/uL — ABNORMAL LOW (ref 4.40–5.90)
RDW: 15.4 % — ABNORMAL HIGH (ref 11.5–14.5)
Smear Review: DECREASED
WBC: 3.6 10*3/uL — ABNORMAL LOW (ref 3.8–10.6)

## 2017-12-29 NOTE — Progress Notes (Signed)
Patient denies any concerns today.  

## 2018-01-28 ENCOUNTER — Ambulatory Visit (INDEPENDENT_AMBULATORY_CARE_PROVIDER_SITE_OTHER): Payer: Medicare Other | Admitting: Family

## 2018-01-28 ENCOUNTER — Other Ambulatory Visit: Payer: Self-pay

## 2018-01-28 ENCOUNTER — Ambulatory Visit (HOSPITAL_COMMUNITY)
Admission: RE | Admit: 2018-01-28 | Discharge: 2018-01-28 | Disposition: A | Discharge status: Home / Self Care | Payer: Medicare Other | Source: Ambulatory Visit | Attending: Family | Admitting: Family

## 2018-01-28 ENCOUNTER — Encounter: Payer: Self-pay | Admitting: Family

## 2018-01-28 VITALS — BP 128/69 | HR 56 | Temp 97.0°F | Resp 16 | Ht 69.0 in | Wt 157.0 lb

## 2018-01-28 DIAGNOSIS — I714 Abdominal aortic aneurysm, without rupture, unspecified: Secondary | ICD-10-CM

## 2018-01-28 DIAGNOSIS — I6523 Occlusion and stenosis of bilateral carotid arteries: Secondary | ICD-10-CM | POA: Diagnosis not present

## 2018-01-28 NOTE — Patient Instructions (Signed)
 Abdominal Aortic Aneurysm Blood pumps away from the heart through tubes (blood vessels) called arteries. Aneurysms are weak or damaged places in the wall of an artery. It bulges out like a balloon. An abdominal aortic aneurysm happens in the main artery of the body (aorta). It can burst or tear, causing bleeding inside the body. This is an emergency. It needs treatment right away. What are the causes? The exact cause is unknown. Things that could cause this problem include:  Fat and other substances building up in the lining of a tube.  Swelling of the walls of a blood vessel.  Certain tissue diseases.  Belly (abdominal) trauma.  An infection in the main artery of the body.  What increases the risk? There are things that make it more likely for you to have an aneurysm. These include:  Being over the age of 82 years old.  Having high blood pressure (hypertension).  Being a male.  Being white.  Being very overweight (obese).  Having a family history of aneurysm.  Using tobacco products.  What are the signs or symptoms? Symptoms depend on the size of the aneurysm and how fast it grows. There may not be symptoms. If symptoms occur, they can include:  Pain (belly, side, lower back, or groin).  Feeling full after eating a small amount of food.  Feeling sick to your stomach (nauseous), throwing up (vomiting), or both.  Feeling a lump in your belly that feels like it is beating (pulsating).  Feeling like you will pass out (faint).  How is this treated?  Medicine to control blood pressure and pain.  Imaging tests to see if the aneurysm gets bigger.  Surgery. How is this prevented? To lessen your chance of getting this condition:  Stop smoking. Stop chewing tobacco.  Limit or avoid alcohol.  Keep your blood pressure, blood sugar, and cholesterol within normal limits.  Eat less salt.  Eat foods low in saturated fats and cholesterol. These are found in animal  and whole dairy products.  Eat more fiber. Fiber is found in whole grains, vegetables, and fruits.  Keep a healthy weight.  Stay active and exercise often.  This information is not intended to replace advice given to you by your health care provider. Make sure you discuss any questions you have with your health care provider. Document Released: 04/04/2013 Document Revised: 05/15/2016 Document Reviewed: 01/07/2013 Elsevier Interactive Patient Education  2017 Elsevier Inc.     Stroke Prevention Some health problems and behaviors may make it more likely for you to have a stroke. Below are ways to lessen your risk of having a stroke.  Be active for at least 30 minutes on most or all days.  Do not smoke. Try not to be around others who smoke.  Do not drink too much alcohol. ? Do not have more than 2 drinks a day if you are a man. ? Do not have more than 1 drink a day if you are a woman and are not pregnant.  Eat healthy foods, such as fruits and vegetables. If you were put on a specific diet, follow the diet as told.  Keep your cholesterol levels under control through diet and medicines. Look for foods that are low in saturated fat, trans fat, cholesterol, and are high in fiber.  If you have diabetes, follow all diet plans and take your medicine as told.  Ask your doctor if you need treatment to lower your blood pressure. If you have high blood   pressure (hypertension), follow all diet plans and take your medicine as told by your doctor.  If you are 18-39 years old, have your blood pressure checked every 3-5 years. If you are age 40 or older, have your blood pressure checked every year.  Keep a healthy weight. Eat foods that are low in calories, salt, saturated fat, trans fat, and cholesterol.  Do not take drugs.  Avoid birth control pills, if this applies. Talk to your doctor about the risks of taking birth control pills.  Talk to your doctor if you have sleep problems (sleep  apnea).  Take all medicine as told by your doctor. ? You may be told to take aspirin or blood thinner medicine. Take this medicine as told by your doctor. ? Understand your medicine instructions.  Make sure any other conditions you have are being taken care of.  Get help right away if:  You suddenly lose feeling (you feel numb) or have weakness in your face, arm, or leg.  Your face or eyelid hangs down to one side.  You suddenly feel confused.  You have trouble talking (aphasia) or understanding what people are saying.  You suddenly have trouble seeing in one or both eyes.  You suddenly have trouble walking.  You are dizzy.  You lose your balance or your movements are clumsy (uncoordinated).  You suddenly have a very bad headache and you do not know the cause.  You have new chest pain.  Your heart feels like it is fluttering or skipping a beat (irregular heartbeat). Do not wait to see if the symptoms above go away. Get help right away. Call your local emergency services (911 in [place] Do not drive yourself to the hospital. This information is not intended to replace advice given to you by your health care provider. Make sure you discuss any questions you have with your health care provider. Document Released: 06/08/2012 Document Revised: 05/15/2016 Document Reviewed: 06/10/2013 Elsevier Interactive Patient Education  2018 Elsevier Inc.  

## 2018-01-28 NOTE — Progress Notes (Signed)
[facility] [place] HISTORY AND PHYSICAL   CC: Follow up anastomotic pseudoaneurysm    History of Present Illness:   [name] is a 84 y.o. male that we have followed in the past for bilateral asymptomatic moderate carotid stenosis. He also has a known aortic anastomotic pseudoaneurysm. He apparently had repair of the abdominal aortic aneurysm in 1999 at [facility]. He continues to deny any abdominal pain but does have chronic back pain. Previous CT scan in July 2017 showed a 15 mm pseudoaneurysm at the distal anastomosis with the remainder of the graft intact. 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 He is very active overall and continues to exercise frequently.  He had a recent CT scan of the abdomen and pelvis and these images were reviewed by Dr. Oneida Alar on 07-23-17.   The component of the pseudoaneurysm was 16 mm in diameter. He continued to deny abdominal or back pain.  Dr. Oneida Alar last evaluated pt on 07-23-17. At that time patient's distal anastomotic pseudoaneurysm had been stable for one year. The pseudoaneurysm would be treatable percutaneously with a stent graft. However in the past we had considered this he was found to have a myelodysplasia with thrombocytopenia. Dr. Oneida Alar discussed with the patient that day again the possibility of repair in the pseudoaneurysm. However it has been stable for one year. There would be some and risk involved with the procedure due to his thrombocytopenia. Although the risk of converting to an open repair would be very low it would be much higher risk in light of his thrombocytopenia. The patient was going to think about whether or not he wishes to proceed with repair. Dr. Oneida Alar certainly believes continued observation would be reasonable as well.  The patient was scheduled for a repeat abdominal aortic ultrasound in 6 months time. He will call Dr. Oneida Alar if he wishes to have this  repaired instead a sooner date.  Pt states he exercises daily for an hour.  He has chronic back pain for which he takes oxycodone prescribed by Dr. Nelva Bush, his orthopod,  no new back pain, denies abdominal pain.   Dr. Grayland Ormond is his hematologist at [facility].    Diabetic: No  Tobacco use: former smoker, quit in 1998  Pt meds include: Statin :Yes Betablocker: No ASA: No Other anticoagulants/antiplatelets: no  Current Outpatient Medications  Medication Sig Dispense Refill  . felodipine (PLENDIL) 10 MG 24 hr tablet Take 10 mg by mouth daily.    Marland Kitchen glucosamine-chondroitin 500-400 MG tablet Take 1 tablet by mouth daily.     . Multiple Vitamin (MULTIVITAMIN) tablet Take 1 tablet by mouth daily.    Marland Kitchen oxyCODONE-acetaminophen (PERCOCET/ROXICET) 5-325 MG tablet Take by mouth.    . polyethylene glycol (MIRALAX / GLYCOLAX) packet Take 17 g by mouth daily. 14 each 0  . rosuvastatin (CRESTOR) 20 MG tablet Take 20 mg by mouth daily.    . valsartan-hydrochlorothiazide (DIOVAN-HCT) 320-25 MG per tablet Take 1 tablet by mouth daily.  5   No current facility-administered medications for this visit.     Past Medical History:  Diagnosis Date  . AAA (abdominal aortic aneurysm) ([place]) 1999  . Adenomatous colon polyp   . Aorta aneurysm ([place])   . Aortic aneurysm ([place]) 1999   repair  . Arthritis   . Basal cell cancer Dec. 24, 2013   Nose  . CAD (coronary artery disease)   . Cancer [facility]) Aug. 2013   prostate Seeding  implant  . Carotid artery occlusion   . Enlarged prostate 2008   Thermotherapy  . Gastroesophageal reflux disease   . Hyperlipidemia   . Hypertension   . Pancytopenia ([place])    sees Dr. Grayland Ormond  . Peripheral vascular disease ([place])   . Psoriasis     Social History Social History   Tobacco Use  . Smoking status: Former Smoker    Types: Cigarettes    Last attempt to quit: 04/29/1997    Years since quitting: 20.7  . Smokeless tobacco: Never Used  Substance Use Topics  . Alcohol  use: No  . Drug use: No    Family History Family History  Problem Relation Age of Onset  . Heart attack Father   . Heart disease Father        After age 61  . COPD Father   . Cancer Sister        Breast and Brain  . Heart disease Brother        before age 37  . Heart attack Brother     Surgical History Past Surgical History:  Procedure Laterality Date  . ABDOMINAL AORTIC ANEURYSM REPAIR  1999  . CARDIAC CATHETERIZATION  10/2009  . CHOLECYSTECTOMY    . ESOPHAGOGASTRODUODENOSCOPY (EGD) WITH PROPOFOL N/A 06/26/2016   Procedure: ESOPHAGOGASTRODUODENOSCOPY (EGD) WITH PROPOFOL;  Surgeon: Lollie Sails, MD;  Location: [facility] ENDOSCOPY;  Service: Endoscopy;  Laterality: N/A;  . PROSTATE SURGERY    . SPINE SURGERY  2009   ruptured disc  . Visceral angiogram  05/14/2012   Right  groin area  . VISCERAL ANGIOGRAM N/A 05/14/2012   Procedure: VISCERAL ANGIOGRAM;  Surgeon: Elam Dutch, MD;  Location: [facility] CATH LAB;  Service: Cardiovascular;  Laterality: N/A;    Allergies  Allergen Reactions  . Ciprofloxacin     Current Outpatient Medications  Medication Sig Dispense Refill  . felodipine (PLENDIL) 10 MG 24 hr tablet Take 10 mg by mouth daily.    Marland Kitchen glucosamine-chondroitin 500-400 MG tablet Take 1 tablet by mouth daily.     . Multiple Vitamin (MULTIVITAMIN) tablet Take 1 tablet by mouth daily.    Marland Kitchen oxyCODONE-acetaminophen (PERCOCET/ROXICET) 5-325 MG tablet Take by mouth.    . polyethylene glycol (MIRALAX / GLYCOLAX) packet Take 17 g by mouth daily. 14 each 0  . rosuvastatin (CRESTOR) 20 MG tablet Take 20 mg by mouth daily.    . valsartan-hydrochlorothiazide (DIOVAN-HCT) 320-25 MG per tablet Take 1 tablet by mouth daily.  5   No current facility-administered medications for this visit.      REVIEW OF SYSTEMS: See HPI for pertinent positives and negatives.  Physical Examination Vitals:   01/28/18 0956  BP: 128/69  Pulse: (!) 56  Resp: 16  Temp: (!) 97 F (36.1 C)   TempSrc: Oral  SpO2: 99%  Weight: 157 lb (71.2 kg)  Height: 5\' 9"  (1.753 m)   Body mass index is 23.18 kg/m.  General:  WDWN male in NAD Gait: Normal HENT: WNL Eyes: PERRLA Pulmonary: normal non-labored breathing, good air movement, CTAB, no rales, rhonchi,  wheezing Cardiac: RRR, no murmur detected Abdomen: soft, no masses palpated Skin: no rashes, no ulcers, no cellulitis. Generalized mild pallor.  VASCULAR EXAM  Carotid Bruits Right Left   Negative Negative      Radial pulses are 2+ palpable bilaterally   Adominal aortic pulse is palpable AT LLQ, and mildly tender to palpation.  VASCULAR EXAM: Extremities without ischemic changes, without Gangrene; without open wounds.                                                                                                          LE Pulses Right Left       FEMORAL  2+ palpable  2+ palpable        POPLITEAL  2+ palpable   2+ palpable       POSTERIOR TIBIAL  not palpable   not palpable        DORSALIS PEDIS      ANTERIOR TIBIAL 2+ palpable  2+ palpable     Musculoskeletal: no muscle wasting or atrophy; no peripheral edema  Neurologic:  A&O X 3; appropriate affect, sensation is normal; speech is normal, CN 2-12 intact, pain and light touch intact in extremities, motor exam as listed above. Psychiatric: Normal thought content, mood appropriate to clinical situation.    ASSESSMENT:  LENDELL GALLICK is a 84 y.o. male whom we are monitoring for bilateral asymptomatic moderate carotid stenosis. He also has a known aortic anastomotic pseudoaneurysm s/p repair of the abdominal aortic aneurysm in 1999 at [facility].   DATA  Abdominal Aorta Duplex (01/28/18): The largest aortic measurement is 2.4 cm. Thrombosed aneurysm of the distal anastomosis measuring approximately 1.8 cm x 1.8 cm in diameter.  The largest aortic diameter remains essentially unchanged compared to prior exam.  Previous  diameter measurement was obtained on CT 07-13-17.     01-22-15 Carotid Duplex: 40-59% bilateral ICA stenosis. Right vertebral artery occlusion. Bilateral CCA disease.   CTA abd/pelvis (07-13-17): Aorta: There is graft involving the infrarenal abdominal aorta which remains patent. Again noted is a focal aneurysm along the anterior aspect of the distal aortic anastomosis. This aneurysm itself measures approximately 2.2 cm in the AP dimension and stable. Transverse dimension of this aneurysm is 2.4 cm and previously measured 2.3 cm. Overall size of the aorta at the level the aneurysm measures 4.3 cm and minimally changed. Again noted is mural thrombus within the anterior pseudoaneurysm. Proximal abdominal aorta remains patent.   PLAN:   Based on today's exam and non-invasive vascular lab results, and after discussing with Dr. Oneida Alar, the patient will follow up in 6 months with the following tests: AAA duplex and carotid duplex.  I discussed in depth with the patient the nature of atherosclerosis, and emphasized the importance of maximal medical management including strict control of blood pressure, blood glucose, and lipid levels, obtaining regular exercise, and cessation of smoking.  The patient is aware that without maximal medical management the underlying atherosclerotic disease process will progress, limiting the benefit of any interventions.  The patient was given information about stroke prevention and what symptoms should prompt the patient to seek immediate medical care.  The patient was given information about PAD including signs, symptoms, treatment, what symptoms should prompt the patient to seek immediate medical care, and risk reduction measures to take.  Thank you for allowing [place] to participate in this patient's care.  Vinnie Level Markas Aldredge, RN,  MSN, FNP-C [facility] Office: [phone]  Clinic MD: [facility] 01/28/2018 10:02 AM

## 2018-02-02 ENCOUNTER — Other Ambulatory Visit: Payer: Self-pay

## 2018-02-02 DIAGNOSIS — I714 Abdominal aortic aneurysm, without rupture, unspecified: Secondary | ICD-10-CM

## 2018-02-02 DIAGNOSIS — I6529 Occlusion and stenosis of unspecified carotid artery: Secondary | ICD-10-CM

## 2018-02-03 ENCOUNTER — Other Ambulatory Visit: Payer: Self-pay

## 2018-02-03 DIAGNOSIS — I714 Abdominal aortic aneurysm, without rupture, unspecified: Secondary | ICD-10-CM

## 2018-02-09 ENCOUNTER — Other Ambulatory Visit: Payer: Self-pay | Admitting: Internal Medicine

## 2018-02-09 ENCOUNTER — Ambulatory Visit
Admission: RE | Admit: 2018-02-09 | Discharge: 2018-02-09 | Disposition: A | Discharge status: Home / Self Care | Payer: Medicare Other | Source: Ambulatory Visit | Attending: Internal Medicine | Admitting: Internal Medicine

## 2018-02-09 DIAGNOSIS — I719 Aortic aneurysm of unspecified site, without rupture: Secondary | ICD-10-CM | POA: Insufficient documentation

## 2018-02-09 DIAGNOSIS — I714 Abdominal aortic aneurysm, without rupture, unspecified: Secondary | ICD-10-CM

## 2018-02-09 DIAGNOSIS — Z9889 Other specified postprocedural states: Secondary | ICD-10-CM | POA: Insufficient documentation

## 2018-02-09 DIAGNOSIS — R109 Unspecified abdominal pain: Secondary | ICD-10-CM | POA: Insufficient documentation

## 2018-02-09 DIAGNOSIS — R1033 Periumbilical pain: Secondary | ICD-10-CM

## 2018-02-09 LAB — POCT I-STAT CREATININE: Creatinine, Ser: 1.2 mg/dL (ref 0.61–1.24)

## 2018-02-09 MED ORDER — IOPAMIDOL (ISOVUE-300) INJECTION 61%
100.0000 mL | Freq: Once | INTRAVENOUS | Status: AC | PRN
  Administered 2018-02-09: 100 mL via INTRAVENOUS

## 2018-02-19 ENCOUNTER — Telehealth: Payer: Self-pay | Admitting: *Deleted

## 2018-02-19 NOTE — Telephone Encounter (Signed)
Patient called reporting that he was having mild chest pain and went to see Dr Nehemiah Massed who diagnosed him with mild angina. Did not do Cardiac Cath testing due to low platelet count, but did start him o medicine. Patient asking if he can take 81 mg ASA and it not bother his Platelet count

## 2018-02-19 NOTE — Telephone Encounter (Signed)
Patient an increased risk of bleeding taking 81 mg aspirin with a platelet count of 44.  Having said this, the cardiac benefit likely outweighs the risk.  Proceed with caution.

## 2018-02-19 NOTE — Telephone Encounter (Signed)
Advised patient of doctor response and that if he has any bleeding from gums nose, urine stools or excessive bruising to go to ER. Patient repeated back to me

## 2018-02-19 NOTE — Telephone Encounter (Signed)
Patient's son, [name], called with concerns that Dr. Cammie Sickle and [facility], had his dad scheduled for a left heart cath on 02-24-18. The patient has myelodysplastic syndrome with severe thrombocytopenia, his last Platelet count was 44 on 12-29-17, and he sees Dr. Grayland Ormond at [facility]. Dr. Oneida Alar has been seeing him for an aortic pseudoaneurysm but has put off doing a surgical repair d/t his pancytopenia. Patient's family is concerned about [name] bleeding excessively.   I called Dr. Michel Bickers office and spoke to his nurse, Baker Janus, about their concerns. She spoke to him and they have cancelled the heart catheterization at this time.   I called Bill back and relayed to him the Left heart catheterization has been cancelled. He will inform his father of this.

## 2018-02-24 ENCOUNTER — Ambulatory Visit: Admit: 2018-02-24 | Payer: Medicare Other | Admitting: Internal Medicine

## 2018-02-24 SURGERY — LEFT HEART CATH AND CORONARY ANGIOGRAPHY
Anesthesia: Moderate Sedation | Laterality: Left

## 2018-02-26 ENCOUNTER — Encounter: Payer: Self-pay | Admitting: Interventional Cardiology

## 2018-02-26 ENCOUNTER — Telehealth: Payer: Self-pay | Admitting: *Deleted

## 2018-02-26 NOTE — Telephone Encounter (Signed)
REFERRAL SENT TO SCHEDULING FROM DR. Laural Roes [phone]., NOTES IN [facility].

## 2018-03-01 ENCOUNTER — Encounter: Payer: Self-pay | Admitting: Interventional Cardiology

## 2018-03-01 ENCOUNTER — Ambulatory Visit (INDEPENDENT_AMBULATORY_CARE_PROVIDER_SITE_OTHER): Payer: Medicare Other | Admitting: Interventional Cardiology

## 2018-03-01 VITALS — BP 134/70 | HR 65 | Ht 69.0 in | Wt 158.4 lb

## 2018-03-01 DIAGNOSIS — I10 Essential (primary) hypertension: Secondary | ICD-10-CM

## 2018-03-01 DIAGNOSIS — I2510 Atherosclerotic heart disease of native coronary artery without angina pectoris: Secondary | ICD-10-CM | POA: Diagnosis not present

## 2018-03-01 DIAGNOSIS — D696 Thrombocytopenia, unspecified: Secondary | ICD-10-CM

## 2018-03-01 DIAGNOSIS — I714 Abdominal aortic aneurysm, without rupture, unspecified: Secondary | ICD-10-CM

## 2018-03-01 DIAGNOSIS — E782 Mixed hyperlipidemia: Secondary | ICD-10-CM

## 2018-03-01 MED ORDER — ISOSORBIDE MONONITRATE ER 60 MG PO TB24: 60.0000 mg | ORAL_TABLET | Freq: Every day | ORAL | 3 refills | Status: DC

## 2018-03-01 NOTE — Patient Instructions (Addendum)
Medication Instructions:  Your physician has recommended you make the following change in your medication:   INCREASE: isosorbide mononitrate (imdur) to 60 mg daily  Labwork: None ordered  Testing/Procedures: None ordered  Follow-Up: We will contact your regarding any follow-up after speaking to your hematologist  Any Other Special Instructions Will Be Listed Below (If Applicable).     If you need a refill on your cardiac medications before your next appointment, please call your pharmacy.

## 2018-03-01 NOTE — Progress Notes (Signed)
Cardiology Office Note   Date:  03/01/2018   ID:  [name], DOB May 17, 1926, MRN [id number]  PCP:  [name], MD    No chief complaint on file.  angina  Wt Readings from Last 3 Encounters:  03/01/18 158 lb 6.4 oz (71.8 kg)  01/28/18 157 lb (71.2 kg)  12/29/17 158 lb 14.4 oz (72.1 kg)       History of Present Illness: [name] is a 84 y.o. male who is being seen today for the evaluation of chest at the request of [name], MD.  [name] is a 84 y.o. male  Who has had chest discomfort with exertion.   Participates in the "senior games" in [place].    Two weeks ago, he developed some chest discomfort on the treadmill.  He saw his cardiologist and had an ECG that was unchanged per his report.  He was prescribed a long acting nitrate and a heart cath was recommended.  The two sons were concerned about the cath, due to the presence of an AAA, managed by Dr. Oneida Alar.  He also has low platelets.    Dr. Oneida Alar has held off on AAA repair, in part due to the low platelets, per the son who is here.    If cath is needed, he wants it done at [facility] and therefore, has come to be seen at our office.    Since starting the Imdur, he had another episode of chest pain after doing the bike for 4 minutes, after doing the treadmill for 30 minutes ( without  Problems).  Since that time, he has backed off of exercise.  He has not had any discomfort at rest.  He has not tried any nitroglycerin under his tongue.  He states that he had a cardiac cath several years ago and no intervention was required.    Past Medical History:  Diagnosis Date  . AAA (abdominal aortic aneurysm) ([place]) 1999  . Adenomatous colon polyp   . Aorta aneurysm ([place])   . Aortic aneurysm ([place]) 1999   repair  . Arthritis   . Basal cell cancer Dec. 24, 2013   Nose  . CAD (coronary artery disease)   . Cancer [facility]) Aug. 2013   prostate Seeding  implant  . Carotid artery occlusion   .  Enlarged prostate 2008   Thermotherapy  . Gastroesophageal reflux disease   . Hyperlipidemia   . Hypertension   . Pancytopenia ([place])    sees Dr. Grayland Ormond  . Peripheral vascular disease ([place])   . Psoriasis     Past Surgical History:  Procedure Laterality Date  . ABDOMINAL AORTIC ANEURYSM REPAIR  1999  . CARDIAC CATHETERIZATION  10/2009  . CHOLECYSTECTOMY    . ESOPHAGOGASTRODUODENOSCOPY (EGD) WITH PROPOFOL N/A 06/26/2016   Procedure: ESOPHAGOGASTRODUODENOSCOPY (EGD) WITH PROPOFOL;  Surgeon: Lollie Sails, MD;  Location[facility] ENDOSCOPY;  Service: Endoscopy;  Laterality: N/A;  . PROSTATE SURGERY    . SPINE SURGERY  2009   ruptured disc  . Visceral angiogram  05/14/2012   Right  groin area  . VISCERAL ANGIOGRAM N/A 05/14/2012   Procedure: VISCERAL ANGIOGRAM;  Surgeon: Elam Dutch, MD;  Location: [facility] CATH LAB;  Service: Cardiovascular;  Laterality: N/A;     Current Outpatient Medications  Medication Sig Dispense Refill  . felodipine (PLENDIL) 10 MG 24 hr tablet Take 10 mg by mouth daily.    Marland Kitchen glucosamine-chondroitin 500-400 MG tablet Take 1 tablet by mouth daily.     Marland Kitchen  isosorbide mononitrate (IMDUR) 30 MG 24 hr tablet Take 30 mg by mouth daily.    Marland Kitchen MAGNESIUM CITRATE PO Take 400 mg by mouth daily.    . Magnesium Citrate POWD Take 400 mg by mouth daily.    . Multiple Vitamin (MULTIVITAMIN) tablet Take 1 tablet by mouth daily.    . nitroGLYCERIN (NITROSTAT) 0.4 MG SL tablet Place 0.4 mg under the tongue as needed for chest pain. Place 1 tablet (0.4 mg) under the tongue every five minutes as need for chest pain    . oxyCODONE-acetaminophen (PERCOCET/ROXICET) 5-325 MG tablet Take 1-2 tablets by mouth 3 (three) times daily as needed for severe pain.     . polyethylene glycol (MIRALAX / GLYCOLAX) packet Take 17 g by mouth daily. 14 each 0  . rosuvastatin (CRESTOR) 20 MG tablet Take 20 mg by mouth daily.    . sucralfate (CARAFATE) 1 GM/10ML suspension Take 3 g by mouth daily.    .  valsartan-hydrochlorothiazide (DIOVAN-HCT) 320-25 MG per tablet Take 1 tablet by mouth daily.  5   No current facility-administered medications for this visit.     Allergies:   Ciprofloxacin    Social History:  The patient  reports that he quit smoking about 20 years ago. His smoking use included cigarettes. he has never used smokeless tobacco. He reports that he does not drink alcohol or use drugs.   Family History:  The patient's family history includes COPD in his father; Cancer in his sister; Heart attack in his brother and father; Heart disease in his brother and father.    ROS:  Please see the history of present illness.   Otherwise, review of systems are positive for chest discomfort.   All other systems are reviewed and negative.    PHYSICAL EXAM: VS:  BP 134/70 (BP Location: Right Arm, Patient Position: Sitting, Cuff Size: Normal)   Pulse 65   Ht 5\' 9"  (1.753 m)   Wt 158 lb 6.4 oz (71.8 kg)   SpO2 98%   BMI 23.39 kg/m  , BMI Body mass index is 23.39 kg/m. GEN: Well nourished, well developed, in no acute distress  HEENT: normal  Neck: no JVD, carotid bruits, or masses Cardiac: RRR; no murmurs, rubs, or gallops,no edema  Respiratory:  clear to auscultation bilaterally, normal work of breathing GI: soft, nontender, nondistended, + BS MS: no deformity or atrophy  Skin: warm and dry, no rash Neuro:  Strength and sensation are intact Psych: euthymic mood, full affect   EKG:   The ekg ordered today demonstrates normal sinus rhythm, right bundle branch block, left anterior fascicular block, bifascicular block   Recent Labs: 12/29/2017: Hemoglobin 11.6; Platelets 44 02/09/2018: Creatinine, Ser 1.20   Lipid Panel No results found for: CHOL, TRIG, HDL, CHOLHDL, VLDL, LDLCALC, LDLDIRECT   Other studies Reviewed: Additional studies/ records that were reviewed today with results demonstrating: Labs reviewed. Negative stress test in 2016.   He is listed as having " signficant  three vessel CAD by cath 2010."  ASSESSMENT AND PLAN:  1. CAD with Angina pectoris: Symptoms concerning for cardiac origin.  It appears he was hoping that a stress test may be sufficient.  I explained the rationale behind all of our testing modalities.  Given his age and high pretest probability for coronary artery disease, I am not sure that a stress test would be useful.  If the stress test result came back normal, and he continued to have symptoms, I would not believe the stress  test result.  The patient and family were understanding of this notion.  A cardiac cath would give [place] definitive results, but is associated with risks including bleeding, renal failure, stroke, need for emergent surgery.  Given his low platelet count, he would be at higher risk of bleeding.  The more important issue may be whether or not he is even a candidate for antiplatelet therapy.  I explained to them that he could have multivessel disease.  I do not think he would be an ideal surgical candidate given the thrombocytopenia and his age.  If he needed percutaneous revascularization, he would need to take oral antiplatelet therapy.  Thus far, he has been told to avoid antiplatelet agents including aspirin due to his thrombocytopenia.  If the risk of taking antiplatelet therapy is too high, it does not make sense to pursue angiography.  For now, will plan for medical therapy.  Increase Imdur to 60 mg daily.  Hopefully, this will control his symptoms and he can get back to a more active lifestyle. 2. Hyperlipidemia: Continue rosuvastatin. 3. Hypertension: Continue antihypertensives.  Blood pressure well controlled. 4. In regards to his participation in the senior games, I cannot recommend that he participates until his coronary status is clear.  However I explained to them that the risks involved with a cardiac cath may be prohibitive, particularly if he is not someone who would tolerate Plavix post PCI.  His best option may be  medical therapy to relieve symptoms.  I counseled him also against any heavy lifting due to his aortic aneurysm and issues with degeneration of a prosthetic material per his report. 5. I would like to hear the opinion of his hematologist, Dr. Grayland Ormond, regarding his eligibility for antiplatelet therapy.  Also , we need to see the results of his 2010 cath.  If there was already significant CAD present and CABG would be the recommendation, this may affect how aggressive we are with invasive testing.   Current medicines are reviewed at length with the patient today.  The patient concerns regarding his medicines were addressed.  The following changes have been made: Increase isosorbide to 60 mg daily  Labs/ tests ordered today include:  No orders of the defined types were placed in this encounter.   Recommend 150 minutes/week of aerobic exercise Low fat, low carb, high fiber diet recommended  Disposition:   FU in 2 months   Signed, Larae Grooms, MD  03/01/2018 3:31 PM    [facility] [place], [place],   [address] Phone: [phone]; Fax: [phone]

## 2018-03-01 NOTE — H&P (View-Only) (Signed)
Cardiology Office Note   Date:  03/01/2018   ID:  [name], DOB 05/10/1926, MRN [id number]  PCP:  [name], MD    No chief complaint on file.  angina  Wt Readings from Last 3 Encounters:  03/01/18 158 lb 6.4 oz (71.8 kg)  01/28/18 157 lb (71.2 kg)  12/29/17 158 lb 14.4 oz (72.1 kg)       History of Present Illness: [name] is a 84 y.o. male who is being seen today for the evaluation of chest at the request of [name], MD.  [name] is a 84 y.o. male  Who has had chest discomfort with exertion.   Participates in the "senior games" in [place].    Two weeks ago, he developed some chest discomfort on the treadmill.  He saw his cardiologist and had an ECG that was unchanged per his report.  He was prescribed a long acting nitrate and a heart cath was recommended.  The two sons were concerned about the cath, due to the presence of an AAA, managed by Dr. Oneida Alar.  He also has low platelets.    Dr. Oneida Alar has held off on AAA repair, in part due to the low platelets, per the son who is here.    If cath is needed, he wants it done at [facility] and therefore, has come to be seen at our office.    Since starting the Imdur, he had another episode of chest pain after doing the bike for 4 minutes, after doing the treadmill for 30 minutes ( without  Problems).  Since that time, he has backed off of exercise.  He has not had any discomfort at rest.  He has not tried any nitroglycerin under his tongue.  He states that he had a cardiac cath several years ago and no intervention was required.    Past Medical History:  Diagnosis Date  . AAA (abdominal aortic aneurysm) ([place]) 1999  . Adenomatous colon polyp   . Aorta aneurysm ([place])   . Aortic aneurysm ([place]) 1999   repair  . Arthritis   . Basal cell cancer Dec. 24, 2013   Nose  . CAD (coronary artery disease)   . Cancer [facility]) Aug. 2013   prostate Seeding  implant  . Carotid artery occlusion   .  Enlarged prostate 2008   Thermotherapy  . Gastroesophageal reflux disease   . Hyperlipidemia   . Hypertension   . Pancytopenia ([place])    sees Dr. Grayland Ormond  . Peripheral vascular disease ([place])   . Psoriasis     Past Surgical History:  Procedure Laterality Date  . ABDOMINAL AORTIC ANEURYSM REPAIR  1999  . CARDIAC CATHETERIZATION  10/2009  . CHOLECYSTECTOMY    . ESOPHAGOGASTRODUODENOSCOPY (EGD) WITH PROPOFOL N/A 06/26/2016   Procedure: ESOPHAGOGASTRODUODENOSCOPY (EGD) WITH PROPOFOL;  Surgeon: Lollie Sails, MD;  Location: [facility] ENDOSCOPY;  Service: Endoscopy;  Laterality: N/A;  . PROSTATE SURGERY    . SPINE SURGERY  2009   ruptured disc  . Visceral angiogram  05/14/2012   Right  groin area  . VISCERAL ANGIOGRAM N/A 05/14/2012   Procedure: VISCERAL ANGIOGRAM;  Surgeon: Elam Dutch, MD;  Location: [facility] CATH LAB;  Service: Cardiovascular;  Laterality: N/A;     Current Outpatient Medications  Medication Sig Dispense Refill  . felodipine (PLENDIL) 10 MG 24 hr tablet Take 10 mg by mouth daily.    Marland Kitchen glucosamine-chondroitin 500-400 MG tablet Take 1 tablet by mouth daily.     Marland Kitchen  isosorbide mononitrate (IMDUR) 30 MG 24 hr tablet Take 30 mg by mouth daily.    Marland Kitchen MAGNESIUM CITRATE PO Take 400 mg by mouth daily.    . Magnesium Citrate POWD Take 400 mg by mouth daily.    . Multiple Vitamin (MULTIVITAMIN) tablet Take 1 tablet by mouth daily.    . nitroGLYCERIN (NITROSTAT) 0.4 MG SL tablet Place 0.4 mg under the tongue as needed for chest pain. Place 1 tablet (0.4 mg) under the tongue every five minutes as need for chest pain    . oxyCODONE-acetaminophen (PERCOCET/ROXICET) 5-325 MG tablet Take 1-2 tablets by mouth 3 (three) times daily as needed for severe pain.     . polyethylene glycol (MIRALAX / GLYCOLAX) packet Take 17 g by mouth daily. 14 each 0  . rosuvastatin (CRESTOR) 20 MG tablet Take 20 mg by mouth daily.    . sucralfate (CARAFATE) 1 GM/10ML suspension Take 3 g by mouth daily.    .  valsartan-hydrochlorothiazide (DIOVAN-HCT) 320-25 MG per tablet Take 1 tablet by mouth daily.  5   No current facility-administered medications for this visit.     Allergies:   Ciprofloxacin    Social History:  The patient  reports that he quit smoking about 20 years ago. His smoking use included cigarettes. he has never used smokeless tobacco. He reports that he does not drink alcohol or use drugs.   Family History:  The patient's family history includes COPD in his father; Cancer in his sister; Heart attack in his brother and father; Heart disease in his brother and father.    ROS:  Please see the history of present illness.   Otherwise, review of systems are positive for chest discomfort.   All other systems are reviewed and negative.    PHYSICAL EXAM: VS:  BP 134/70 (BP Location: Right Arm, Patient Position: Sitting, Cuff Size: Normal)   Pulse 65   Ht 5\' 9"  (1.753 m)   Wt 158 lb 6.4 oz (71.8 kg)   SpO2 98%   BMI 23.39 kg/m  , BMI Body mass index is 23.39 kg/m. GEN: Well nourished, well developed, in no acute distress  HEENT: normal  Neck: no JVD, carotid bruits, or masses Cardiac: RRR; no murmurs, rubs, or gallops,no edema  Respiratory:  clear to auscultation bilaterally, normal work of breathing GI: soft, nontender, nondistended, + BS MS: no deformity or atrophy  Skin: warm and dry, no rash Neuro:  Strength and sensation are intact Psych: euthymic mood, full affect   EKG:   The ekg ordered today demonstrates normal sinus rhythm, right bundle branch block, left anterior fascicular block, bifascicular block   Recent Labs: 12/29/2017: Hemoglobin 11.6; Platelets 44 02/09/2018: Creatinine, Ser 1.20   Lipid Panel No results found for: CHOL, TRIG, HDL, CHOLHDL, VLDL, LDLCALC, LDLDIRECT   Other studies Reviewed: Additional studies/ records that were reviewed today with results demonstrating: Labs reviewed. Negative stress test in 2016.   He is listed as having " signficant  three vessel CAD by cath 2010."  ASSESSMENT AND PLAN:  1. CAD with Angina pectoris: Symptoms concerning for cardiac origin.  It appears he was hoping that a stress test may be sufficient.  I explained the rationale behind all of our testing modalities.  Given his age and high pretest probability for coronary artery disease, I am not sure that a stress test would be useful.  If the stress test result came back normal, and he continued to have symptoms, I would not believe the stress  test result.  The patient and family were understanding of this notion.  A cardiac cath would give [place] definitive results, but is associated with risks including bleeding, renal failure, stroke, need for emergent surgery.  Given his low platelet count, he would be at higher risk of bleeding.  The more important issue may be whether or not he is even a candidate for antiplatelet therapy.  I explained to them that he could have multivessel disease.  I do not think he would be an ideal surgical candidate given the thrombocytopenia and his age.  If he needed percutaneous revascularization, he would need to take oral antiplatelet therapy.  Thus far, he has been told to avoid antiplatelet agents including aspirin due to his thrombocytopenia.  If the risk of taking antiplatelet therapy is too high, it does not make sense to pursue angiography.  For now, will plan for medical therapy.  Increase Imdur to 60 mg daily.  Hopefully, this will control his symptoms and he can get back to a more active lifestyle. 2. Hyperlipidemia: Continue rosuvastatin. 3. Hypertension: Continue antihypertensives.  Blood pressure well controlled. 4. In regards to his participation in the senior games, I cannot recommend that he participates until his coronary status is clear.  However I explained to them that the risks involved with a cardiac cath may be prohibitive, particularly if he is not someone who would tolerate Plavix post PCI.  His best option may be  medical therapy to relieve symptoms.  I counseled him also against any heavy lifting due to his aortic aneurysm and issues with degeneration of a prosthetic material per his report. 5. I would like to hear the opinion of his hematologist, Dr. Grayland Ormond, regarding his eligibility for antiplatelet therapy.  Also , we need to see the results of his 2010 cath.  If there was already significant CAD present and CABG would be the recommendation, this may affect how aggressive we are with invasive testing.   Current medicines are reviewed at length with the patient today.  The patient concerns regarding his medicines were addressed.  The following changes have been made: Increase isosorbide to 60 mg daily  Labs/ tests ordered today include:  No orders of the defined types were placed in this encounter.   Recommend 150 minutes/week of aerobic exercise Low fat, low carb, high fiber diet recommended  Disposition:   FU in 2 months   Signed, Larae Grooms, MD  03/01/2018 3:31 PM    [facility] [place], [place], [place]  [address] Phone: [phone]; Fax: [phone]

## 2018-03-03 ENCOUNTER — Telehealth: Payer: Self-pay | Admitting: Interventional Cardiology

## 2018-03-03 NOTE — Telephone Encounter (Signed)
New Message  Pt c/o of Chest Pain: STAT if CP now or developed within 24 hours  1. Are you having CP right now? yes  2. Are you experiencing any other symptoms (ex. SOB, nausea, vomiting, sweating)? no  3. How long have you been experiencing CP? All day  4. Is your CP continuous or coming and going?  continuos  5. Have you taken Nitroglycerin? Yes  Ask if he would be willing to speak with a triage nurse but pt refused and only wants to speak with [place] C.

## 2018-03-03 NOTE — Telephone Encounter (Signed)
Spoke with patient with his wife in the background. He wanted to speak with [place] and I told him she was out for the day. Pt c/o continuous chest pain today, despite increase in Imdur and using nitro tabs. He denied SOB or weakness. He stated his pain was mid chest, substernal with no arm radiation. I voiced my concern to Mr Garriga in that I believed he should go to the ED given his continued CP despite Imdur and Nitro use. He said he did not want to go and he didn't have a ride. I offered him an EMT transport and I would call ED Triage RN to let them know he was coming. He still declined.  Pt stated he wanted to call his son and would call me back.  Awaiting return call.

## 2018-03-03 NOTE — Telephone Encounter (Signed)
Pt's son called to clarify the conversation I had with his dad. I told his son my concern of CP unrelieved by medication which has been continuing all day. His son said he believed it was GI related but wasn't sure. I told him I could not comment on that opinion since I am not there to assess his dad personally. He asked if he should bring his dad to the hospital. I replied that it is my recommendation that he go, but at the lease have someone with his dad this evening who can call 911 if he has any changes. He agreed and verbalized understanding.

## 2018-03-04 NOTE — Telephone Encounter (Signed)
Called to follow up on patient regarding phone call yesterday. Patient states that yesterday he had an episode of chest discomfort in the center of his chest that he rates 3/10. He states that it was a constant nagging pain that lasted from 10 AM until the late afternoon. Patient states that he was sitting down when it started and that he was not exerting himself at the time and that it just eased off on its own. Patient denies radiation, SOB, lightheadedness, dizziness, N/V, sweating, or any other symptoms. Patient denies NTG use. Patient states that he has never used NTG before, that he thought that the nurse from yesterday was talking about Imdur. Patient states that he has not had any discomfort today. Patient was seen in the office on 3/11 and his Imdur was increased to 60 mg QD. Patient states that he is feeling fine today and that his BP is 111/66 HR 69. Made patient aware that Dr. Irish Lack spoke to Dr. Grayland Ormond regarding whether or not to have a cath. Made patient aware that they both feel that the safest strategy for now is to try to manage his symptoms medically, and avoid having to commit him to antiplatelet therapy given his history of thrombocytopenia. Made patient aware that if we can control his symptoms, then he can gradually increase activity. Advised patient to continue to take his imdur 30 mg QD. Educated the patient on SL NTG use since he has never used it before. ER precautions reviewed with the patient. Instructed patient to let us know if his symptoms change or worsen. Patient verbalized understanding and was in agreement with this plan. Follow-up appointment scheduled for  4/16 at 3:00 PM.

## 2018-03-10 ENCOUNTER — Telehealth: Payer: Self-pay | Admitting: Interventional Cardiology

## 2018-03-10 ENCOUNTER — Encounter: Payer: Self-pay | Admitting: Interventional Cardiology

## 2018-03-10 DIAGNOSIS — I25118 Atherosclerotic heart disease of native coronary artery with other forms of angina pectoris: Secondary | ICD-10-CM

## 2018-03-10 NOTE — Telephone Encounter (Signed)
Returned call to patient. Patient states that he has continued to have chest pain. Patient states that he has constant nagging pain all of the time located in the center of his chest that he rates about a 3/10. Patient states that he is fine at rest but on exertion (ie. walking on the treadmill on a slow speed) he develops severe chest pain in the center of his chest that he rates 9/10. Patient denies radiation, SOB, lightheadedness, dizziness, N/V, sweating, or any other symptoms. Patient states that he tried using the SL NTG during his episodes with severe pain and states that he will never take it again because he almost passed out. He states that he has continued to take his imdur 60 mg QD. Patient states that he does not want to live like this and that he has always been an active person. Patient states that he wants to proceed with having a cath done. He states that he does not feel like medicine is going to work. Patient is asking if a heart cath has been taken off the table altogether. Made patient aware that given his thrombocytopenia and the fact that we would like to avoid having to commit him to antiplatelet therapy Dr. Irish Lack and Dr. Grayland Ormond both feel the safest strategy is to try and manage his symptoms with medication. Patient states that he has discussed this with his family and he would like to proceed with having a cath. Reviewed the risks associated with proceeding with a cath that Sylvester discussed at [place] which include but are not limited to: bleeding, renal failure, stroke, and the possible need for emergent surgery. Made patient aware that given his low platelet count he is at a higher risk for bleeding. I asked patient if he would be willing to try and make changes to his medication regimen. Patient states that he would be willing to try but he did not think that it was the answer. Patient states that he thinks that having a cath is the answer, but would like to discuss with Dr. Irish Lack further  regarding risks, benefits, and prognosis. Made pateint aware that I would forward to Noel for review and recommendation. ER precautions reviewed with the patient. Patient states that he refuses to take NTG anymore and he does not want to go to [facility]

## 2018-03-10 NOTE — Telephone Encounter (Signed)
New Message   Pt states he has questions but does not want to go into detail. Please call

## 2018-03-11 NOTE — Telephone Encounter (Signed)
We can certainly set up diagnostic cath, preferably from radial approach and see what is the situation with his coronaries.  If intervention needed, this can be staged and we can then weight the risks and benefits and issues with thrombocytopenia with Dr. Grayland Ormond.  I am ok to do a diagnostic cath.  Will include Dr. Grayland Ormond in the discussion regarding safety of using aspirin and/or clopidogrel for at least 1-3 months.

## 2018-03-11 NOTE — Telephone Encounter (Signed)
It sounds like the cardiac benefit to ASA and plavix outweighs the risk of increased bleeding. Ok to proceed cautiously with medical management of his cardiac disease.

## 2018-03-11 NOTE — Telephone Encounter (Signed)
Called patient and made him aware that Dr. Irish Lack is okay with proceeding with scheduling a diagnostic cath to evaluate his coronary arteries. Made patient aware that if intervention needed, this could be staged. Made patient aware that Dr. Irish Lack will review his pre-cath labs as well as the safety of using aspirin and/or clopidogrel for at least 1-3 months if intervention is required with Dr. Grayland Ormond. Patient verbalized understanding and wishes to schedule cath. Patient was scheduled for cath on 03/15/18 with Dr. Irish Lack at 9:00 AM. Patient understands that he will need to arrive at 7:00 AM. Patient will come in for pre-cath labs to be checked tomorrow. Labs ordered and appointment made. Reviewed pre-cath instructions with the patient. Instruction letter will be left up front for the patient to pick up when he comes in for labs. Patient verbalized understanding and thanked me for the call.    [facility] [address], [place] [address] Dept: [phone] Loc: [place]  03/11/2018  You are scheduled for a Cardiac Catheterization on Monday, March 25 with Dr. Larae Grooms.  1. Please arrive at the [facility] (Main Entrance A) at [facility]: [place],  [id number] at 7:00 AM (two hours before your procedure to ensure your preparation). Free valet parking service is available.   Special note: Every effort is made to have your procedure done on time. Please understand that emergencies sometimes delay scheduled procedures.  2. Diet: Do not eat or drink anything after midnight prior to your procedure except sips of water to take medications.  3. Labs: LABS 3/22: CBC, BMET, PT/INR  4. Medication instructions in preparation for your procedure:  On the morning of your procedure, you may take all of your morning medicines with a sip of water,  EXCEPT FOR:  DO NOT TAKE valsartan-hydrochlorothiazide the morning of your procedure.    PATIENT IS NOT TO TAKE ASPIRIN PRIOR TO PROCEDURE, PER DR. VARANASI.  5. Plan for one night stay--bring personal belongings. 6. Bring a current list of your medications and current insurance cards. 7. You MUST have a responsible person to drive you home. 8. Someone MUST be with you the first 24 hours after you arrive home or your discharge will be delayed. 9. Please wear clothes that are easy to get on and off and wear slip-on shoes.  Thank you for allowing [place] to care for you!   -- [facility]

## 2018-03-12 ENCOUNTER — Other Ambulatory Visit: Payer: Medicare Other

## 2018-03-12 DIAGNOSIS — I25118 Atherosclerotic heart disease of native coronary artery with other forms of angina pectoris: Secondary | ICD-10-CM

## 2018-03-13 LAB — CBC WITH DIFFERENTIAL/PLATELET
Basophils Absolute: 0 10*3/uL (ref 0.0–0.2)
Basos: 0 %
EOS (ABSOLUTE): 0 10*3/uL (ref 0.0–0.4)
Eos: 1 %
Hematocrit: 33.7 % — ABNORMAL LOW (ref 37.5–51.0)
Hemoglobin: 11.3 g/dL — ABNORMAL LOW (ref 13.0–17.7)
Immature Grans (Abs): 0 10*3/uL (ref 0.0–0.1)
Immature Granulocytes: 0 %
Lymphocytes Absolute: 1 10*3/uL (ref 0.7–3.1)
Lymphs: 27 %
MCH: 28.3 pg (ref 26.6–33.0)
MCHC: 33.5 g/dL (ref 31.5–35.7)
MCV: 85 fL (ref 79–97)
Monocytes Absolute: 0.6 10*3/uL (ref 0.1–0.9)
Monocytes: 16 %
Neutrophils Absolute: 2.2 10*3/uL (ref 1.4–7.0)
Neutrophils: 56 %
Platelets: 76 10*3/uL — CL (ref 150–379)
RBC: 3.99 x10E6/uL — ABNORMAL LOW (ref 4.14–5.80)
RDW: 13.8 % (ref 12.3–15.4)
WBC: 3.8 10*3/uL (ref 3.4–10.8)

## 2018-03-13 LAB — BASIC METABOLIC PANEL
BUN/Creatinine Ratio: 16 (ref 10–24)
BUN: 17 mg/dL (ref 8–27)
CO2: 25 mmol/L (ref 20–29)
Calcium: 9.3 mg/dL (ref 8.6–10.2)
Chloride: 95 mmol/L — ABNORMAL LOW (ref 96–106)
Creatinine, Ser: 1.04 mg/dL (ref 0.76–1.27)
GFR calc Af Amer: 73 mL/min/{1.73_m2} (ref >59)
GFR calc non Af Amer: 63 mL/min/{1.73_m2} (ref >59)
Glucose: 116 mg/dL — ABNORMAL HIGH (ref 65–99)
Potassium: 3.8 mmol/L (ref 3.5–5.2)
Sodium: 136 mmol/L (ref 134–144)

## 2018-03-13 LAB — PROTIME-INR
INR: 1.1 (ref 0.8–1.2)
Prothrombin Time: 11.2 s (ref 9.1–12.0)

## 2018-03-15 ENCOUNTER — Ambulatory Visit (HOSPITAL_COMMUNITY)
Admission: RE | Admit: 2018-03-15 | Discharge: 2018-03-15 | Disposition: A | Discharge status: Home / Self Care | Payer: Medicare Other | Source: Ambulatory Visit | Attending: Interventional Cardiology | Admitting: Interventional Cardiology

## 2018-03-15 ENCOUNTER — Ambulatory Visit (HOSPITAL_COMMUNITY)
Admission: RE | Disposition: A | Discharge status: Home / Self Care | Payer: Self-pay | Source: Ambulatory Visit | Attending: Interventional Cardiology

## 2018-03-15 DIAGNOSIS — Z881 Allergy status to other antibiotic agents status: Secondary | ICD-10-CM | POA: Diagnosis not present

## 2018-03-15 DIAGNOSIS — E785 Hyperlipidemia, unspecified: Secondary | ICD-10-CM | POA: Diagnosis not present

## 2018-03-15 DIAGNOSIS — I2584 Coronary atherosclerosis due to calcified coronary lesion: Secondary | ICD-10-CM | POA: Diagnosis not present

## 2018-03-15 DIAGNOSIS — I10 Essential (primary) hypertension: Secondary | ICD-10-CM | POA: Insufficient documentation

## 2018-03-15 DIAGNOSIS — I714 Abdominal aortic aneurysm, without rupture: Secondary | ICD-10-CM | POA: Insufficient documentation

## 2018-03-15 DIAGNOSIS — D696 Thrombocytopenia, unspecified: Secondary | ICD-10-CM | POA: Diagnosis not present

## 2018-03-15 DIAGNOSIS — M1990 Unspecified osteoarthritis, unspecified site: Secondary | ICD-10-CM | POA: Diagnosis not present

## 2018-03-15 DIAGNOSIS — Z8249 Family history of ischemic heart disease and other diseases of the circulatory system: Secondary | ICD-10-CM | POA: Diagnosis not present

## 2018-03-15 DIAGNOSIS — Z87891 Personal history of nicotine dependence: Secondary | ICD-10-CM | POA: Diagnosis not present

## 2018-03-15 DIAGNOSIS — I209 Angina pectoris, unspecified: Secondary | ICD-10-CM

## 2018-03-15 DIAGNOSIS — I25119 Atherosclerotic heart disease of native coronary artery with unspecified angina pectoris: Secondary | ICD-10-CM | POA: Diagnosis not present

## 2018-03-15 DIAGNOSIS — R0789 Other chest pain: Secondary | ICD-10-CM | POA: Diagnosis present

## 2018-03-15 HISTORY — PX: LEFT HEART CATH AND CORONARY ANGIOGRAPHY: CATH118249

## 2018-03-15 SURGERY — LEFT HEART CATH AND CORONARY ANGIOGRAPHY
Anesthesia: LOCAL

## 2018-03-15 MED ORDER — ONDANSETRON HCL 4 MG/2ML IJ SOLN: 4.0000 mg | Freq: Four times a day (QID) | INTRAMUSCULAR | Status: DC | PRN

## 2018-03-15 MED ORDER — SODIUM CHLORIDE 0.9% FLUSH: 3.0000 mL | INTRAVENOUS | Status: DC | PRN

## 2018-03-15 MED ORDER — SODIUM CHLORIDE 0.9 % IV SOLN: 250.0000 mL | INTRAVENOUS | Status: DC | PRN

## 2018-03-15 MED ORDER — SODIUM CHLORIDE 0.9 % WEIGHT BASED INFUSION
3.0000 mL/kg/h | INTRAVENOUS | Status: DC
  Administered 2018-03-15: 3 mL/kg/h via INTRAVENOUS

## 2018-03-15 MED ORDER — SODIUM CHLORIDE 0.9% FLUSH: 3.0000 mL | Freq: Two times a day (BID) | INTRAVENOUS | Status: DC

## 2018-03-15 MED ORDER — HEPARIN (PORCINE) IN NACL 2-0.9 UNIT/ML-% IJ SOLN
INTRAMUSCULAR | Status: DC | PRN
  Administered 2018-03-15 (×2): 500 mL

## 2018-03-15 MED ORDER — MIDAZOLAM HCL 2 MG/2ML IJ SOLN
INTRAMUSCULAR | Status: AC
  Filled 2018-03-15: qty 2

## 2018-03-15 MED ORDER — LIDOCAINE HCL (PF) 1 % IJ SOLN
INTRAMUSCULAR | Status: DC | PRN
  Administered 2018-03-15: 2 mL

## 2018-03-15 MED ORDER — HEPARIN (PORCINE) IN NACL 2-0.9 UNIT/ML-% IJ SOLN
INTRAMUSCULAR | Status: AC
  Filled 2018-03-15: qty 1000

## 2018-03-15 MED ORDER — HEPARIN SODIUM (PORCINE) 1000 UNIT/ML IJ SOLN
INTRAMUSCULAR | Status: DC | PRN
  Administered 2018-03-15: 3500 [IU] via INTRAVENOUS

## 2018-03-15 MED ORDER — IOPAMIDOL (ISOVUE-370) INJECTION 76%
INTRAVENOUS | Status: AC
  Filled 2018-03-15: qty 50

## 2018-03-15 MED ORDER — MIDAZOLAM HCL 2 MG/2ML IJ SOLN
INTRAMUSCULAR | Status: DC | PRN
  Administered 2018-03-15: 1 mg via INTRAVENOUS

## 2018-03-15 MED ORDER — HEPARIN SODIUM (PORCINE) 1000 UNIT/ML IJ SOLN
INTRAMUSCULAR | Status: AC
  Filled 2018-03-15: qty 1

## 2018-03-15 MED ORDER — VERAPAMIL HCL 2.5 MG/ML IV SOLN
INTRAVENOUS | Status: AC
  Filled 2018-03-15: qty 2

## 2018-03-15 MED ORDER — SODIUM CHLORIDE 0.9 % WEIGHT BASED INFUSION
1.0000 mL/kg/h | INTRAVENOUS | Status: DC
  Administered 2018-03-15: 250 mL via INTRAVENOUS

## 2018-03-15 MED ORDER — LIDOCAINE HCL (PF) 1 % IJ SOLN
INTRAMUSCULAR | Status: AC
  Filled 2018-03-15: qty 30

## 2018-03-15 MED ORDER — SODIUM CHLORIDE 0.9 % IV SOLN: INTRAVENOUS | Status: DC

## 2018-03-15 MED ORDER — ACETAMINOPHEN 325 MG PO TABS: 650.0000 mg | ORAL_TABLET | ORAL | Status: DC | PRN

## 2018-03-15 MED ORDER — VERAPAMIL HCL 2.5 MG/ML IV SOLN
INTRAVENOUS | Status: DC | PRN
  Administered 2018-03-15: 10 mL via INTRA_ARTERIAL

## 2018-03-15 MED ORDER — IOPAMIDOL (ISOVUE-370) INJECTION 76%
INTRAVENOUS | Status: AC
  Filled 2018-03-15: qty 100

## 2018-03-15 MED ORDER — IOPAMIDOL (ISOVUE-370) INJECTION 76%
INTRAVENOUS | Status: DC | PRN
  Administered 2018-03-15: 85 mL via INTRA_ARTERIAL

## 2018-03-15 SURGICAL SUPPLY — 12 items
CATH IMPULSE 5F ANG/FL3.5 (CATHETERS) ×2 IMPLANT
CATH INFINITI 5 FR 3DRC (CATHETERS) ×2 IMPLANT
CATH INFINITI 5FR AL1 (CATHETERS) ×2 IMPLANT
GUIDEWIRE INQWIRE 1.5J.035X260 (WIRE) ×1 IMPLANT
INQWIRE 1.5J .035X260CM (WIRE) ×2
KIT HEART LEFT (KITS) ×2 IMPLANT
NEEDLE PERC 21GX4CM (NEEDLE) ×2 IMPLANT
PACK CARDIAC CATHETERIZATION (CUSTOM PROCEDURE TRAY) ×2 IMPLANT
SHEATH RAIN RADIAL 21G 6FR (SHEATH) ×2 IMPLANT
TRANSDUCER W/STOPCOCK (MISCELLANEOUS) ×2 IMPLANT
TUBING CIL FLEX 10 FLL-RA (TUBING) ×2 IMPLANT
WIRE HI TORQ VERSACORE-J 145CM (WIRE) ×2 IMPLANT

## 2018-03-15 NOTE — Interval H&P Note (Signed)
Cath Lab Visit (complete for each Cath Lab visit)  Clinical Evaluation Leading to the Procedure:   ACS: No.  Non-ACS:    Anginal Classification: CCS III  Anti-ischemic medical therapy: Minimal Therapy (1 class of medications)  Non-Invasive Test Results: No non-invasive testing performed  Prior CABG: No previous CABG    Accelerating angina  History and Physical Interval Note:  03/15/2018 11:41 AM  Hale Drone  has presented today for surgery, with the diagnosis of cad with angina  The various methods of treatment have been discussed with the patient and family. After consideration of risks, benefits and other options for treatment, the patient has consented to  Procedure(s): LEFT HEART CATH AND CORONARY ANGIOGRAPHY (N/A) as a surgical intervention .  The patient's history has been reviewed, patient examined, no change in status, stable for surgery.  I have reviewed the patient's chart and labs.  Questions were answered to the patient's satisfaction.     Larae Grooms

## 2018-03-15 NOTE — Discharge Instructions (Signed)

## 2018-03-15 NOTE — Progress Notes (Signed)
Radial band repositioned  W/ 12cc air. Good pleth waveform. Good hemostasis

## 2018-03-15 NOTE — Progress Notes (Signed)
[facility] BAND REMOVAL  LOCATION:    right radial  DEFLATED PER PROTOCOL:    Yes.    TIME BAND OFF / DRESSING APPLIED:    1410   SITE UPON ARRIVAL:    Level 1  SITE AFTER BAND REMOVAL:    Level 1  CIRCULATION SENSATION AND MOVEMENT:    Within Normal Limits   Yes.    COMMENTS:   TRB removed/ site soft. Small bruise present,

## 2018-03-16 ENCOUNTER — Encounter (HOSPITAL_COMMUNITY): Payer: Self-pay | Admitting: Interventional Cardiology

## 2018-03-16 MED FILL — Heparin Sodium (Porcine) 2 Unit/ML in Sodium Chloride 0.9%: INTRAMUSCULAR | Qty: 1000 | Status: AC

## 2018-03-17 ENCOUNTER — Telehealth: Payer: Self-pay | Admitting: Interventional Cardiology

## 2018-03-17 NOTE — Telephone Encounter (Signed)
New Message   Patient is calling in reference to the heart cath that he had yesterday. He states that he was advised not to due anything strenous yesterday. But he did pick up a hand grip for strengthening. Then put it down. But he says the site of the cath is a bit puffy and he wants to know what he can do to elevate the puffiness. Please call to discuss.

## 2018-03-17 NOTE — Telephone Encounter (Signed)
Patient calling and states that he had a heart cath on 3/25 via right radial artery. Patient states that he removed his bandage last night. He states that he was sitting in his chair and out of habit he picked up the hand grip from his table that he uses for strengthening and started using it in the right hand. Patient states that he wanted to call and report it because he noticed a little more bruising or purplish color today near his insertion site. Patient denies numbness, tingling, bleeding, color or temperature changes between the right and left hand, or pain. Patient states that there is a very small amount of swelling near the insertion site. Patient states that this is smaller than the size of a pea. Made patient aware that some bruising is normal and that this should improve in time. Advised patient that he can elevate his arm and that he should avoid using the hand grip or lifting anything. Instructed patient to continue to monitor and let us know if his symptoms change or worsen. Patient verbalized understanding and thanked me for the call.   Reviewed with Dr. Irish Lack since patient has thrombocytopenia. Dr. Irish Lack is in agreement with this plan as well.

## 2018-03-20 IMAGING — CT CT CTA ABD/PEL W/CM AND/OR W/O CM
3 of 9 series · 10 of 46 positions shown, 16 images · IV contrast (iopamidol)
Comparison: 07/13/2017

CLINICAL DATA: Abdominal pain for several days

EXAM:
CTA ABDOMEN AND PELVIS WITH CONTRAST
TECHNIQUE: Multidetector CT imaging of the abdomen and pelvis was performed
using the standard protocol during bolus administration of
intravenous contrast. Multiplanar reconstructed images and MIPs were
obtained and reviewed to evaluate the vascular anatomy.
CONTRAST:  100mL [id number] IOPAMIDOL ([id number]) INJECTION 61%

[Series 4: axial arterial · axial · arterial · 0.71mm/px · z∈[-1123,-1013]mm · 3 of 247 slices shown]
[im 28/247  soft-tissue]
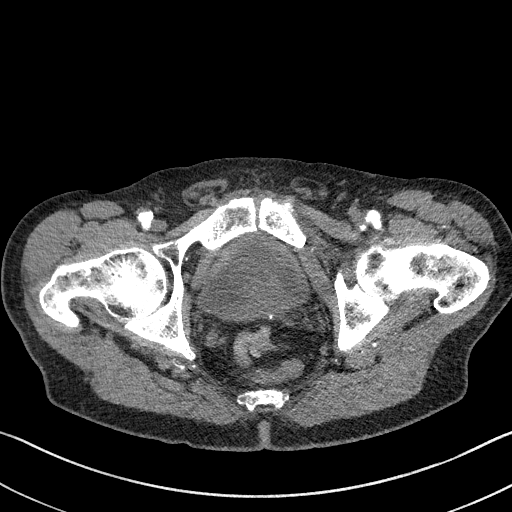
[im 55/247  soft-tissue]
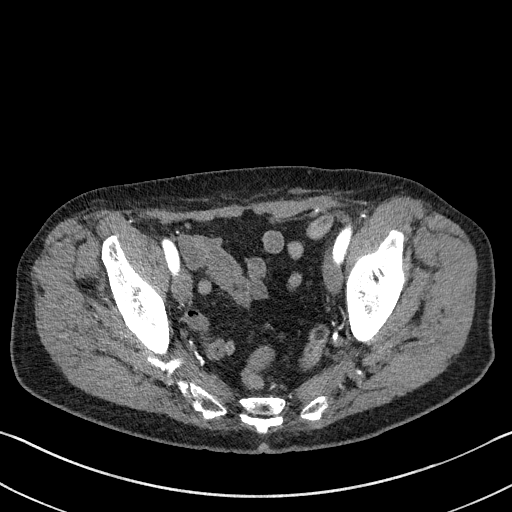
[im 83/247  soft-tissue]
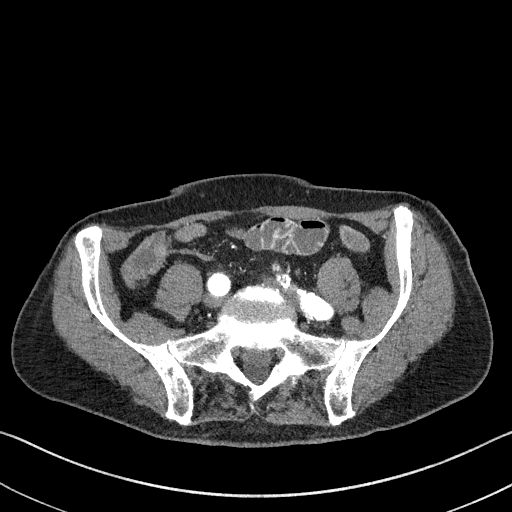

[Series 6: axial venous · axial · portal-venous · 0.71mm/px · z∈[-1094,-769]mm · 5 of 99 slices shown, 10 images]
[im 17/99  soft-tissue]
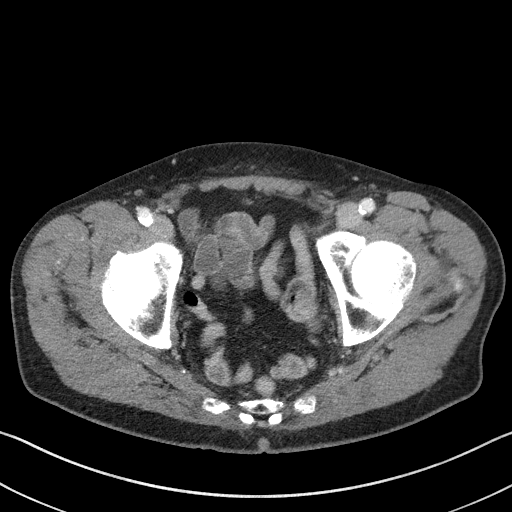
[im 17/99  bone]
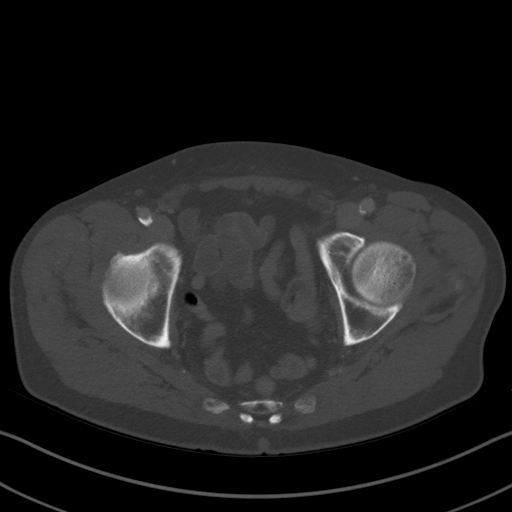
[im 33/99  soft-tissue]
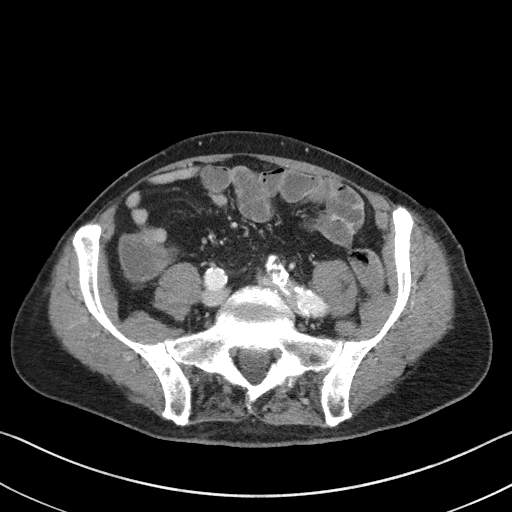
[im 33/99  lung]
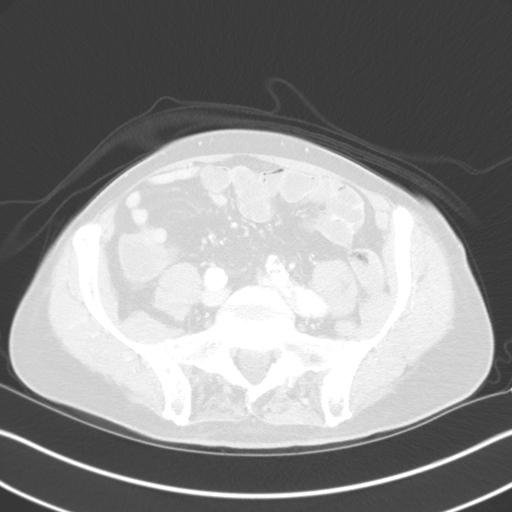
[im 50/99  soft-tissue]
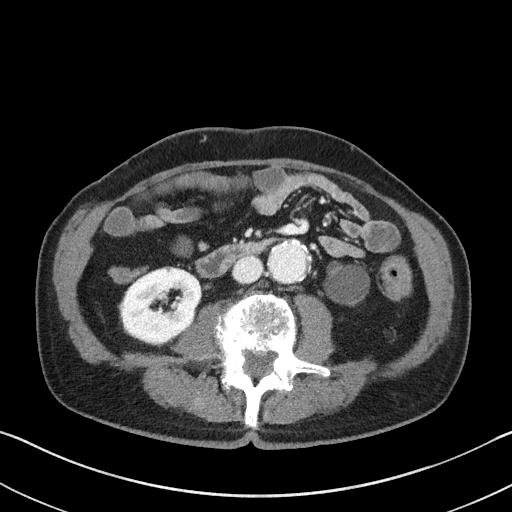
[im 50/99  lung]
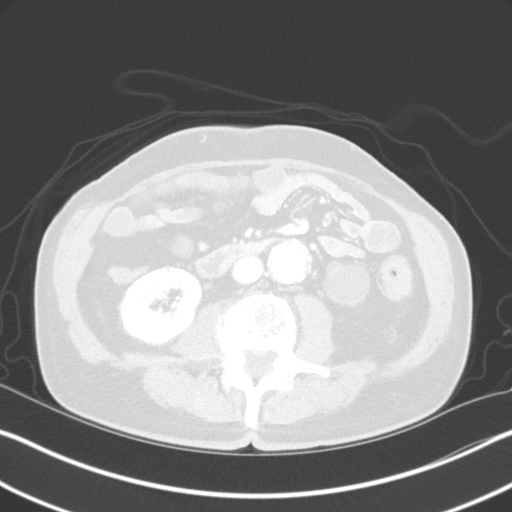
[im 66/99  soft-tissue]
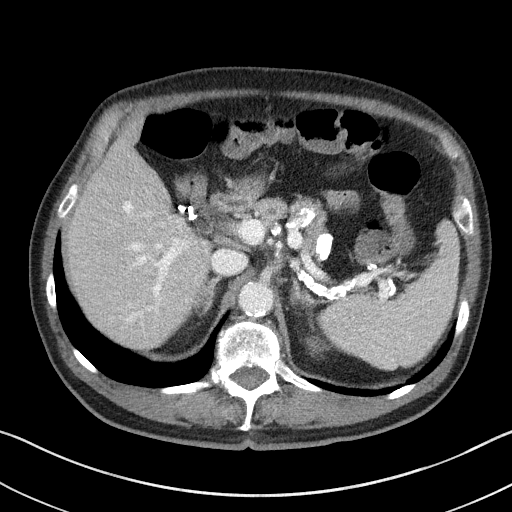
[im 66/99  lung]
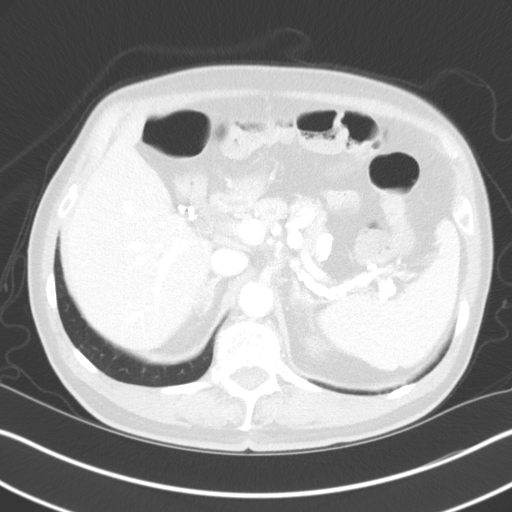
[im 82/99  soft-tissue]
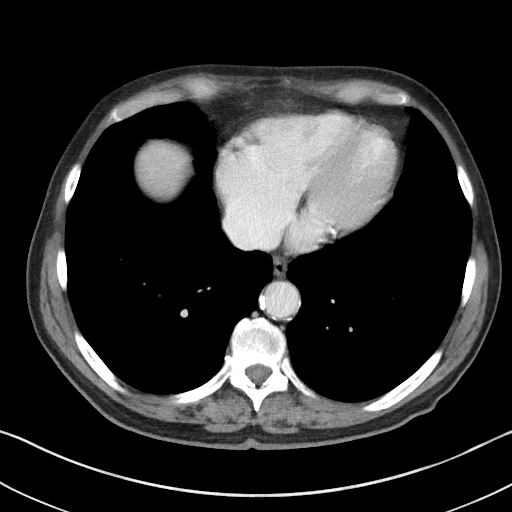
[im 82/99  lung]
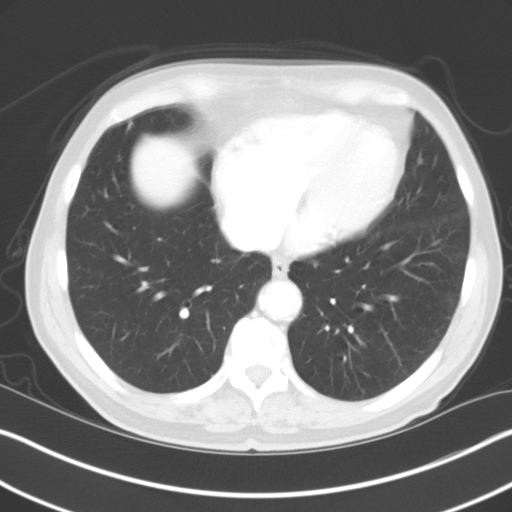

[Series 8: coronal mpr · coronal · 0.74mm/px · 2 of 141 slices shown, 3 images]
[im 47/141  soft-tissue]
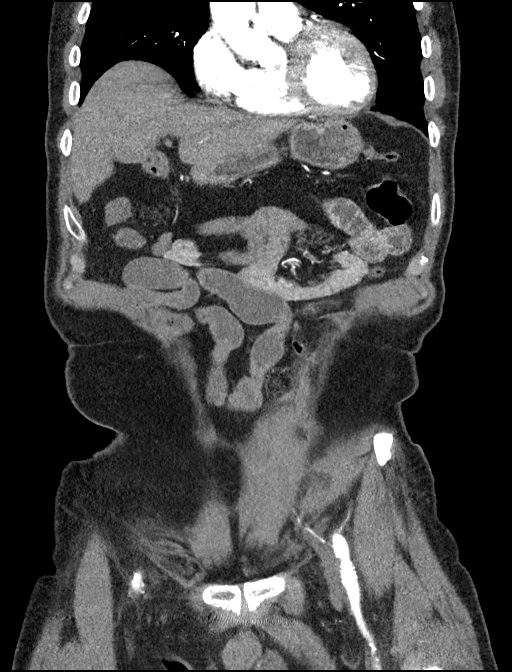
[im 47/141  bone]
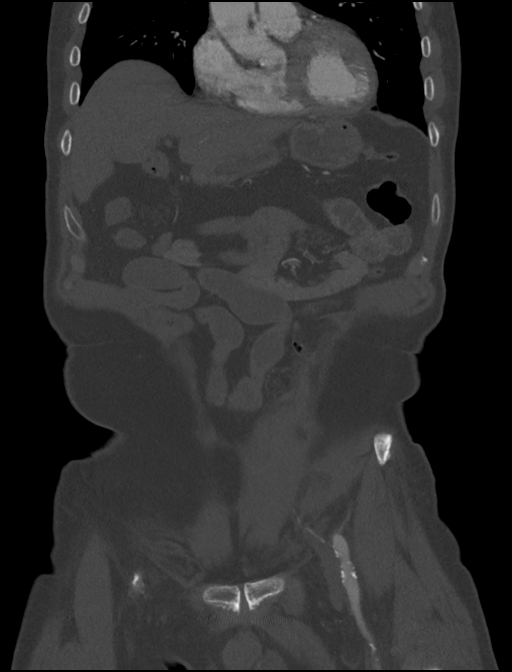
[im 94/141  soft-tissue]
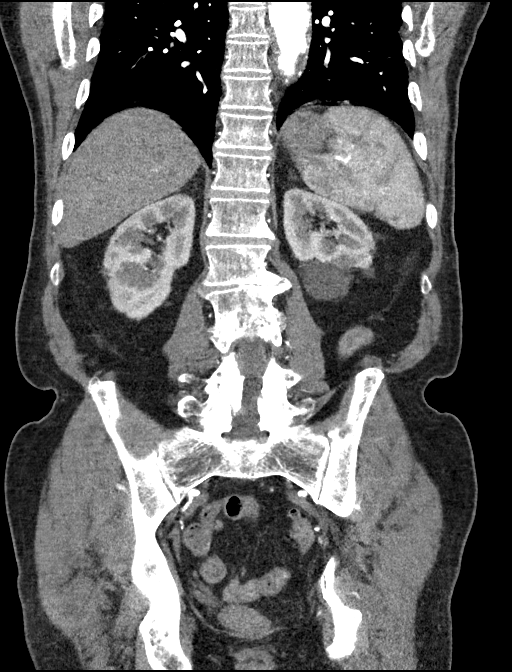

[10 of 46 positions shown; findings below may reference images not displayed]

FINDINGS: VASCULAR

Aorta: Distal descending aorta shows atherosclerotic change and some
mural thrombus although no dissection is seen. The abdominal aorta
is well visualized and shows atherosclerotic change. There are
changes consistent with the known history of aortic aneurysm repair.
Pseudoaneurysm is again identified at the distal anastomotic site.
The width is essentially stable at approximately 17 mm. The
enhancing portion of the aneurysm however has increased somewhat
from 7 mm to 11 mm. Some mural thrombus is noted within the
pseudoaneurysm as well. The overall size of the pseudoaneurysm has
not significantly increased in the interval from the prior exam
measuring approximately 2.2 cm in AP dimension. It measures
approximately 2.4 cm in transverse dimension.. More distal aorta
demonstrates a normal bifurcation. Atherosclerotic changes in the
iliac vessels are noted with stable appearing dissection flap in the
distal aspect of the left common iliac artery..

Celiac: Patent without evidence of aneurysm, dissection, vasculitis
or significant stenosis. Splenic artery is significantly tortuous
with calcifications similar to that seen on prior exam.

SMA: Patent without evidence of aneurysm, dissection, vasculitis or
significant stenosis.

Renals: Single renal arteries are noted bilaterally with
atherosclerotic changes. Mild narrowing is again noted in the
proximal left renal artery stable from the prior exam.

IMA: Its branches are visualized via retrograde enhancement from the
superior mesenteric artery. The proximal segment of the IMA is not
visualized consistent with the prior surgical repair.

Veins: No venous abnormality is noted at this time.

Review of the MIP images confirms the above findings.

NON-VASCULAR

Lower chest: Emphysematous changes are noted. Coronary
calcifications are seen.

Hepatobiliary: Gallbladder has been surgically removed. Fatty
infiltration of the liver is noted.

Pancreas: Unremarkable. No pancreatic ductal dilatation or
surrounding inflammatory changes.

Spleen: Normal in size without focal abnormality.

Adrenals/Urinary Tract: The adrenal glands are within normal limits.
Cystic changes of the left kidney are again seen and stable. The
bladder is decompressed. No calculi or obstructive changes are seen.

Stomach/Bowel: No obstructive or inflammatory changes are seen in
the bowel. The appendix is not well visualized. No inflammatory
changes are seen.

Lymphatic: No significant lymphadenopathy is identified.

Reproductive: Prostate brachytherapy seeds are noted.

Other: No abdominal wall hernia or abnormality. No abdominopelvic
ascites.

Musculoskeletal: Degenerative changes of the lumbar spine are noted.
No acute bony abnormality is seen.
IMPRESSION: VASCULAR

Postsurgical changes of the abdominal aorta are again noted. A
pseudoaneurysm is noted at the inferior anastomotic site. The
overall size of the pseudoaneurysm has not significantly changed in
the interval from the prior exam. The degree of enhancement within
however has increased slightly in the interval from the prior exam.
This may represent some remodeling of the mural thrombus within the
pseudoaneurysm.

Otherwise stable vascular structures when compared with the prior
exam.

NON-VASCULAR

Chronic changes as described above.  No acute abnormality is noted.

These results were called by telephone at the time of interpretation
on 02/09/2018 at [DATE] to Dr. Abimelk Tiger CMA, who verbally
acknowledged these results.

## 2018-03-23 ENCOUNTER — Institutional Professional Consult (permissible substitution) (INDEPENDENT_AMBULATORY_CARE_PROVIDER_SITE_OTHER): Payer: Medicare Other | Admitting: Surgery

## 2018-03-23 ENCOUNTER — Other Ambulatory Visit: Payer: Self-pay | Admitting: *Deleted

## 2018-03-23 ENCOUNTER — Encounter: Payer: Self-pay | Admitting: Surgery

## 2018-03-23 VITALS — BP 117/62 | HR 64 | Resp 20 | Ht 69.0 in | Wt 156.0 lb

## 2018-03-23 DIAGNOSIS — I2510 Atherosclerotic heart disease of native coronary artery without angina pectoris: Secondary | ICD-10-CM

## 2018-03-23 NOTE — Progress Notes (Signed)
Cardiothoracic Surgery Consultation   PCP is [name], MD Referring Provider is Jettie Booze, MD  Chief Complaint  Patient presents with  . Coronary Artery Disease    Surgical eval, Cardiac Cath 03/15/18,     HPI:  The patient is an 84 year old active gentleman with a history of hypertension, hyperlipidemia, thrombocytopenia, and prior abdominal aortic aneurysm treated with open repair in 1999 with subsequent distal anastomotic pseudoaneurysm that is being followed by Dr. Juanda Crumble fields who was recently evaluated by Dr. Irish Lack for complaints of exertional chest discomfort.  The patient has been very active and exercises on a treadmill, exercise bike, and lifts weights almost every day.  He noted development of chest discomfort while on the treadmill.  An electric cardiogram was unchanged.  He was started on Imdur and cardiac catheterization was recommended.  He tried to go back riding his exercise bike and using his treadmill but developed recurrent chest discomfort and is now stopped doing that.  He denies any chest discomfort or shortness of breath with normal activities around the house.  He has had no symptoms at rest.  He feels like his energy level has been good until recently.  He underwent cardiac catheterization on 03/15/2018 which showed severe three-vessel coronary disease.  The LAD was heavily calcified throughout the proximal portion with a 90% proximal stenosis.  There was a large diagonal branch that came off after this which had 80% stenosis in it.  Beyond the diagonal branch there is about 80% LAD stenosis.  The left circumflex was a dominant vessel which had significant proximal stenosis.  There was several marginal branches.  1 of them was occluded and filled late by collaterals.  This was a fairly large vessel.  The right coronary artery was a nondominant vessel with 75% stenosis proximally.  The left ventricular ejection fraction was estimated at 65%.  The  patient has a history of thrombocytopenia with platelet counts that range in the 50-60,000 range.  He has been evaluated by Dr. 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  He had a bone marrow biopsy that was consistent with this.   He has a history of a GI bleed in the past.  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  This is been followed by Dr. fields over the past few years and has been stable.  His most recent CTA of the abdomen and pelvis on 02/09/2018 shows the width of the pseudoaneurysm to be essentially stable at approximately 17 mm.  There is some mural thrombus present within the pseudoaneurysm and enhancing portion had increased somewhat from 7 mm to 11 mm although the overall size was unchanged.  It measures 2.4 cm in transverse dimension and 2.2 cm in AP dimension.  Dr. feels felt that this could be followed since it has been stable.  Patient also has a history of moderate bilateral carotid artery stenosis with known occlusion of the right vertebral artery and his last carotid ultrasound in February 2016 showed bilateral 40-59% internal carotid artery stenosis.  He is scheduled to have another study done in about 2 months.  The patient is here today with his wife and 2 sons.  Past Medical History:  Diagnosis Date  . AAA (abdominal aortic aneurysm) ([place]) 1999  . Adenomatous colon polyp   . Aorta aneurysm ([place])   . Aortic aneurysm ([place]) 1999   repair  .  Arthritis   . Basal cell cancer Dec. 24, 2013   Nose  . CAD (coronary artery disease)   . Cancer [facility]) Aug. 2013   prostate Seeding  implant  . Carotid artery occlusion   . Enlarged prostate 2008   Thermotherapy  . Gastroesophageal reflux disease   . Hyperlipidemia   . Hypertension   . Pancytopenia ([place])    sees Dr. Grayland Ormond  . Peripheral vascular disease ([place])   . Psoriasis     Past Surgical History:  Procedure Laterality Date    . ABDOMINAL AORTIC ANEURYSM REPAIR  1999  . CARDIAC CATHETERIZATION  10/2009  . CHOLECYSTECTOMY    . ESOPHAGOGASTRODUODENOSCOPY (EGD) WITH PROPOFOL N/A 06/26/2016   Procedure: ESOPHAGOGASTRODUODENOSCOPY (EGD) WITH PROPOFOL;  Surgeon: Lollie Sails, MD;  Location: [facility] ENDOSCOPY;  Service: Endoscopy;  Laterality: N/A;  . LEFT HEART CATH AND CORONARY ANGIOGRAPHY N/A 03/15/2018   Procedure: LEFT HEART CATH AND CORONARY ANGIOGRAPHY;  Surgeon: Jettie Booze, MD;  Location: [facility];  Service: Cardiovascular;  Laterality: N/A;  . PROSTATE SURGERY    . SPINE SURGERY  2009   ruptured disc  . Visceral angiogram  05/14/2012   Right  groin area  . VISCERAL ANGIOGRAM N/A 05/14/2012   Procedure: VISCERAL ANGIOGRAM;  Surgeon: Elam Dutch, MD;  Location: [facility] CATH LAB;  Service: Cardiovascular;  Laterality: N/A;    Family History  Problem Relation Age of Onset  . Heart attack Father   . Heart disease Father        After age 69  . COPD Father   . Cancer Sister        Breast and Brain  . Heart disease Brother        before age 67  . Heart attack Brother     Social History Social History   Tobacco Use  . Smoking status: Former Smoker    Types: Cigarettes    Last attempt to quit: 04/29/1997    Years since quitting: 20.9  . Smokeless tobacco: Never Used  Substance Use Topics  . Alcohol use: No  . Drug use: No    Current Outpatient Medications  Medication Sig Dispense Refill  . felodipine (PLENDIL) 10 MG 24 hr tablet Take 10 mg by mouth daily.    Marland Kitchen glucosamine-chondroitin 500-400 MG tablet Take 1 tablet by mouth daily.     . isosorbide mononitrate (IMDUR) 60 MG 24 hr tablet Take 1 tablet (60 mg total) by mouth daily. 90 tablet 3  . loratadine (CLARITIN) 10 MG tablet Take 10 mg by mouth daily as needed for allergies.    Marland Kitchen MAGNESIUM CITRATE PO Take 400 mg by mouth daily.    . Multiple Vitamin (MULTIVITAMIN) tablet Take 1 tablet by mouth daily.    Marland Kitchen  oxyCODONE-acetaminophen (PERCOCET/ROXICET) 5-325 MG tablet Take 1-2 tablets by mouth daily as needed for severe pain.     . polyethylene glycol (MIRALAX / GLYCOLAX) packet Take 17 g by mouth daily. (Patient taking differently: Take 17 g by mouth daily as needed for moderate constipation. ) 14 each 0  . rosuvastatin (CRESTOR) 20 MG tablet Take 20 mg by mouth daily.    . sucralfate (CARAFATE) 1 GM/10ML suspension Take 1 g by mouth daily.     . valsartan-hydrochlorothiazide (DIOVAN-HCT) 320-25 MG per tablet Take 1 tablet by mouth daily.  5   No current facility-administered medications for this visit.     Allergies  Allergen Reactions  . Ciprofloxacin Other (See Comments)  Abdominal Pain     Review of Systems  Constitutional: Positive for activity change. Negative for fatigue.  HENT: Negative.   Eyes: Negative.   Respiratory: Negative for shortness of breath.   Cardiovascular: Positive for chest pain. Negative for palpitations and leg swelling.       With exertion   Gastrointestinal: Negative.   Endocrine: Negative.   Genitourinary: Negative.   Musculoskeletal: Negative.   Skin:       Petechial skin lesions on both lower extremities which his hematologist thought were related to thrombocytopenia  Allergic/Immunologic: Negative.   Neurological: Negative.   Hematological: Bruises/bleeds easily.  Psychiatric/Behavioral: Negative.     BP 117/62   Pulse 64   Resp 20   Ht _0  (1.753 m)   Wt 156 lb (70.8 kg)   SpO2 99% Comment: RA  BMI 23.04 kg/m  Physical Exam  Constitutional: He is oriented to person, place, and time. He appears well-developed and well-nourished. No distress.  HENT:  Head: Normocephalic and atraumatic.  Mouth/Throat: Oropharynx is clear and moist.  Eyes: Pupils are equal, round, and reactive to light. Conjunctivae and EOM are normal.  Neck: Normal range of motion. Neck supple. No JVD present. No thyromegaly present.  Cardiovascular: Normal rate, regular  rhythm and intact distal pulses.  Murmur heard. 3/6 systolic murmur at apex  Pulmonary/Chest: Effort normal and breath sounds normal. No respiratory distress.  Abdominal: Soft. Bowel sounds are normal. He exhibits no distension and no mass. There is no tenderness.  Old laparotomy scar  Musculoskeletal: Normal range of motion. He exhibits no edema.  Lymphadenopathy:    He has no cervical adenopathy.  Neurological: He is alert and oriented to person, place, and time. He has normal strength. No cranial nerve deficit or sensory deficit.  Skin: Skin is warm and dry.  Tiny,red, petechial skin lesions on both lower extremities.  Psychiatric: He has a normal mood and affect.     Diagnostic Tests:  LOGIN MUCKLEROY  CARDIAC CATHETERIZATION  Order# [id number]  Reading physician: Jettie Booze, MD Ordering physician: Jettie Booze, MD Study date: 03/15/18  Physicians   Panel Physicians Referring Physician Case Authorizing Physician  Jettie Booze, MD (Primary)    Procedures   LEFT HEART CATH AND CORONARY ANGIOGRAPHY  Conclusion     Prox LAD lesion is 90% stenosed. This lesion is heavily calcified.  Ost 1st Diag to 1st Diag lesion is 80% stenosed.  Prox LAD to Mid LAD lesion is 80% stenosed.  Ost 2nd Mrg to 2nd Mrg lesion is 100% stenosed.  Prox Cx to Mid Cx lesion is 40% stenosed.  Prox RCA lesion is 75% stenosed. THis is a nondominant vessel.  There is hyperdynamic left ventricular systolic function.  LV end diastolic pressure is normal.  The left ventricular ejection fraction is greater than 65% by visual estimate.  There is no aortic valve stenosis.   Multivessel disease.  Will refer for CVTS eval.  Despite his age, he is very active and has been participating in competitive athletics until the last few months.    Percutaneous intervention would require atherectomy and dual antiplatelet therapy.    Indications   Angina pectoris ([place]) [I20.9  (ICD-10-CM)]  Procedural Details/Technique   Technical Details The risks, benefits, and details of the procedure were explained to the patient. The patient verbalized understanding and wanted to proceed. Informed written consent was obtained.  PROCEDURE TECHNIQUE: After Xylocaine anesthesia a 36F slender sheath was placed in the right radial  artery with a single anterior needle wall stick. IV Heparin was given. Left ventriculography was done using a JR4 catheter. Right coronary angiography was done using a 3DRC guide catheter. Left coronary angiography was done using a Judkins L3.5 guide catheter. A TR band was used for hemostasis.  Contrast: 85 cc     Estimated blood loss <50 mL.  During this procedure the patient was administered the following to achieve and maintain moderate conscious sedation: Versed 1 mg, Fentanyl mcg, while the patient's heart rate, blood pressure, and oxygen saturation were continuously monitored. The period of conscious sedation was 22 minutes, of which I was present face-to-face 100% of this time.  Complications   Complications documented before study signed (03/15/2018 12:28 PM EDT)    No complications were associated with this study.  Documented by Jettie Booze, MD - 03/15/2018 12:25 PM EDT    Coronary Findings   Diagnostic  Dominance: Left  Left Anterior Descending  Prox LAD lesion 90% stenosed  Prox LAD lesion is 90% stenosed. The lesion is calcified.  Prox LAD to Mid LAD lesion 80% stenosed  Prox LAD to Mid LAD lesion is 80% stenosed.  First Diagonal Branch  Ost 1st Diag to 1st Diag lesion 80% stenosed  Ost 1st Diag to 1st Diag lesion is 80% stenosed.  Left Circumflex  Prox Cx to Mid Cx lesion 40% stenosed  Prox Cx to Mid Cx lesion is 40% stenosed.  Second Obtuse Marginal Branch  Collaterals  2nd Mrg filled by collaterals from 2nd Sept.    Ost 2nd Mrg to 2nd Mrg lesion 100% stenosed  Ost 2nd Mrg to 2nd Mrg lesion is 100% stenosed.  Right  Coronary Artery  Vessel is small.  Prox RCA lesion 75% stenosed  Prox RCA lesion is 75% stenosed. The lesion is moderately calcified.  Intervention   No interventions have been documented.  Wall Motion   Resting       All segments of the heart are normal.          Left Heart   Left Ventricle The left ventricular size is normal. There is hyperdynamic left ventricular systolic function. LV end diastolic pressure is normal. The left ventricular ejection fraction is greater than 65% by visual estimate. No regional wall motion abnormalities.  Aortic Valve There is no aortic valve stenosis.  Coronary Diagrams   Diagnostic Diagram       Implants    No implant documentation for this case.  MERGE Images   Show images for CARDIAC CATHETERIZATION   Link to Procedure Log   Procedure Log    Hemo Data    Most Recent Value  AO Systolic Pressure 101 mmHg  AO Diastolic Pressure 48 mmHg  AO Mean 76 mmHg  LV Systolic Pressure 751 mmHg  LV Diastolic Pressure 0 mmHg  LV EDP 4 mmHg  Arterial Occlusion Pressure Extended Systolic Pressure 025 mmHg  Arterial Occlusion Pressure Extended Diastolic Pressure 47 mmHg  Arterial Occlusion Pressure Extended Mean Pressure 74 mmHg  Left Ventricular Apex Extended Systolic Pressure 852 mmHg  Left Ventricular Apex Extended Diastolic Pressure 0 mmHg  Left Ventricular Apex Extended EDP Pressure 6 mmHg    Impression:  This 84 year old active gentleman has severe three-vessel coronary disease with normal left ventricular systolic function.  He has recurrent substernal chest discomfort with low-level exertion while walking on his treadmill or riding his exercise bike.  This is markedly limiting his quality of life according to he and his family.  He  has significant calcification throughout the proximal LAD and percutaneous intervention would require atherectomy and use of dual antiplatelet therapy which could become complicated in this patient with  chronic thrombocytopenia.  I agree that coronary bypass graft surgery is the best treatment for his coronary disease.  Although he is 84 years old he is physiologically much younger and I think he would tolerate coronary bypass graft surgery with low surgical risk.  He does have chronic thrombocytopenia with a platelet count of 50-60,000.  His last count was 76,000.  We would most likely have to give him platelet transfusions at the time of surgery.  After coronary bypass graft surgery I think he could be maintained on low-dose aspirin and if he developed any bleeding then discontinuation of all antiplatelet agents would still be acceptable.  I have reviewed the cardiac catheterization findings with the patient and his family and answered all their questions.  He does have a murmur on exam and will require a 2D echocardiogram to evaluate this further. I discussed the operative procedure with the patient and family including alternatives, benefits and risks; including but not limited to bleeding, blood transfusion, infection, stroke, myocardial infarction, graft failure, heart block requiring a permanent pacemaker, organ dysfunction, and death.  Hale Drone understands and agrees to proceed.  We will schedule surgery for Friday, April 02, 2018.  He will have an echocardiogram and carotid Doppler examination later this week and I will call him with the results.  Plan:  The patient will have an echocardiogram and carotid Doppler examination scheduled for later this week and I will call him with results.  He will be scheduled for coronary artery bypass graft surgery on Friday, April 02, 2018.  He understands that the procedure may change depending on the results of his echocardiogram and then I will discuss that with him later this week.   I spent 60 minutes performing this consultation and > 50% of this time was spent face to face counseling and coordinating the care of this patient's severe three-vessel  coronary artery disease.  Gaye Pollack, MD [facility] [phone]

## 2018-03-24 ENCOUNTER — Other Ambulatory Visit: Payer: Self-pay | Admitting: *Deleted

## 2018-03-24 DIAGNOSIS — R011 Cardiac murmur, unspecified: Secondary | ICD-10-CM

## 2018-03-24 DIAGNOSIS — I2510 Atherosclerotic heart disease of native coronary artery without angina pectoris: Secondary | ICD-10-CM

## 2018-03-26 ENCOUNTER — Other Ambulatory Visit (HOSPITAL_COMMUNITY): Payer: Medicare Other

## 2018-03-29 ENCOUNTER — Other Ambulatory Visit: Payer: Self-pay | Admitting: *Deleted

## 2018-03-29 DIAGNOSIS — D469 Myelodysplastic syndrome, unspecified: Secondary | ICD-10-CM

## 2018-03-30 ENCOUNTER — Inpatient Hospital Stay: Payer: Medicare Other | Attending: Oncology

## 2018-03-30 ENCOUNTER — Telehealth: Payer: Self-pay | Admitting: Interventional Cardiology

## 2018-03-30 ENCOUNTER — Ambulatory Visit: Payer: Medicare Other | Admitting: Physician Assistant

## 2018-03-30 DIAGNOSIS — D469 Myelodysplastic syndrome, unspecified: Secondary | ICD-10-CM

## 2018-03-30 DIAGNOSIS — D6959 Other secondary thrombocytopenia: Secondary | ICD-10-CM | POA: Diagnosis not present

## 2018-03-30 LAB — CBC WITH DIFFERENTIAL/PLATELET
Basophils Absolute: 0 10*3/uL (ref 0–0.1)
Basophils Relative: 1 %
Eosinophils Absolute: 0 10*3/uL (ref 0–0.7)
Eosinophils Relative: 1 %
HCT: 33.1 % — ABNORMAL LOW (ref 40.0–52.0)
Hemoglobin: 11.6 g/dL — ABNORMAL LOW (ref 13.0–18.0)
Lymphocytes Relative: 24 %
Lymphs Abs: 0.8 10*3/uL — ABNORMAL LOW (ref 1.0–3.6)
MCH: 29.6 pg (ref 26.0–34.0)
MCHC: 34.9 g/dL (ref 32.0–36.0)
MCV: 84.6 fL (ref 80.0–100.0)
Monocytes Absolute: 0.6 10*3/uL (ref 0.2–1.0)
Monocytes Relative: 17 %
Neutro Abs: 2 10*3/uL (ref 1.4–6.5)
Neutrophils Relative %: 57 %
Platelets: 46 10*3/uL — ABNORMAL LOW (ref 150–440)
RBC: 3.91 MIL/uL — ABNORMAL LOW (ref 4.40–5.90)
RDW: 14.3 % (ref 11.5–14.5)
WBC: 3.4 10*3/uL — ABNORMAL LOW (ref 3.8–10.6)

## 2018-03-30 NOTE — Telephone Encounter (Signed)
New Message:     Pt states he has a lot of bruising on his arm from Catherization done 2 wks and pt states it just doesn't look good at all. Pt states a white bump has popped up out of his arm.

## 2018-03-30 NOTE — Telephone Encounter (Signed)
Patient calling and states that his cath site has worsened since the last time we talked. Patient had a heart cath on 3/25 via right radial artery. Patient states that the bruising on his right arm has gotten worse and that he has a bump that has developed about the size of a dime. Patient also states that he is starting to have some mild pain as well. Patient denies bleeding, numbness, or tingling. Patient has thrombocytopenia. Scheduled patient to come in to the office today to see Richardson Dopp, PA at 3:45 PM. ER precautions reviewed with the patient. Patient verbalized understanding and thanked me for the call.

## 2018-03-31 ENCOUNTER — Other Ambulatory Visit (HOSPITAL_COMMUNITY): Payer: Medicare Other

## 2018-03-31 ENCOUNTER — Encounter (HOSPITAL_COMMUNITY): Payer: Medicare Other

## 2018-04-01 NOTE — Telephone Encounter (Signed)
Called to follow up with the patient since he cancelled his appointment on 4/9 with SW. Patient states that he has pre-CABG testing tomorrow at [facility] and he will have them look at it then. Patient states that he believes it is starting to look better now.

## 2018-04-02 ENCOUNTER — Ambulatory Visit (HOSPITAL_COMMUNITY)
Admission: RE | Admit: 2018-04-02 | Discharge: 2018-04-02 | Disposition: A | Discharge status: Home / Self Care | Payer: Medicare Other | Source: Ambulatory Visit | Attending: Surgery | Admitting: Surgery

## 2018-04-02 ENCOUNTER — Telehealth: Payer: Self-pay | Admitting: Interventional Cardiology

## 2018-04-02 ENCOUNTER — Ambulatory Visit (HOSPITAL_BASED_OUTPATIENT_CLINIC_OR_DEPARTMENT_OTHER)
Admission: RE | Admit: 2018-04-02 | Discharge: 2018-04-02 | Disposition: A | Discharge status: Home / Self Care | Payer: Medicare Other | Source: Ambulatory Visit | Attending: Surgery | Admitting: Surgery

## 2018-04-02 DIAGNOSIS — R011 Cardiac murmur, unspecified: Secondary | ICD-10-CM | POA: Insufficient documentation

## 2018-04-02 DIAGNOSIS — I059 Rheumatic mitral valve disease, unspecified: Secondary | ICD-10-CM | POA: Insufficient documentation

## 2018-04-02 DIAGNOSIS — I2510 Atherosclerotic heart disease of native coronary artery without angina pectoris: Secondary | ICD-10-CM

## 2018-04-02 NOTE — Telephone Encounter (Signed)
Spoke with patient who is reporting continued soreness at recent cath (3/25) site with some purple bruising that seems to be getting better.  He reports a "knot" at the cath site.  He can feel a pulse at his wrist and there is no hand discoloration.  He had been given an appointment with Richardson Dopp, PA for eval on 4/9 however no showed.  Pt is scheduled today for pre-op (CABG) ultrasound and states he will just have someone look at it when he's here.  He is asking for Richardson Dopp to see him this afternoon.  Advised he is not in the office then.  Pt will continue to monitor his s/s and c/b if necessary.

## 2018-04-02 NOTE — Telephone Encounter (Signed)
New message  Pt verbalzied that he is calling for RN  Pt verbalized that his right arm very swollen sore and purple  Pt had heartcath 3 weeks ago   He is requesting to see or speak to [facility]

## 2018-04-02 NOTE — Progress Notes (Signed)
  Echocardiogram 2D Echocardiogram has been performed.  Jennette Dubin 04/02/2018, 2:47 PM

## 2018-04-02 NOTE — Progress Notes (Signed)
Pre-CABG testing has been completed. 40-59% ICA stenosis bilaterally.  ABI's Right 1.07 Left 1.12  04/02/18 1:55 PM [name] RVT

## 2018-04-05 NOTE — Pre-Procedure Instructions (Signed)
RAMIZ TURPIN  04/05/2018      [id number] [address], [place] [address] Phone: [phone] Fax: [phone]    Your procedure is scheduled on April 18  Report to [facility] [place].M.  Call this number if you have problems the morning of surgery:  [phone]   Remember:  Do not eat food or drink liquids after midnight.  Continue all medications as directed by your physician except follow these medication instructions before surgery below   Take these medicines the morning of surgery with A SIP OF WATER  isosorbide mononitrate (IMDUR) loratadine (CLARITIN) oxyCODONE-acetaminophen (PERCOCET/ROXICET)    7 days prior to surgery STOP taking any Aspirin(unless otherwise instructed by your surgeon), Aleve, Naproxen, Ibuprofen, Motrin, Advil, Goody's, BC's, all herbal medications, fish oil, and all vitamins    Do not wear jewelry  Do not wear lotions, powders, or cologne, or deodorant.  Men may shave face and neck.  Do not bring valuables to the hospital.  [facility] is not responsible for any belongings or valuables.  Contacts, dentures or bridgework may not be worn into surgery.  Leave your suitcase in the car.  After surgery it may be brought to your room.  For patients admitted to the hospital, discharge time will be determined by your treatment team.  Patients discharged the day of surgery will not be allowed to drive home.    Special instructions:   - Preparing For Surgery  Before surgery, you can play an important role. Because skin is not sterile, your skin needs to be as free of germs as possible. You can reduce the number of germs on your skin by washing with CHG (chlorahexidine gluconate) Soap before surgery.  CHG is an antiseptic cleaner which kills germs and bonds with the skin to continue killing germs even after washing.  Please do not use if you have an allergy to  CHG or antibacterial soaps. If your skin becomes reddened/irritated stop using the CHG.  Do not shave (including legs and underarms) for at least 48 hours prior to first CHG shower. It is OK to shave your face.  Please follow these instructions carefully.   1. Shower the NIGHT BEFORE SURGERY and the MORNING OF SURGERY with CHG.   2. If you chose to wash your hair, wash your hair first as usual with your normal shampoo.  3. After you shampoo, rinse your hair and body thoroughly to remove the shampoo.  4. Use CHG as you would any other liquid soap. You can apply CHG directly to the skin and wash gently with a scrungie or a clean washcloth.   5. Apply the CHG Soap to your body ONLY FROM THE NECK DOWN.  Do not use on open wounds or open sores. Avoid contact with your eyes, ears, mouth and genitals (private parts). Wash Face and genitals (private parts)  with your normal soap.  6. Wash thoroughly, paying special attention to the area where your surgery will be performed.  7. Thoroughly rinse your body with warm water from the neck down.  8. DO NOT shower/wash with your normal soap after using and rinsing off the CHG Soap.  9. Pat yourself dry with a CLEAN TOWEL.  10. Wear CLEAN PAJAMAS to bed the night before surgery, wear comfortable clothes the morning of surgery  11. Place CLEAN SHEETS on your bed the night of your first shower and  DO NOT SLEEP WITH PETS.    Day of Surgery: Do not apply any deodorants/lotions. Please wear clean clothes to the hospital/surgery center.      Please read over the following fact sheets that you were given.

## 2018-04-06 ENCOUNTER — Encounter (HOSPITAL_COMMUNITY): Payer: Self-pay

## 2018-04-06 ENCOUNTER — Ambulatory Visit: Payer: Medicare Other | Admitting: Interventional Cardiology

## 2018-04-06 ENCOUNTER — Other Ambulatory Visit: Payer: Self-pay

## 2018-04-06 ENCOUNTER — Ambulatory Visit (HOSPITAL_COMMUNITY)
Admission: RE | Admit: 2018-04-06 | Discharge: 2018-04-06 | Disposition: A | Discharge status: Home / Self Care | Payer: Medicare Other | Source: Ambulatory Visit | Attending: Surgery | Admitting: Surgery

## 2018-04-06 ENCOUNTER — Encounter (HOSPITAL_COMMUNITY)
Admission: RE | Admit: 2018-04-06 | Discharge: 2018-04-06 | Disposition: A | Discharge status: Home / Self Care | Payer: Medicare Other | Source: Ambulatory Visit | Attending: Surgery | Admitting: Surgery

## 2018-04-06 DIAGNOSIS — I452 Bifascicular block: Secondary | ICD-10-CM

## 2018-04-06 DIAGNOSIS — K219 Gastro-esophageal reflux disease without esophagitis: Secondary | ICD-10-CM

## 2018-04-06 DIAGNOSIS — I998 Other disorder of circulatory system: Secondary | ICD-10-CM | POA: Insufficient documentation

## 2018-04-06 DIAGNOSIS — Z79899 Other long term (current) drug therapy: Secondary | ICD-10-CM | POA: Insufficient documentation

## 2018-04-06 DIAGNOSIS — I4510 Unspecified right bundle-branch block: Secondary | ICD-10-CM

## 2018-04-06 DIAGNOSIS — I2510 Atherosclerotic heart disease of native coronary artery without angina pectoris: Secondary | ICD-10-CM

## 2018-04-06 DIAGNOSIS — I10 Essential (primary) hypertension: Secondary | ICD-10-CM

## 2018-04-06 DIAGNOSIS — Z01818 Encounter for other preprocedural examination: Secondary | ICD-10-CM

## 2018-04-06 DIAGNOSIS — I444 Left anterior fascicular block: Secondary | ICD-10-CM | POA: Insufficient documentation

## 2018-04-06 DIAGNOSIS — Z01812 Encounter for preprocedural laboratory examination: Secondary | ICD-10-CM

## 2018-04-06 DIAGNOSIS — Z01810 Encounter for preprocedural cardiovascular examination: Secondary | ICD-10-CM | POA: Insufficient documentation

## 2018-04-06 HISTORY — DX: Myelodysplastic syndrome, unspecified: D46.9

## 2018-04-06 LAB — COMPREHENSIVE METABOLIC PANEL
ALT: 22 U/L (ref 17–63)
AST: 29 U/L (ref 15–41)
Albumin: 4.4 g/dL (ref 3.5–5.0)
Alkaline Phosphatase: 45 U/L (ref 38–126)
Anion gap: 11 (ref 5–15)
BUN: 17 mg/dL (ref 6–20)
CO2: 22 mmol/L (ref 22–32)
Calcium: 9.6 mg/dL (ref 8.9–10.3)
Chloride: 100 mmol/L — ABNORMAL LOW (ref 101–111)
Creatinine, Ser: 0.88 mg/dL (ref 0.61–1.24)
GFR calc Af Amer: >60 mL/min (ref >60)
GFR calc non Af Amer: >60 mL/min (ref >60)
Glucose, Bld: 117 mg/dL — ABNORMAL HIGH (ref 65–99)
Potassium: 3.8 mmol/L (ref 3.5–5.1)
Sodium: 133 mmol/L — ABNORMAL LOW (ref 135–145)
Total Bilirubin: 0.9 mg/dL (ref 0.3–1.2)
Total Protein: 6.5 g/dL (ref 6.5–8.1)

## 2018-04-06 LAB — CBC
HCT: 34.1 % — ABNORMAL LOW (ref 39.0–52.0)
Hemoglobin: 11.5 g/dL — ABNORMAL LOW (ref 13.0–17.0)
MCH: 28.5 pg (ref 26.0–34.0)
MCHC: 33.7 g/dL (ref 30.0–36.0)
MCV: 84.4 fL (ref 78.0–100.0)
Platelets: 39 10*3/uL — ABNORMAL LOW (ref 150–400)
RBC: 4.04 MIL/uL — ABNORMAL LOW (ref 4.22–5.81)
RDW: 13.5 % (ref 11.5–15.5)
WBC: 3.8 10*3/uL — ABNORMAL LOW (ref 4.0–10.5)

## 2018-04-06 LAB — PULMONARY FUNCTION TEST
DL/VA % pred: 67 %
DL/VA: 3.02 ml/min/mmHg/L
DLCO unc % pred: 60 %
DLCO unc: 18.73 ml/min/mmHg
FEF 25-75 Pre: 1.99 L/sec
FEF2575-%Pred-Pre: 143 %
FEV1-%Pred-Pre: 113 %
FEV1-Pre: 2.65 L
FEV1FVC-%Pred-Pre: 106 %
FEV6-%Pred-Pre: 112 %
FEV6-Pre: 3.54 L
FEV6FVC-%Pred-Pre: 108 %
FVC-%Pred-Pre: 103 %
FVC-Pre: 3.56 L
Pre FEV1/FVC ratio: 75 %
Pre FEV6/FVC Ratio: 100 %
RV % pred: 109 %
RV: 3.07 L
TLC % pred: 104 %
TLC: 7.2 L

## 2018-04-06 LAB — BLOOD GAS, ARTERIAL
Acid-Base Excess: 2.2 mmol/L — ABNORMAL HIGH (ref 0.0–2.0)
Bicarbonate: 25.5 mmol/L (ref 20.0–28.0)
Drawn by: 421801
FIO2: 21
O2 Saturation: 98.9 %
Patient temperature: 98.6
pCO2 arterial: 34.4 mmHg (ref 32.0–48.0)
pH, Arterial: 7.483 — ABNORMAL HIGH (ref 7.350–7.450)
pO2, Arterial: 117 mmHg — ABNORMAL HIGH (ref 83.0–108.0)

## 2018-04-06 LAB — SURGICAL PCR SCREEN
MRSA, PCR: NEGATIVE
Staphylococcus aureus: NEGATIVE

## 2018-04-06 LAB — URINALYSIS, ROUTINE W REFLEX MICROSCOPIC
Bilirubin Urine: NEGATIVE
Glucose, UA: NEGATIVE mg/dL
Hgb urine dipstick: NEGATIVE
Ketones, ur: NEGATIVE mg/dL
Leukocytes, UA: NEGATIVE
Nitrite: NEGATIVE
Protein, ur: NEGATIVE mg/dL
Specific Gravity, Urine: 1.012 (ref 1.005–1.030)
pH: 6 (ref 5.0–8.0)

## 2018-04-06 LAB — HEMOGLOBIN A1C
Hgb A1c MFr Bld: 5.6 % (ref 4.8–5.6)
Mean Plasma Glucose: 114.02 mg/dL

## 2018-04-06 LAB — PROTIME-INR
INR: 1.08
Prothrombin Time: 13.9 seconds (ref 11.4–15.2)

## 2018-04-06 LAB — APTT: aPTT: 37 seconds — ABNORMAL HIGH (ref 24–36)

## 2018-04-06 MED ORDER — ALBUTEROL SULFATE (2.5 MG/3ML) 0.083% IN NEBU: 2.5000 mg | INHALATION_SOLUTION | Freq: Once | RESPIRATORY_TRACT | Status: DC

## 2018-04-06 NOTE — Progress Notes (Signed)
Spoke with Levonne Spiller about patients lab values, and to look out of platelet results

## 2018-04-06 NOTE — Progress Notes (Signed)
PCP - [place]  Chest x-ray - 04/06/18 EKG - 04/06/18 Stress Test - 2016 care Everywhere ECHO - 2019 Cardiac [id number] -    Anesthesia review: yes patient stated that his platelets were in the 40s last week.  Dr. Cyndia Bent is aware   Patient denies shortness of breath, fever, cough and chest pain at PAT appointment   Patient verbalized understanding of instructions that were given to them at the PAT appointment. Patient was also instructed that they will need to review over the PAT instructions again at home before surgery.

## 2018-04-07 ENCOUNTER — Encounter (HOSPITAL_COMMUNITY): Payer: Self-pay

## 2018-04-07 MED ORDER — POTASSIUM CHLORIDE 2 MEQ/ML IV SOLN
80.0000 meq | INTRAVENOUS | Status: DC
  Filled 2018-04-07: qty 40

## 2018-04-07 MED ORDER — SODIUM CHLORIDE 0.9 % IV SOLN
INTRAVENOUS | Status: AC
  Administered 2018-04-08: 1 [IU]/h via INTRAVENOUS
  Filled 2018-04-07: qty 1

## 2018-04-07 MED ORDER — SODIUM CHLORIDE 0.9 % IV SOLN
30.0000 ug/min | INTRAVENOUS | Status: AC
  Administered 2018-04-08: 25 ug/min via INTRAVENOUS
  Filled 2018-04-07: qty 2

## 2018-04-07 MED ORDER — TRANEXAMIC ACID (OHS) BOLUS VIA INFUSION
15.0000 mg/kg | INTRAVENOUS | Status: AC
  Administered 2018-04-08: 1056 mg via INTRAVENOUS
  Filled 2018-04-07: qty 1056

## 2018-04-07 MED ORDER — TRANEXAMIC ACID 1000 MG/10ML IV SOLN
1.5000 mg/kg/h | INTRAVENOUS | Status: AC
Start: 2018-04-08 — End: 2018-04-08
  Administered 2018-04-08: 1.5 mg/kg/h via INTRAVENOUS
  Filled 2018-04-07: qty 25

## 2018-04-07 MED ORDER — EPINEPHRINE PF 1 MG/ML IJ SOLN
0.0000 ug/min | INTRAMUSCULAR | Status: DC
  Filled 2018-04-07: qty 4

## 2018-04-07 MED ORDER — SODIUM CHLORIDE 0.9 % IV SOLN
750.0000 mg | INTRAVENOUS | Status: DC
  Filled 2018-04-07: qty 750

## 2018-04-07 MED ORDER — NITROGLYCERIN IN D5W 200-5 MCG/ML-% IV SOLN
2.0000 ug/min | INTRAVENOUS | Status: AC
  Administered 2018-04-08: 5 ug/min via INTRAVENOUS
  Filled 2018-04-07: qty 250

## 2018-04-07 MED ORDER — DOPAMINE-DEXTROSE 3.2-5 MG/ML-% IV SOLN
0.0000 ug/kg/min | INTRAVENOUS | Status: DC
  Filled 2018-04-07: qty 250

## 2018-04-07 MED ORDER — MAGNESIUM SULFATE 50 % IJ SOLN
40.0000 meq | INTRAMUSCULAR | Status: DC
Start: 2018-04-08 — End: 2018-04-08
  Filled 2018-04-07: qty 9.85

## 2018-04-07 MED ORDER — SODIUM CHLORIDE 0.9 % IV SOLN
1.5000 g | INTRAVENOUS | Status: AC
  Administered 2018-04-08: 1.5 g via INTRAVENOUS
  Filled 2018-04-07: qty 1.5

## 2018-04-07 MED ORDER — PAPAVERINE HCL 30 MG/ML IJ SOLN
INTRAMUSCULAR | Status: AC
  Administered 2018-04-08: 500 mL
  Filled 2018-04-07: qty 2.5

## 2018-04-07 MED ORDER — MILRINONE LACTATE IN DEXTROSE 20-5 MG/100ML-% IV SOLN
0.1250 ug/kg/min | INTRAVENOUS | Status: DC
Start: 2018-04-08 — End: 2018-04-08
  Filled 2018-04-07: qty 100

## 2018-04-07 MED ORDER — TRANEXAMIC ACID (OHS) PUMP PRIME SOLUTION
2.0000 mg/kg | INTRAVENOUS | Status: DC
  Filled 2018-04-07: qty 1.41

## 2018-04-07 MED ORDER — VANCOMYCIN HCL 10 G IV SOLR
1250.0000 mg | INTRAVENOUS | Status: AC
  Administered 2018-04-08: 1250 mg via INTRAVENOUS
  Filled 2018-04-07: qty 1250

## 2018-04-07 MED ORDER — SODIUM CHLORIDE 0.9 % IV SOLN
INTRAVENOUS | Status: DC
  Filled 2018-04-07: qty 30

## 2018-04-07 MED ORDER — DEXMEDETOMIDINE HCL IN NACL 400 MCG/100ML IV SOLN
0.1000 ug/kg/h | INTRAVENOUS | Status: AC
  Administered 2018-04-08: 0.7 ug/kg/h via INTRAVENOUS
  Filled 2018-04-07: qty 100

## 2018-04-07 NOTE — Anesthesia Preprocedure Evaluation (Addendum)
Anesthesia Evaluation  Patient identified by MRN, date of birth, ID band Patient awake    Reviewed: Allergy & Precautions, NPO status , Patient's Chart, lab work & pertinent test results  History of Anesthesia Complications Negative for: history of anesthetic complications  Airway Mallampati: II  TM Distance: >3 FB Neck ROM: Full    Dental no notable dental hx.    Pulmonary neg pulmonary ROS, former smoker,    Pulmonary exam normal        Cardiovascular hypertension, + angina + CAD   Rhythm:Regular Rate:Normal + Systolic murmurs    Neuro/Psych negative neurological ROS     GI/Hepatic Neg liver ROS, GERD  ,  Endo/Other  negative endocrine ROS  Renal/GU negative Renal ROS     Musculoskeletal negative musculoskeletal ROS (+)   Abdominal   Peds  Hematology negative hematology ROS (+)   Anesthesia Other Findings Day of surgery medications reviewed with the patient.  Reproductive/Obstetrics                            Anesthesia Physical Anesthesia Plan  ASA: IV  Anesthesia Plan: General   Post-op Pain Management:    Induction: Intravenous  PONV Risk Score and Plan: 3 and Ondansetron, Dexamethasone and Diphenhydramine  Airway Management Planned: Oral ETT  Additional Equipment: Arterial line, PA Cath, 3D TEE and Ultrasound Guidance Line Placement  Intra-op Plan:   Post-operative Plan: Post-operative intubation/ventilation  Informed Consent: I have reviewed the patients History and Physical, chart, labs and discussed the procedure including the risks, benefits and alternatives for the proposed anesthesia with the patient or authorized representative who has indicated his/her understanding and acceptance.   Dental advisory given  Plan Discussed with: Anesthesiologist, Surgeon and CRNA  Anesthesia Plan Comments:        Anesthesia Quick Evaluation

## 2018-04-07 NOTE — H&P (Signed)
[address]       [place] [address]             [phone]      Cardiothoracic Surgery Admission History and Physical   PCP is [name], MD  Referring Provider is Jettie Booze, MD      Chief Complaint  Patient presents with  . Coronary Artery Disease       HPI:  The patient is an 84 year old active gentleman with a history of hypertension, hyperlipidemia, thrombocytopenia, and prior abdominal aortic aneurysm treated with open repair in 1999 with subsequent distal anastomotic pseudoaneurysm that is being followed by Dr. Juanda Crumble fields who was recently evaluated by Dr. Irish Lack for complaints of exertional chest discomfort. The patient has been very active and exercises on a treadmill, exercise bike, and lifts weights almost every day. He noted development of chest discomfort while on the treadmill. An electric cardiogram was unchanged. He was started on Imdur and cardiac catheterization was recommended. He tried to go back riding his exercise bike and using his treadmill but developed recurrent chest discomfort and is now stopped doing that. He denies any chest discomfort or shortness of breath with normal activities around the house. He has had no symptoms at rest. He feels like his energy level has been good until recently. He underwent cardiac catheterization on 03/15/2018 which showed severe three-vessel coronary disease. The LAD was heavily calcified throughout the proximal portion with a 90% proximal stenosis. There was a large diagonal branch that came off after this which had 80% stenosis in it. Beyond the diagonal branch there is about 80% LAD stenosis. The left circumflex was a dominant vessel which had significant proximal stenosis. There was several marginal branches. 1 of them was occluded and filled late by collaterals. This was a fairly large vessel. The right coronary artery was a nondominant vessel with 75% stenosis proximally. The left  ventricular ejection fraction was estimated at 65%.  The patient has a history of thrombocytopenia with platelet counts that range in the 50-60,000 range. He has been evaluated by Dr. 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 He had a bone marrow biopsy that was consistent with this. He has a history of a GI bleed in the past.  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 This is been followed by Dr. fields over the past few years and has been stable. His most recent CTA of the abdomen and pelvis on 02/09/2018 shows the width of the pseudoaneurysm to be essentially stable at approximately 17 mm. There is some mural thrombus present within the pseudoaneurysm and enhancing portion had increased somewhat from 7 mm to 11 mm although the overall size was unchanged. It measures 2.4 cm in transverse dimension and 2.2 cm in AP dimension. Dr. feels felt that this could be followed since it has been stable.  Patient also has a history of moderate bilateral carotid artery stenosis with known occlusion of the right vertebral artery and his last carotid ultrasound in February 2016 showed bilateral 40-59% internal carotid artery stenosis. He is scheduled to have another study done in about 2 months.  The patient is here today with his wife and 2 sons.       Past Medical History:  Diagnosis Date  . AAA (abdominal aortic aneurysm) ([place]) 1999  . Adenomatous colon polyp   . Aorta aneurysm (  [place])   . Aortic aneurysm ([place]) 1999   repair  . Arthritis   . Basal cell cancer Dec. 24, 2013   Nose  . CAD (coronary artery disease)   . Cancer [facility]) Aug. 2013   prostate Seeding implant  . Carotid artery occlusion   . Enlarged prostate 2008   Thermotherapy  . Gastroesophageal reflux disease   . Hyperlipidemia   . Hypertension   . Pancytopenia ([place])    sees Dr. Grayland Ormond  . Peripheral vascular disease ([place])   . Psoriasis          Past Surgical History:  Procedure Laterality Date  . ABDOMINAL AORTIC ANEURYSM REPAIR  1999  . CARDIAC CATHETERIZATION  10/2009  . CHOLECYSTECTOMY    . ESOPHAGOGASTRODUODENOSCOPY (EGD) WITH PROPOFOL N/A 06/26/2016   Procedure: ESOPHAGOGASTRODUODENOSCOPY (EGD) WITH PROPOFOL; Surgeon: Lollie Sails, MD; Location: [facility] ENDOSCOPY; Service: Endoscopy; Laterality: N/A;  . LEFT HEART CATH AND CORONARY ANGIOGRAPHY N/A 03/15/2018   Procedure: LEFT HEART CATH AND CORONARY ANGIOGRAPHY; Surgeon: Jettie Booze, MD; Location: [place] CV LAB; Service: Cardiovascular; Laterality: N/A;  . PROSTATE SURGERY    . SPINE SURGERY  2009   ruptured disc  . Visceral angiogram  05/14/2012   Right groin area  . VISCERAL ANGIOGRAM N/A 05/14/2012   Procedure: VISCERAL ANGIOGRAM; Surgeon: Elam Dutch, MD; Location: [facility] CATH LAB; Service: Cardiovascular; Laterality: N/A;        Family History  Problem Relation Age of Onset  . Heart attack Father   . Heart disease Father    After age 36  . COPD Father   . Cancer Sister    Breast and Brain  . Heart disease Brother    before age 75  . Heart attack Brother    Social History  Social History        Tobacco Use  . Smoking status: Former Smoker    Types: Cigarettes    Last attempt to quit: 04/29/1997    Years since quitting: 20.9  . Smokeless tobacco: Never Used  Substance Use Topics  . Alcohol use: No  . Drug use: No         Current Outpatient Medications  Medication Sig Dispense Refill  . felodipine (PLENDIL) 10 MG 24 hr tablet Take 10 mg by mouth daily.    Marland Kitchen glucosamine-chondroitin 500-400 MG tablet Take 1 tablet by mouth daily.     . isosorbide mononitrate (IMDUR) 60 MG 24 hr tablet Take 1 tablet (60 mg total) by mouth daily. 90 tablet 3  . loratadine (CLARITIN) 10 MG tablet Take 10 mg by mouth daily as needed for allergies.    Marland Kitchen MAGNESIUM CITRATE PO Take 400 mg by mouth daily.    . Multiple Vitamin (MULTIVITAMIN) tablet Take 1 tablet  by mouth daily.    Marland Kitchen oxyCODONE-acetaminophen (PERCOCET/ROXICET) 5-325 MG tablet Take 1-2 tablets by mouth daily as needed for severe pain.     . polyethylene glycol (MIRALAX / GLYCOLAX) packet Take 17 g by mouth daily. (Patient taking differently: Take 17 g by mouth daily as needed for moderate constipation. ) 14 each 0  . rosuvastatin (CRESTOR) 20 MG tablet Take 20 mg by mouth daily.    . sucralfate (CARAFATE) 1 GM/10ML suspension Take 1 g by mouth daily.     . valsartan-hydrochlorothiazide (DIOVAN-HCT) 320-25 MG per tablet Take 1 tablet by mouth daily.  5   No current facility-administered medications for this visit.         Allergies  Allergen Reactions  . Ciprofloxacin Other (See Comments)    Abdominal Pain    Review of Systems   Constitutional: Positive for activity change. Negative for fatigue.  HENT: Negative.  Eyes: Negative.  Respiratory: Negative for shortness of breath.  Cardiovascular: Positive for chest pain. Negative for palpitations and leg swelling.  With exertion  Gastrointestinal: Negative.  Endocrine: Negative.  Genitourinary: Negative.  Musculoskeletal: Negative.  Skin:  Petechial skin lesions on both lower extremities which his hematologist thought were related to thrombocytopenia  Allergic/Immunologic: Negative.  Neurological: Negative.  Hematological: Bruises/bleeds easily.  Psychiatric/Behavioral: Negative.    BP 117/62  Pulse 64  Resp 20  Ht 5' 9"  (1.753 m)  Wt 156 lb (70.8 kg)  SpO2 99% Comment: RA  BMI 23.04 kg/m   Physical Exam   Constitutional: He is oriented to person, place, and time. He appears well-developed and well-nourished. No distress.  HENT:  Head: Normocephalic and atraumatic.  Mouth/Throat: Oropharynx is clear and moist.  Eyes: Pupils are equal, round, and reactive to light. Conjunctivae and EOM are normal.  Neck: Normal range of motion. Neck supple. No JVD present. No thyromegaly present.  Cardiovascular: Normal rate,  regular rhythm and intact distal pulses.  Murmur heard. 3/6 systolic murmur at apex  Pulmonary/Chest: Effort normal and breath sounds normal. No respiratory distress.  Abdominal: Soft. Bowel sounds are normal. He exhibits no distension and no mass. There is no tenderness.  Old laparotomy scar  Musculoskeletal: Normal range of motion. He exhibits no edema.  Lymphadenopathy:  He has no cervical adenopathy.  Neurological: He is alert and oriented to person, place, and time. He has normal strength. No cranial nerve deficit or sensory deficit.  Skin: Skin is warm and dry.  Tiny,red, petechial skin lesions on both lower extremities.  Psychiatric: He has a normal mood and affect.   Diagnostic Tests:    ROMEO ZIELINSKI  CARDIAC CATHETERIZATION  Order# [id number]  Reading physician: Jettie Booze, MD Ordering physician: Jettie Booze, MD Study date: 03/15/18  Physicians  Panel Physicians Referring Physician Case Authorizing Physician  Jettie Booze, MD (Primary)    Procedures  LEFT HEART CATH AND CORONARY ANGIOGRAPHY  Conclusion  Prox LAD lesion is 90% stenosed. This lesion is heavily calcified.  Ost 1st Diag to 1st Diag lesion is 80% stenosed.  Prox LAD to Mid LAD lesion is 80% stenosed.  Ost 2nd Mrg to 2nd Mrg lesion is 100% stenosed.  Prox Cx to Mid Cx lesion is 40% stenosed.  Prox RCA lesion is 75% stenosed. THis is a nondominant vessel.  There is hyperdynamic left ventricular systolic function.  LV end diastolic pressure is normal.  The left ventricular ejection fraction is greater than 65% by visual estimate.  There is no aortic valve stenosis. Multivessel disease. Will refer for CVTS eval. Despite his age, he is very active and has been participating in competitive athletics until the last few months.  Percutaneous intervention would require atherectomy and dual antiplatelet therapy.   Indications  Angina pectoris ([place]) [I20.9 (ICD-10-CM)]  Procedural  Details/Technique  Technical Details The risks, benefits, and details of the procedure were explained to the patient. The patient verbalized understanding and wanted to proceed. Informed written consent was obtained.  PROCEDURE TECHNIQUE: After Xylocaine anesthesia a 56F slender sheath was placed in the right radial artery with a single anterior needle wall stick. IV Heparin was given. Left ventriculography was done using a JR4 catheter. Right coronary angiography was done using a 3DRC guide  catheter. Left coronary angiography was done using a Judkins L3.5 guide catheter. A TR band was used for hemostasis.  Contrast: 85 cc     Estimated blood loss <50 mL.  During this procedure the patient was administered the following to achieve and maintain moderate conscious sedation: Versed 1 mg, Fentanyl mcg, while the patient's heart rate, blood pressure, and oxygen saturation were continuously monitored. The period of conscious sedation was 22 minutes, of which I was present face-to-face 100% of this time.  Complications  Complications documented before study signed (03/15/2018 12:28 PM EDT)   No complications were associated with this study.  Documented by Jettie Booze, MD - 03/15/2018 12:25 PM EDT  Coronary Findings  Diagnostic  Dominance: Left  Left Anterior Descending  Prox LAD lesion 90% stenosed  Prox LAD lesion is 90% stenosed. The lesion is calcified.  Prox LAD to Mid LAD lesion 80% stenosed  Prox LAD to Mid LAD lesion is 80% stenosed.  First Diagonal Branch  Ost 1st Diag to 1st Diag lesion 80% stenosed  Ost 1st Diag to 1st Diag lesion is 80% stenosed.  Left Circumflex  Prox Cx to Mid Cx lesion 40% stenosed  Prox Cx to Mid Cx lesion is 40% stenosed.  Second Obtuse Marginal Branch  Collaterals  2nd Mrg filled by collaterals from 2nd Sept.    Ost 2nd Mrg to 2nd Mrg lesion 100% stenosed  Ost 2nd Mrg to 2nd Mrg lesion is 100% stenosed.  Right Coronary Artery  Vessel is small.    Prox RCA lesion 75% stenosed  Prox RCA lesion is 75% stenosed. The lesion is moderately calcified.  Intervention  No interventions have been documented.  Wall Motion        Resting         All segments of the heart are normal.        Left Heart  Left Ventricle The left ventricular size is normal. There is hyperdynamic left ventricular systolic function. LV end diastolic pressure is normal. The left ventricular ejection fraction is greater than 65% by visual estimate. No regional wall motion abnormalities.  Aortic Valve There is no aortic valve stenosis.  Coronary Diagrams  Diagnostic Diagram     Implants     No implant documentation for this case.  MERGE Images  Link to Procedure Log   Show images for CARDIAC CATHETERIZATION Procedure Log  Hemo Data   Most Recent Value  AO Systolic Pressure 195 mmHg  AO Diastolic Pressure 48 mmHg  AO Mean 76 mmHg  LV Systolic Pressure 093 mmHg  LV Diastolic Pressure 0 mmHg  LV EDP 4 mmHg  Arterial Occlusion Pressure Extended Systolic Pressure 267 mmHg  Arterial Occlusion Pressure Extended Diastolic Pressure 47 mmHg  Arterial Occlusion Pressure Extended Mean Pressure 74 mmHg  Left Ventricular Apex Extended Systolic Pressure 124 mmHg  Left Ventricular Apex Extended Diastolic Pressure 0 mmHg  Left Ventricular Apex Extended EDP Pressure 6 mmHg    Result status: Final result                              *[place]*                   *[facility]*                         [address]  [place], [place] [address]                            [phone]  ------------------------------------------------------------------- Transthoracic Echocardiography  Patient:    Alcus, Bradly MR #:       [id number] Study Date: 04/02/2018 Gender:     M Age:        76 Height:     175.3 cm Weight:     70.8 kg BSA:        1.86 m^2 Pt. Status: Room:   ATTENDING    Gilford Raid, MD  [place], MD  [place], MD  PERFORMING   [facility], Outpatient  SONOGRAPHER  Mikki Santee  cc:  ------------------------------------------------------------------- LV EF: 65% -   70%  ------------------------------------------------------------------- Indications:      Murmur 785.2. Pre-op CABG evaluation V728.1.   ------------------------------------------------------------------- Study Conclusions  - Left ventricle: The cavity size was normal. Systolic function was   vigorous. The estimated ejection fraction was in the range of 65%   to 70%. Wall motion was normal; there were no regional wall   motion abnormalities. There was an increased relative   contribution of atrial contraction to ventricular filling.   Doppler parameters are consistent with abnormal left ventricular   relaxation (grade 1 diastolic dysfunction). - Aortic valve: There was very mild stenosis. Mean gradient (S): 9   mm Hg. Valve area (VTI): 2 cm^2. Valve area (Vmax): 1.65 cm^2.   Valve area (Vmean): 1.5 cm^2. - Mitral valve: Calcified annulus. There was mild regurgitation. - Left atrium: The atrium was mildly dilated. - Pulmonic valve: There was trivial regurgitation.  ------------------------------------------------------------------- Study data:  No prior study was available for comparison.  Study status:  Routine.  Procedure:  The patient reported no pain pre or post test. Transthoracic echocardiography. Image quality was adequate.  Study completion:  There were no complications. Transthoracic echocardiography.  M-mode, complete 2D, spectral Doppler, and color Doppler.  Birthdate:  Patient birthdate: [birth year].  Age:  Patient is 84 yr old.  Sex:  Gender: male. BMI: 23 kg/m^2.  Blood pressure:     117/62  Patient status: Outpatient.  Study date:  Study date: 04/02/2018. Study time: 01:53 PM.  Location:  Echo  laboratory.  -------------------------------------------------------------------  ------------------------------------------------------------------- Left ventricle:  The cavity size was normal. Systolic function was vigorous. The estimated ejection fraction was in the range of 65% to 70%. Wall motion was normal; there were no regional wall motion abnormalities. There was an increased relative contribution of atrial contraction to ventricular filling. Doppler parameters are consistent with abnormal left ventricular relaxation (grade 1 diastolic dysfunction).  ------------------------------------------------------------------- Aortic valve:   Trileaflet; severely thickened, severely calcified leaflets. Mobility was not restricted.  Doppler:   There was very mild stenosis.   There was no regurgitation.    VTI ratio of LVOT to aortic valve: 0.58. Valve area (VTI): 2 cm^2. Indexed valve area (VTI): 1.08 cm^2/m^2. Peak velocity ratio of LVOT to aortic valve: 0.48. Valve area (Vmax): 1.65 cm^2. Indexed valve area (Vmax): 0.89 cm^2/m^2. Mean velocity ratio of LVOT to aortic valve: 0.43. Valve area (Vmean): 1.5 cm^2. Indexed valve area (Vmean): 0.81 cm^2/m^2.   Mean gradient (S): 9 mm Hg. Peak gradient (S): 20 mm Hg.  ------------------------------------------------------------------- Aorta:  Aortic root: The aortic root was normal in size.  ------------------------------------------------------------------- Mitral valve:   Calcified annulus. Mobility was not restricted. Doppler:  Transvalvular velocity  was within the normal range. There was no evidence for stenosis. There was mild regurgitation.    Peak gradient (D): 2 mm Hg.  ------------------------------------------------------------------- Left atrium:  The atrium was mildly dilated.  ------------------------------------------------------------------- Right ventricle:  The cavity size was normal. Wall thickness was normal.  Systolic function was normal.  ------------------------------------------------------------------- Pulmonic valve:    Structurally normal valve.   Cusp separation was normal.  Doppler:  Transvalvular velocity was within the normal range. There was no evidence for stenosis. There was trivial regurgitation.  ------------------------------------------------------------------- Tricuspid valve:   Structurally normal valve.    Doppler: Transvalvular velocity was within the normal range. There was no regurgitation.  ------------------------------------------------------------------- Pulmonary artery:   The main pulmonary artery was normal-sized. Systolic pressure could not be accurately estimated.  ------------------------------------------------------------------- Right atrium:  The atrium was normal in size.  ------------------------------------------------------------------- Pericardium:  There was no pericardial effusion.  ------------------------------------------------------------------- Systemic veins: Inferior vena cava: The vessel was normal in size.  ------------------------------------------------------------------- Measurements   Left ventricle                           Value          Reference  LV ID, ED, PLAX chordal          (L)     41.7  mm       43 - 52  LV ID, ES, PLAX chordal                  25.8  mm       23 - 38  LV fx shortening, PLAX chordal           38    %        >=29  LV PW thickness, ED                      11.9  mm       ----------  IVS/LV PW ratio, ED                      0.92           <=1.3  Stroke volume, 2D                        100   ml       ----------  Stroke volume/bsa, 2D                    54    ml/m^2   ----------  LV ejection fraction, 1-p A4C            71    %        ----------  LV end-diastolic volume, 2-p             84    ml       ----------  LV end-systolic volume, 2-p              25    ml       ----------  LV ejection  fraction, 2-p                71    %        ----------  Stroke volume, 2-p                       59  ml       ----------  LV end-diastolic volume/bsa, 2-p         45    ml/m^2   ----------  LV end-systolic volume/bsa, 2-p          13    ml/m^2   ----------  Stroke volume/bsa, 2-p                   31.8  ml/m^2   ----------  LV e&', lateral                           6.84  cm/s     ----------  LV E/e&', lateral                         11.49          ----------  LV e&', medial                            5.87  cm/s     ----------  LV E/e&', medial                          13.39          ----------  LV e&', average                           6.36  cm/s     ----------  LV E/e&', average                         12.37          ----------    Ventricular septum                       Value          Reference  IVS thickness, ED                        10.9  mm       ----------    LVOT                                     Value          Reference  LVOT ID, S                               21    mm       ----------  LVOT area                                3.46  cm^2     ----------  LVOT peak velocity, S                    107   cm/s     ----------  LVOT mean velocity, S                    60.7  cm/s     ----------  LVOT VTI, S  28.8  cm       ----------  LVOT peak gradient, S                    5     mm Hg    ----------    Aortic valve                             Value          Reference  Aortic valve peak velocity, S            224   cm/s     ----------  Aortic valve mean velocity, S            140   cm/s     ----------  Aortic valve VTI, S                      49.9  cm       ----------  Aortic mean gradient, S                  9     mm Hg    ----------  Aortic peak gradient, S                  20    mm Hg    ----------  VTI ratio, LVOT/AV                       0.58           ----------  Aortic valve area, VTI                   2     cm^2     ----------  Aortic valve  area/bsa, VTI               1.08  cm^2/m^2 ----------  Velocity ratio, peak, LVOT/AV            0.48           ----------  Aortic valve area, peak velocity         1.65  cm^2     ----------  Aortic valve area/bsa, peak              0.89  cm^2/m^2 ----------  velocity  Velocity ratio, mean, LVOT/AV            0.43           ----------  Aortic valve area, mean velocity         1.5   cm^2     ----------  Aortic valve area/bsa, mean              0.81  cm^2/m^2 ----------  velocity    Aorta                                    Value          Reference  Aortic root ID, ED                       34    mm       ----------    Left atrium  Value          Reference  LA ID, A-P, ES                           35    mm       ----------  LA ID/bsa, A-P                           1.88  cm/m^2   <=2.2  LA volume, ES, 1-p A4C                   66.3  ml       ----------  LA volume/bsa, ES, 1-p A4C               35.7  ml/m^2   ----------    Mitral valve                             Value          Reference  Mitral E-wave peak velocity              78.6  cm/s     ----------  Mitral A-wave peak velocity              113   cm/s     ----------  Mitral deceleration time         (H)     338   ms       150 - 230  Mitral peak gradient, D                  2     mm Hg    ----------  Mitral E/A ratio, peak                   0.7            ----------    Right atrium                             Value          Reference  RA ID, S-I, ES, A4C              (H)     57.3  mm       34 - 49  RA area, ES, A4C                         13.6  cm^2     8.3 - 19.5  RA volume, ES, A/L                       24.5  ml       ----------  RA volume/bsa, ES, A/L                   13.2  ml/m^2   ----------    Right ventricle                          Value          Reference  TAPSE  17.5  mm       ----------  RV s&', lateral, S                        11.3  cm/s      ----------  Legend: (L)  and  (H)  mark values outside specified reference range.  ------------------------------------------------------------------- Prepared and Electronically Authenticated by  Fransico Him, MD 2019-04-12T20:17:13     Impression:   This 84 year old active gentleman has severe three-vessel coronary disease with normal left ventricular systolic function. He has recurrent substernal chest discomfort with low-level exertion while walking on his treadmill or riding his exercise bike. This is markedly limiting his quality of life according to he and his family. He has significant calcification throughout the proximal LAD and percutaneous intervention would require atherectomy and use of dual antiplatelet therapy which could become complicated in this patient with chronic thrombocytopenia. I agree that coronary bypass graft surgery is the best treatment for his coronary disease. Although he is 84 years old he is physiologically much younger and I think he would tolerate coronary bypass graft surgery with low surgical risk. His echo shows a trileaflet aortic valve that is severely thickened and calcified but with good mobility. The mean gradient is only 9 mm Hg and DVI is 0.48 with a valve area of 2 cm2. This is consistent with mild AS and does not require AVR. He does have chronic thrombocytopenia with a platelet count of 50-60,000. His last count was 76,000. We would most likely have to give him platelet transfusions at the time of surgery. After coronary bypass graft surgery I think he could be maintained on low-dose aspirin and if he developed any bleeding then discontinuation of all antiplatelet agents would still be acceptable. I have reviewed the cardiac catheterization findings with the patient and his family and answered all their questions.  I discussed the operative procedure with the patient and family including alternatives, benefits and risks; including but not limited to  bleeding, blood transfusion, infection, stroke, myocardial infarction, graft failure, heart block requiring a permanent pacemaker, organ dysfunction, and death. Hale Drone understands and agrees to proceed.    Plan:   Coronary artery bypass graft surgery   Gaye Pollack, MD  [facility]  [phone]

## 2018-04-07 NOTE — Progress Notes (Signed)
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  Per Thurmond Butts in Dr. Vivi Martens office, pt will need platelets perioperatively.  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 Vivi Martens 03/23/18 note indicates vascular surgeon Dr. Oneida Alar feels these are stable.  - Please see Dr. Vivi Martens note 03/23/18 for details of history and prior recent testing.  Testing done subsequent to that note is documented below.     VS: BP 131/62   Pulse 67   Temp 36.4 C   Resp 20   Ht 5\' 9"  (1.753 m)   Wt 155 lb 3.2 oz (70.4 kg)   SpO2 99%   BMI 22.92 kg/m   PROVIDERS: Patient Care Team: Lollie Sails, MD as Consulting Physician (Gastroenterology) - PCP is [name], MD (notes in care everywhere)  - Hematologist is Delight Hoh, MD - Vascular surgeon is [name], MD   LABS:  Dr. Cyndia Bent is aware of pt's chronic thrombocytopenia, platelet count of 39 (due to MDS).   (all labs ordered are listed, but only abnormal results are displayed)  Labs Reviewed  APTT - Abnormal; Notable for the following components:      Result Value   aPTT 37 (*)    All other components within normal limits  BLOOD GAS, ARTERIAL - Abnormal; Notable for the following components:   pH, Arterial 7.483 (*)    pO2, Arterial 117 (*)    Acid-Base Excess 2.2 (*)    All other components within normal limits  CBC - Abnormal; Notable for the following components:   WBC 3.8 (*)    RBC 4.04 (*)    Hemoglobin 11.5 (*)    HCT 34.1 (*)    Platelets 39 (*)    All other components  within normal limits  COMPREHENSIVE METABOLIC PANEL - Abnormal; Notable for the following components:   Sodium 133 (*)    Chloride 100 (*)    Glucose, Bld 117 (*)    All other components within normal limits  SURGICAL PCR SCREEN  HEMOGLOBIN A1C  PROTIME-INR  URINALYSIS, ROUTINE W REFLEX MICROSCOPIC  TYPE AND SCREEN    IMAGES: CXR 04/06/18: No acute cardiopulmonary disease.   CT angio abdomen, pelvis 02/09/18:  - VASCULAR 1. Postsurgical changes of the abdominal aorta are again noted. A pseudoaneurysm is noted at the inferior anastomotic site. The overall size of the pseudoaneurysm has not significantly changed in the interval from the prior exam. The degree of enhancement within however has increased slightly in the interval from the prior exam. This may represent some remodeling of the mural thrombus within the pseudoaneurysm. 2. Otherwise stable vascular structures when compared with the prior exam. - NON-VASCULAR 1. Chronic changes as described above.  No acute abnormality is noted.   EKG: NSR. LAFB.  RBBB   CV:  Echo 04/02/18:  - Left ventricle: The cavity size was normal. Systolic function was vigorous. The estimated ejection fraction was in the range of 65% to 70%.  Wall motion was normal; there were no regional wall motion abnormalities. There was an increased relative contribution of atrial contraction to ventricular filling. Doppler parameters are consistent with abnormal left ventricular relaxation (grade 1 diastolic dysfunction). - Aortic valve: There was very mild stenosis. Mean gradient (S): 9 mm Hg. Valve area (VTI): 2 cm^2. Valve area (Vmax): 1.65 cm^2. Valve area (Vmean): 1.5 cm^2. - Mitral valve: Calcified annulus. There was mild regurgitation. - Left atrium: The atrium was mildly dilated. - Pulmonic valve: There was trivial regurgitation.   Pre-CABG dopplers 04/02/18:  - Right Carotid: Velocities in the right ICA are consistent with a 40-59% stenosis. - Left  Carotid: Velocities in the left ICA are consistent with a 40-59% stenosis. - Vertebrals: Bilateral vertebral arteries demonstrate antegrade flow. - Right ABI: Resting right ankle-brachial index is within normal range. No evidence of significant right lower extremity arterial disease. - Left ABI: Resting left ankle-brachial index is within normal range. No evidence of significant left lower extremity arterial disease.   AAA duplex 01/28/18:  - Abdominal Aorta: The largest aortic measurement is 2.4 cm. Thrombosed pseudoaneuyrsm of the distal anastomosis measuring approximately 1.8 cm x 1.8 cm in diameter. The largest aortic diameter remains essentially unchanged compared to prior exam. Previous diameter measurement was obtained on 07/13/2017 CT   Past Medical History:  Diagnosis Date  . AAA (abdominal aortic aneurysm) ([place]) 1999  . Adenomatous colon polyp   . Aorta aneurysm ([place])   . Aortic aneurysm ([place]) 1999   repair  . Arthritis   . Basal cell cancer Dec. 24, 2013   Nose  . CAD (coronary artery disease)   . Cancer [facility]) Aug. 2013   prostate Seeding  implant  . Carotid artery occlusion   . Enlarged prostate 2008   Thermotherapy  . Gastroesophageal reflux disease   . Hyperlipidemia   . Hypertension   . Myelodysplasia (myelodysplastic syndrome) ([place])   . Pancytopenia ([place])    sees Dr. Grayland Ormond  . Peripheral vascular disease ([place])   . Psoriasis     Past Surgical History:  Procedure Laterality Date  . ABDOMINAL AORTIC ANEURYSM REPAIR  1999  . CARDIAC CATHETERIZATION  10/2009  . CHOLECYSTECTOMY    . ESOPHAGOGASTRODUODENOSCOPY (EGD) WITH PROPOFOL N/A 06/26/2016   Procedure: ESOPHAGOGASTRODUODENOSCOPY (EGD) WITH PROPOFOL;  Surgeon: Lollie Sails, MD;  Location: [facility] ENDOSCOPY;  Service: Endoscopy;  Laterality: N/A;  . LEFT HEART CATH AND CORONARY ANGIOGRAPHY N/A 03/15/2018   Procedure: LEFT HEART CATH AND CORONARY ANGIOGRAPHY;  Surgeon: Jettie Booze, MD;  Location: [place] CV LAB;  Service: Cardiovascular;  Laterality: N/A;  . PROSTATE SURGERY    . SPINE SURGERY  2009   ruptured disc  . Visceral angiogram  05/14/2012   Right  groin area  . VISCERAL ANGIOGRAM N/A 05/14/2012   Procedure: VISCERAL ANGIOGRAM;  Surgeon: Elam Dutch, MD;  Location: [facility] CATH LAB;  Service: Cardiovascular;  Laterality: N/A;   MEDICATIONS:  . fluticasone (FLONASE) 50 MCG/ACT nasal spray  . felodipine (PLENDIL) 10 MG 24 hr tablet  . Glucosamine-Chondroitin (COSAMIN DS PO)  . isosorbide mononitrate (IMDUR) 60 MG 24 hr tablet  . loratadine (CLARITIN) 10 MG tablet  . MAGNESIUM CITRATE PO  . Melatonin 500 MCG TBDP  . Multiple Vitamin (MULTIVITAMIN WITH MINERALS) TABS tablet  . oxyCODONE-acetaminophen (PERCOCET/ROXICET) 5-325 MG tablet  . polyethylene glycol (MIRALAX / GLYCOLAX) packet  . Probiotic Product (PROBIOTIC PO)  . rosuvastatin (CRESTOR) 20 MG tablet  . valsartan-hydrochlorothiazide (DIOVAN-HCT) 320-25 MG  per tablet   No current facility-administered medications for this encounter.     If no changes, I anticipate pt can proceed with surgery as scheduled.   Willeen Cass, FNP-BC [facility] Short Stay Surgical Center/Anesthesiology Phone: [phone] 04/07/2018 12:22 PM

## 2018-04-08 ENCOUNTER — Other Ambulatory Visit: Payer: Self-pay

## 2018-04-08 ENCOUNTER — Inpatient Hospital Stay (HOSPITAL_COMMUNITY): Payer: Medicare Other | Admitting: Certified Registered"

## 2018-04-08 ENCOUNTER — Ambulatory Visit (HOSPITAL_COMMUNITY): Payer: Medicare Other

## 2018-04-08 ENCOUNTER — Encounter (HOSPITAL_COMMUNITY): Payer: Self-pay | Admitting: *Deleted

## 2018-04-08 ENCOUNTER — Inpatient Hospital Stay (HOSPITAL_COMMUNITY): Payer: Medicare Other | Admitting: Emergency Medicine

## 2018-04-08 ENCOUNTER — Inpatient Hospital Stay (HOSPITAL_COMMUNITY)
Admission: RE | Admit: 2018-04-08 | Discharge: 2018-04-14 | DRG: 236 | Disposition: A | Discharge status: Home Health | Payer: Medicare Other | Source: Ambulatory Visit | Attending: Surgery | Admitting: Surgery

## 2018-04-08 ENCOUNTER — Inpatient Hospital Stay (HOSPITAL_COMMUNITY): Payer: Medicare Other

## 2018-04-08 ENCOUNTER — Inpatient Hospital Stay (HOSPITAL_COMMUNITY)
Admission: RE | Disposition: A | Discharge status: Home Health | Payer: Self-pay | Source: Ambulatory Visit | Attending: Surgery

## 2018-04-08 DIAGNOSIS — Z09 Encounter for follow-up examination after completed treatment for conditions other than malignant neoplasm: Secondary | ICD-10-CM

## 2018-04-08 DIAGNOSIS — I25110 Atherosclerotic heart disease of native coronary artery with unstable angina pectoris: Secondary | ICD-10-CM

## 2018-04-08 DIAGNOSIS — Z87891 Personal history of nicotine dependence: Secondary | ICD-10-CM

## 2018-04-08 DIAGNOSIS — I10 Essential (primary) hypertension: Secondary | ICD-10-CM | POA: Diagnosis not present

## 2018-04-08 DIAGNOSIS — I4891 Unspecified atrial fibrillation: Secondary | ICD-10-CM | POA: Diagnosis not present

## 2018-04-08 DIAGNOSIS — Z8679 Personal history of other diseases of the circulatory system: Secondary | ICD-10-CM | POA: Diagnosis not present

## 2018-04-08 DIAGNOSIS — E785 Hyperlipidemia, unspecified: Secondary | ICD-10-CM | POA: Diagnosis present

## 2018-04-08 DIAGNOSIS — D61818 Other pancytopenia: Secondary | ICD-10-CM | POA: Diagnosis not present

## 2018-04-08 DIAGNOSIS — Z85828 Personal history of other malignant neoplasm of skin: Secondary | ICD-10-CM | POA: Diagnosis not present

## 2018-04-08 DIAGNOSIS — D62 Acute posthemorrhagic anemia: Secondary | ICD-10-CM | POA: Diagnosis not present

## 2018-04-08 DIAGNOSIS — I25119 Atherosclerotic heart disease of native coronary artery with unspecified angina pectoris: Principal | ICD-10-CM | POA: Diagnosis present

## 2018-04-08 DIAGNOSIS — E8770 Fluid overload, unspecified: Secondary | ICD-10-CM | POA: Diagnosis not present

## 2018-04-08 DIAGNOSIS — Z8249 Family history of ischemic heart disease and other diseases of the circulatory system: Secondary | ICD-10-CM | POA: Diagnosis not present

## 2018-04-08 DIAGNOSIS — I2510 Atherosclerotic heart disease of native coronary artery without angina pectoris: Secondary | ICD-10-CM

## 2018-04-08 DIAGNOSIS — Z808 Family history of malignant neoplasm of other organs or systems: Secondary | ICD-10-CM | POA: Diagnosis not present

## 2018-04-08 DIAGNOSIS — K219 Gastro-esophageal reflux disease without esophagitis: Secondary | ICD-10-CM | POA: Diagnosis not present

## 2018-04-08 DIAGNOSIS — I739 Peripheral vascular disease, unspecified: Secondary | ICD-10-CM | POA: Diagnosis present

## 2018-04-08 DIAGNOSIS — Z881 Allergy status to other antibiotic agents status: Secondary | ICD-10-CM

## 2018-04-08 DIAGNOSIS — Z803 Family history of malignant neoplasm of breast: Secondary | ICD-10-CM

## 2018-04-08 DIAGNOSIS — Z951 Presence of aortocoronary bypass graft: Secondary | ICD-10-CM

## 2018-04-08 DIAGNOSIS — J9811 Atelectasis: Secondary | ICD-10-CM | POA: Diagnosis not present

## 2018-04-08 DIAGNOSIS — Z8546 Personal history of malignant neoplasm of prostate: Secondary | ICD-10-CM | POA: Diagnosis not present

## 2018-04-08 DIAGNOSIS — I440 Atrioventricular block, first degree: Secondary | ICD-10-CM | POA: Diagnosis not present

## 2018-04-08 HISTORY — PX: TEE WITHOUT CARDIOVERSION: SHX5443

## 2018-04-08 HISTORY — PX: CORONARY ARTERY BYPASS GRAFT: SHX141

## 2018-04-08 LAB — CBC
HCT: 28.6 % — ABNORMAL LOW (ref 39.0–52.0)
HCT: 29.5 % — ABNORMAL LOW (ref 39.0–52.0)
Hemoglobin: 9.5 g/dL — ABNORMAL LOW (ref 13.0–17.0)
Hemoglobin: 9.9 g/dL — ABNORMAL LOW (ref 13.0–17.0)
MCH: 28.2 pg (ref 26.0–34.0)
MCH: 28.2 pg (ref 26.0–34.0)
MCHC: 33.2 g/dL (ref 30.0–36.0)
MCHC: 33.6 g/dL (ref 30.0–36.0)
MCV: 84.9 fL (ref 78.0–100.0)
MCV: 84 fL (ref 78.0–100.0)
Platelets: 73 10*3/uL — ABNORMAL LOW (ref 150–400)
Platelets: 88 10*3/uL — ABNORMAL LOW (ref 150–400)
RBC: 3.37 MIL/uL — ABNORMAL LOW (ref 4.22–5.81)
RBC: 3.51 MIL/uL — ABNORMAL LOW (ref 4.22–5.81)
RDW: 14 % (ref 11.5–15.5)
RDW: 14.6 % (ref 11.5–15.5)
WBC: 5.8 10*3/uL (ref 4.0–10.5)
WBC: 6.9 10*3/uL (ref 4.0–10.5)

## 2018-04-08 LAB — POCT I-STAT, CHEM 8
BUN: 22 mg/dL — ABNORMAL HIGH (ref 6–20)
BUN: 23 mg/dL — ABNORMAL HIGH (ref 6–20)
BUN: 19 mg/dL (ref 6–20)
BUN: 21 mg/dL — ABNORMAL HIGH (ref 6–20)
BUN: 20 mg/dL (ref 6–20)
BUN: 18 mg/dL (ref 6–20)
BUN: 17 mg/dL (ref 6–20)
Calcium, Ion: 1.18 mmol/L (ref 1.15–1.40)
Calcium, Ion: 1.19 mmol/L (ref 1.15–1.40)
Calcium, Ion: 0.97 mmol/L — ABNORMAL LOW (ref 1.15–1.40)
Calcium, Ion: 1.21 mmol/L (ref 1.15–1.40)
Calcium, Ion: 1.02 mmol/L — ABNORMAL LOW (ref 1.15–1.40)
Calcium, Ion: 1.03 mmol/L — ABNORMAL LOW (ref 1.15–1.40)
Calcium, Ion: 1.06 mmol/L — ABNORMAL LOW (ref 1.15–1.40)
Chloride: 99 mmol/L — ABNORMAL LOW (ref 101–111)
Chloride: 99 mmol/L — ABNORMAL LOW (ref 101–111)
Chloride: 100 mmol/L — ABNORMAL LOW (ref 101–111)
Chloride: 99 mmol/L — ABNORMAL LOW (ref 101–111)
Chloride: 100 mmol/L — ABNORMAL LOW (ref 101–111)
Chloride: 99 mmol/L — ABNORMAL LOW (ref 101–111)
Chloride: 103 mmol/L (ref 101–111)
Creatinine, Ser: 0.7 mg/dL (ref 0.61–1.24)
Creatinine, Ser: 0.7 mg/dL (ref 0.61–1.24)
Creatinine, Ser: 0.7 mg/dL (ref 0.61–1.24)
Creatinine, Ser: 0.7 mg/dL (ref 0.61–1.24)
Creatinine, Ser: 0.7 mg/dL (ref 0.61–1.24)
Creatinine, Ser: 0.7 mg/dL (ref 0.61–1.24)
Creatinine, Ser: 0.7 mg/dL (ref 0.61–1.24)
Glucose, Bld: 102 mg/dL — ABNORMAL HIGH (ref 65–99)
Glucose, Bld: 102 mg/dL — ABNORMAL HIGH (ref 65–99)
Glucose, Bld: 105 mg/dL — ABNORMAL HIGH (ref 65–99)
Glucose, Bld: 107 mg/dL — ABNORMAL HIGH (ref 65–99)
Glucose, Bld: 117 mg/dL — ABNORMAL HIGH (ref 65–99)
Glucose, Bld: 120 mg/dL — ABNORMAL HIGH (ref 65–99)
Glucose, Bld: 156 mg/dL — ABNORMAL HIGH (ref 65–99)
HCT: 23 % — ABNORMAL LOW (ref 39.0–52.0)
HCT: 24 % — ABNORMAL LOW (ref 39.0–52.0)
HCT: 20 % — ABNORMAL LOW (ref 39.0–52.0)
HCT: 22 % — ABNORMAL LOW (ref 39.0–52.0)
HCT: 25 % — ABNORMAL LOW (ref 39.0–52.0)
HCT: 22 % — ABNORMAL LOW (ref 39.0–52.0)
HCT: 27 % — ABNORMAL LOW (ref 39.0–52.0)
Hemoglobin: 7.8 g/dL — ABNORMAL LOW (ref 13.0–17.0)
Hemoglobin: 8.2 g/dL — ABNORMAL LOW (ref 13.0–17.0)
Hemoglobin: 6.8 g/dL — CL (ref 13.0–17.0)
Hemoglobin: 7.5 g/dL — ABNORMAL LOW (ref 13.0–17.0)
Hemoglobin: 8.5 g/dL — ABNORMAL LOW (ref 13.0–17.0)
Hemoglobin: 7.5 g/dL — ABNORMAL LOW (ref 13.0–17.0)
Hemoglobin: 9.2 g/dL — ABNORMAL LOW (ref 13.0–17.0)
Potassium: 3.2 mmol/L — ABNORMAL LOW (ref 3.5–5.1)
Potassium: 3.2 mmol/L — ABNORMAL LOW (ref 3.5–5.1)
Potassium: 3.3 mmol/L — ABNORMAL LOW (ref 3.5–5.1)
Potassium: 5.5 mmol/L — ABNORMAL HIGH (ref 3.5–5.1)
Potassium: 4.4 mmol/L (ref 3.5–5.1)
Potassium: 3.7 mmol/L (ref 3.5–5.1)
Potassium: 3.8 mmol/L (ref 3.5–5.1)
Sodium: 137 mmol/L (ref 135–145)
Sodium: 137 mmol/L (ref 135–145)
Sodium: 136 mmol/L (ref 135–145)
Sodium: 137 mmol/L (ref 135–145)
Sodium: 137 mmol/L (ref 135–145)
Sodium: 138 mmol/L (ref 135–145)
Sodium: 139 mmol/L (ref 135–145)
TCO2: 28 mmol/L (ref 22–32)
TCO2: 30 mmol/L (ref 22–32)
TCO2: 28 mmol/L (ref 22–32)
TCO2: 30 mmol/L (ref 22–32)
TCO2: 27 mmol/L (ref 22–32)
TCO2: 26 mmol/L (ref 22–32)
TCO2: 22 mmol/L (ref 22–32)

## 2018-04-08 LAB — POCT I-STAT 3, ART BLOOD GAS (G3+)
Acid-Base Excess: 2 mmol/L (ref 0.0–2.0)
Acid-Base Excess: 2 mmol/L (ref 0.0–2.0)
Acid-Base Excess: 2 mmol/L (ref 0.0–2.0)
Acid-Base Excess: 3 mmol/L — ABNORMAL HIGH (ref 0.0–2.0)
Acid-Base Excess: 1 mmol/L (ref 0.0–2.0)
Acid-base deficit: 1 mmol/L (ref 0.0–2.0)
Acid-base deficit: 4 mmol/L — ABNORMAL HIGH (ref 0.0–2.0)
Bicarbonate: 26.7 mmol/L (ref 20.0–28.0)
Bicarbonate: 25.8 mmol/L (ref 20.0–28.0)
Bicarbonate: 25.9 mmol/L (ref 20.0–28.0)
Bicarbonate: 26 mmol/L (ref 20.0–28.0)
Bicarbonate: 24.9 mmol/L (ref 20.0–28.0)
Bicarbonate: 24.3 mmol/L (ref 20.0–28.0)
Bicarbonate: 21.6 mmol/L (ref 20.0–28.0)
Bicarbonate: 21.8 mmol/L (ref 20.0–28.0)
O2 Saturation: 100 %
O2 Saturation: 100 %
O2 Saturation: 100 %
O2 Saturation: 100 %
O2 Saturation: 100 %
O2 Saturation: 100 %
O2 Saturation: 100 %
O2 Saturation: 95 %
Patient temperature: 35.4
Patient temperature: 36.8
Patient temperature: 36.9
TCO2: 28 mmol/L (ref 22–32)
TCO2: 27 mmol/L (ref 22–32)
TCO2: 27 mmol/L (ref 22–32)
TCO2: 27 mmol/L (ref 22–32)
TCO2: 26 mmol/L (ref 22–32)
TCO2: 25 mmol/L (ref 22–32)
TCO2: 22 mmol/L (ref 22–32)
TCO2: 23 mmol/L (ref 22–32)
pCO2 arterial: 44.2 mmHg (ref 32.0–48.0)
pCO2 arterial: 33.6 mmHg (ref 32.0–48.0)
pCO2 arterial: 35.4 mmHg (ref 32.0–48.0)
pCO2 arterial: 37.8 mmHg (ref 32.0–48.0)
pCO2 arterial: 34.3 mmHg (ref 32.0–48.0)
pCO2 arterial: 33.8 mmHg (ref 32.0–48.0)
pCO2 arterial: 26.5 mmHg — ABNORMAL LOW (ref 32.0–48.0)
pCO2 arterial: 39.6 mmHg (ref 32.0–48.0)
pH, Arterial: 7.388 (ref 7.350–7.450)
pH, Arterial: 7.445 (ref 7.350–7.450)
pH, Arterial: 7.473 — ABNORMAL HIGH (ref 7.350–7.450)
pH, Arterial: 7.494 — ABNORMAL HIGH (ref 7.350–7.450)
pH, Arterial: 7.47 — ABNORMAL HIGH (ref 7.350–7.450)
pH, Arterial: 7.459 — ABNORMAL HIGH (ref 7.350–7.450)
pH, Arterial: 7.519 — ABNORMAL HIGH (ref 7.350–7.450)
pH, Arterial: 7.348 — ABNORMAL LOW (ref 7.350–7.450)
pO2, Arterial: 378 mmHg — ABNORMAL HIGH (ref 83.0–108.0)
pO2, Arterial: 299 mmHg — ABNORMAL HIGH (ref 83.0–108.0)
pO2, Arterial: 320 mmHg — ABNORMAL HIGH (ref 83.0–108.0)
pO2, Arterial: 361 mmHg — ABNORMAL HIGH (ref 83.0–108.0)
pO2, Arterial: 306 mmHg — ABNORMAL HIGH (ref 83.0–108.0)
pO2, Arterial: 164 mmHg — ABNORMAL HIGH (ref 83.0–108.0)
pO2, Arterial: 189 mmHg — ABNORMAL HIGH (ref 83.0–108.0)
pO2, Arterial: 78 mmHg — ABNORMAL LOW (ref 83.0–108.0)

## 2018-04-08 LAB — FIBRINOGEN: Fibrinogen: 185 mg/dL — ABNORMAL LOW (ref 210–475)

## 2018-04-08 LAB — GLUCOSE, CAPILLARY
Glucose-Capillary: 105 mg/dL — ABNORMAL HIGH (ref 65–99)
Glucose-Capillary: 95 mg/dL (ref 65–99)
Glucose-Capillary: 126 mg/dL — ABNORMAL HIGH (ref 65–99)
Glucose-Capillary: 170 mg/dL — ABNORMAL HIGH (ref 65–99)
Glucose-Capillary: 120 mg/dL — ABNORMAL HIGH (ref 65–99)

## 2018-04-08 LAB — PREPARE RBC (CROSSMATCH)

## 2018-04-08 LAB — POCT I-STAT 4, (NA,K, GLUC, HGB,HCT)
Glucose, Bld: 99 mg/dL (ref 65–99)
HCT: 26 % — ABNORMAL LOW (ref 39.0–52.0)
Hemoglobin: 8.8 g/dL — ABNORMAL LOW (ref 13.0–17.0)
Potassium: 3.1 mmol/L — ABNORMAL LOW (ref 3.5–5.1)
Sodium: 141 mmol/L (ref 135–145)

## 2018-04-08 LAB — MAGNESIUM: Magnesium: 3.3 mg/dL — ABNORMAL HIGH (ref 1.7–2.4)

## 2018-04-08 LAB — HEMOGLOBIN AND HEMATOCRIT, BLOOD
HCT: 27.2 % — ABNORMAL LOW (ref 39.0–52.0)
Hemoglobin: 9.1 g/dL — ABNORMAL LOW (ref 13.0–17.0)

## 2018-04-08 LAB — APTT: aPTT: 37 seconds — ABNORMAL HIGH (ref 24–36)

## 2018-04-08 LAB — CREATININE, SERUM
Creatinine, Ser: 0.82 mg/dL (ref 0.61–1.24)
GFR calc Af Amer: >60 mL/min (ref >60)
GFR calc non Af Amer: >60 mL/min (ref >60)

## 2018-04-08 LAB — PROTIME-INR
INR: 1.47
Prothrombin Time: 17.7 seconds — ABNORMAL HIGH (ref 11.4–15.2)

## 2018-04-08 LAB — ECHO TEE
AV Mean grad: 7 mmHg
AV Peak grad: 15 mmHg
VTI: 45 cm
Velocity Ratio: 0.33

## 2018-04-08 LAB — PLATELET COUNT: Platelets: 22 10*3/uL — CL (ref 150–400)

## 2018-04-08 SURGERY — CORONARY ARTERY BYPASS GRAFTING (CABG)
Anesthesia: General | Site: Chest

## 2018-04-08 MED ORDER — POTASSIUM CHLORIDE 10 MEQ/50ML IV SOLN
10.0000 meq | INTRAVENOUS | Status: AC
  Administered 2018-04-08 (×2): 10 meq via INTRAVENOUS
  Filled 2018-04-08 (×2): qty 50

## 2018-04-08 MED ORDER — ACETAMINOPHEN 160 MG/5ML PO SOLN: 1000.0000 mg | Freq: Four times a day (QID) | ORAL | Status: DC

## 2018-04-08 MED ORDER — DEXAMETHASONE SODIUM PHOSPHATE 10 MG/ML IJ SOLN
INTRAMUSCULAR | Status: DC | PRN
  Administered 2018-04-08: 4 mg via INTRAVENOUS

## 2018-04-08 MED ORDER — CHLORHEXIDINE GLUCONATE 4 % EX LIQD: 30.0000 mL | CUTANEOUS | Status: DC

## 2018-04-08 MED ORDER — ROCURONIUM BROMIDE 10 MG/ML (PF) SYRINGE
PREFILLED_SYRINGE | INTRAVENOUS | Status: DC | PRN
  Administered 2018-04-08 (×2): 100 mg via INTRAVENOUS

## 2018-04-08 MED ORDER — METOPROLOL TARTRATE 12.5 MG HALF TABLET
12.5000 mg | ORAL_TABLET | Freq: Two times a day (BID) | ORAL | Status: DC
  Administered 2018-04-09 – 2018-04-14 (×11): 12.5 mg via ORAL
  Filled 2018-04-08 (×11): qty 1

## 2018-04-08 MED ORDER — OXYCODONE HCL 5 MG PO TABS
5.0000 mg | ORAL_TABLET | ORAL | Status: DC | PRN
  Administered 2018-04-08 – 2018-04-09 (×2): 10 mg via ORAL
  Administered 2018-04-09: 5 mg via ORAL
  Administered 2018-04-10 – 2018-04-13 (×5): 10 mg via ORAL
  Filled 2018-04-08 (×3): qty 2
  Filled 2018-04-08: qty 1
  Filled 2018-04-08 (×4): qty 2

## 2018-04-08 MED ORDER — KETOROLAC TROMETHAMINE 15 MG/ML IJ SOLN
15.0000 mg | Freq: Four times a day (QID) | INTRAMUSCULAR | Status: AC
  Administered 2018-04-08 – 2018-04-09 (×3): 15 mg via INTRAVENOUS
  Filled 2018-04-08 (×3): qty 1

## 2018-04-08 MED ORDER — TRAMADOL HCL 50 MG PO TABS
50.0000 mg | ORAL_TABLET | ORAL | Status: DC | PRN
  Administered 2018-04-09 – 2018-04-10 (×2): 100 mg via ORAL
  Filled 2018-04-08 (×3): qty 2

## 2018-04-08 MED ORDER — FENTANYL CITRATE (PF) 250 MCG/5ML IJ SOLN
INTRAMUSCULAR | Status: DC | PRN
  Administered 2018-04-08: 100 ug via INTRAVENOUS
  Administered 2018-04-08: 225 ug via INTRAVENOUS
  Administered 2018-04-08: 100 ug via INTRAVENOUS
  Administered 2018-04-08: 25 ug via INTRAVENOUS
  Administered 2018-04-08: 250 ug via INTRAVENOUS

## 2018-04-08 MED ORDER — FENTANYL CITRATE (PF) 250 MCG/5ML IJ SOLN
INTRAMUSCULAR | Status: AC
  Filled 2018-04-08: qty 25

## 2018-04-08 MED ORDER — DEXAMETHASONE SODIUM PHOSPHATE 10 MG/ML IJ SOLN
INTRAMUSCULAR | Status: AC
  Filled 2018-04-08: qty 1

## 2018-04-08 MED ORDER — SODIUM CHLORIDE 0.9 % IV SOLN
1.5000 g | Freq: Two times a day (BID) | INTRAVENOUS | Status: AC
  Administered 2018-04-08 – 2018-04-10 (×4): 1.5 g via INTRAVENOUS
  Filled 2018-04-08 (×4): qty 1.5

## 2018-04-08 MED ORDER — SODIUM CHLORIDE 0.9 % IV SOLN
INTRAVENOUS | Status: DC | PRN
  Administered 2018-04-08: 750 mg via INTRAVENOUS

## 2018-04-08 MED ORDER — ACETAMINOPHEN 650 MG RE SUPP
650.0000 mg | Freq: Once | RECTAL | Status: AC
  Administered 2018-04-08: 650 mg via RECTAL

## 2018-04-08 MED ORDER — SODIUM CHLORIDE 0.9 % IV SOLN
0.0000 ug/min | INTRAVENOUS | Status: DC
  Filled 2018-04-08: qty 2

## 2018-04-08 MED ORDER — FAMOTIDINE IN NACL 20-0.9 MG/50ML-% IV SOLN
20.0000 mg | Freq: Two times a day (BID) | INTRAVENOUS | Status: AC
  Administered 2018-04-08: 20 mg via INTRAVENOUS

## 2018-04-08 MED ORDER — MIDAZOLAM HCL 5 MG/5ML IJ SOLN
INTRAMUSCULAR | Status: DC | PRN
  Administered 2018-04-08: 3 mg via INTRAVENOUS
  Administered 2018-04-08: .5 mg via INTRAVENOUS
  Administered 2018-04-08: 2 mg via INTRAVENOUS
  Administered 2018-04-08: 2.5 mg via INTRAVENOUS
  Administered 2018-04-08: 2 mg via INTRAVENOUS

## 2018-04-08 MED ORDER — SODIUM CHLORIDE 0.9 % IV SOLN
Freq: Once | INTRAVENOUS | Status: AC
  Administered 2018-04-08: 19:00:00 via INTRAVENOUS

## 2018-04-08 MED ORDER — THROMBIN (RECOMBINANT) 20000 UNITS EX SOLR
CUTANEOUS | Status: DC | PRN
  Administered 2018-04-08: 20000 [IU] via TOPICAL

## 2018-04-08 MED ORDER — SODIUM CHLORIDE 0.45 % IV SOLN
INTRAVENOUS | Status: DC | PRN
  Administered 2018-04-08: 16:00:00 via INTRAVENOUS

## 2018-04-08 MED ORDER — PROPOFOL 10 MG/ML IV BOLUS
INTRAVENOUS | Status: AC
  Filled 2018-04-08: qty 20

## 2018-04-08 MED ORDER — SODIUM CHLORIDE 0.9 % IV SOLN
INTRAVENOUS | Status: DC
  Filled 2018-04-08: qty 1

## 2018-04-08 MED ORDER — DIPHENHYDRAMINE HCL 50 MG/ML IJ SOLN
INTRAMUSCULAR | Status: AC
  Filled 2018-04-08: qty 1

## 2018-04-08 MED ORDER — CHLORHEXIDINE GLUCONATE 0.12 % MT SOLN
15.0000 mL | Freq: Once | OROMUCOSAL | Status: AC
  Administered 2018-04-08: 15 mL via OROMUCOSAL
  Filled 2018-04-08: qty 15

## 2018-04-08 MED ORDER — GLYCOPYRROLATE 0.2 MG/ML IV SOSY
PREFILLED_SYRINGE | INTRAVENOUS | Status: DC | PRN
  Administered 2018-04-08: .1 mg via INTRAVENOUS

## 2018-04-08 MED ORDER — ONDANSETRON HCL 4 MG/2ML IJ SOLN
4.0000 mg | Freq: Four times a day (QID) | INTRAMUSCULAR | Status: DC | PRN
  Administered 2018-04-11: 4 mg via INTRAVENOUS
  Filled 2018-04-08: qty 2

## 2018-04-08 MED ORDER — SODIUM CHLORIDE 0.9% FLUSH
3.0000 mL | Freq: Two times a day (BID) | INTRAVENOUS | Status: DC
  Administered 2018-04-09: 10 mL via INTRAVENOUS
  Administered 2018-04-09: 3 mL via INTRAVENOUS
  Administered 2018-04-09: 10 mL via INTRAVENOUS
  Administered 2018-04-10 – 2018-04-13 (×7): 3 mL via INTRAVENOUS

## 2018-04-08 MED ORDER — ONDANSETRON HCL 4 MG/2ML IJ SOLN
INTRAMUSCULAR | Status: AC
  Filled 2018-04-08: qty 2

## 2018-04-08 MED ORDER — NITROGLYCERIN IN D5W 200-5 MCG/ML-% IV SOLN: 0.0000 ug/min | INTRAVENOUS | Status: DC

## 2018-04-08 MED ORDER — THROMBIN (RECOMBINANT) 20000 UNITS EX SOLR
CUTANEOUS | Status: DC | PRN
  Administered 2018-04-08 (×3): 4 mL via TOPICAL

## 2018-04-08 MED ORDER — PHENYLEPHRINE HCL 10 MG/ML IJ SOLN: INTRAVENOUS | Status: DC | PRN

## 2018-04-08 MED ORDER — ALBUMIN HUMAN 5 % IV SOLN
250.0000 mL | INTRAVENOUS | Status: AC | PRN
  Administered 2018-04-08 (×3): 250 mL via INTRAVENOUS
  Filled 2018-04-08: qty 250

## 2018-04-08 MED ORDER — POTASSIUM CHLORIDE 10 MEQ/50ML IV SOLN
10.0000 meq | INTRAVENOUS | Status: AC
  Administered 2018-04-08 (×3): 10 meq via INTRAVENOUS

## 2018-04-08 MED ORDER — CHLORHEXIDINE GLUCONATE 0.12 % MT SOLN
15.0000 mL | OROMUCOSAL | Status: AC
  Administered 2018-04-08: 15 mL via OROMUCOSAL

## 2018-04-08 MED ORDER — ROSUVASTATIN CALCIUM 10 MG PO TABS
20.0000 mg | ORAL_TABLET | Freq: Every day | ORAL | Status: DC
  Administered 2018-04-09 – 2018-04-13 (×5): 20 mg via ORAL
  Filled 2018-04-08 (×3): qty 2
  Filled 2018-04-08: qty 1
  Filled 2018-04-08: qty 2

## 2018-04-08 MED ORDER — VANCOMYCIN HCL IN DEXTROSE 1-5 GM/200ML-% IV SOLN
1000.0000 mg | Freq: Once | INTRAVENOUS | Status: AC
  Administered 2018-04-08: 1000 mg via INTRAVENOUS
  Filled 2018-04-08: qty 200

## 2018-04-08 MED ORDER — METOPROLOL TARTRATE 12.5 MG HALF TABLET
12.5000 mg | ORAL_TABLET | Freq: Once | ORAL | Status: AC
  Administered 2018-04-08: 12.5 mg via ORAL
  Filled 2018-04-08: qty 1

## 2018-04-08 MED ORDER — BISACODYL 5 MG PO TBEC
10.0000 mg | DELAYED_RELEASE_TABLET | Freq: Every day | ORAL | Status: DC
  Administered 2018-04-09 – 2018-04-11 (×3): 10 mg via ORAL
  Filled 2018-04-08 (×4): qty 2

## 2018-04-08 MED ORDER — SODIUM CHLORIDE 0.9 % IV SOLN
Freq: Once | INTRAVENOUS | Status: AC
  Administered 2018-04-08: 11:00:00 via INTRAVENOUS

## 2018-04-08 MED ORDER — 0.9 % SODIUM CHLORIDE (POUR BTL) OPTIME
TOPICAL | Status: DC | PRN
  Administered 2018-04-08: 6000 mL

## 2018-04-08 MED ORDER — SODIUM CHLORIDE 0.9 % IV SOLN: 250.0000 mL | INTRAVENOUS | Status: DC

## 2018-04-08 MED ORDER — PHENYLEPHRINE HCL 10 MG/ML IJ SOLN
INTRAVENOUS | Status: DC | PRN
  Administered 2018-04-08: 10 ug/min via INTRAVENOUS

## 2018-04-08 MED ORDER — ALBUMIN HUMAN 5 % IV SOLN
INTRAVENOUS | Status: DC | PRN
  Administered 2018-04-08: 11:00:00 via INTRAVENOUS

## 2018-04-08 MED ORDER — ASPIRIN EC 325 MG PO TBEC
325.0000 mg | DELAYED_RELEASE_TABLET | Freq: Every day | ORAL | Status: DC
  Administered 2018-04-09 – 2018-04-10 (×2): 325 mg via ORAL
  Filled 2018-04-08 (×2): qty 1

## 2018-04-08 MED ORDER — DIPHENHYDRAMINE HCL 50 MG/ML IJ SOLN
INTRAMUSCULAR | Status: DC | PRN
  Administered 2018-04-08: 12.5 mg via INTRAVENOUS

## 2018-04-08 MED ORDER — HEPARIN SODIUM (PORCINE) 1000 UNIT/ML IJ SOLN
INTRAMUSCULAR | Status: DC | PRN
  Administered 2018-04-08: 22000 [IU] via INTRAVENOUS

## 2018-04-08 MED ORDER — ACETAMINOPHEN 160 MG/5ML PO SOLN: 650.0000 mg | Freq: Once | ORAL | Status: AC

## 2018-04-08 MED ORDER — MORPHINE SULFATE (PF) 2 MG/ML IV SOLN
2.0000 mg | INTRAVENOUS | Status: DC | PRN
  Administered 2018-04-09: 3 mg via INTRAVENOUS
  Administered 2018-04-09: 4 mg via INTRAVENOUS
  Administered 2018-04-11: 2 mg via INTRAVENOUS
  Administered 2018-04-14: 4 mg via INTRAVENOUS
  Filled 2018-04-08: qty 2
  Filled 2018-04-08 (×2): qty 1
  Filled 2018-04-08: qty 2
  Filled 2018-04-08: qty 1

## 2018-04-08 MED ORDER — ORAL CARE MOUTH RINSE
15.0000 mL | Freq: Two times a day (BID) | OROMUCOSAL | Status: DC
  Administered 2018-04-08 – 2018-04-13 (×8): 15 mL via OROMUCOSAL

## 2018-04-08 MED ORDER — PANTOPRAZOLE SODIUM 40 MG PO TBEC
40.0000 mg | DELAYED_RELEASE_TABLET | Freq: Every day | ORAL | Status: DC
  Administered 2018-04-10 – 2018-04-11 (×2): 40 mg via ORAL
  Filled 2018-04-08 (×2): qty 1

## 2018-04-08 MED ORDER — HEMOSTATIC AGENTS (NO CHARGE) OPTIME
TOPICAL | Status: DC | PRN
  Administered 2018-04-08: 1 via TOPICAL

## 2018-04-08 MED ORDER — PROPOFOL 10 MG/ML IV BOLUS
INTRAVENOUS | Status: DC | PRN
  Administered 2018-04-08: 90 mg via INTRAVENOUS

## 2018-04-08 MED ORDER — ACETAMINOPHEN 500 MG PO TABS
1000.0000 mg | ORAL_TABLET | Freq: Four times a day (QID) | ORAL | Status: AC
  Administered 2018-04-09 – 2018-04-13 (×11): 1000 mg via ORAL
  Filled 2018-04-08 (×12): qty 2

## 2018-04-08 MED ORDER — LACTATED RINGERS IV SOLN
INTRAVENOUS | Status: DC | PRN
  Administered 2018-04-08: 07:00:00 via INTRAVENOUS

## 2018-04-08 MED ORDER — ONDANSETRON HCL 4 MG/2ML IJ SOLN
INTRAMUSCULAR | Status: DC | PRN
  Administered 2018-04-08: 4 mg via INTRAVENOUS

## 2018-04-08 MED ORDER — MORPHINE SULFATE (PF) 2 MG/ML IV SOLN
1.0000 mg | INTRAVENOUS | Status: AC | PRN
  Administered 2018-04-08 (×2): 2 mg via INTRAVENOUS
  Filled 2018-04-08: qty 2

## 2018-04-08 MED ORDER — ASPIRIN 81 MG PO CHEW: 324.0000 mg | CHEWABLE_TABLET | Freq: Every day | ORAL | Status: DC

## 2018-04-08 MED ORDER — MAGNESIUM SULFATE 4 GM/100ML IV SOLN
4.0000 g | Freq: Once | INTRAVENOUS | Status: AC
  Administered 2018-04-08: 4 g via INTRAVENOUS
  Filled 2018-04-08: qty 100

## 2018-04-08 MED ORDER — INSULIN ASPART 100 UNIT/ML ~~LOC~~ SOLN
0.0000 [IU] | SUBCUTANEOUS | Status: DC
  Administered 2018-04-08: 4 [IU] via SUBCUTANEOUS
  Administered 2018-04-08: 2 [IU] via SUBCUTANEOUS

## 2018-04-08 MED ORDER — DOCUSATE SODIUM 100 MG PO CAPS
200.0000 mg | ORAL_CAPSULE | Freq: Every day | ORAL | Status: DC
  Administered 2018-04-09 – 2018-04-14 (×5): 200 mg via ORAL
  Filled 2018-04-08 (×5): qty 2

## 2018-04-08 MED ORDER — MIDAZOLAM HCL 10 MG/2ML IJ SOLN
INTRAMUSCULAR | Status: AC
  Filled 2018-04-08: qty 2

## 2018-04-08 MED ORDER — LACTATED RINGERS IV SOLN: INTRAVENOUS | Status: DC

## 2018-04-08 MED ORDER — LACTATED RINGERS IV SOLN: 500.0000 mL | Freq: Once | INTRAVENOUS | Status: DC | PRN

## 2018-04-08 MED ORDER — INSULIN REGULAR BOLUS VIA INFUSION
0.0000 [IU] | Freq: Three times a day (TID) | INTRAVENOUS | Status: DC
  Filled 2018-04-08: qty 10

## 2018-04-08 MED ORDER — METOPROLOL TARTRATE 25 MG/10 ML ORAL SUSPENSION: 12.5000 mg | Freq: Two times a day (BID) | ORAL | Status: DC

## 2018-04-08 MED ORDER — DEXMEDETOMIDINE HCL IN NACL 200 MCG/50ML IV SOLN: 0.0000 ug/kg/h | INTRAVENOUS | Status: DC

## 2018-04-08 MED ORDER — MIDAZOLAM HCL 2 MG/2ML IJ SOLN: 2.0000 mg | INTRAMUSCULAR | Status: DC | PRN

## 2018-04-08 MED ORDER — SODIUM CHLORIDE 0.9% FLUSH: 3.0000 mL | INTRAVENOUS | Status: DC | PRN

## 2018-04-08 MED ORDER — SODIUM CHLORIDE 0.9 % IV SOLN
INTRAVENOUS | Status: DC
  Administered 2018-04-09: 06:00:00 via INTRAVENOUS

## 2018-04-08 MED ORDER — THROMBIN 20000 UNITS EX SOLR
CUTANEOUS | Status: AC
  Filled 2018-04-08: qty 20000

## 2018-04-08 MED ORDER — METOPROLOL TARTRATE 5 MG/5ML IV SOLN
2.5000 mg | INTRAVENOUS | Status: DC | PRN
  Administered 2018-04-13: 5 mg via INTRAVENOUS
  Filled 2018-04-08: qty 5

## 2018-04-08 MED ORDER — PROTAMINE SULFATE 10 MG/ML IV SOLN
INTRAVENOUS | Status: DC | PRN
  Administered 2018-04-08: 200 mg via INTRAVENOUS

## 2018-04-08 MED ORDER — BISACODYL 10 MG RE SUPP: 10.0000 mg | Freq: Every day | RECTAL | Status: DC

## 2018-04-08 SURGICAL SUPPLY — 105 items
BAG DECANTER FOR FLEXI CONT (MISCELLANEOUS) ×4 IMPLANT
BANDAGE ACE 4X5 VEL STRL LF (GAUZE/BANDAGES/DRESSINGS) ×4 IMPLANT
BANDAGE ACE 6X5 VEL STRL LF (GAUZE/BANDAGES/DRESSINGS) ×4 IMPLANT
BASKET HEART  (ORDER IN 25'S) (MISCELLANEOUS) ×1
BASKET HEART (ORDER IN 25'S) (MISCELLANEOUS) ×1
BASKET HEART (ORDER IN 25S) (MISCELLANEOUS) ×2 IMPLANT
BLADE STERNUM SYSTEM 6 (BLADE) ×4 IMPLANT
BLADE SURG 11 STRL SS (BLADE) ×4 IMPLANT
BNDG GAUZE ELAST 4 BULKY (GAUZE/BANDAGES/DRESSINGS) ×4 IMPLANT
CANISTER SUCT 3000ML PPV (MISCELLANEOUS) ×4 IMPLANT
CATH ROBINSON RED A/P 18FR (CATHETERS) ×8 IMPLANT
CATH THORACIC 28FR (CATHETERS) ×4 IMPLANT
CATH THORACIC 36FR (CATHETERS) ×4 IMPLANT
CATH THORACIC 36FR RT ANG (CATHETERS) ×4 IMPLANT
CLIP VESOCCLUDE MED 24/CT (CLIP) IMPLANT
CLIP VESOCCLUDE SM WIDE 24/CT (CLIP) IMPLANT
CRADLE DONUT ADULT HEAD (MISCELLANEOUS) ×4 IMPLANT
DERMABOND ADVANCED (GAUZE/BANDAGES/DRESSINGS) ×2
DERMABOND ADVANCED .7 DNX12 (GAUZE/BANDAGES/DRESSINGS) ×2 IMPLANT
DRAPE CARDIOVASCULAR INCISE (DRAPES) ×2
DRAPE SLUSH/WARMER DISC (DRAPES) ×4 IMPLANT
DRAPE SRG 135X102X78XABS (DRAPES) ×2 IMPLANT
DRSG AQUACEL AG ADV 3.5X14 (GAUZE/BANDAGES/DRESSINGS) ×4 IMPLANT
DRSG COVADERM 4X14 (GAUZE/BANDAGES/DRESSINGS) ×4 IMPLANT
ELECT CAUTERY BLADE 6.4 (BLADE) ×4 IMPLANT
ELECT REM PT RETURN 9FT ADLT (ELECTROSURGICAL) ×8
ELECTRODE REM PT RTRN 9FT ADLT (ELECTROSURGICAL) ×4 IMPLANT
FELT TEFLON 1X6 (MISCELLANEOUS) ×8 IMPLANT
GAUZE SPONGE 4X4 12PLY STRL (GAUZE/BANDAGES/DRESSINGS) ×8 IMPLANT
GAUZE SPONGE 4X4 12PLY STRL LF (GAUZE/BANDAGES/DRESSINGS) ×8 IMPLANT
GLOVE BIO SURGEON STRL SZ 6 (GLOVE) IMPLANT
GLOVE BIO SURGEON STRL SZ 6.5 (GLOVE) IMPLANT
GLOVE BIO SURGEON STRL SZ7 (GLOVE) ×16 IMPLANT
GLOVE BIO SURGEON STRL SZ7.5 (GLOVE) IMPLANT
GLOVE BIO SURGEONS STRL SZ 6.5 (GLOVE)
GLOVE BIOGEL PI IND STRL 6 (GLOVE) ×2 IMPLANT
GLOVE BIOGEL PI IND STRL 6.5 (GLOVE) ×2 IMPLANT
GLOVE BIOGEL PI IND STRL 7.0 (GLOVE) ×4 IMPLANT
GLOVE BIOGEL PI INDICATOR 6 (GLOVE) ×2
GLOVE BIOGEL PI INDICATOR 6.5 (GLOVE) ×2
GLOVE BIOGEL PI INDICATOR 7.0 (GLOVE) ×4
GLOVE EUDERMIC 7 POWDERFREE (GLOVE) ×16 IMPLANT
GLOVE ORTHO TXT STRL SZ7.5 (GLOVE) IMPLANT
GLOVE SURG SS PI 6.0 STRL IVOR (GLOVE) ×12 IMPLANT
GOWN STRL REUS W/ TWL LRG LVL3 (GOWN DISPOSABLE) ×12 IMPLANT
GOWN STRL REUS W/ TWL XL LVL3 (GOWN DISPOSABLE) ×4 IMPLANT
GOWN STRL REUS W/TWL LRG LVL3 (GOWN DISPOSABLE) ×12
GOWN STRL REUS W/TWL XL LVL3 (GOWN DISPOSABLE) ×4
HEMOSTAT POWDER SURGIFOAM 1G (HEMOSTASIS) ×12 IMPLANT
HEMOSTAT SURGICEL 2X14 (HEMOSTASIS) ×4 IMPLANT
INSERT FOGARTY 61MM (MISCELLANEOUS) IMPLANT
INSERT FOGARTY XLG (MISCELLANEOUS) IMPLANT
KIT BASIN OR (CUSTOM PROCEDURE TRAY) ×4 IMPLANT
KIT CATH CPB BARTLE (MISCELLANEOUS) ×4 IMPLANT
KIT SUCTION CATH 14FR (SUCTIONS) ×4 IMPLANT
KIT TURNOVER KIT B (KITS) ×4 IMPLANT
KIT VASOVIEW HEMOPRO VH 3000 (KITS) ×4 IMPLANT
NS IRRIG 1000ML POUR BTL (IV SOLUTION) ×24 IMPLANT
PACK E OPEN HEART (SUTURE) ×4 IMPLANT
PACK OPEN HEART (CUSTOM PROCEDURE TRAY) ×4 IMPLANT
PAD ARMBOARD 7.5X6 YLW CONV (MISCELLANEOUS) ×8 IMPLANT
PAD ELECT DEFIB RADIOL ZOLL (MISCELLANEOUS) ×4 IMPLANT
PENCIL BUTTON HOLSTER BLD 10FT (ELECTRODE) ×4 IMPLANT
PUNCH AORTIC ROTATE 4.0MM (MISCELLANEOUS) IMPLANT
PUNCH AORTIC ROTATE 4.5MM 8IN (MISCELLANEOUS) ×4 IMPLANT
PUNCH AORTIC ROTATE 5MM 8IN (MISCELLANEOUS) IMPLANT
SET CARDIOPLEGIA MPS 5001102 (MISCELLANEOUS) ×4 IMPLANT
SOLUTION ANTI FOG 6CC (MISCELLANEOUS) ×4 IMPLANT
SPONGE INTESTINAL PEANUT (DISPOSABLE) IMPLANT
SPONGE LAP 18X18 X RAY DECT (DISPOSABLE) ×4 IMPLANT
SPONGE LAP 4X18 X RAY DECT (DISPOSABLE) ×8 IMPLANT
SUT BONE WAX W31G (SUTURE) ×4 IMPLANT
SUT MNCRL AB 4-0 PS2 18 (SUTURE) ×4 IMPLANT
SUT PROLENE 3 0 SH DA (SUTURE) IMPLANT
SUT PROLENE 3 0 SH1 36 (SUTURE) ×4 IMPLANT
SUT PROLENE 4 0 RB 1 (SUTURE)
SUT PROLENE 4 0 SH DA (SUTURE) IMPLANT
SUT PROLENE 4-0 RB1 .5 CRCL 36 (SUTURE) IMPLANT
SUT PROLENE 5 0 C 1 36 (SUTURE) IMPLANT
SUT PROLENE 6 0 C 1 30 (SUTURE) ×20 IMPLANT
SUT PROLENE 7 0 BV 1 (SUTURE) ×4 IMPLANT
SUT PROLENE 7 0 BV1 MDA (SUTURE) ×4 IMPLANT
SUT PROLENE 8 0 BV175 6 (SUTURE) ×4 IMPLANT
SUT SILK  1 MH (SUTURE) ×2
SUT SILK 1 MH (SUTURE) ×2 IMPLANT
SUT STEEL STERNAL CCS#1 18IN (SUTURE) ×8 IMPLANT
SUT STEEL SZ 6 DBL 3X14 BALL (SUTURE) IMPLANT
SUT VIC AB 1 CTX 36 (SUTURE) ×6
SUT VIC AB 1 CTX36XBRD ANBCTR (SUTURE) ×6 IMPLANT
SUT VIC AB 2-0 CT1 27 (SUTURE) ×2
SUT VIC AB 2-0 CT1 TAPERPNT 27 (SUTURE) ×2 IMPLANT
SUT VIC AB 2-0 CTX 27 (SUTURE) IMPLANT
SUT VIC AB 3-0 SH 27 (SUTURE)
SUT VIC AB 3-0 SH 27X BRD (SUTURE) IMPLANT
SUT VIC AB 3-0 X1 27 (SUTURE) IMPLANT
SUT VICRYL 4-0 PS2 18IN ABS (SUTURE) IMPLANT
SYSTEM SAHARA CHEST DRAIN ATS (WOUND CARE) ×4 IMPLANT
TAPE CLOTH SURG 4X10 WHT LF (GAUZE/BANDAGES/DRESSINGS) ×4 IMPLANT
TAPE PAPER 2X10 WHT MICROPORE (GAUZE/BANDAGES/DRESSINGS) ×4 IMPLANT
TOWEL GREEN STERILE (TOWEL DISPOSABLE) ×4 IMPLANT
TOWEL GREEN STERILE FF (TOWEL DISPOSABLE) ×4 IMPLANT
TRAY FOLEY SILVER 16FR TEMP (SET/KITS/TRAYS/PACK) ×4 IMPLANT
TUBING INSUFFLATION (TUBING) ×4 IMPLANT
UNDERPAD 30X30 (UNDERPADS AND DIAPERS) ×4 IMPLANT
WATER STERILE IRR 1000ML POUR (IV SOLUTION) ×8 IMPLANT

## 2018-04-08 NOTE — Anesthesia Procedure Notes (Signed)
Arterial Line Insertion Start/End4/18/2019 6:48 AM, 04/08/2018 6:54 AM Performed by: Cleda Daub, CRNA, CRNA  Preanesthetic checklist: patient identified, IV checked, site marked, risks and benefits discussed, surgical consent, monitors and equipment checked, pre-op evaluation and anesthesia consent Lidocaine 1% used for infiltration Left, radial was placed Catheter size: 20 G Hand hygiene performed , maximum sterile barriers used  and Seldinger technique used Allen's test indicative of satisfactory collateral circulation Attempts: 1 Procedure performed using ultrasound guided technique. Ultrasound Notes:anatomy identified, needle tip was noted to be adjacent to the nerve/plexus identified and no ultrasound evidence of intravascular and/or intraneural injection Following insertion, dressing applied and Biopatch. Post procedure assessment: normal  Patient tolerated the procedure well with no immediate complications.

## 2018-04-08 NOTE — Anesthesia Procedure Notes (Signed)
Central Venous Catheter Insertion Performed by: Suzette Battiest, MD, anesthesiologist Start/End4/18/2019 7:00 AM, 04/08/2018 7:15 AM Patient location: Pre-op. Preanesthetic checklist: patient identified, IV checked, site marked, risks and benefits discussed, surgical consent, monitors and equipment checked, pre-op evaluation, timeout performed and anesthesia consent Position: Trendelenburg Lidocaine 1% used for infiltration and patient sedated Hand hygiene performed , maximum sterile barriers used  and Seldinger technique used Catheter size: 8.5 Fr Total catheter length 10. Central line and PA cath was placed.Sheath introducer Swan type:thermodilution PA Cath depth:50 Procedure performed using ultrasound guided technique. Ultrasound Notes:anatomy identified, needle tip was noted to be adjacent to the nerve/plexus identified, no ultrasound evidence of intravascular and/or intraneural injection and image(s) printed for medical record Attempts: 3 Following insertion, line sutured, dressing applied and Biopatch. Post procedure assessment: blood return through all ports, free fluid flow and no air  Patient tolerated the procedure well with no immediate complications.

## 2018-04-08 NOTE — OR Nursing (Signed)
2nd call to SICU charge nurse was made at 1215

## 2018-04-08 NOTE — Anesthesia Procedure Notes (Signed)
Central Venous Catheter Insertion Performed by: Suzette Battiest, MD, anesthesiologist Start/End4/18/2019 7:00 AM, 04/08/2018 7:15 AM Patient location: Pre-op. Preanesthetic checklist: patient identified, IV checked, site marked, risks and benefits discussed, surgical consent, monitors and equipment checked, pre-op evaluation, timeout performed and anesthesia consent Hand hygiene performed  and maximum sterile barriers used  PA cath was placed.Swan type:thermodilution Procedure performed without using ultrasound guided technique. Attempts: 1 Patient tolerated the procedure well with no immediate complications.

## 2018-04-08 NOTE — Brief Op Note (Signed)
04/08/2018  3:02 PM  PATIENT:  [name]  84 y.o. male  PRE-OPERATIVE DIAGNOSIS:  CAD  POST-OPERATIVE DIAGNOSIS:  CAD  PROCEDURE:  Procedure(s): CORONARY ARTERY BYPASS GRAFTING (CABG) x four, using left mammary artery and right leg greater saphenous vein harvested endoscopically. (N/A) TRANSESOPHAGEAL ECHOCARDIOGRAM (TEE) (N/A)  LIMA to LAD SVG to OM2 SVG to Diag1 SVG to Acute marginal   SURGEON:  Surgeon(s) and Role:    * Bartle, Fernande Boyden, MD - Primary  PHYSICIAN ASSISTANT:  Nicholes Rough, PA-C   ANESTHESIA:   general  EBL:  915 mL   BLOOD ADMINISTERED:none  DRAINS: ROUTINE   LOCAL MEDICATIONS USED:  NONE  SPECIMEN:  No Specimen  DISPOSITION OF SPECIMEN:  N/A  COUNTS:  YES  DICTATION: .Dragon Dictation  PLAN OF CARE: Admit to inpatient   PATIENT DISPOSITION:  ICU - intubated and hemodynamically stable.   Delay start of Pharmacological VTE agent (>24hrs) due to surgical blood loss or risk of bleeding: yes

## 2018-04-08 NOTE — Op Note (Signed)
CARDIOVASCULAR SURGERY OPERATIVE NOTE  04/08/2018  Surgeon:  Gaye Pollack, MD  First Assistant: Nicholes Rough,  PA-C   Preoperative Diagnosis:  Severe multi-vessel coronary artery disease   Postoperative Diagnosis:  Same   Procedure:  1. Median Sternotomy 2. Extracorporeal circulation 3.   Coronary artery bypass grafting x 4   Left internal mammary graft to the LAD  SVG to diagonal   SVG to OM2  SVG to AM 4.   Endoscopic vein harvest from the right leg   Anesthesia:  General Endotracheal   Clinical History/Surgical Indication:  This 84 year old active gentleman has severe three-vessel coronary disease with normal left ventricular systolic function. He has recurrent substernal chest discomfort with low-level exertion while walking on his treadmill or riding his exercise bike. This is markedly limiting his quality of life according to he and his family. He has significant calcification throughout the proximal LAD and percutaneous intervention would require atherectomy and use of dual antiplatelet therapy which could become complicated in this patient with chronic thrombocytopenia. I agree that coronary bypass graft surgery is the best treatment for his coronary disease. Although he is 84 years old he is physiologically much younger and I think he would tolerate coronary bypass graft surgery with low surgical risk. His echo shows a trileaflet aortic valve that is severely thickened and calcified but with good mobility. The mean gradient is only 9 mm Hg and DVI is 0.48 with a valve area of 2 cm2. This is consistent with mild AS and does not require AVR. He does have chronic thrombocytopenia with a platelet count of 50-60,000. His last count was 76,000. We would most likely have to give him platelet transfusions at the time of surgery. After coronary bypass graft surgery I think he could be  maintained on low-dose aspirin and if he developed any bleeding then discontinuation of all antiplatelet agents would still be acceptable. I have reviewed the cardiac catheterization findings with the patient and his family and answered all their questions.  I discussed the operative procedure with the patient and family including alternatives, benefits and risks; including but not limited to bleeding, blood transfusion, infection, stroke, myocardial infarction, graft failure, heart block requiring a permanent pacemaker, organ dysfunction, and death. [name] understands and agrees to proceed.     Preparation:  The patient was seen in the preoperative holding area and the correct patient, correct operation were confirmed with the patient after reviewing the medical record and catheterization. The consent was signed by me. Preoperative antibiotics were given. A pulmonary arterial line and radial arterial line were placed by the anesthesia team. The patient was taken back to the operating room and positioned supine on the operating room table. After being placed under general endotracheal anesthesia by the anesthesia team a foley catheter was placed. The neck, chest, abdomen, and both legs were prepped with betadine soap and solution and draped in the usual sterile manner. A surgical time-out was taken and the correct patient and operative procedure were confirmed with the nursing and anesthesia staff.  TEE:  Performed by Dr. Tamela Gammon. This showed normal LV systolic function. There was mild MR. The aortic valve was calcified and thickened but fair leaflet mobility and the mean gradient was only 9 mm Hg. No AI.  Cardiopulmonary Bypass:  A median sternotomy was performed. The pericardium was opened in the midline. Right ventricular function appeared normal. The ascending aorta was of normal size and had no palpable plaque. There were no contraindications  to aortic cannulation or cross-clamping.  The patient was fully systemically heparinized and the ACT was maintained > 400 sec. The proximal aortic arch was cannulated with a 20 F aortic cannula for arterial inflow. Venous cannulation was performed via the right atrial appendage using a two-staged venous cannula. An antegrade cardioplegia/vent cannula was inserted into the mid-ascending aorta. Aortic occlusion was performed with a single cross-clamp. Systemic cooling to 32 degrees Centigrade and topical cooling of the heart with iced saline were used. Hyperkalemic antegrade cold blood cardioplegia was used to induce diastolic arrest and was then given at about 20 minute intervals throughout the period of arrest to maintain myocardial temperature at or below 10 degrees centigrade. A temperature probe was inserted into the interventricular septum and an insulating pad was placed in the pericardium.   Left internal mammary harvest:  The left side of the sternum was retracted using the Rultract retractor. The left internal mammary artery was harvested as a pedicle graft. All side branches were clipped. It was a medium-sized vessel of good quality with excellent blood flow. It was ligated distally and divided. It was sprayed with topical papaverine solution to prevent vasospasm.   Endoscopic vein harvest:  The right greater saphenous vein was harvested endoscopically through a 2 cm incision medial to the right knee. It was harvested from the upper thigh to below the knee. It was a medium-sized vein of good quality. The side branches were all ligated with 4-0 silk ties.    Coronary arteries:  The coronary arteries were examined.   LAD:  Large vessel with no distal disease. Diagonal heavily calcified proximally but the distal vessel had no disease.  LCX:  Large OM2 with mild distal disease  RCA:  Moderate sized, nondominant vessel that gave off a moderate sized AM branch. The main RCA was deep in the AV groove.   Grafts:  1. LIMA to the  LAD: 2.5 mm. It was sewn end to side using 8-0 prolene continuous suture. 2. SVG to diagonal:  1.75 mm. It was sewn end to side using 7-0 prolene continuous suture. 3. SVG to OM2:  1.75 mm. It was sewn end to side using 7-0 prolene continuous suture. 4. SVG to AM 1.6:  end mm. It was sewn end to side using 7-0 prolene continuous suture.  The proximal vein graft anastomoses were performed to the mid-ascending aorta using continuous 6-0 prolene suture. Graft markers were placed around the proximal anastomoses.   Completion:  The patient was rewarmed to 37 degrees Centigrade. The clamp was removed from the LIMA pedicle and there was rapid warming of the septum and return of ventricular fibrillation. The crossclamp was removed with a time of 72 minutes. There was spontaneous return of sinus rhythm. The distal and proximal anastomoses were checked for hemostasis. The position of the grafts was satisfactory. Two temporary epicardial pacing wires were placed on the right atrium and two on the right ventricle. The patient was weaned from CPB without difficulty on no inotropes. CPB time was 92 minutes. Cardiac output was 5 LPM.TEE showed normal LV systolic function.  Heparin was fully reversed with protamine and the aortic and venous cannulas removed. Hemostasis was achieved. Mediastinal and left pleural drainage tubes were placed. The sternum was closed with double #6 stainless steel wires. The fascia was closed with continuous # 1 vicryl suture. The subcutaneous tissue was closed with 2-0 vicryl continuous suture. The skin was closed with 3-0 vicryl subcuticular suture. All sponge, needle, and instrument counts  were reported correct at the end of the case. Dry sterile dressings were placed over the incisions and around the chest tubes which were connected to pleurevac suction. The patient was then transported to the surgical intensive care unit in stable condition.

## 2018-04-08 NOTE — Progress Notes (Signed)
NIF -26cmH20 and VC 1326ml

## 2018-04-08 NOTE — Progress Notes (Signed)
Patient extubated at 1720 to 3L [place].  He is alert and oriented x4.  MD Roxan Hockey made aware of alkalotic blood gas prior to extubation. He gave the OK to extubate.    MD Bartle aware of extubation and CI being low.

## 2018-04-08 NOTE — Progress Notes (Signed)
  Echocardiogram Echocardiogram Transesophageal has been performed.  Mikalah Skyles G Lynett Brasil 04/08/2018, 10:20 AM

## 2018-04-08 NOTE — Progress Notes (Signed)
Pt extubated and placed on 3 LPM Nasal Cannula. ETT leak heard before extubation. Pt SpO2 at 99-100%, RR 14, HR 70. No stridor heard. Breath sounds equal and bilateral. No distress noted. Pt verbalizing name and having conversation. Pt oriented to time and place.

## 2018-04-08 NOTE — Anesthesia Procedure Notes (Signed)
Procedure Name: Intubation Date/Time: 04/08/2018 7:45 AM Performed by: Cleda Daub, CRNA Pre-anesthesia Checklist: Patient identified, Emergency Drugs available, Suction available and Patient being monitored Patient Re-evaluated:Patient Re-evaluated prior to induction Oxygen Delivery Method: Circle system utilized Preoxygenation: Pre-oxygenation with 100% oxygen Induction Type: IV induction Ventilation: Mask ventilation without difficulty and Mask ventilation throughout procedure Laryngoscope Size: Mac and 3 Grade View: Grade I Tube type: Oral Tube size: 7.5 mm Number of attempts: 1 Airway Equipment and Method: Stylet Placement Confirmation: ETT inserted through vocal cords under direct vision,  positive ETCO2 and breath sounds checked- equal and bilateral Secured at: 21 cm Tube secured with: Tape Dental Injury: Teeth and Oropharynx as per pre-operative assessment

## 2018-04-08 NOTE — Transfer of Care (Signed)
Immediate Anesthesia Transfer of Care Note  Patient: [name]  Procedure(s) Performed: CORONARY ARTERY BYPASS GRAFTING (CABG) x four, using left mammary artery and right leg greater saphenous vein harvested endoscopically. (N/A Chest) TRANSESOPHAGEAL ECHOCARDIOGRAM (TEE) (N/A )  Patient Location: ICU  Anesthesia Type:General  Level of Consciousness: sedated and Patient remains intubated per anesthesia plan  Airway & Oxygen Therapy: Patient remains intubated per anesthesia plan and Patient placed on Ventilator (see vital sign flow sheet for setting)  Post-op Assessment: Report given to RN and Post -op Vital signs reviewed and stable  Post vital signs: Reviewed and stable  Last Vitals:  Vitals Value Taken Time  BP    Temp 35.3 C 04/08/2018  1:11 PM  Pulse 80 04/08/2018  1:11 PM  Resp 12 04/08/2018  1:11 PM  SpO2 100 % 04/08/2018  1:11 PM  Vitals shown include unvalidated device data.  Last Pain:  Vitals:   04/08/18 0556  TempSrc: Oral      Patients Stated Pain Goal: 2 (36/62/94 [id number])  Complications: No apparent anesthesia complications

## 2018-04-08 NOTE — Anesthesia Postprocedure Evaluation (Signed)
Anesthesia Post Note  Patient: [name]  Procedure(s) Performed: CORONARY ARTERY BYPASS GRAFTING (CABG) x four, using left mammary artery and right leg greater saphenous vein harvested endoscopically. (N/A Chest) TRANSESOPHAGEAL ECHOCARDIOGRAM (TEE) (N/A )     Patient location during evaluation: SICU Anesthesia Type: General Level of consciousness: sedated Pain management: pain level controlled Vital Signs Assessment: post-procedure vital signs reviewed and stable Respiratory status: patient remains intubated per anesthesia plan Cardiovascular status: stable Postop Assessment: no apparent nausea or vomiting Anesthetic complications: no    Last Vitals:  Vitals:   04/08/18 1305 04/08/18 1400  BP:  97/73  Pulse:  80  Resp:  12  Temp:  (!) 35.5 C  SpO2: 99% 100%    Last Pain:  Vitals:   04/08/18 0556  TempSrc: Oral                 Franci Oshana DANIEL

## 2018-04-08 NOTE — OR Nursing (Signed)
SICU charge nurse notified of retrator pulled, 45 min out

## 2018-04-08 NOTE — Interval H&P Note (Signed)
History and Physical Interval Note:  04/08/2018 5:37 AM  [name]  has presented today for surgery, with the diagnosis of CAD  The various methods of treatment have been discussed with the patient and family. After consideration of risks, benefits and other options for treatment, the patient has consented to  Procedure(s): CORONARY ARTERY BYPASS GRAFTING (CABG) (N/A) TRANSESOPHAGEAL ECHOCARDIOGRAM (TEE) (N/A) as a surgical intervention .  The patient's history has been reviewed, patient examined, no change in status, stable for surgery.  I have reviewed the patient's chart and labs.  Questions were answered to the patient's satisfaction.     Gaye Pollack

## 2018-04-09 ENCOUNTER — Inpatient Hospital Stay (HOSPITAL_COMMUNITY): Payer: Medicare Other

## 2018-04-09 ENCOUNTER — Other Ambulatory Visit: Payer: Self-pay

## 2018-04-09 LAB — CBC
HCT: 27.9 % — ABNORMAL LOW (ref 39.0–52.0)
HCT: 27.2 % — ABNORMAL LOW (ref 39.0–52.0)
HCT: 28 % — ABNORMAL LOW (ref 39.0–52.0)
Hemoglobin: 9.2 g/dL — ABNORMAL LOW (ref 13.0–17.0)
Hemoglobin: 8.9 g/dL — ABNORMAL LOW (ref 13.0–17.0)
Hemoglobin: 9.2 g/dL — ABNORMAL LOW (ref 13.0–17.0)
MCH: 28 pg (ref 26.0–34.0)
MCH: 28 pg (ref 26.0–34.0)
MCH: 28.5 pg (ref 26.0–34.0)
MCHC: 33 g/dL (ref 30.0–36.0)
MCHC: 32.7 g/dL (ref 30.0–36.0)
MCHC: 32.9 g/dL (ref 30.0–36.0)
MCV: 84.8 fL (ref 78.0–100.0)
MCV: 85.5 fL (ref 78.0–100.0)
MCV: 86.7 fL (ref 78.0–100.0)
Platelets: 71 10*3/uL — ABNORMAL LOW (ref 150–400)
Platelets: 65 10*3/uL — ABNORMAL LOW (ref 150–400)
Platelets: 60 10*3/uL — ABNORMAL LOW (ref 150–400)
RBC: 3.29 MIL/uL — ABNORMAL LOW (ref 4.22–5.81)
RBC: 3.18 MIL/uL — ABNORMAL LOW (ref 4.22–5.81)
RBC: 3.23 MIL/uL — ABNORMAL LOW (ref 4.22–5.81)
RDW: 14.4 % (ref 11.5–15.5)
RDW: 14.5 % (ref 11.5–15.5)
RDW: 14.8 % (ref 11.5–15.5)
WBC: 7.6 10*3/uL (ref 4.0–10.5)
WBC: 7.1 10*3/uL (ref 4.0–10.5)
WBC: 6.9 10*3/uL (ref 4.0–10.5)

## 2018-04-09 LAB — PREPARE PLATELET PHERESIS
Unit division: 0
Unit division: 0
Unit division: 0

## 2018-04-09 LAB — GLUCOSE, CAPILLARY
Glucose-Capillary: 112 mg/dL — ABNORMAL HIGH (ref 65–99)
Glucose-Capillary: 132 mg/dL — ABNORMAL HIGH (ref 65–99)
Glucose-Capillary: 162 mg/dL — ABNORMAL HIGH (ref 65–99)
Glucose-Capillary: 117 mg/dL — ABNORMAL HIGH (ref 65–99)
Glucose-Capillary: 134 mg/dL — ABNORMAL HIGH (ref 65–99)

## 2018-04-09 LAB — MAGNESIUM
Magnesium: 2.9 mg/dL — ABNORMAL HIGH (ref 1.7–2.4)
Magnesium: 2.6 mg/dL — ABNORMAL HIGH (ref 1.7–2.4)

## 2018-04-09 LAB — CREATININE, SERUM
Creatinine, Ser: 0.91 mg/dL (ref 0.61–1.24)
Creatinine, Ser: 0.9 mg/dL (ref 0.61–1.24)
GFR calc Af Amer: >60 mL/min (ref >60)
GFR calc Af Amer: >60 mL/min (ref >60)
GFR calc non Af Amer: >60 mL/min (ref >60)
GFR calc non Af Amer: >60 mL/min (ref >60)

## 2018-04-09 LAB — BPAM PLATELET PHERESIS
Blood Product Expiration Date: 201904182359
Blood Product Expiration Date: 201904192359
Blood Product Expiration Date: 201904202359
ISSUE DATE / TIME: 201904180724
ISSUE DATE / TIME: 201904180724
ISSUE DATE / TIME: 201904180724
Unit Type and Rh: 5100
Unit Type and Rh: 6200
Unit Type and Rh: 7300

## 2018-04-09 LAB — BPAM CRYOPRECIPITATE
Blood Product Expiration Date: 201904181725
ISSUE DATE / TIME: 201904181155
Unit Type and Rh: 6200

## 2018-04-09 LAB — POCT I-STAT 4, (NA,K, GLUC, HGB,HCT)
Glucose, Bld: 111 mg/dL — ABNORMAL HIGH (ref 65–99)
HCT: 25 % — ABNORMAL LOW (ref 39.0–52.0)
Hemoglobin: 8.5 g/dL — ABNORMAL LOW (ref 13.0–17.0)
Potassium: 4 mmol/L (ref 3.5–5.1)
Sodium: 135 mmol/L (ref 135–145)

## 2018-04-09 LAB — PREPARE CRYOPRECIPITATE: Unit division: 0

## 2018-04-09 LAB — BASIC METABOLIC PANEL
Anion gap: 7 (ref 5–15)
BUN: 16 mg/dL (ref 6–20)
CO2: 22 mmol/L (ref 22–32)
Calcium: 7.4 mg/dL — ABNORMAL LOW (ref 8.9–10.3)
Chloride: 104 mmol/L (ref 101–111)
Creatinine, Ser: 0.85 mg/dL (ref 0.61–1.24)
GFR calc Af Amer: >60 mL/min (ref >60)
GFR calc non Af Amer: >60 mL/min (ref >60)
Glucose, Bld: 112 mg/dL — ABNORMAL HIGH (ref 65–99)
Potassium: 4.1 mmol/L (ref 3.5–5.1)
Sodium: 133 mmol/L — ABNORMAL LOW (ref 135–145)

## 2018-04-09 MED ORDER — ENOXAPARIN SODIUM 30 MG/0.3ML ~~LOC~~ SOLN
30.0000 mg | Freq: Every day | SUBCUTANEOUS | Status: DC
  Filled 2018-04-09: qty 0.3

## 2018-04-09 MED ORDER — INSULIN ASPART 100 UNIT/ML ~~LOC~~ SOLN
0.0000 [IU] | SUBCUTANEOUS | Status: DC
  Administered 2018-04-09: 4 [IU] via SUBCUTANEOUS
  Administered 2018-04-09 – 2018-04-10 (×4): 2 [IU] via SUBCUTANEOUS

## 2018-04-09 MED FILL — Sodium Bicarbonate IV Soln 8.4%: INTRAVENOUS | Qty: 50 | Status: AC

## 2018-04-09 MED FILL — Thrombin For Soln 20000 Unit: CUTANEOUS | Qty: 1 | Status: AC

## 2018-04-09 MED FILL — Potassium Chloride Inj 2 mEq/ML: INTRAVENOUS | Qty: 40 | Status: AC

## 2018-04-09 MED FILL — Heparin Sodium (Porcine) Inj 1000 Unit/ML: INTRAMUSCULAR | Qty: 30 | Status: AC

## 2018-04-09 MED FILL — Electrolyte-R (PH 7.4) Solution: INTRAVENOUS | Qty: 4000 | Status: AC

## 2018-04-09 MED FILL — Sodium Chloride IV Soln 0.9%: INTRAVENOUS | Qty: 2000 | Status: AC

## 2018-04-09 MED FILL — Heparin Sodium (Porcine) Inj 1000 Unit/ML: INTRAMUSCULAR | Qty: 10 | Status: AC

## 2018-04-09 MED FILL — Lidocaine HCl(Cardiac) IV PF Soln Pref Syr 100 MG/5ML (2%): INTRAVENOUS | Qty: 5 | Status: AC

## 2018-04-09 MED FILL — Mannitol IV Soln 20%: INTRAVENOUS | Qty: 500 | Status: AC

## 2018-04-09 MED FILL — Magnesium Sulfate Inj 50%: INTRAMUSCULAR | Qty: 10 | Status: AC

## 2018-04-09 NOTE — Progress Notes (Signed)
TCTS BRIEF SICU PROGRESS NOTE  1 Day Post-Op  S/P Procedure(s) (LRB): CORONARY ARTERY BYPASS GRAFTING (CABG) x four, using left mammary artery and right leg greater saphenous vein harvested endoscopically. (N/A) TRANSESOPHAGEAL ECHOCARDIOGRAM (TEE) (N/A)   Stable day NSR w/ stable BP Breathing comfortably on room air Adequate UOP Labs okay  Plan: Continue current plan  Rexene Alberts, MD 04/09/2018 8:17 PM

## 2018-04-09 NOTE — Progress Notes (Signed)
PT Cancellation Note  Patient Details Name: [name] MRN: [id number] DOB: 14-Mar-1926   Cancelled Treatment:    Reason Eval/Treat Not Completed: Medical issues which prohibited therapy(order received, await lines pulled)   Emiley Digiacomo B Kaileena Obi 04/09/2018, 8:14 AM  Elwyn Reach, [place]

## 2018-04-09 NOTE — Care Management Note (Signed)
Case Management Note Marvetta Gibbons RN, BSN Unit 4E-Case Manager-- [place] coverage [phone]  Patient Details  Name: [name] MRN: [id number] Date of Birth: 1926-11-04  Subjective/Objective:   Pt admitted s/p CABGx4                 Action/Plan: PTA pt lived at home with spouse- PT eval pending- CM to follow for transition of care needs  Expected Discharge Date:                  Expected Discharge Plan:     In-House Referral:     Discharge planning Services  CM Consult  Post Acute Care Choice:    Choice offered to:     DME Arranged:    DME Agency:     HH Arranged:    HH Agency:     Status of Service:  In process, will continue to follow  If discussed at Long Length of Stay Meetings, dates discussed:   Discharge Disposition:    Additional Comments:  Dawayne Patricia, RN 04/09/2018, 11:57 AM

## 2018-04-09 NOTE — Progress Notes (Signed)
Pt ambulated in hallway with PT. While ambulating, pt c/o CP 10/10. Pt brought back to bed at this time. VSS. EKG completed. Prn morphine given.  After giving morphine, pt still c/o CP with minimal relief, PRN oxycodone given. Will continue to monitor closely.

## 2018-04-09 NOTE — Evaluation (Signed)
Physical Therapy Evaluation Patient Details Name: [name] MRN: [id number] DOB: 05-17-1926 Today's Date: 04/09/2018   History of Present Illness  84 yo active gentleman admitted for CABG x 4. PMHx: CAD, HTN, hyperlipidemia, thrombocytopenia, PVD, psoriasis, and prior AAA s/p repair in 1999 with subsequent distal anastomotic pseudoaneurysm      Clinical Impression  Patient is s/p above surgery resulting in functional limitations due to the deficits listed below (see PT Problem List). PTA, pt living with wife in 1 story home with 5 stairs to enter, independent with all mobility and active with daily exercise. Upon eval, pt presents with post surgical chest pain and activity intolerance. Min guard level for transfers and ambulating 30' this visit before having c/o of chest pain, desat to 86% on RA. Returned to room via chair and EKG performed by RN. Will continue to progress patient as tolerated next PT visit, anticipate he will do well with therapy. Patient will benefit from skilled PT to increase their independence and safety with mobility to allow discharge to the venue listed below.     Vitals during session :  Seated at Rest: BP 118/71 SpO2: 94% on RA, HR: 71 With Activity: BP 151/71  SpO2: 86% on RA, HR 75 Supine end of session: BP 144/69  SpO2: 93% on RA, HR 73    Follow Up Recommendations Home health PT;Supervision/Assistance - 24 hour    Equipment Recommendations  Rolling walker with 5" wheels(TBD )    Recommendations for Other Services       Precautions / Restrictions Precautions Precautions: Sternal;Fall Precaution Booklet Issued: Yes (comment) Precaution Comments: reviewed sternal precautions with patient verablly/handout. Restrictions Weight Bearing Restrictions: Yes Other Position/Activity Restrictions: Sternal precautions      Mobility  Bed Mobility               General bed mobility comments: OOB at entry  Transfers Overall transfer level: Needs  assistance Equipment used: None;Rolling walker (2 wheeled) Transfers: Sit to/from Stand Sit to Stand: Min guard         General transfer comment: Min guard for safety, cues for adherence to sternal precautions, patient with good strength and able to power up into RW without physical assistance.   Ambulation/Gait Ambulation/Gait assistance: Min guard Ambulation Distance (Feet): 30 Feet Assistive device: Rolling walker (2 wheeled) Gait Pattern/deviations: Narrow base of support;Decreased stance time - left Gait velocity: slight decrease   General Gait Details: Patient with min guard and use of RW, 30' had standing rest break with c/o of chest pain, returned to seated as pain did not subside. BP: 151/71 SpO2: 86% on RA HR: 73.   Stairs            Wheelchair Mobility    Modified Rankin (Stroke Patients Only)       Balance Overall balance assessment: Needs assistance Sitting-balance support: Feet unsupported;No upper extremity supported Sitting balance-Leahy Scale: Good     Standing balance support: During functional activity;Bilateral upper extremity supported Standing balance-Leahy Scale: Fair                               Pertinent Vitals/Pain Pain Assessment: 0-10 Pain Score: 7  Pain Location: chest Pain Descriptors / Indicators: Sharp Pain Intervention(s): Limited activity within patient's tolerance;Monitored during session;Premedicated before session;Repositioned(Patient with little pain at baseline, chest pain w/ activity)    Home Living Family/patient expects to be discharged to:: Private residence Living Arrangements: Spouse/significant other  Available Help at Discharge: Family Type of Home: House Home Access: Stairs to enter Entrance Stairs-Rails: Right Entrance Stairs-Number of Steps: 5 Home Layout: One level Home Equipment: Grab bars - toilet      Prior Function Level of Independence: Independent         Comments: very active,  competes in [employer], works out daily     Journalist, newspaper        Extremity/Trunk Assessment   Upper Extremity Assessment Upper Extremity Assessment: Overall WFL for tasks assessed    Lower Extremity Assessment Lower Extremity Assessment: Overall WFL for tasks assessed       Communication   Communication: HOH  Cognition Arousal/Alertness: Awake/alert Behavior During Therapy: WFL for tasks assessed/performed Overall Cognitive Status: Within Functional Limits for tasks assessed                                        General Comments      Exercises     Assessment/Plan    PT Assessment Patient needs continued PT services  PT Problem List Decreased strength;Decreased activity tolerance;Decreased range of motion;Pain       PT Treatment Interventions DME instruction;Gait training;Stair training;Functional mobility training;Therapeutic activities;Therapeutic exercise    PT Goals (Current goals can be found in the Care Plan section)  Acute Rehab PT Goals Patient Stated Goal: return home PT Goal Formulation: With patient/family Time For Goal Achievement: 04/23/18 Potential to Achieve Goals: Good    Frequency Min 3X/week   Barriers to discharge        Co-evaluation               AM-PAC PT "6 Clicks" Daily Activity  Outcome Measure Difficulty turning over in bed (including adjusting bedclothes, sheets and blankets)?: A Lot Difficulty moving from lying on back to sitting on the side of the bed? : A Lot Difficulty sitting down on and standing up from a chair with arms (e.g., wheelchair, bedside commode, etc,.)?: A Lot Help needed moving to and from a bed to chair (including a wheelchair)?: A Little Help needed walking in hospital room?: A Little Help needed climbing 3-5 steps with a railing? : A Lot 6 Click Score: 14    End of Session Equipment Utilized During Treatment: Gait belt;Oxygen Activity Tolerance: Patient limited by pain Patient  left: in bed;with call bell/phone within reach;with nursing/sitter in room;with family/visitor present Nurse Communication: Mobility status(chest pain) PT Visit Diagnosis: Unsteadiness on feet (R26.81);Other abnormalities of gait and mobility (R26.89);Pain;Muscle weakness (generalized) (M62.81) Pain - part of body: (Chest)    Time: 4536-4680 PT Time Calculation (min) (ACUTE ONLY): 27 min   Charges:   PT Evaluation $PT Eval Moderate Complexity: 1 Mod PT Treatments $Gait Training: 8-22 mins   PT G Codes:        Reinaldo Berber, PT, DPT Acute Rehab Services Pager: [phone]    Reinaldo Berber 04/09/2018, 1:58 PM

## 2018-04-09 NOTE — Progress Notes (Signed)
Patient ID: [name], male   DOB: September 23, 1926, 84 y.o.   MRN: [id number] TCTS DAILY ICU PROGRESS NOTE                   [address]            [place] [address]          [phone]   1 Day Post-Op Procedure(s) (LRB): CORONARY ARTERY BYPASS GRAFTING (CABG) x four, using left mammary artery and right leg greater saphenous vein harvested endoscopically. (N/A) TRANSESOPHAGEAL ECHOCARDIOGRAM (TEE) (N/A)  Total Length of Stay:  LOS: 1 day   Subjective: Patient extubated last night awake alert neurologically intact, good pain control  Objective: Vital signs in last 24 hours: Temp:  [95.5 F (35.3 C)-99.1 F (37.3 C)] 98.6 F (37 C) (04/19 0500) Pulse Rate:  [60-89] 67 (04/19 0500) Cardiac Rhythm: Ventricular paced (04/19 0400) Resp:  [8-24] 9 (04/19 0500) BP: (90-129)/(56-78) 114/63 (04/19 0500) SpO2:  [96 %-100 %] 99 % (04/19 0500) Arterial Line BP: (101-153)/(41-73) 135/48 (04/19 0500) FiO2 (%):  [40 %-100 %] 100 % (04/18 1720) Weight:  [163 lb 8 oz (74.2 kg)] 163 lb 8 oz (74.2 kg) (04/19 0500)  Filed Weights   04/09/18 0500  Weight: 163 lb 8 oz (74.2 kg)    Weight change:    Hemodynamic parameters for last 24 hours: PAP: (4-21)/(0-9) 15/6 CO:  [1.5 L/min-3.8 L/min] 3.7 L/min CI:  [1.5 L/min/m2-2.8 L/min/m2] 2 L/min/m2  Intake/Output from previous day: 04/18 0701 - 04/19 0700 In: 7216.4 [P.O.:120; I.V.:3246.4; Blood:1650; IV Piggyback:2200] Out: 5462 [Urine:3315; Blood:915; Chest Tube:561]  Intake/Output this shift: No intake/output data recorded.  Current Meds: Scheduled Meds: . acetaminophen  1,000 mg Oral Q6H   Or  . acetaminophen (TYLENOL) oral liquid 160 mg/5 mL  1,000 mg Per Tube Q6H  . aspirin EC  325 mg Oral Daily   Or  . aspirin  324 mg Per Tube Daily  . bisacodyl  10 mg Oral Daily   Or  . bisacodyl  10 mg Rectal Daily  . docusate sodium  200 mg Oral Daily  . insulin aspart  0-24 Units Subcutaneous Q4H  . insulin regular   0-10 Units Intravenous TID WC  . mouth rinse  15 mL Mouth Rinse BID  . metoprolol tartrate  12.5 mg Oral BID   Or  . metoprolol tartrate  12.5 mg Per Tube BID  . [START ON 04/10/2018] pantoprazole  40 mg Oral Daily  . rosuvastatin  20 mg Oral QHS  . sodium chloride flush  3 mL Intravenous Q12H   Continuous Infusions: . sodium chloride 20 mL/hr at 04/08/18 1612  . sodium chloride    . sodium chloride 10 mL/hr at 04/09/18 0532  . albumin human 250 mL (04/08/18 2157)  . cefUROXime (ZINACEF)  IV Stopped (04/09/18 7035)  . dexmedetomidine (PRECEDEX) IV infusion Stopped (04/08/18 1455)  . famotidine (PEPCID) IV Stopped (04/08/18 1330)  . lactated ringers    . lactated ringers Stopped (04/08/18 1310)  . lactated ringers 20 mL/hr at 04/08/18 1300  . nitroGLYCERIN Stopped (04/09/18 0506)  . phenylephrine (NEO-SYNEPHRINE) Adult infusion Stopped (04/08/18 1315)   PRN Meds:.sodium chloride, albumin human, lactated ringers, metoprolol tartrate, midazolam, morphine injection, ondansetron (ZOFRAN) IV, oxyCODONE, sodium chloride flush, traMADol  General appearance: alert and cooperative Neurologic: intact Heart: regular rate and rhythm, S1, S2 normal, no murmur, click, rub or gallop Lungs: clear to auscultation bilaterally Abdomen: soft, non-tender; bowel sounds normal;  no masses,  no organomegaly Extremities: extremities normal, atraumatic, no cyanosis or edema and Homans sign is negative, no sign of DVT Wound: Sternum intact dressing in place  Lab Results: CBC: Recent Labs    04/08/18 1931 04/09/18 0354  WBC 6.9 7.6  HGB 9.9* 9.2*  HCT 29.5* 27.9*  PLT 88* 71*   BMET:  Recent Labs    04/06/18 1459  04/08/18 1923 04/08/18 1931 04/09/18 0354  NA 133*   < > 139  --  133*  K 3.8   < > 3.8  --  4.1  CL 100*   < > 103  --  104  CO2 22  --   --   --  22  GLUCOSE 117*   < > 156*  --  112*  BUN 17   < > 17  --  16  CREATININE 0.88   < > 0.70 0.82 0.85  CALCIUM 9.6  --   --   --   7.4*   < > = values in this interval not displayed.    CMET: Lab Results  Component Value Date   WBC 7.6 04/09/2018   HGB 9.2 (L) 04/09/2018   HCT 27.9 (L) 04/09/2018   PLT 71 (L) 04/09/2018   GLUCOSE 112 (H) 04/09/2018   ALT 22 04/06/2018   AST 29 04/06/2018   NA 133 (L) 04/09/2018   K 4.1 04/09/2018   CL 104 04/09/2018   CREATININE 0.85 04/09/2018   BUN 16 04/09/2018   CO2 22 04/09/2018   PSA <0.1 12/16/2012   INR 1.47 04/08/2018   HGBA1C 5.6 04/06/2018      PT/INR:  Recent Labs    04/08/18 1257  LABPROT 17.7*  INR 1.47   Radiology: Dg Chest Port 1 View  Result Date: 04/08/2018 CLINICAL DATA:  Status post CABG. EXAM: PORTABLE CHEST 1 VIEW COMPARISON:  Chest radiograph April 06, 2018 FINDINGS: Interval median sternotomy for CABG. Cardiac silhouette is normal in size. Calcified aortic knob. No pleural effusion or focal consolidation. No pneumothorax. Endotracheal tube projects 6.1 cm above the carina. Swan-Ganz catheter via RIGHT internal jugular venous approach with distal tip projecting main pulmonary artery. Nasogastric tube tip projects in proximal stomach with side port at GE junction region. Mediastinal and LEFT chest tubes. Multiple EKG lines overlie the patient and may obscure subtle underlying pathology. IMPRESSION: Interval median sternotomy for CABG without acute cardiopulmonary process. Well-positioned life-support lines, however nasogastric tube side port projects at GE junction. Aortic Atherosclerosis (ICD10-I70.0). Electronically Signed   By: Elon Alas M.D.   On: 04/08/2018 13:35     Assessment/Plan: S/P Procedure(s) (LRB): CORONARY ARTERY BYPASS GRAFTING (CABG) x four, using left mammary artery and right leg greater saphenous vein harvested endoscopically. (N/A) TRANSESOPHAGEAL ECHOCARDIOGRAM (TEE) (N/A) Mobilize Diuresis d/c tubes/lines See progression orders Expected Acute  Blood - loss Anemia- continue to monitor  Patient with preop  pancytopenia platelets adequate, no evidence of bleeding    Grace Isaac 04/09/2018 7:39 AM

## 2018-04-10 ENCOUNTER — Inpatient Hospital Stay (HOSPITAL_COMMUNITY): Payer: Medicare Other

## 2018-04-10 LAB — BASIC METABOLIC PANEL
Anion gap: 5 (ref 5–15)
BUN: 15 mg/dL (ref 6–20)
CO2: 25 mmol/L (ref 22–32)
Calcium: 7.8 mg/dL — ABNORMAL LOW (ref 8.9–10.3)
Chloride: 100 mmol/L — ABNORMAL LOW (ref 101–111)
Creatinine, Ser: 0.8 mg/dL (ref 0.61–1.24)
GFR calc Af Amer: >60 mL/min (ref >60)
GFR calc non Af Amer: >60 mL/min (ref >60)
Glucose, Bld: 105 mg/dL — ABNORMAL HIGH (ref 65–99)
Potassium: 3.9 mmol/L (ref 3.5–5.1)
Sodium: 130 mmol/L — ABNORMAL LOW (ref 135–145)

## 2018-04-10 LAB — GLUCOSE, CAPILLARY
Glucose-Capillary: 121 mg/dL — ABNORMAL HIGH (ref 65–99)
Glucose-Capillary: 127 mg/dL — ABNORMAL HIGH (ref 65–99)
Glucose-Capillary: 129 mg/dL — ABNORMAL HIGH (ref 65–99)
Glucose-Capillary: 133 mg/dL — ABNORMAL HIGH (ref 65–99)

## 2018-04-10 LAB — CBC
HCT: 27.2 % — ABNORMAL LOW (ref 39.0–52.0)
Hemoglobin: 8.7 g/dL — ABNORMAL LOW (ref 13.0–17.0)
MCH: 27.8 pg (ref 26.0–34.0)
MCHC: 32 g/dL (ref 30.0–36.0)
MCV: 86.9 fL (ref 78.0–100.0)
Platelets: 53 10*3/uL — ABNORMAL LOW (ref 150–400)
RBC: 3.13 MIL/uL — ABNORMAL LOW (ref 4.22–5.81)
RDW: 14.9 % (ref 11.5–15.5)
WBC: 7.3 10*3/uL (ref 4.0–10.5)

## 2018-04-10 MED ORDER — MOVING RIGHT ALONG BOOK
Freq: Once | Status: AC
  Administered 2018-04-10: 16:00:00
  Filled 2018-04-10: qty 1

## 2018-04-10 MED ORDER — POTASSIUM CHLORIDE CRYS ER 20 MEQ PO TBCR: 20.0000 meq | EXTENDED_RELEASE_TABLET | Freq: Every day | ORAL | Status: DC

## 2018-04-10 MED ORDER — MOVING RIGHT ALONG BOOK
Freq: Once | Status: AC
  Administered 2018-04-10: 14:00:00
  Filled 2018-04-10 (×2): qty 1

## 2018-04-10 MED ORDER — FUROSEMIDE 40 MG PO TABS
40.0000 mg | ORAL_TABLET | Freq: Every day | ORAL | Status: DC
  Administered 2018-04-10 – 2018-04-14 (×5): 40 mg via ORAL
  Filled 2018-04-10 (×5): qty 1

## 2018-04-10 NOTE — Progress Notes (Signed)
Right jugular sheath and left chest tube removed. No complications noted, pt tolerated well.

## 2018-04-10 NOTE — Plan of Care (Signed)
Pt alert and oriented. Ambulated (and tolerated well) in the hall, Family at the bedside. Education provided to pt and family. No further question are requested. MD updated as well, at the bedside. See new orders

## 2018-04-10 NOTE — Progress Notes (Signed)
[address]       [place] [address]             [phone]        CARDIOTHORACIC SURGERY PROGRESS NOTE   R2 Days Post-Op Procedure(s) (LRB): CORONARY ARTERY BYPASS GRAFTING (CABG) x four, using left mammary artery and right leg greater saphenous vein harvested endoscopically. (N/A) TRANSESOPHAGEAL ECHOCARDIOGRAM (TEE) (N/A)  Subjective: Looks good.  Ambulated around unit w/out difficulty.  Denies pain, SOB  Objective: Vital signs: BP Readings from Last 1 Encounters:  04/10/18 (!) 113/57   Pulse Readings from Last 1 Encounters:  04/10/18 70   Resp Readings from Last 1 Encounters:  04/10/18 (!) 21   Temp Readings from Last 1 Encounters:  04/10/18 98.1 F (36.7 C) (Oral)    Hemodynamics:    Physical Exam:  Rhythm:   sinus  Breath sounds: clear  Heart sounds:  RRR  Incisions:  Dressing dry, intact  Abdomen:  Warm, well-perfused  Extremities:  Warm, well-perfused  Chest tubes:  low volume thin serosanguinous output, no air leak    Intake/Output from previous day: 04/19 0701 - 04/20 0700 In: 1070 [P.O.:400; I.V.:470; IV Piggyback:200] Out: 1540 [Urine:1360; Chest Tube:180] Intake/Output this shift: Total I/O In: 40 [I.V.:40] Out: 100 [Urine:100]  Lab Results:  CBC: Recent Labs    04/09/18 1707 04/09/18 1712 04/10/18 0605  WBC 6.9  --  7.3  HGB 9.2* 8.5* 8.7*  HCT 28.0* 25.0* 27.2*  PLT 60*  --  53*    BMET:  Recent Labs    04/09/18 0354  04/09/18 1707 04/09/18 1712 04/10/18 0605  NA 133*  --   --  135 130*  K 4.1  --   --  4.0 3.9  CL 104  --   --   --  100*  CO2 22  --   --   --  25  GLUCOSE 112*  --   --  111* 105*  BUN 16  --   --   --  15  CREATININE 0.85   < > 0.90  --  0.80  CALCIUM 7.4*  --   --   --  7.8*   < > = values in this interval not displayed.     PT/INR:   Recent Labs    04/08/18 1257  LABPROT 17.7*  INR 1.47    CBG (last 3)  Recent Labs    04/10/18 0002 04/10/18 0349 04/10/18 0805   GLUCAP 129* 121* 133*    ABG    Component Value Date/Time   PHART 7.348 (L) 04/08/2018 1834   PCO2ART 39.6 04/08/2018 1834   PO2ART 78.0 (L) 04/08/2018 1834   HCO3 21.8 04/08/2018 1834   TCO2 22 04/08/2018 1923   ACIDBASEDEF 4.0 (H) 04/08/2018 1834   O2SAT 95.0 04/08/2018 1834    CXR: PORTABLE CHEST 1 VIEW  COMPARISON:  04/09/2018  FINDINGS: Sequelae of recent CABG are again identified. Swan-Ganz catheter and mediastinal drains have been removed. A right jugular sheath and left chest tube remain. The cardiac silhouette is mildly enlarged. Lung volumes are low with increased central pulmonary vascular congestion and bibasilar atelectasis. There may be a small right pleural effusion. No pneumothorax is identified.  IMPRESSION: 1. Interval removal of Swan-Ganz catheter and mediastinal drains. Left chest tube remains. No pneumothorax. 2. Low lung volumes with pulmonary vascular congestion and increased bibasilar atelectasis.   Electronically Signed   By: Seymour Bars.D.  On: 04/10/2018 07:34  Assessment/Plan: S/P Procedure(s) (LRB): CORONARY ARTERY BYPASS GRAFTING (CABG) x four, using left mammary artery and right leg greater saphenous vein harvested endoscopically. (N/A) TRANSESOPHAGEAL ECHOCARDIOGRAM (TEE) (N/A)  Doing very well POD2 Maintaining NSR w/ stable BP Breathing comfortably w/ O2 sats 97% on RA Expected post op acute blood loss anemia, mild, stable Expected post op atelectasis, mild Expected post op volume excess, weight reportedly 11 lbs > preop, UOP adequate   Mobilize  Gentle diuresis  D/C lines and tubes  Continue ASA, statin, low dose beta blocker  Restart home ARB in 2-3 days if BP will allow  Transfer 4E   Rexene Alberts, MD 04/10/2018 11:45 AM

## 2018-04-10 NOTE — Progress Notes (Signed)
Pt transferred to 4E18, tolerated well. Belongings with patient and family.

## 2018-04-10 NOTE — Progress Notes (Signed)
Received [name] from 2 H.  Patient is awake, alert and oriented x 4.  Denies pain at this time. Dressing to right neck, sternum, mid chest and left groin, CDI.  VSS, BBS clear thur out.  Patient oriented to room,  Verbalized understanding.  Call placed to central monitoring.

## 2018-04-11 ENCOUNTER — Other Ambulatory Visit: Payer: Self-pay

## 2018-04-11 ENCOUNTER — Inpatient Hospital Stay (HOSPITAL_COMMUNITY): Payer: Medicare Other

## 2018-04-11 ENCOUNTER — Encounter (HOSPITAL_COMMUNITY): Payer: Self-pay | Admitting: Surgery

## 2018-04-11 LAB — BPAM RBC
Blood Product Expiration Date: 201905092359
Blood Product Expiration Date: 201905092359
Blood Product Expiration Date: 201905092359
Blood Product Expiration Date: 201905092359
Blood Product Expiration Date: 201905172359
Blood Product Expiration Date: 201905172359
ISSUE DATE / TIME: 201904180715
ISSUE DATE / TIME: 201904180715
ISSUE DATE / TIME: 201904180948
ISSUE DATE / TIME: 201904180948
Unit Type and Rh: 6200
Unit Type and Rh: 6200
Unit Type and Rh: 6200
Unit Type and Rh: 6200
Unit Type and Rh: 6200
Unit Type and Rh: 6200

## 2018-04-11 LAB — TYPE AND SCREEN
ABO/RH(D): A POS
ABO/RH(D): A POS
Antibody Screen: NEGATIVE
Antibody Screen: NEGATIVE
Unit division: 0
Unit division: 0
Unit division: 0
Unit division: 0
Unit division: 0
Unit division: 0

## 2018-04-11 LAB — CBC
HCT: 27.1 % — ABNORMAL LOW (ref 39.0–52.0)
HCT: 28 % — ABNORMAL LOW (ref 39.0–52.0)
Hemoglobin: 8.9 g/dL — ABNORMAL LOW (ref 13.0–17.0)
Hemoglobin: 9.1 g/dL — ABNORMAL LOW (ref 13.0–17.0)
MCH: 28.4 pg (ref 26.0–34.0)
MCH: 28 pg (ref 26.0–34.0)
MCHC: 32.8 g/dL (ref 30.0–36.0)
MCHC: 32.5 g/dL (ref 30.0–36.0)
MCV: 86.6 fL (ref 78.0–100.0)
MCV: 86.2 fL (ref 78.0–100.0)
Platelets: 37 10*3/uL — ABNORMAL LOW (ref 150–400)
Platelets: 45 10*3/uL — ABNORMAL LOW (ref 150–400)
RBC: 3.13 MIL/uL — ABNORMAL LOW (ref 4.22–5.81)
RBC: 3.25 MIL/uL — ABNORMAL LOW (ref 4.22–5.81)
RDW: 14.5 % (ref 11.5–15.5)
RDW: 14.2 % (ref 11.5–15.5)
WBC: 5.1 10*3/uL (ref 4.0–10.5)
WBC: 6.9 10*3/uL (ref 4.0–10.5)

## 2018-04-11 LAB — BASIC METABOLIC PANEL
Anion gap: 8 (ref 5–15)
BUN: 13 mg/dL (ref 6–20)
CO2: 24 mmol/L (ref 22–32)
Calcium: 7.8 mg/dL — ABNORMAL LOW (ref 8.9–10.3)
Chloride: 96 mmol/L — ABNORMAL LOW (ref 101–111)
Creatinine, Ser: 0.82 mg/dL (ref 0.61–1.24)
GFR calc Af Amer: >60 mL/min (ref >60)
GFR calc non Af Amer: >60 mL/min (ref >60)
Glucose, Bld: 126 mg/dL — ABNORMAL HIGH (ref 65–99)
Potassium: 3.7 mmol/L (ref 3.5–5.1)
Sodium: 128 mmol/L — ABNORMAL LOW (ref 135–145)

## 2018-04-11 LAB — APTT: aPTT: 33 seconds (ref 24–36)

## 2018-04-11 LAB — PROTIME-INR
INR: 1.22
Prothrombin Time: 15.3 seconds — ABNORMAL HIGH (ref 11.4–15.2)

## 2018-04-11 LAB — OCCULT BLOOD X 1 CARD TO LAB, STOOL: Fecal Occult Bld: POSITIVE — AB

## 2018-04-11 LAB — GLUCOSE, CAPILLARY: Glucose-Capillary: 125 mg/dL — ABNORMAL HIGH (ref 65–99)

## 2018-04-11 MED ORDER — FAMOTIDINE IN NACL 20-0.9 MG/50ML-% IV SOLN
20.0000 mg | Freq: Two times a day (BID) | INTRAVENOUS | Status: DC
Start: 2018-04-11 — End: 2018-04-13
  Administered 2018-04-11 – 2018-04-12 (×3): 20 mg via INTRAVENOUS
  Filled 2018-04-11 (×3): qty 50

## 2018-04-11 MED ORDER — METOCLOPRAMIDE HCL 5 MG/ML IJ SOLN
10.0000 mg | Freq: Four times a day (QID) | INTRAMUSCULAR | Status: AC
  Administered 2018-04-11 – 2018-04-12 (×3): 10 mg via INTRAVENOUS
  Filled 2018-04-11 (×4): qty 2

## 2018-04-11 MED ORDER — ASPIRIN EC 81 MG PO TBEC
81.0000 mg | DELAYED_RELEASE_TABLET | Freq: Every day | ORAL | Status: DC
  Administered 2018-04-12 – 2018-04-14 (×3): 81 mg via ORAL
  Filled 2018-04-11 (×4): qty 1

## 2018-04-11 MED ORDER — ALUM & MAG HYDROXIDE-SIMETH 200-200-20 MG/5ML PO SUSP: 30.0000 mL | Freq: Four times a day (QID) | ORAL | Status: DC | PRN

## 2018-04-11 MED ORDER — PANTOPRAZOLE SODIUM 40 MG PO TBEC: 40.0000 mg | DELAYED_RELEASE_TABLET | Freq: Two times a day (BID) | ORAL | Status: DC

## 2018-04-11 MED ORDER — POTASSIUM CHLORIDE CRYS ER 20 MEQ PO TBCR
40.0000 meq | EXTENDED_RELEASE_TABLET | Freq: Every day | ORAL | Status: DC
  Administered 2018-04-11 – 2018-04-14 (×4): 40 meq via ORAL
  Filled 2018-04-11 (×4): qty 2

## 2018-04-11 MED ORDER — PANTOPRAZOLE SODIUM 40 MG PO TBEC
40.0000 mg | DELAYED_RELEASE_TABLET | Freq: Two times a day (BID) | ORAL | Status: DC
  Administered 2018-04-11 – 2018-04-14 (×6): 40 mg via ORAL
  Filled 2018-04-11 (×7): qty 1

## 2018-04-11 MED ORDER — SODIUM CHLORIDE 0.9 % IV SOLN: Freq: Once | INTRAVENOUS | Status: DC

## 2018-04-11 NOTE — Plan of Care (Signed)
Care plan reviewed, patient is progressing.  

## 2018-04-11 NOTE — Progress Notes (Signed)
Patient c/o breaking out in a cold sweat. Patient is alert and says that he is having trouble breathing. Sat02 applied at 2L and Vitals taken, b/p 145/84. Patient then c/o pain to his chest. Morphine 2mg  given IV and zofran given IV.  Blood sugar taken 125. EKG done. Patient also tells nurse about chicken soup he was trying to drink earlier but began coughing due to lying down while ingesting.  Resp even and unlabored. HOB up at 48 degrees.   0454- Patient has says that nausea is better. Pain is same. Lung sounds clear in upper and lower bases.

## 2018-04-11 NOTE — Progress Notes (Signed)
Today at approximately 1700. Mr. Gatlin had a BM which he described as soft and black.  Approximately one hour later he had another loose black BM, which a hemoccult sample was obtained and found to be positive.  Call placed to [place], [facility] with updates.  Orders received to get labs, transfuse PLTS , and type and screen.  One unit of PLTS was administered at approximately 1900.  Patient was given results of all tests and agreed to proceed with transfusion.

## 2018-04-11 NOTE — Progress Notes (Addendum)
[address]       [place] [address]             [phone]      3 Days Post-Op Procedure(s) (LRB): CORONARY ARTERY BYPASS GRAFTING (CABG) x four, using left mammary artery and right leg greater saphenous vein harvested endoscopically. (N/A) TRANSESOPHAGEAL ECHOCARDIOGRAM (TEE) (N/A)   Subjective:  Patient states his throat is burning.  States he is having issues with reflux.  He states the protonix he received earlier helped.  Events of morning noted.  + ambulation  No BM  Objective: Vital signs in last 24 hours: Temp:  [97.7 F (36.5 C)-98.6 F (37 C)] 98.6 F (37 C) (04/21 0441) Pulse Rate:  [66-74] 68 (04/21 0500) Cardiac Rhythm: Normal sinus rhythm;Heart block;Bundle branch block (04/21 0700) Resp:  [10-23] 20 (04/21 0500) BP: (107-145)/(57-84) 124/68 (04/21 0500) SpO2:  [91 %-99 %] 95 % (04/21 0500) Weight:  [175 lb 14.8 oz (79.8 kg)] 175 lb 14.8 oz (79.8 kg) (04/21 0500)  Intake/Output from previous day: 04/20 0701 - 04/21 0700 In: 651.5 [P.O.:600; I.V.:51.5] Out: 435 [Urine:435]  General appearance: alert, cooperative and no distress Heart: regular rate and rhythm Lungs: clear to auscultation bilaterally Abdomen: soft, non-tender; bowel sounds normal; no masses,  no organomegaly Extremities: edema trace Wound: clean and dry  Lab Results: Recent Labs    04/10/18 0605 04/11/18 0309  WBC 7.3 5.1  HGB 8.7* 8.9*  HCT 27.2* 27.1*  PLT 53* 37*   BMET:  Recent Labs    04/10/18 0605 04/11/18 0309  NA 130* 128*  K 3.9 3.7  CL 100* 96*  CO2 25 24  GLUCOSE 105* 126*  BUN 15 13  CREATININE 0.80 0.82  CALCIUM 7.8* 7.8*    PT/INR:  Recent Labs    04/08/18 1257  LABPROT 17.7*  INR 1.47   ABG    Component Value Date/Time   PHART 7.348 (L) 04/08/2018 1834   HCO3 21.8 04/08/2018 1834   TCO2 22 04/08/2018 1923   ACIDBASEDEF 4.0 (H) 04/08/2018 1834   O2SAT 95.0 04/08/2018 1834   CBG (last 3)  Recent Labs    04/10/18 0805  04/10/18 1214 04/11/18 0438  GLUCAP 133* 127* 125*    Assessment/Plan: S/P Procedure(s) (LRB): CORONARY ARTERY BYPASS GRAFTING (CABG) x four, using left mammary artery and right leg greater saphenous vein harvested endoscopically. (N/A) TRANSESOPHAGEAL ECHOCARDIOGRAM (TEE) (N/A)  1. CV- NSR with 1st degree AV Block, now in NSR- continue Lopressor, watch BP can likely restart ARB prior to discharge 2. Pulm- no acute issues, continue IS 3. Renal- creatinine WNL, continue lasix, K is a 3.7, will increase potassium to 40 meq daily 4. GI- issues with reflux this morning, not moving bowels... Will increase protonix to 40 mg BID, add prn Maalox, start Reglan for nausea x 4 doses 5. Chronic Pan thrombocytopenia- plts had been increasing, dropped down to 37 K this morning.. Will check HIT to be safe, discuss need for transfusion with Dr. Roxy Manns 6. Dispo- patient with episode of 1st degree AV block, currently in NSR will monitor and hold off on EPW removal for now, adjusted medications for reflux, platelet issues are chronic, however down to 37K this morning, check HIT to ensure didn't develop rxn with surgery, discuss need for transfusion with Dr. Roxy Manns, continue current care   LOS: 3 days    Erin Barrett 04/11/2018  I have seen and examined the patient and agree with the assessment and  plan as outlined.  Patient had 2 melenotic stools this evening.  He denies abdominal pain.  Vitals stable.  Hgb/Hct stable.  Will transfuse platelets.  Add IV pepcid.  Monitor closely.  Rexene Alberts, MD 04/11/2018 7:23 PM

## 2018-04-12 LAB — CBC
HCT: 25.3 % — ABNORMAL LOW (ref 39.0–52.0)
Hemoglobin: 8.3 g/dL — ABNORMAL LOW (ref 13.0–17.0)
MCH: 28.2 pg (ref 26.0–34.0)
MCHC: 32.8 g/dL (ref 30.0–36.0)
MCV: 86.1 fL (ref 78.0–100.0)
Platelets: 50 10*3/uL — ABNORMAL LOW (ref 150–400)
RBC: 2.94 MIL/uL — ABNORMAL LOW (ref 4.22–5.81)
RDW: 14.4 % (ref 11.5–15.5)
WBC: 3.8 10*3/uL — ABNORMAL LOW (ref 4.0–10.5)

## 2018-04-12 LAB — HEPARIN INDUCED PLATELET AB (HIT ANTIBODY): Heparin Induced Plt Ab: 0.102 OD (ref 0.000–0.400)

## 2018-04-12 LAB — COMPREHENSIVE METABOLIC PANEL
ALT: 16 U/L — ABNORMAL LOW (ref 17–63)
AST: 29 U/L (ref 15–41)
Albumin: 3 g/dL — ABNORMAL LOW (ref 3.5–5.0)
Alkaline Phosphatase: 41 U/L (ref 38–126)
Anion gap: 8 (ref 5–15)
BUN: 10 mg/dL (ref 6–20)
CO2: 25 mmol/L (ref 22–32)
Calcium: 7.9 mg/dL — ABNORMAL LOW (ref 8.9–10.3)
Chloride: 99 mmol/L — ABNORMAL LOW (ref 101–111)
Creatinine, Ser: 0.9 mg/dL (ref 0.61–1.24)
GFR calc Af Amer: >60 mL/min (ref >60)
GFR calc non Af Amer: >60 mL/min (ref >60)
Glucose, Bld: 119 mg/dL — ABNORMAL HIGH (ref 65–99)
Potassium: 3.4 mmol/L — ABNORMAL LOW (ref 3.5–5.1)
Sodium: 132 mmol/L — ABNORMAL LOW (ref 135–145)
Total Bilirubin: 1.3 mg/dL — ABNORMAL HIGH (ref 0.3–1.2)
Total Protein: 5 g/dL — ABNORMAL LOW (ref 6.5–8.1)

## 2018-04-12 LAB — BPAM PLATELET PHERESIS
Blood Product Expiration Date: 201904222359
ISSUE DATE / TIME: 201904211834
Unit Type and Rh: 6200

## 2018-04-12 LAB — PREPARE PLATELET PHERESIS: Unit division: 0

## 2018-04-12 MED ORDER — POTASSIUM CHLORIDE CRYS ER 20 MEQ PO TBCR
20.0000 meq | EXTENDED_RELEASE_TABLET | Freq: Once | ORAL | Status: AC
  Administered 2018-04-12: 20 meq via ORAL
  Filled 2018-04-12: qty 1

## 2018-04-12 MED ORDER — IRBESARTAN 150 MG PO TABS
150.0000 mg | ORAL_TABLET | Freq: Every day | ORAL | Status: DC
  Administered 2018-04-12 – 2018-04-14 (×3): 150 mg via ORAL
  Filled 2018-04-12 (×3): qty 1

## 2018-04-12 NOTE — Progress Notes (Signed)
Physical Therapy Treatment and Discharge Patient Details Name: [name] MRN: [id number] DOB: Sep 21, 1926 Today's Date: 04/12/2018    History of Present Illness 84 yo active gentleman admitted for CABG x 4. PMHx: CAD, HTN, hyperlipidemia, thrombocytopenia, PVD, psoriasis, and prior AAA s/p repair in 1999 with subsequent distal anastomotic pseudoaneurysm     PT Comments    Patient met all of his goals during his admission. Currently ambulating at a modified independent level with RW; this session, he ambulated 450 feet with no device and supervision for safety. No overt LOB or noticeable unsteadiness.Continue to recommend RW for home over uneven terrain and for increased independence/safety.  Able to negotiate 6 steps with supervision and use of railing. Patient with good maintenance of sternal precautions; reviewed precautions with patient's family. No further acute PT needs identified. All education has been completed and the patient/family has no further questions. See below for any follow-up Physical Therapy or equipment needs. PT is signing off.     Follow Up Recommendations  Home health PT;Supervision/Assistance - 24 hour     Equipment Recommendations  Rolling walker with 5" wheels    Recommendations for Other Services       Precautions / Restrictions Precautions Precautions: Sternal;Fall Precaution Booklet Issued: Yes (comment) Precaution Comments: Reviewed sternal precautions with patient family. Patient with good maintenance of precautions throughout treatment. Restrictions Weight Bearing Restrictions: Yes Other Position/Activity Restrictions: Sternal precautions    Mobility  Bed Mobility Overal bed mobility: Independent             General bed mobility comments: Independent with supine to and from sitting  Transfers Overall transfer level: Modified independent Equipment used: None Transfers: Sit to/from Stand Sit to Stand: Modified independent  (Device/Increase time)         General transfer comment: Modified independent with sit to stands with no device and maintained sternal precautions  Ambulation/Gait Ambulation/Gait assistance: Supervision;Modified independent (Device/Increase time) Ambulation Distance (Feet): 450 Feet Assistive device: Rolling walker (2 wheeled);None Gait Pattern/deviations: Narrow base of support;Step-through pattern     General Gait Details: Patient ambulated initial 10 feet with RW, modified independent. Ambulated remaining 450 feet with no device and close supervision. Patient with no overt LOB; VC's for reciprocal arm swing. HR 82-100 bpm with ambulation and stairs.    Stairs Stairs: Yes Stairs assistance: Supervision Stair Management: One rail Right Number of Stairs: 6 General stair comments: Step over step pattern with ascending and step by step pattern with descending. Supervision for safety.   Wheelchair Mobility    Modified Rankin (Stroke Patients Only)       Balance Overall balance assessment: Needs assistance Sitting-balance support: Feet unsupported;No upper extremity supported Sitting balance-Leahy Scale: Good     Standing balance support: During functional activity;Bilateral upper extremity supported Standing balance-Leahy Scale: Good                              Cognition Arousal/Alertness: Awake/alert Behavior During Therapy: WFL for tasks assessed/performed Overall Cognitive Status: Within Functional Limits for tasks assessed                                        Exercises      General Comments General comments (skin integrity, edema, etc.): Reviewed activity recommendations with patient/family.      Pertinent Vitals/Pain Pain Assessment: No/denies pain  Home Living                      Prior Function            PT Goals (current goals can now be found in the care plan section) Acute Rehab PT Goals Patient  Stated Goal: return home PT Goal Formulation: With patient/family Time For Goal Achievement: 04/23/18 Potential to Achieve Goals: Good Progress towards PT goals: Goals met/education completed, patient discharged from PT    Frequency    Min 3X/week      PT Plan      Co-evaluation              AM-PAC PT "6 Clicks" Daily Activity  Outcome Measure  Difficulty turning over in bed (including adjusting bedclothes, sheets and blankets)?: None Difficulty moving from lying on back to sitting on the side of the bed? : None Difficulty sitting down on and standing up from a chair with arms (e.g., wheelchair, bedside commode, etc,.)?: A Little Help needed moving to and from a bed to chair (including a wheelchair)?: A Little Help needed walking in hospital room?: A Little Help needed climbing 3-5 steps with a railing? : A Little 6 Click Score: 20    End of Session Equipment Utilized During Treatment: Gait belt Activity Tolerance: Patient limited by pain Patient left: in bed;with call bell/phone within reach;with family/visitor present   PT Visit Diagnosis: Unsteadiness on feet (R26.81);Other abnormalities of gait and mobility (R26.89);Pain;Muscle weakness (generalized) (M62.81) Pain - part of body: (chest)     Time: 6553-7482 PT Time Calculation (min) (ACUTE ONLY): 20 min  Charges:  $Gait Training: 8-22 mins                    G Codes:       Ellamae Sia, PT, DPT Acute Rehabilitation Services  Pager: [phone]    Willy Eddy 04/12/2018, 2:24 PM

## 2018-04-12 NOTE — Progress Notes (Addendum)
[address]       [place] [address]             [phone]      4 Days Post-Op Procedure(s) (LRB): CORONARY ARTERY BYPASS GRAFTING (CABG) x four, using left mammary artery and right leg greater saphenous vein harvested endoscopically. (N/A) TRANSESOPHAGEAL ECHOCARDIOGRAM (TEE) (N/A) Subjective: Remains nauseous with not much oral intake. Has been walking in the halls.   Objective: Vital signs in last 24 hours: Temp:  [97.3 F (36.3 C)-98.6 F (37 C)] 97.5 F (36.4 C) (04/22 0513) Pulse Rate:  [63-82] 82 (04/22 0513) Cardiac Rhythm: Normal sinus rhythm;Bundle branch block (04/22 0230) Resp:  [15-21] 19 (04/22 0513) BP: (116-150)/(58-83) 150/83 (04/22 0513) SpO2:  [96 %-99 %] 98 % (04/22 0513) Weight:  [159 lb 3.2 oz (72.2 kg)] 159 lb 3.2 oz (72.2 kg) (04/22 0442)     Intake/Output from previous day: 04/21 0701 - 04/22 0700 In: 794 [P.O.:515; Blood:229; IV Piggyback:50] Out: 750 [Urine:750] Intake/Output this shift: No intake/output data recorded.  General appearance: alert, cooperative and no distress Heart: regular rate and rhythm, S1, S2 normal, no murmur, click, rub or gallop Lungs: clear to auscultation bilaterally Abdomen: soft, non-tender; bowel sounds normal; no masses,  no organomegaly Extremities: extremities normal, atraumatic, no cyanosis or edema, petechial rash on bilateral legs.  Wound: clean and dry  Lab Results: Recent Labs    04/11/18 1747 04/12/18 0355  WBC 6.9 3.8*  HGB 9.1* 8.3*  HCT 28.0* 25.3*  PLT 45* 50*   BMET:  Recent Labs    04/11/18 0309 04/12/18 0355  NA 128* 132*  K 3.7 3.4*  CL 96* 99*  CO2 24 25  GLUCOSE 126* 119*  BUN 13 10  CREATININE 0.82 0.90  CALCIUM 7.8* 7.9*    PT/INR:  Recent Labs    04/11/18 1747  LABPROT 15.3*  INR 1.22   ABG    Component Value Date/Time   PHART 7.348 (L) 04/08/2018 1834   HCO3 21.8 04/08/2018 1834   TCO2 22 04/08/2018 1923   ACIDBASEDEF 4.0 (H)  04/08/2018 1834   O2SAT 95.0 04/08/2018 1834   CBG (last 3)  Recent Labs    04/10/18 0805 04/10/18 1214 04/11/18 0438  GLUCAP 133* 127* 125*    Assessment/Plan: S/P Procedure(s) (LRB): CORONARY ARTERY BYPASS GRAFTING (CABG) x four, using left mammary artery and right leg greater saphenous vein harvested endoscopically. (N/A) TRANSESOPHAGEAL ECHOCARDIOGRAM (TEE) (N/A)  1. CV- NSR with 1st degree AV Block, now in NSR, BP climbing- continue Lopressor, added half the home dose of Irbesartan since valsartan isn't on our formulary.  2. Pulm- no acute issues, continue IS 3. Renal- creatinine 0.9, continue lasix, K is a 3.4, will add an extra 20MEQ along with 40MEQ daily 4. GI- Protonix to 40 mg BID, IV pepcid, add prn Maalox, start Reglan for nausea x 4 doses. Positive fecal occult blood. With H and H 8.3/25.3 this morning.  INR 1.22.  5. Chronic Pan thrombocytopenia- plts had been increasing,  50 K this morning. HIT panel pending. 6. Dispo- GI bleed, no further stools today. Ordered another H and H for the morning. Not symptomatic, may need to get GI involved. Remove condom cath today and monitor progress. Work on increasing caloric intake. EPW remain for now.     LOS: 4 days    Elgie Collard 04/12/2018  Chronic thrombocytopenia I have seen and examined [name] and agree with the  above assessment  and plan.  Grace Isaac MD Beeper [phone] Office [phone] 04/12/2018 4:50 PM

## 2018-04-12 NOTE — Plan of Care (Signed)
  Problem: Education: Goal: Knowledge of General Education information will improve Outcome: Progressing   Problem: Health Behavior/Discharge Planning: Goal: Ability to manage health-related needs will improve Outcome: Progressing   Problem: Clinical Measurements: Goal: Ability to maintain clinical measurements within normal limits will improve Outcome: Progressing   

## 2018-04-12 NOTE — Progress Notes (Signed)
CARDIAC REHAB PHASE I   PRE:  Rate/Rhythm: 78 SR    BP: sitting 138/70    SaO2: 98 RA  MODE:  Ambulation: 690 ft   POST:  Rate/Rhythm: 92 SR    BP: sitting 138/102     SaO2: 100 RA  Pt needed reminders for sternal precautions. Steady with RW, long distance, denied problems. Pt needed reminders to stay inside RW on turns. To EOB to eat. VSS. BP 138/102, probably not accurate. Thor, ACSM 04/12/2018 12:22 PM

## 2018-04-13 ENCOUNTER — Other Ambulatory Visit: Payer: Self-pay

## 2018-04-13 DIAGNOSIS — I4891 Unspecified atrial fibrillation: Secondary | ICD-10-CM

## 2018-04-13 LAB — HEMOGLOBIN AND HEMATOCRIT, BLOOD
HCT: 25.2 % — ABNORMAL LOW (ref 39.0–52.0)
Hemoglobin: 8.2 g/dL — ABNORMAL LOW (ref 13.0–17.0)

## 2018-04-13 MED ORDER — AMIODARONE HCL 200 MG PO TABS
400.0000 mg | ORAL_TABLET | Freq: Two times a day (BID) | ORAL | Status: DC
  Administered 2018-04-13 – 2018-04-14 (×3): 400 mg via ORAL
  Filled 2018-04-13 (×3): qty 2

## 2018-04-13 MED ORDER — FAMOTIDINE 20 MG PO TABS
20.0000 mg | ORAL_TABLET | Freq: Two times a day (BID) | ORAL | Status: DC
  Administered 2018-04-13 – 2018-04-14 (×3): 20 mg via ORAL
  Filled 2018-04-13 (×3): qty 1

## 2018-04-13 NOTE — Care Management Important Message (Signed)
Important Message  Patient Details  Name: [name] MRN: [id number] Date of Birth: [birth year]   Medicare Important Message Given:  Yes    Chattie Greeson P Bushnell 04/13/2018, 2:11 PM

## 2018-04-13 NOTE — Progress Notes (Addendum)
[address]       [place] [address]             [phone]      5 Days Post-Op Procedure(s) (LRB): CORONARY ARTERY BYPASS GRAFTING (CABG) x four, using left mammary artery and right leg greater saphenous vein harvested endoscopically. (N/A) TRANSESOPHAGEAL ECHOCARDIOGRAM (TEE) (N/A) Subjective: Feels okay this morning. No symptoms with arrhythmia. Asking to go home.   Objective: Vital signs in last 24 hours: Temp:  [97.9 F (36.6 C)-98.1 F (36.7 C)] 98 F (36.7 C) (04/23 0734) Pulse Rate:  [78-115] 115 (04/23 0734) Cardiac Rhythm: Normal sinus rhythm;Heart block;Bundle branch block (04/23 0700) Resp:  [16-22] 16 (04/23 0734) BP: (129-147)/(69-73) 141/73 (04/23 0734) SpO2:  [99 %-100 %] 100 % (04/23 0734) Weight:  [155 lb 8 oz (70.5 kg)] 155 lb 8 oz (70.5 kg) (04/23 0443)     Intake/Output from previous day: 04/22 0701 - 04/23 0700 In: 930 [P.O.:880; IV Piggyback:50] Out: 6644 [Urine:1125] Intake/Output this shift: No intake/output data recorded.  General appearance: alert, cooperative and no distress Heart: irregularly irregular rhythm Lungs: clear to auscultation bilaterally Abdomen: soft, non-tender; bowel sounds normal; no masses,  no organomegaly Extremities: 1 + pitting edema Wound: clean and dry  Lab Results: Recent Labs    04/11/18 1747 04/12/18 0355 04/13/18 0454  WBC 6.9 3.8*  --   HGB 9.1* 8.3* 8.2*  HCT 28.0* 25.3* 25.2*  PLT 45* 50*  --    BMET:  Recent Labs    04/11/18 0309 04/12/18 0355  NA 128* 132*  K 3.7 3.4*  CL 96* 99*  CO2 24 25  GLUCOSE 126* 119*  BUN 13 10  CREATININE 0.82 0.90  CALCIUM 7.8* 7.9*    PT/INR:  Recent Labs    04/11/18 1747  LABPROT 15.3*  INR 1.22   ABG    Component Value Date/Time   PHART 7.348 (L) 04/08/2018 1834   HCO3 21.8 04/08/2018 1834   TCO2 22 04/08/2018 1923   ACIDBASEDEF 4.0 (H) 04/08/2018 1834   O2SAT 95.0 04/08/2018 1834   CBG (last 3)  Recent Labs   04/10/18 1214 04/11/18 0438  GLUCAP 127* 125*    Assessment/Plan: S/P Procedure(s) (LRB): CORONARY ARTERY BYPASS GRAFTING (CABG) x four, using left mammary artery and right leg greater saphenous vein harvested endoscopically. (N/A) TRANSESOPHAGEAL ECHOCARDIOGRAM (TEE) (N/A)  1. CV-History of 1st degree AV Block, now in afib rate 100s-130s, Continue Lopressor PO, Lopressor 2.5mg  IV x 1, added Amio 400mg  BID PO. Continue Irbesartan since valsartan isn't on our formulary.  2. Pulm- no acute issues, continue IS 3. Renal- creatinine 0.9, continue lasix for mild fluid overload, BMET tomorrow to evaluate electrolytes 4. GI- Protonix to 40 mg BID, IV pepcid, prn Maalox, start Reglan for nausea x 4 doses. Positive fecal occult blood. With H and H 8.2/25.2 this morning and stable.  INR 1.22.  5. Chronic Pan thrombocytopenia- plts had been increasing,  50 K, probably around his baseline. HIT panel pending. 6. Dispo- GI bleed, no further stools today. H and H stable.  Not symptomatic. Work on increasing caloric intake. EPW remain for now. Amio PO for afib and Lopressor 2.5 IV x 1. Monitor rhythm closely. Labs in the AM.      LOS: 5 days    Elgie Collard 04/13/2018  Ambulating well Will d/c pacing wires  Poss home tomorrow  After checking labs I have seen and examined [name] and  agree with the above assessment  and plan.  Grace Isaac MD Beeper [phone] Office [phone] 04/13/2018 8:56 AM

## 2018-04-13 NOTE — Progress Notes (Signed)
CARDIAC REHAB PHASE I   PRE:  Rate/Rhythm: 68 SR    BP: sitting 114/58    SaO2: 100 RA  MODE:  Ambulation: 690 ft   POST:  Rate/Rhythm: 92 SR    BP: sitting 139/71     SaO2: 100 RA  Tolerated well. I lightly held to gait belt but pt walked without AD or assist. No c/o. Legs slilghtly uncontrolled but no LOB. To chair, VSS. [phone]   Darrick Meigs CES, ACSM 04/13/2018 1:55 PM

## 2018-04-13 NOTE — Discharge Summary (Signed)
Physician Discharge Summary  Patient ID: [name] MRN: [id number] DOB/AGE: Oct 27, 1926 84 y.o.  Admit date: 04/08/2018 Discharge date: 04/14/2018  Admission Diagnoses: Severe coronary artery disease  Discharge Diagnoses:  Active Problems:   S/P CABG x 4   Atrial fibrillation [facility])  Patient Active Problem List   Diagnosis Date Noted  . Atrial fibrillation ([facility]) 04/13/2018  . S/P CABG x 4 04/08/2018  . Angina pectoris ([place] [facility])   . Arthritis 01/26/2017  . MDS (myelodysplastic syndrome) ([place]) 09/01/2016  . Thrombocytopenia ([facility]) 08/17/2016  . Aneurysm of abdominal vessel ([facility]) 07/23/2016  . Anemia 05/06/2016  . Benign essential hypertension 08/07/2015  . Bilateral carotid artery stenosis 08/07/2015  . CAD (coronary artery disease) 08/07/2015  . Hyperlipidemia, mixed 08/07/2015  . Personal history of other malignant neoplasm of skin 12/13/2012  . Chronic vascular insufficiency of intestine ([facility]) 05/24/2012  . Abdominal pain, right lower quadrant 05/24/2012  . Occlusion and stenosis of carotid artery without mention of cerebral infarction 04/29/2012    HPI:  The patient is an 84 year old active gentleman with a history of hypertension, hyperlipidemia, thrombocytopenia, and prior abdominal aortic aneurysm treated with open repair in 1999 with subsequent distal anastomotic pseudoaneurysm that is being followed by Dr. Juanda Crumble fields who was recently evaluated by Dr. Irish Lack for complaints of exertional chest discomfort. The patient has been very active and exercises on a treadmill, exercise bike, and lifts weights almost every day. He noted development of chest discomfort while on the treadmill. An electric cardiogram was unchanged. He was started on Imdur and cardiac catheterization was recommended. He tried to go back riding his exercise bike and using his treadmill but developed recurrent chest discomfort and is now stopped doing that. He denies any chest discomfort or shortness of  breath with normal activities around the house. He has had no symptoms at rest. He feels like his energy level has been good until recently. He underwent cardiac catheterization on 03/15/2018 which showed severe three-vessel coronary disease. The LAD was heavily calcified throughout the proximal portion with a 90% proximal stenosis. There was a large diagonal branch that came off after this which had 80% stenosis in it. Beyond the diagonal branch there is about 80% LAD stenosis. The left circumflex was a dominant vessel which had significant proximal stenosis. There was several marginal branches. 1 of them was occluded and filled late by collaterals. This was a fairly large vessel. The right coronary artery was a nondominant vessel with 75% stenosis proximally. The left ventricular ejection fraction was estimated at 65%.  The patient has a history of thrombocytopenia with platelet counts that range in the 50-60,000 range. He has been evaluated by Dr. 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 He had a bone marrow biopsy that was consistent with this. He has a history of a GI bleed in the past.  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 This is been followed by Dr. fields over the past few years and has been stable. His most recent CTA of the abdomen and pelvis on 02/09/2018 shows the width of the pseudoaneurysm to be essentially stable at approximately 17 mm. There is some mural thrombus present within the pseudoaneurysm and enhancing portion had increased somewhat from 7 mm to 11 mm although the overall size was unchanged. It measures 2.4 cm in transverse dimension and 2.2 cm in AP dimension. Dr. Oneida Alar felt that this could be followed  since it has been stable.  Patient also has a history of moderate bilateral carotid artery stenosis with known occlusion of the right vertebral artery and his last carotid ultrasound in  February 2016 showed bilateral 40-59% internal carotid artery stenosis. He is scheduled to have another study done in about 2 months.  The patient is here today with his wife and 2 sons.   After full evaluation of the studies and complete history and physical the patient was felt to be a candidate for CABG and was admitted to the hospital for the procedure.   Discharged Condition: good  Hospital Course: The patient was admitted electively and taken the operating room on 04/08/2018 at which time he underwent the below described procedure.  He tolerated it well and was taken to the surgical intensive care unit in stable condition.  Postoperative hospital course:  Patient has progressed well.  He was extubated neurologically intact using standard postoperative protocols.  All routine lines, monitors and drainage devices were discontinued in standard fashion.  The patient has had postoperative atrial fibrillation but has been chemically cardioverted to normal sinus rhythm with amiodarone and beta-blocker.  Patient has been weaned he maintains good saturations on room air.  Incisions are healing well without evidence of infection.  The patient did develop postoperative melenic stools consistent with blood loss and he has been started on both PPI and H2 blocker.  This has stabilized and most recent hemoglobin and hematocrit are 8.2 /25.2 renal function has remained within normal limits.  Blood sugars have been adequate control using standard measures.  Patient is ambulating well using standard cardiac rehab protocols.  At the time of discharge he is felt to be quite stable.  Consults: None  Significant Diagnostic Studies: Routine postoperative serial chest x-rays and labs.  Treatments: surgery:  CARDIOVASCULAR SURGERY OPERATIVE NOTE  04/08/2018  Surgeon:  Gaye Pollack, MD  First Assistant: Nicholes Rough,  PA-C   Preoperative Diagnosis:  Severe multi-vessel coronary artery  disease   Postoperative Diagnosis:  Same   Procedure:  1. Median Sternotomy 2. Extracorporeal circulation 3.   Coronary artery bypass grafting x 4   Left internal mammary graft to the LAD  SVG to diagonal   SVG to OM2  SVG to AM 4.   Endoscopic vein harvest from the right leg   Anesthesia:  General Endotracheal    Discharge Exam: Blood pressure 136/72, pulse 85, temperature (!) 97.4 F (36.3 C), temperature source Oral, resp. rate 20, weight 155 lb 4.8 oz (70.4 kg), SpO2 99 %.    General appearance: alert, cooperative and no distress Heart: regular rate and rhythm, S1, S2 normal, no murmur, click, rub or gallop Lungs: clear to auscultation bilaterally Abdomen: soft, non-tender; bowel sounds normal; no masses,  no organomegaly Extremities: extremities normal, atraumatic, no cyanosis or edema Wound: clean and dry     Disposition: Discharge disposition: 01-Home or Self Care        Allergies as of 04/14/2018      Reactions   Ciprofloxacin Other (See Comments)   Abdominal Pain      Medication List    STOP taking these medications   felodipine 10 MG 24 hr tablet Commonly known as:  PLENDIL   isosorbide mononitrate 60 MG 24 hr tablet Commonly known as:  IMDUR   oxyCODONE-acetaminophen 5-325 MG tablet Commonly known as:  PERCOCET/ROXICET   valsartan-hydrochlorothiazide 320-25 MG tablet Commonly known as:  DIOVAN-HCT Replaced by:  valsartan-hydrochlorothiazide 160-12.5 MG tablet  TAKE these medications   amiodarone 200 MG tablet Commonly known as:  PACERONE Take two tablets (485m) twice a day for 4 days then take 1 tablet (204m twice a day until we see you in clinic.   aspirin 81 MG EC tablet Take 1 tablet (81 mg total) by mouth daily.   COSAMIN DS PO Take 2 tablets by mouth daily at 12 noon.   famotidine 20 MG tablet Commonly known as:  PEPCID Take 1 tablet (20 mg total) by mouth 2 (two) times daily.   fluticasone 50  MCG/ACT nasal spray Commonly known as:  FLONASE Place 1 spray into both nostrils as needed for allergies or rhinitis.   loratadine 10 MG tablet Commonly known as:  CLARITIN Take 10 mg by mouth daily as needed for allergies.   MAGNESIUM CITRATE PO Take 400 mg by mouth daily after supper.   Melatonin 500 MCG Tbdp Take 500 mcg by mouth at bedtime.   metoprolol tartrate 25 MG tablet Commonly known as:  LOPRESSOR Take 0.5 tablets (12.5 mg total) by mouth 2 (two) times daily.   multivitamin with minerals Tabs tablet Take 1 tablet by mouth daily at 12 noon. Centrum Silver   oxyCODONE 5 MG immediate release tablet Commonly known as:  Oxy IR/ROXICODONE Take 1 tablet (5 mg total) by mouth every 6 (six) hours as needed for severe pain.   polyethylene glycol packet Commonly known as:  MIRALAX / GLYCOLAX Take 17 g by mouth daily. What changed:    when to take this  reasons to take this   PROBIOTIC PO Take 1 capsule by mouth daily with supper.   rosuvastatin 20 MG tablet Commonly known as:  CRESTOR Take 20 mg by mouth at bedtime.   valsartan-hydrochlorothiazide 160-12.5 MG tablet Commonly known as:  DIOVAN HCT Take 1 tablet by mouth daily. Replaces:  valsartan-hydrochlorothiazide 320-25 MG tablet      Follow-up Information    VaJettie BoozeMD Follow up.   Specialties:  Cardiology, Radiology, Interventional Cardiology Why:  Please see discharge paperwork for 2-week follow-up appointment with the cardiology department. Contact information: [id number]. [phone]        BaGaye PollackMD Follow up.   Specialty:  Cardiothoracic Surgery Why:  Your appointment is on 05/05/2018 at 12:00pm. Please arrive at 11:30am for [facility].  Contact information: [phone]      nursing appointment  Follow up.   Why:  Your suture removal appointment is on 04/21/2018 at 10:00am with our nursing staff.  Contact information: Dr. BaVivi Martensffice       [facility] PinkMD. Call in 1 day(s).   Specialty:  Family Medicine Contact information: [address] [phone]        The patient has been discharged on:   1.Beta Blocker:  Yes [ y  ]                              No   [   ]                              If No, reason:  2.Ace Inhibitor/ARB: Yes [ y  ]  No  [    ]                                     If No, reason:  3.Statin:   Yes [ y  ]                  No  [   ]                  If No, reason:  4.Ecasa:  Yes  [ y  ]                  No   [   ]                  If No, reason:  Signed: Elgie Collard 04/14/2018, 7:59 AM

## 2018-04-14 LAB — CBC
HCT: 25.9 % — ABNORMAL LOW (ref 39.0–52.0)
Hemoglobin: 8.4 g/dL — ABNORMAL LOW (ref 13.0–17.0)
MCH: 27.9 pg (ref 26.0–34.0)
MCHC: 32.4 g/dL (ref 30.0–36.0)
MCV: 86 fL (ref 78.0–100.0)
Platelets: 83 10*3/uL — ABNORMAL LOW (ref 150–400)
RBC: 3.01 MIL/uL — ABNORMAL LOW (ref 4.22–5.81)
RDW: 14.3 % (ref 11.5–15.5)
WBC: 4.6 10*3/uL (ref 4.0–10.5)

## 2018-04-14 LAB — BASIC METABOLIC PANEL
Anion gap: 11 (ref 5–15)
BUN: 13 mg/dL (ref 6–20)
CO2: 24 mmol/L (ref 22–32)
Calcium: 8.8 mg/dL — ABNORMAL LOW (ref 8.9–10.3)
Chloride: 100 mmol/L — ABNORMAL LOW (ref 101–111)
Creatinine, Ser: 1.03 mg/dL (ref 0.61–1.24)
GFR calc Af Amer: >60 mL/min (ref >60)
GFR calc non Af Amer: >60 mL/min (ref >60)
Glucose, Bld: 112 mg/dL — ABNORMAL HIGH (ref 65–99)
Potassium: 4.4 mmol/L (ref 3.5–5.1)
Sodium: 135 mmol/L (ref 135–145)

## 2018-04-14 LAB — MAGNESIUM: Magnesium: 2 mg/dL (ref 1.7–2.4)

## 2018-04-14 MED ORDER — ASPIRIN 81 MG PO TBEC: 81.0000 mg | DELAYED_RELEASE_TABLET | Freq: Every day | ORAL | Status: DC

## 2018-04-14 MED ORDER — OXYCODONE HCL 5 MG PO TABS: 5.0000 mg | ORAL_TABLET | Freq: Four times a day (QID) | ORAL | 0 refills | Status: DC | PRN

## 2018-04-14 MED ORDER — VALSARTAN-HYDROCHLOROTHIAZIDE 160-12.5 MG PO TABS: 1.0000 | ORAL_TABLET | Freq: Every day | ORAL | 1 refills | Status: AC

## 2018-04-14 MED ORDER — FAMOTIDINE 20 MG PO TABS: 20.0000 mg | ORAL_TABLET | Freq: Two times a day (BID) | ORAL | 1 refills | Status: DC

## 2018-04-14 MED ORDER — AMIODARONE HCL 200 MG PO TABS: ORAL_TABLET | ORAL | 1 refills | Status: DC

## 2018-04-14 MED ORDER — METOPROLOL TARTRATE 25 MG PO TABS: 12.5000 mg | ORAL_TABLET | Freq: Two times a day (BID) | ORAL | 1 refills | Status: DC

## 2018-04-14 NOTE — Care Management Note (Signed)
Case Management Note Marvetta Gibbons RN, BSN Unit 4E-Case Manager-- [place] coverage [phone]  Patient Details  Name: [name] MRN: [id number] Date of Birth: 07/18/1926  Subjective/Objective:   Pt admitted s/p CABGx4                 Action/Plan: PTA pt lived at home with spouse- PT eval pending- CM to follow for transition of care needs  Expected Discharge Date:  04/14/18               Expected Discharge Plan:  [place]  In-House Referral:  NA  Discharge planning Services  CM Consult  Post Acute Care Choice:  Home Health Choice offered to:  Patient, Adult Children  DME Arranged:  N/A DME Agency:  NA  HH Arranged:  PT HH Agency:  [facility]  Status of Service:  Completed, signed off  If discussed at [place] of Stay Meetings, dates discussed:   Discharge Disposition: home/home health    Additional Comments:  04/14/18- 1145- Lorrin Nawrot RN, CM- pt for d/c home today- spoke with pt/spouse and son at bedside- discussed recommendation by PT eval for HHPT- pt agreeable to services- will have order placed per PA- choice offered for [facility] agency per pt and son they would like to uses [facility] for services- pt states he has a RW at home to use- son reports the he or pt's other son will be staying with pt for the next several weeks taking turns. Referral called to Butch Penny with [facility] for HHPT needs- referral has been accepted- pt's address and phone # confirmed in epic  Dawayne Patricia, RN 04/14/2018, 11:48 AM

## 2018-04-14 NOTE — Progress Notes (Signed)
      [address]       [place] [address]             [phone]      6 Days Post-Op Procedure(s) (LRB): CORONARY ARTERY BYPASS GRAFTING (CABG) x four, using left mammary artery and right leg greater saphenous vein harvested endoscopically. (N/A) TRANSESOPHAGEAL ECHOCARDIOGRAM (TEE) (N/A) Subjective: Appetite is still not back but he states he will eat better at home.  Objective: Vital signs in last 24 hours: Temp:  [97.3 F (36.3 C)-98 F (36.7 C)] 97.4 F (36.3 C) (04/24 0551) Pulse Rate:  [74-130] 85 (04/23 2213) Cardiac Rhythm: Normal sinus rhythm;Bundle branch block (04/23 1930) Resp:  [16-20] 20 (04/24 0551) BP: (114-141)/(58-73) 136/72 (04/24 0551) SpO2:  [98 %-100 %] 99 % (04/24 0551) Weight:  [155 lb 4.8 oz (70.4 kg)] 155 lb 4.8 oz (70.4 kg) (04/24 0551)     Intake/Output from previous day: 04/23 0701 - 04/24 0700 In: 480 [P.O.:480] Out: -  Intake/Output this shift: No intake/output data recorded.  General appearance: alert, cooperative and no distress Heart: regular rate and rhythm, S1, S2 normal, no murmur, click, rub or gallop Lungs: clear to auscultation bilaterally Abdomen: soft, non-tender; bowel sounds normal; no masses,  no organomegaly Extremities: extremities normal, atraumatic, no cyanosis or edema Wound: clean and dry  Lab Results: Recent Labs    04/12/18 0355 04/13/18 0454 04/14/18 0308  WBC 3.8*  --  4.6  HGB 8.3* 8.2* 8.4*  HCT 25.3* 25.2* 25.9*  PLT 50*  --  83*   BMET:  Recent Labs    04/12/18 0355 04/14/18 0308  NA 132* 135  K 3.4* 4.4  CL 99* 100*  CO2 25 24  GLUCOSE 119* 112*  BUN 10 13  CREATININE 0.90 1.03  CALCIUM 7.9* 8.8*    PT/INR:  Recent Labs    04/11/18 1747  LABPROT 15.3*  INR 1.22   ABG    Component Value Date/Time   PHART 7.348 (L) 04/08/2018 1834   HCO3 21.8 04/08/2018 1834   TCO2 22 04/08/2018 1923   ACIDBASEDEF 4.0 (H) 04/08/2018 1834   O2SAT 95.0 04/08/2018 1834   CBG  (last 3)  No results for input(s): GLUCAP in the last 72 hours.  Assessment/Plan: S/P Procedure(s) (LRB): CORONARY ARTERY BYPASS GRAFTING (CABG) x four, using left mammary artery and right leg greater saphenous vein harvested endoscopically. (N/A) TRANSESOPHAGEAL ECHOCARDIOGRAM (TEE) (N/A)  1. CV-History of 1st degree AV Block and afib, now NSR, Continue Lopressor PO,  Amio 400mg  BID PO. Continue Irbesartan at reduced dose.  2. Pulm- no acute issues, continue IS 3. Renal- creatinine0.9, continue lasix for mild fluid overload 4. GI-Protonix to 40 mg BID,IV pepcid,prn Maalox. Positive fecal occult blood. With H and H 8.4/25.9 this morning and stable.INR 1.22.  5. Chronic Pan thrombocytopenia- plts had been increasing,83k.  6. Dispo-GI bleed, no further stools. H and H stable.  Not symptomatic. Work on increasing caloric intake. EPW out today. Labs look okay this AM. Discharge 4 hours post EPW removal.     LOS: 6 days    Elgie Collard 04/14/2018

## 2018-04-14 NOTE — Progress Notes (Signed)
Ed completed with pt, son, and wife. Receptive, son with many questions. Will refer to [place]. Left video to watch.  [id number] [place], ACSM 11:39 AM 04/14/2018

## 2018-04-14 NOTE — Progress Notes (Signed)
Pt ambulated 470 ft in the hall this morning. Tolerated well. Will continue to monitor.  Fransico Michael RN

## 2018-04-14 NOTE — Discharge Instructions (Signed)
Atrial Fibrillation Atrial fibrillation is a type of heartbeat that is irregular or fast (rapid). If you have this condition, your heart keeps quivering in a weird (chaotic) way. This condition can make it so your heart cannot pump blood normally. Having this condition gives a person more risk for stroke, heart failure, and other heart problems. There are different types of atrial fibrillation. Talk with your doctor to learn about the type that you have. Follow these instructions at home:  Take over-the-counter and prescription medicines only as told by your doctor.  If your doctor prescribed a blood-thinning medicine, take it exactly as told. Taking too much of it can cause bleeding. If you do not take enough of it, you will not have the protection that you need against stroke and other problems.  Do not use any tobacco products. These include cigarettes, chewing tobacco, and e-cigarettes. If you need help quitting, ask your doctor.  If you have apnea (obstructive sleep apnea), manage it as told by your doctor.  Do not drink alcohol.  Do not drink beverages that have caffeine. These include coffee, soda, and tea.  Maintain a healthy weight. Do not use diet pills unless your doctor says they are safe for you. Diet pills may make heart problems worse.  Follow diet instructions as told by your doctor.  Exercise regularly as told by your doctor.  Keep all follow-up visits as told by your doctor. This is important. Contact a doctor if:  You notice a change in the speed, rhythm, or strength of your heartbeat.  You are taking a blood-thinning medicine and you notice more bruising.  You get tired more easily when you move or exercise. Get help right away if:  You have pain in your chest or your belly (abdomen).  You have sweating or weakness.  You feel sick to your stomach (nauseous).  You notice blood in your throw up (vomit), poop (stool), or pee (urine).  You are short of  breath.  You suddenly have swollen feet and ankles.  You feel dizzy.  Your suddenly get weak or numb in your face, arms, or legs, especially if it happens on one side of your body.  You have trouble talking, trouble understanding, or both.  Your face or your eyelid droops on one side. These symptoms may be an emergency. Do not wait to see if the symptoms will go away. Get medical help right away. Call your local emergency services (911 in the [place] Do not drive yourself to the hospital. This information is not intended to replace advice given to you by your health care provider. Make sure you discuss any questions you have with your health care provider. Document Released: 09/16/2008 Document Revised: 05/15/2016 Document Reviewed: 04/04/2015 Elsevier Interactive Patient Education  2018 [place]. Coronary Artery Bypass Grafting, Care After These instructions give you information on caring for yourself after your procedure. Your doctor may also give you more specific instructions. Call your doctor if you have any problems or questions after your procedure. Follow these instructions at home:  Only take medicine as told by your doctor. Take medicines exactly as told. Do not stop taking medicines or start any new medicines without talking to your doctor first.  Take your pulse as told by your doctor.  Do deep breathing as told by your doctor. Use your breathing device (incentive spirometer), if given, to practice deep breathing several times a day. Support your chest with a pillow or your arms when you take deep  breaths or cough.  Keep the area clean, dry, and protected where the surgery cuts (incisions) were made. Remove bandages (dressings) only as told by your doctor. If strips were applied to surgical area, do not take them off. They fall off on their own.  Check the surgery area daily for puffiness (swelling), redness, or leaking fluid.  If surgery cuts were made in your  legs: ? Avoid crossing your legs. ? Avoid sitting for long periods of time. Change positions every 30 minutes. ? Raise your legs when you are sitting. Place them on pillows.  Wear stockings that help keep blood clots from forming in your legs (compression stockings).  Only take sponge baths until your doctor says it is okay to take showers. Pat the surgery area dry. Do not rub the surgery area with a washcloth or towel. Do not bathe, swim, or use a hot tub until your doctor says it is okay.  Eat foods that are high in fiber. These include raw fruits and vegetables, whole grains, beans, and nuts. Choose lean meats. Avoid canned, processed, and fried foods.  Drink enough fluids to keep your pee (urine) clear or pale yellow.  Weigh yourself every day.  Rest and limit activity as told by your doctor. You may be told to: ? Stop any activity if you have chest pain, shortness of breath, changes in heartbeat, or dizziness. Get help right away if this happens. ? Move around often for short amounts of time or take short walks as told by your doctor. Gradually become more active. You may need help to strengthen your muscles and build endurance. ? Avoid lifting, pushing, or pulling anything heavier than 10 pounds (4.5 kg) for at least 6 weeks after surgery.  Do not drive until your doctor says it is okay.  Ask your doctor when you can go back to work.  Ask your doctor when you can begin sexual activity again.  Follow up with your doctor as told. Contact a doctor if:  You have puffiness, redness, more pain, or fluid draining from the incision site.  You have a fever.  You have puffiness in your ankles or legs.  You have pain in your legs.  You gain 2 or more pounds (0.9 kg) a day.  You feel sick to your stomach (nauseous) or throw up (vomit).  You have watery poop (diarrhea). Get help right away if:  You have chest pain that goes to your jaw or arms.  You have shortness of  breath.  You have a fast or irregular heartbeat.  You notice a "clicking" in your breastbone when you move.  You have numbness or weakness in your arms or legs.  You feel dizzy or light-headed. This information is not intended to replace advice given to you by your health care provider. Make sure you discuss any questions you have with your health care provider. Document Released: 12/13/2013 Document Revised: 05/15/2016 Document Reviewed: 05/17/2013 Elsevier Interactive Patient Education  2017 [employer].   .rx

## 2018-04-14 NOTE — Progress Notes (Signed)
Discussed with the patient and all questioned fully answered. He will call me if any problems arise.  IV removed. Telemetry removed. Sutures intact, appointment scheduled for removal. Pt son and wife at bedside at time of teaching, all questions answered.   Pt given paper prescriptions including oxycodone in discharge envelope.   Fritz Pickerel, RN

## 2018-04-14 NOTE — Progress Notes (Signed)
EPW removed per protocol. Pt verbalized understanding of 1 hour bed rest until 11am. VSS. Per orders can discharge 4 hours after removal at 2pm. Assisted by Elane Fritz.   Fritz Pickerel, RN

## 2018-04-21 ENCOUNTER — Ambulatory Visit (INDEPENDENT_AMBULATORY_CARE_PROVIDER_SITE_OTHER): Payer: Self-pay

## 2018-04-21 DIAGNOSIS — I2510 Atherosclerotic heart disease of native coronary artery without angina pectoris: Secondary | ICD-10-CM

## 2018-04-21 DIAGNOSIS — G8918 Other acute postprocedural pain: Secondary | ICD-10-CM

## 2018-04-21 DIAGNOSIS — Z951 Presence of aortocoronary bypass graft: Secondary | ICD-10-CM

## 2018-04-21 DIAGNOSIS — Z4802 Encounter for removal of sutures: Secondary | ICD-10-CM

## 2018-04-21 MED ORDER — OXYCODONE HCL 5 MG PO TABS: 5.0000 mg | ORAL_TABLET | Freq: Four times a day (QID) | ORAL | 0 refills | Status: DC | PRN

## 2018-04-21 NOTE — Progress Notes (Signed)
Removed 3 sutures from chest tube incision sites, no signs of infection and patient tolerated well. Refill pain medication signed by Dr Cyndia Bent

## 2018-04-22 ENCOUNTER — Telehealth: Payer: Self-pay

## 2018-04-22 NOTE — Telephone Encounter (Signed)
Mr Vick called concerned about his BP levels running high- 150/88 and 138/78. He is s/p CABG 04/08/18. I told him that those numbers were not concerning but to start keeping a written record of his BP's. He can check them 2-3 times a day. And bring reading to Cardiologist appt. He was also concerned about this descending aneurysm being affected from the one high systolic reading. I recommended again to check BP's daily and if they start or continue to be elevated to call us back or his Cardiologist. He also see's Dr Oneida Alar for aneurysm. He is scheduled to see Dr Cyndia Bent for 1st post op check on 05/05/2018.

## 2018-04-26 ENCOUNTER — Telehealth: Payer: Self-pay | Admitting: Interventional Cardiology

## 2018-04-26 ENCOUNTER — Encounter: Payer: Self-pay | Admitting: Surgical

## 2018-04-26 ENCOUNTER — Ambulatory Visit (INDEPENDENT_AMBULATORY_CARE_PROVIDER_SITE_OTHER): Payer: Self-pay | Admitting: Surgical

## 2018-04-26 VITALS — BP 140/76 | HR 64 | Temp 97.3°F | Resp 20 | Ht 69.0 in | Wt 154.0 lb

## 2018-04-26 DIAGNOSIS — I2510 Atherosclerotic heart disease of native coronary artery without angina pectoris: Secondary | ICD-10-CM

## 2018-04-26 DIAGNOSIS — Z951 Presence of aortocoronary bypass graft: Secondary | ICD-10-CM

## 2018-04-26 DIAGNOSIS — S93401D Sprain of unspecified ligament of right ankle, subsequent encounter: Secondary | ICD-10-CM

## 2018-04-26 NOTE — Telephone Encounter (Signed)
Returned call to Pt's son.  Pt had recent CABG with Dr. Cyndia Bent.  Pt had a fall within last few days with bad sprain.  Ankle feeling better, now Pt is having trouble with the site where the surgeon's harvested Pt vein for bypass.  Son states area (near ankle) is red, swollen with a hard knot.   Advised son to call [facility] to have wound check.  Son indicates understanding.

## 2018-04-26 NOTE — Progress Notes (Signed)
[address]       [place] [address]             [phone]      Zakaria M Bressman McCune Medical Record #[id number] Date of Birth: December 12, 1926  Referring: Jettie Booze, MD Primary Care: [name], MD Primary Cardiologist: Larae Grooms, MD   Chief Complaint:   POST OP FOLLOW UP                                                                                     CARDIOVASCULAR SURGERY OPERATIVE NOTE  04/08/2018  Surgeon:  Gaye Pollack, MD  First Assistant: Nicholes Rough,  PA-C   Preoperative Diagnosis:  Severe multi-vessel coronary artery disease   Postoperative Diagnosis:  Same   Procedure:  1. Median Sternotomy 2. Extracorporeal circulation 3.   Coronary artery bypass grafting x 4   Left internal mammary graft to the LAD  SVG to diagonal   SVG to OM2  SVG to AM 4.   Endoscopic vein harvest from the right leg   Anesthesia:  General Endotracheal     History of Present Illness:    Patient is an 84 year old male status post the above described procedure.  Few days ago he had an episode where he sprained his right ankle.  He has been elevating it but did not apply any kind of compression dressing.  He has noted some increase in swelling and also a little bit of pain in the lower area of the [facility] site.  Additionally his son states that he just has been slow to get back to his former preoperative self feeling poorly most of the day.  Appetite has not been very good either.  He has not had any fevers, chills or other significant constitutional symptoms.      Past Medical History:  Diagnosis Date  . AAA (abdominal aortic aneurysm) ([place]) 1999  . Adenomatous colon polyp   . 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 Aortic aneurysm ([place]) 1999   repair  . Arthritis   . Basal cell cancer Dec. 24, 2013   Nose  . CAD (coronary artery disease)   . Cancer [facility]) Aug. 2013   prostate Seeding  implant  . Carotid artery  occlusion   . Enlarged prostate 2008   Thermotherapy  . Gastroesophageal reflux disease   . Hyperlipidemia   . Hypertension   . Myelodysplasia (myelodysplastic syndrome) ([place])   . Pancytopenia ([place])    sees Dr. Grayland Ormond  . Peripheral vascular disease ([place])   . Psoriasis      Social History   Tobacco Use  Smoking Status Former Smoker  . Types: Cigarettes  . Last attempt to quit: 04/29/1997  . Years since quitting: 21.0  Smokeless Tobacco Never Used    Social History   Substance and Sexual Activity  Alcohol Use No     Allergies  Allergen Reactions  . Ciprofloxacin Other (See Comments)    Abdominal Pain     Current Outpatient Medications  Medication Sig Dispense Refill  . amiodarone (PACERONE) 200 MG tablet Take two tablets (400mg ) twice a day for 4  days then take 1 tablet (200mg ) twice a day until we see you in clinic. (Patient taking differently: Take 200 mg by mouth daily. Take two tablets (400mg ) twice a day for 4 days then take 1 tablet (200mg ) twice a day until we see you in clinic.) 80 tablet 1  . aspirin EC 81 MG EC tablet Take 1 tablet (81 mg total) by mouth daily.    . famotidine (PEPCID) 20 MG tablet Take 1 tablet (20 mg total) by mouth 2 (two) times daily. 60 tablet 1  . Glucosamine-Chondroitin (COSAMIN DS PO) Take 2 tablets by mouth daily at 12 noon.    Marland Kitchen MAGNESIUM CITRATE PO Take 400 mg by mouth daily after supper.     . Melatonin 500 MCG TBDP Take 500 mcg by mouth at bedtime.    . metoprolol tartrate (LOPRESSOR) 25 MG tablet Take 0.5 tablets (12.5 mg total) by mouth 2 (two) times daily. 60 tablet 1  . Multiple Vitamin (MULTIVITAMIN WITH MINERALS) TABS tablet Take 1 tablet by mouth daily at 12 noon. Centrum Silver    . oxyCODONE (OXY IR/ROXICODONE) 5 MG immediate release tablet Take 1 tablet (5 mg total) by mouth every 6 (six) hours as needed for severe pain. 30 tablet 0  . polyethylene glycol (MIRALAX / GLYCOLAX) packet Take 17 g by mouth daily. (Patient  taking differently: Take 17 g by mouth every other day as needed for moderate constipation. ) 14 each 0  . rosuvastatin (CRESTOR) 20 MG tablet Take 20 mg by mouth at bedtime.     . valsartan-hydrochlorothiazide (DIOVAN HCT) 160-12.5 MG tablet Take 1 tablet by mouth daily. 30 tablet 1   No current facility-administered medications for this visit.        Physical Exam: BP 140/76   Pulse 64   Temp (!) 97.3 F (36.3 C) (Oral)   Resp 20   Ht 5\' 9"  (1.753 m)   Wt 69.9 kg (154 lb)   SpO2 96% Comment: RA  BMI 22.74 kg/m   General appearance: alert, cooperative, fatigued and no distress Heart: regular rate and rhythm Lungs: clear to auscultation bilaterally Abdomen: Benign Extremities: The right ankle shows some swelling and lower leg is edematous.  He appears to have grossly normal range of motion of the ankle.  There is some slight tenderness and minimal erythema of the lower portion of the [facility] site. Wound: The sternal incision is healing well without evidence of infection.   Diagnostic Studies & Laboratory data:     Recent Radiology Findings:   No results found.    Recent Lab Findings: Lab Results  Component Value Date   WBC 4.6 04/14/2018   HGB 8.4 (L) 04/14/2018   HCT 25.9 (L) 04/14/2018   PLT 83 (L) 04/14/2018   GLUCOSE 112 (H) 04/14/2018   ALT 16 (L) 04/12/2018   AST 29 04/12/2018   NA 135 04/14/2018   K 4.4 04/14/2018   CL 100 (L) 04/14/2018   CREATININE 1.03 04/14/2018   BUN 13 04/14/2018   CO2 24 04/14/2018   INR 1.22 04/11/2018   HGBA1C 5.6 04/06/2018      Assessment / Plan: The patient has a right sided ankle sprain and the high have advised the family that he should probably be seen by orthopedics for an urgent care center with x-ray facility.  There is no way to rule out a potential fracture in this setting.  They understand and will obtain an appointment.  As to his generally feeling poorly  postoperatively I feel it would be safe at this time to stop  his amiodarone as it is probably impairing his appetite significantly.  I also stated to them that I would be comfortable stopping Crestor as it may make him feel better as well in case some of his generalized issues are related to this medication.  He has a scheduled appointment see Dr. Cyndia Bent in a couple weeks and we will keep this appointment.          John Giovanni, PA-C 04/26/2018 3:23 PM

## 2018-04-26 NOTE — Telephone Encounter (Signed)
New Message   Pt's son states that the pt had surgery with Dr. Cyndia Bent on 4/18 and then he sprained his ankle, no his surgical site looks infected. Please call

## 2018-04-27 ENCOUNTER — Encounter: Payer: Self-pay | Admitting: Physician Assistant

## 2018-04-27 NOTE — Progress Notes (Deleted)
Cardiology Office Note    Date:  04/27/2018  ID:  [name], DOB 1926-01-22, MRN [id number] PCP:  [name], MD  Cardiologist:  Dr. Juliane Poot?   Chief Complaint: f/u CABG  History of Present Illness:  [name] is a 84 y.o. male with history of hypertension, hyperlipidemia, thrombocytopenia, abdominal aortic aneurysm treated with open repair in 1999 with subsequent distal anastomotic pseudoaneurysm that is being followed by Dr. Oneida Alar, carotid artery disease followed by vascular surgery, recent CAD s/p CABG, mild AS by echo 04/02/18 who presents for post-hospital follow-up.  The patient has a history of thrombocytopenia with platelet counts that range in the 50-60,000 range. He has been evaluated by Dr. 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 He had a bone marrow biopsy that was consistent with this. He has a history of a GI bleed in the past. He historically has been very active but recently developed angina with exertion. Cardiac cath showed 3V CAD. 2D echo 04/02/18 - EF 65-70%, grade 1 DD, mild AS/MR, mild LAE. He underwent CABGx4 on 04/08/18 with Left internal mammary graft to the LAD, SVG to diagonal, SVG to OM2, SVG to AM. He developed postoperative atrial fibrillation but was chemically cardioverted to normal sinus rhythm with amiodarone and beta-blocker.  The patient did develop postoperative melenic stools consistent with blood loss and was started on both PPI and H2 blocker. This was felt to stabilize at time of DC. Last labs showed Hgb 8.4, Plt 82, Cr 1.03, K 4.4, albumin 3.0, AST/ALT not elevated, PT 15.3, hemoccult positive. No recent lipid profile on file.  Gi bleed [facility] stopped amio and crestor? Lipids---crestor stopped?  CAD HTN Hyperlipidemia Mild AS Post-op Afib    Past Medical History:  Diagnosis Date  . AAA (abdominal aortic aneurysm) ([place]) 1999   a. treated with open repair in 1999 with subsequent distal  anastomotic pseudoaneurysm that is being followed by Dr. Oneida Alar.  . Adenomatous colon polyp   . Aorta aneurysm ([place])   . Arthritis   . Basal cell cancer Dec. 24, 2013   Nose  . CAD (coronary artery disease)    a. s/p CABG 03/2018.  Marland Kitchen Cancer [facility]) Aug. 2013   prostate Seeding  implant  . Carotid artery occlusion   . Enlarged prostate 2008   Thermotherapy  . Gastroesophageal reflux disease   . GI bleed   . Hyperlipidemia   . Hypertension   . Mild aortic stenosis   . Myelodysplasia (myelodysplastic syndrome) ([place])   . Pancytopenia ([place])    sees Dr. Grayland Ormond  . Peripheral vascular disease ([place])   . Postoperative atrial fibrillation ([place])   . Psoriasis   . S/P CABG (coronary artery bypass graft)     Past Surgical History:  Procedure Laterality Date  . ABDOMINAL AORTIC ANEURYSM REPAIR  1999  . CARDIAC CATHETERIZATION  10/2009  . CHOLECYSTECTOMY    . CORONARY ARTERY BYPASS GRAFT N/A 04/08/2018   Procedure: CORONARY ARTERY BYPASS GRAFTING (CABG) x four, using left mammary artery and right leg greater saphenous vein harvested endoscopically.;  Surgeon: Gaye Pollack, MD;  [facility];  Service: Open Heart Surgery;  Laterality: N/A;  . ESOPHAGOGASTRODUODENOSCOPY (EGD) WITH PROPOFOL N/A 06/26/2016   Procedure: ESOPHAGOGASTRODUODENOSCOPY (EGD) WITH PROPOFOL;  Surgeon: Lollie Sails, MD;  Location: [facility] ENDOSCOPY;  Service: Endoscopy;  Laterality: N/A;  . LEFT HEART CATH AND CORONARY ANGIOGRAPHY N/A 03/15/2018   Procedure: LEFT HEART CATH AND CORONARY ANGIOGRAPHY;  Surgeon: Jettie Booze, MD;  Location: [place] CV LAB;  Service: Cardiovascular;  Laterality: N/A;  . PROSTATE SURGERY    . SPINE SURGERY  2009   ruptured disc  . TEE WITHOUT CARDIOVERSION N/A 04/08/2018   Procedure: TRANSESOPHAGEAL ECHOCARDIOGRAM (TEE);  Surgeon: Gaye Pollack, MD;  Location: [place];  Service: Open Heart Surgery;  Laterality: N/A;  . Visceral angiogram  05/14/2012   Right  groin area  .  VISCERAL ANGIOGRAM N/A 05/14/2012   Procedure: VISCERAL ANGIOGRAM;  Surgeon: Elam Dutch, MD;  Location: [facility] CATH LAB;  Service: Cardiovascular;  Laterality: N/A;    Current Medications: No outpatient medications have been marked as taking for the 04/29/18 encounter (Appointment) with Charlie Pitter, PA-C.   ***   Allergies:   Ciprofloxacin   Social History   Socioeconomic History  . Marital status: Married    Spouse name: Not on file  . Number of children: Not on file  . Years of education: Not on file  . Highest education level: Not on file  Occupational History  . Not on file  Social Needs  . Financial resource strain: Not on file  . Food insecurity:    Worry: Not on file    Inability: Not on file  . Transportation needs:    Medical: Not on file    Non-medical: Not on file  Tobacco Use  . Smoking status: Former Smoker    Types: Cigarettes    Last attempt to quit: 04/29/1997    Years since quitting: 21.0  . Smokeless tobacco: Never Used  Substance and Sexual Activity  . Alcohol use: No  . Drug use: No  . Sexual activity: Not on file  Lifestyle  . Physical activity:    Days per week: Not on file    Minutes per session: Not on file  . Stress: Not on file  Relationships  . Social connections:    Talks on phone: Not on file    Gets together: Not on file    Attends religious service: Not on file    Active member of club or organization: Not on file    Attends meetings of clubs or organizations: Not on file    Relationship status: Not on file  Other Topics Concern  . Not on file  Social History Narrative  . Not on file     Family History:  Family History  Problem Relation Age of Onset  . Heart attack Father   . Heart disease Father        After age 84  . COPD Father   . Cancer Sister        Breast and Brain  . Heart disease Brother        before age 13  . Heart attack Brother    ***  ROS:   Please see the history of present illness. Otherwise, review  of systems is positive for ***.  All other systems are reviewed and otherwise negative.    PHYSICAL EXAM:   VS:  There were no vitals taken for this visit.  BMI: There is no height or weight on file to calculate BMI. GEN: Well nourished, well developed, in no acute distress  HEENT: normocephalic, atraumatic Neck: no JVD, carotid bruits, or masses Cardiac: ***RRR; no murmurs, rubs, or gallops, no edema  Respiratory:  clear to auscultation bilaterally, normal work of breathing GI: soft, nontender, nondistended, + BS MS: no deformity or atrophy  Skin: warm and dry,  no rash Neuro:  Alert and Oriented x 3, Strength and sensation are intact, follows commands Psych: euthymic mood, full affect  Wt Readings from Last 3 Encounters:  04/26/18 154 lb (69.9 kg)  04/14/18 155 lb 4.8 oz (70.4 kg)  04/06/18 155 lb 3.2 oz (70.4 kg)      Studies/Labs Reviewed:   EKG:  EKG was ordered today and personally reviewed by me and demonstrates *** EKG was not ordered today.***  Recent Labs: 04/12/2018: ALT 16 04/14/2018: BUN 13; Creatinine, Ser 1.03; Hemoglobin 8.4; Magnesium 2.0; Platelets 83; Potassium 4.4; Sodium 135   Lipid Panel No results found for: CHOL, TRIG, HDL, CHOLHDL, VLDL, LDLCALC, LDLDIRECT  Additional studies/ records that were reviewed today include: Summarized above.***    ASSESSMENT & PLAN:   1. ***  Disposition: F/u with ***   Medication Adjustments/Labs and Tests Ordered: Current medicines are reviewed at length with the patient today.  Concerns regarding medicines are outlined above. Medication changes, Labs and Tests ordered today are summarized above and listed in the Patient Instructions accessible in Encounters.   Signed, Charlie Pitter, PA-C  04/27/2018 5:20 PM    [facility] [place], [place], [place]  [address] Phone: [phone]; Fax: [phone]

## 2018-04-28 ENCOUNTER — Ambulatory Visit (INDEPENDENT_AMBULATORY_CARE_PROVIDER_SITE_OTHER): Payer: Medicare Other | Admitting: Cardiovascular Disease

## 2018-04-28 ENCOUNTER — Encounter: Payer: Self-pay | Admitting: Cardiovascular Disease

## 2018-04-28 ENCOUNTER — Telehealth: Payer: Self-pay | Admitting: Interventional Cardiology

## 2018-04-28 VITALS — BP 130/52 | HR 58 | Ht 69.0 in | Wt 153.0 lb

## 2018-04-28 DIAGNOSIS — I480 Paroxysmal atrial fibrillation: Secondary | ICD-10-CM

## 2018-04-28 DIAGNOSIS — I25119 Atherosclerotic heart disease of native coronary artery with unspecified angina pectoris: Secondary | ICD-10-CM

## 2018-04-28 DIAGNOSIS — R0602 Shortness of breath: Secondary | ICD-10-CM | POA: Diagnosis not present

## 2018-04-28 MED ORDER — CEPHALEXIN 500 MG PO CAPS: 500.0000 mg | ORAL_CAPSULE | Freq: Three times a day (TID) | ORAL | 0 refills | Status: AC

## 2018-04-28 MED ORDER — TRAMADOL HCL 50 MG PO TABS: 50.0000 mg | ORAL_TABLET | Freq: Four times a day (QID) | ORAL | 0 refills | Status: DC | PRN

## 2018-04-28 NOTE — Telephone Encounter (Signed)
Returned call to patient who is unable to speak on the phone due to SOB. Spoke with son who is with the patient. Patient s/p CABGx4 on 4/18 with post op Afib. Patient was recently seen by PA at [facility] on 5/6. Patient's Amio and Crestor were both d/c'd due to the patient's  decreased appetite and lack of energy. Patient's son states that the patient has had increased SOB today as well as increased CP located in the center of his chest that he states is the worst it has been post-op. He denies radiation and states that the oxycodone has not helped. Son also states that the patient's HR has been irregular today ranging from 40-65. Patient's BP 111/60. Patient's BP normally 140/70 HR 60-70s. Patient's son states that he has had no energy. Son states that he is concerned as this is the first time he has been irregular since being d/c'd and now with increased SOB and chest pain today. Patient has an appointment with DD tomorrow. Discussed with Dr. Burt Knack, [place], who agrees to see the patient this afternoon at 3:20 PM so that we can perform an EKG and evaluate the patient further. Patient and son agree to come in for the appointment.

## 2018-04-28 NOTE — Patient Instructions (Addendum)
Medication Instructions:  1) you have been given a prescription for Tramadol 50 mg. You may take 1 tablet every 6 hours as needed. 2) START KEFLEX 500 mg THREE TIMES DAILY for 7 days  Labwork: TODAY: CBC, BMET  Testing/Procedures: Dr. Burt Knack recommends you have a CHEST XRAY. Please proceed to [place] at [facility] to complete. You do not need an appointment as long as you go during regular business hours.  Follow-Up: Keep your appointment with Dr. Cyndia Bent next week. If your surgical site does not look better, call Dr. Vivi Martens office.   Any Other Special Instructions Will Be Listed Below (If Applicable).     If you need a refill on your cardiac medications before your next appointment, please call your pharmacy.

## 2018-04-28 NOTE — Telephone Encounter (Signed)
New Message    Patient c/o Palpitations:  High priority if patient c/o lightheadedness, shortness of breath, or chest pain  1) How long have you had palpitations/irregular HR/ Afib? Are you having the symptoms now? Irregular heartbeat   2) Are you currently experiencing lightheadedness, SOB or CP? Shortness of breath  3) Do you have a history of afib (atrial fibrillation) or irregular heart rhythm? Yes, irregular heartbeat  4) Have you checked your BP or HR? (document readings if available): HR 49. He said the monitor indicates irregular heartbeat. BP 111/60  5) Are you experiencing any other symptoms? Chest incision looks redder. Some chest pressure.

## 2018-04-28 NOTE — Progress Notes (Signed)
Cardiology Office Note Date:  04/28/2018   ID:  [name], DOB January 12, 1926, MRN [id number]  PCP:  [name], MD  Cardiologist:  Sherren Mocha, MD    Chief Complaint  Patient presents with  . Shortness of Breath     History of Present Illness: [name] is a 84 y.o. male who presents for evaluation of shortness of breath and fatigue.  The patient underwent elective multivessel CABG April 08, 2018.  He had postoperative atrial fibrillation and was discharged on amiodarone.  He also had report of melena but his anemia stabilized prior to discharge on PPI and H2 blocker therapy.  Since discharge home he has been slow to progress.  His family has become increasingly concerned because he has been "listless" especially over the last few days.  The patient complains of soreness around his sternotomy and describes this as a "deep" chest pain.  He also complains of shortness of breath.  No orthopnea or PND.  There is some swelling in the right leg that appears to be related to vein harvesting.  He has noticed some redness around the upper part of his sternotomy over the last 24 to 48 hours.  Past Medical History:  Diagnosis Date  . AAA (abdominal aortic aneurysm) ([place]) 1999   a. treated with open repair in 1999 with subsequent distal anastomotic pseudoaneurysm that is being followed by Dr. Oneida Alar.  . Adenomatous colon polyp   . Aorta aneurysm ([place])   . Arthritis   . Basal cell cancer Dec. 24, 2013   Nose  . CAD (coronary artery disease)    a. s/p CABG 03/2018.  Marland Kitchen Cancer [facility]) Aug. 2013   prostate Seeding  implant  . Carotid artery occlusion   . Enlarged prostate 2008   Thermotherapy  . Gastroesophageal reflux disease   . GI bleed   . Hyperlipidemia   . Hypertension   . Mild aortic stenosis   . Myelodysplasia (myelodysplastic syndrome) ([place])   . Pancytopenia ([place])    sees Dr. Grayland Ormond  . Peripheral vascular disease ([place])   . Postoperative atrial fibrillation  ([place])   . Psoriasis   . S/P CABG (coronary artery bypass graft)     Past Surgical History:  Procedure Laterality Date  . ABDOMINAL AORTIC ANEURYSM REPAIR  1999  . CARDIAC CATHETERIZATION  10/2009  . CHOLECYSTECTOMY    . CORONARY ARTERY BYPASS GRAFT N/A 04/08/2018   Procedure: CORONARY ARTERY BYPASS GRAFTING (CABG) x four, using left mammary artery and right leg greater saphenous vein harvested endoscopically.;  Surgeon: Gaye Pollack, MD;  Location: [facility] OR;  Service: Open Heart Surgery;  Laterality: N/A;  . ESOPHAGOGASTRODUODENOSCOPY (EGD) WITH PROPOFOL N/A 06/26/2016   Procedure: ESOPHAGOGASTRODUODENOSCOPY (EGD) WITH PROPOFOL;  Surgeon: Lollie Sails, MD;  Location: [facility] ENDOSCOPY;  Service: Endoscopy;  Laterality: N/A;  . LEFT HEART CATH AND CORONARY ANGIOGRAPHY N/A 03/15/2018   Procedure: LEFT HEART CATH AND CORONARY ANGIOGRAPHY;  Surgeon: Jettie Booze, MD;  Location: [place] CV LAB;  Service: Cardiovascular;  Laterality: N/A;  . PROSTATE SURGERY    . SPINE SURGERY  2009   ruptured disc  . TEE WITHOUT CARDIOVERSION N/A 04/08/2018   Procedure: TRANSESOPHAGEAL ECHOCARDIOGRAM (TEE);  Surgeon: Gaye Pollack, MD;  Location: [place];  Service: Open Heart Surgery;  Laterality: N/A;  . Visceral angiogram  05/14/2012   Right  groin area  . VISCERAL ANGIOGRAM N/A 05/14/2012   Procedure: VISCERAL ANGIOGRAM;  Surgeon: Elam Dutch, MD;  Location: [facility] CATH LAB;  Service: Cardiovascular;  Laterality: N/A;    Current Outpatient Medications  Medication Sig Dispense Refill  . aspirin EC 81 MG EC tablet Take 1 tablet (81 mg total) by mouth daily.    . famotidine (PEPCID) 20 MG tablet Take 1 tablet (20 mg total) by mouth 2 (two) times daily. 60 tablet 1  . Glucosamine-Chondroitin (COSAMIN DS PO) Take 2 tablets by mouth daily at 12 noon.    Marland Kitchen MAGNESIUM CITRATE PO Take 400 mg by mouth daily after supper.     . Melatonin 500 MCG TBDP Take 500 mcg by mouth at bedtime.    . metoprolol  tartrate (LOPRESSOR) 25 MG tablet Take 0.5 tablets (12.5 mg total) by mouth 2 (two) times daily. 60 tablet 1  . Multiple Vitamin (MULTIVITAMIN WITH MINERALS) TABS tablet Take 1 tablet by mouth daily at 12 noon. Centrum Silver    . oxyCODONE (OXY IR/ROXICODONE) 5 MG immediate release tablet Take 1 tablet (5 mg total) by mouth every 6 (six) hours as needed for severe pain. 30 tablet 0  . polyethylene glycol (MIRALAX / GLYCOLAX) packet Take 17 g by mouth daily. (Patient taking differently: Take 17 g by mouth every other day as needed for moderate constipation. ) 14 each 0  . valsartan-hydrochlorothiazide (DIOVAN HCT) 160-12.5 MG tablet Take 1 tablet by mouth daily. 30 tablet 1  . cephALEXin (KEFLEX) 500 MG capsule Take 1 capsule (500 mg total) by mouth 3 (three) times daily for 7 days. 21 capsule 0  . traMADol (ULTRAM) 50 MG tablet Take 1 tablet (50 mg total) by mouth every 6 (six) hours as needed for up to 30 doses. 30 tablet 0   No current facility-administered medications for this visit.     Allergies:   Ciprofloxacin   Social History:  The patient  reports that he quit smoking about 21 years ago. His smoking use included cigarettes. He has never used smokeless tobacco. He reports that he does not drink alcohol or use drugs.   Family History:  The patient's  family history includes COPD in his father; Cancer in his sister; Heart attack in his brother and father; Heart disease in his brother and father.    ROS:  Please see the history of present illness.  Otherwise, review of systems is positive for fatigue, skipped heartbeats, back pain, muscle pain.  All other systems are reviewed and negative.    PHYSICAL EXAM: VS:  BP (!) 130/52   Pulse (!) 58   Ht 5\' 9"  (1.753 m)   Wt 153 lb (69.4 kg)   SpO2 98%   BMI 22.59 kg/m  , BMI Body mass index is 22.59 kg/m. GEN: Well nourished, well developed, in no acute distress  HEENT: normal  Neck: no JVD, no masses. No carotid bruits Cardiac: RRR  with 2/6 systolic murmur at the LLSB               Respiratory:  clear to auscultation bilaterally, normal work of breathing Chest: erythema at superior aspect of sternotomy incision with associated tenderness GI: soft, nontender, nondistended, + BS MS: no deformity or atrophy  Ext: no pretibial edema, tenderness around right leg vein harvest sites with mild swelling, no erythema Skin: warm and dry, no rash Neuro:  Strength and sensation are intact Psych: euthymic mood, full affect  EKG:  EKG is ordered today. The ekg ordered today shows normal sinus rhythm 58 bpm, right bundle branch block, left anterior fascicular block.  No significant change from previous tracing.  Recent Labs: 04/12/2018: ALT 16 04/14/2018: BUN 13; Creatinine, Ser 1.03; Hemoglobin 8.4; Magnesium 2.0; Platelets 83; Potassium 4.4; Sodium 135   Lipid Panel  No results found for: CHOL, TRIG, HDL, CHOLHDL, VLDL, LDLCALC, LDLDIRECT    Wt Readings from Last 3 Encounters:  04/28/18 153 lb (69.4 kg)  04/26/18 154 lb (69.9 kg)  04/14/18 155 lb 4.8 oz (70.4 kg)     ASSESSMENT AND PLAN: 1.  Coronary artery disease with stable angina status post recent CABG 2.  Postoperative atrial fibrillation maintaining sinus rhythm on amiodarone 3.  Anemia, postoperative blood loss and GI bleeding 4.  Thrombocytopenia, chronic 5.  Cellulitis/sternal erythema  The patient is somewhat slow to progress after multivessel CABG.  His family is concerned because he has been extremely active prior to CABG.  I think his symptoms are not at all uncommon just 3 weeks after coronary bypass surgery considering his advanced age of 84 years old and I discussed this extensively with his family today.  We will check a CBC to make sure that he does not have any worsening of his anemia.  Will treat him empirically with Keflex 500 mg 3 times daily x1 week for potential cellulitis around his sternal incision.  We will check a chest x-ray.  Amiodarone has been  discontinued and I suspect this is contributed to his listlessness as he has had poor appetite and nausea.  He is also having issues with pain and has been taking oxycodone which may be contributing to his symptoms.  I wrote him a prescription for tramadol 50 mg as needed, #30, with no refills, so that he can minimize oxycodone use.  He is scheduled to follow-up with Dr. Cyndia Bent in 1 week.  I discussed his case with Dr. Cyndia Bent and the family is instructed to call his office if the redness around his sternotomy worsens prior to his FU appt next week.   Current medicines are reviewed with the patient today.  The patient does not have concerns regarding medicines.  Labs/ tests ordered today include:   Orders Placed This Encounter  Procedures  . DG Chest 2 View  . Basic metabolic panel  . CBC with Differential/Platelet  . EKG 12-Lead    Disposition:   FU as planned (appt with Dr Cyndia Bent next week)  Signed, Sherren Mocha, MD  04/28/2018 6:01 PM    [facility] [place] [place], [place], [place]  [address] Phone: [phone]; Fax: [phone]

## 2018-04-29 ENCOUNTER — Ambulatory Visit: Payer: Medicare Other | Admitting: Physician Assistant

## 2018-04-29 ENCOUNTER — Ambulatory Visit
Admission: RE | Admit: 2018-04-29 | Discharge: 2018-04-29 | Disposition: A | Discharge status: Home / Self Care | Payer: Medicare Other | Source: Ambulatory Visit | Attending: Cardiovascular Disease | Admitting: Cardiovascular Disease

## 2018-04-29 DIAGNOSIS — R0602 Shortness of breath: Secondary | ICD-10-CM

## 2018-04-29 LAB — BASIC METABOLIC PANEL
BUN/Creatinine Ratio: 14 (ref 10–24)
BUN: 14 mg/dL (ref 8–27)
CO2: 23 mmol/L (ref 20–29)
Calcium: 9.1 mg/dL (ref 8.6–10.2)
Chloride: 98 mmol/L (ref 96–106)
Creatinine, Ser: 0.99 mg/dL (ref 0.76–1.27)
GFR calc Af Amer: 78 mL/min/{1.73_m2} (ref >59)
GFR calc non Af Amer: 67 mL/min/{1.73_m2} (ref >59)
Glucose: 102 mg/dL — ABNORMAL HIGH (ref 65–99)
Potassium: 4.1 mmol/L (ref 3.5–5.2)
Sodium: 136 mmol/L (ref 134–144)

## 2018-04-29 LAB — CBC WITH DIFFERENTIAL/PLATELET
Basophils Absolute: 0 10*3/uL (ref 0.0–0.2)
Basos: 0 %
EOS (ABSOLUTE): 0.1 10*3/uL (ref 0.0–0.4)
Eos: 1 %
Hematocrit: 30.1 % — ABNORMAL LOW (ref 37.5–51.0)
Hemoglobin: 9.8 g/dL — ABNORMAL LOW (ref 13.0–17.7)
Immature Grans (Abs): 0 10*3/uL (ref 0.0–0.1)
Immature Granulocytes: 0 %
Lymphocytes Absolute: 1.7 10*3/uL (ref 0.7–3.1)
Lymphs: 40 %
MCH: 27.2 pg (ref 26.6–33.0)
MCHC: 32.6 g/dL (ref 31.5–35.7)
MCV: 84 fL (ref 79–97)
Monocytes Absolute: 0.5 10*3/uL (ref 0.1–0.9)
Monocytes: 12 %
Neutrophils Absolute: 1.9 10*3/uL (ref 1.4–7.0)
Neutrophils: 47 %
Platelets: 154 10*3/uL (ref 150–379)
RBC: 3.6 x10E6/uL — ABNORMAL LOW (ref 4.14–5.80)
RDW: 14.6 % (ref 12.3–15.4)
WBC: 4.2 10*3/uL (ref 3.4–10.8)

## 2018-05-04 ENCOUNTER — Other Ambulatory Visit: Payer: Self-pay | Admitting: Surgery

## 2018-05-04 ENCOUNTER — Other Ambulatory Visit: Payer: Self-pay | Admitting: Cardiothoracic Surgery

## 2018-05-04 ENCOUNTER — Encounter: Payer: Self-pay | Admitting: Surgery

## 2018-05-04 DIAGNOSIS — Z951 Presence of aortocoronary bypass graft: Secondary | ICD-10-CM

## 2018-05-05 ENCOUNTER — Ambulatory Visit (INDEPENDENT_AMBULATORY_CARE_PROVIDER_SITE_OTHER): Payer: Self-pay | Admitting: Surgery

## 2018-05-05 ENCOUNTER — Encounter: Payer: Self-pay | Admitting: Surgery

## 2018-05-05 ENCOUNTER — Other Ambulatory Visit: Payer: Self-pay

## 2018-05-05 ENCOUNTER — Ambulatory Visit
Admission: RE | Admit: 2018-05-05 | Discharge: 2018-05-05 | Disposition: A | Discharge status: Home / Self Care | Payer: Medicare Other | Source: Ambulatory Visit | Attending: Surgery | Admitting: Surgery

## 2018-05-05 VITALS — BP 128/78 | HR 52 | Resp 28 | Ht 69.0 in | Wt 155.0 lb

## 2018-05-05 DIAGNOSIS — Z951 Presence of aortocoronary bypass graft: Secondary | ICD-10-CM

## 2018-05-05 NOTE — Progress Notes (Signed)
HPI:  Patient returns for routine postoperative follow-up having undergone coronary artery bypass graft surgery x4 on 04/08/2018. The patient's early postoperative recovery while in the hospital was notable for development of postoperative atrial fibrillation that was converted with amiodarone.. Since hospital discharge the patient reports that he was making fairly good progress and was walking on his treadmill when he fell off and sprained his right ankle.  He was seen in the office by my [place] on 04/26/2018.  He was complaining of poor appetite and generalized fatigue.  His amiodarone was discontinued and Crestor was also stopped.  He said that his appetite has improved since being off amiodarone.  He saw Dr. Burt Knack on 04/28/2018 and was felt to be doing relatively well for 3 weeks postoperatively.  He did have some redness at the top of his chest incision and was started on Keflex 500 mg 3 times a day for a week for possible cellulitis.  He and his 2 sons report that he continues to have some fatigue and tiredness.  He has mild incisional discomfort.  His main complaint appears to be of pain at the lower end of his right leg vein harvest incision where there is a hematoma or seroma in the vein harvest tunnel.  He has mild shortness of breath with activity.  He has no anginal chest discomfort.   Current Outpatient Medications  Medication Sig Dispense Refill  . aspirin EC 81 MG EC tablet Take 1 tablet (81 mg total) by mouth daily.    . cephALEXin (KEFLEX) 500 MG capsule Take 1 capsule (500 mg total) by mouth 3 (three) times daily for 7 days. 21 capsule 0  . famotidine (PEPCID) 20 MG tablet Take 1 tablet (20 mg total) by mouth 2 (two) times daily. 60 tablet 1  . Glucosamine-Chondroitin (COSAMIN DS PO) Take 2 tablets by mouth daily at 12 noon.    Marland Kitchen MAGNESIUM CITRATE PO Take 400 mg by mouth daily after supper.     . Melatonin 500 MCG TBDP Take 500 mcg by mouth at bedtime.    . metoprolol  tartrate (LOPRESSOR) 25 MG tablet Take 0.5 tablets (12.5 mg total) by mouth 2 (two) times daily. 60 tablet 1  . Multiple Vitamin (MULTIVITAMIN WITH MINERALS) TABS tablet Take 1 tablet by mouth daily at 12 noon. Centrum Silver    . polyethylene glycol (MIRALAX / GLYCOLAX) packet Take 17 g by mouth daily. (Patient taking differently: Take 17 g by mouth every other day as needed for moderate constipation. ) 14 each 0  . traMADol (ULTRAM) 50 MG tablet Take 1 tablet (50 mg total) by mouth every 6 (six) hours as needed for up to 30 doses. 30 tablet 0  . valsartan-hydrochlorothiazide (DIOVAN HCT) 160-12.5 MG tablet Take 1 tablet by mouth daily. 30 tablet 1   No current facility-administered medications for this visit.     Physical Exam: BP 128/78 (BP Location: Left Arm, Patient Position: Sitting, Cuff Size: Normal)   Pulse (!) 52   Resp (!) 28   Ht 5\' 9"  (1.753 m)   Wt 155 lb (70.3 kg)   SpO2 99% Comment: RA  BMI 22.89 kg/m  He looks tired but is alert and conversant. Cardiac exam shows a regular rate and rhythm with normal heart sounds. Lungs are clear. The chest incision is healing well and sternum is stable.  There is minimal redness around the suture knot at the top of his chest incision. The right  leg incisions are intact.  There is no sign of infection.  There is a small hematoma or seroma in the lower end of the vein harvest tunnel between the incision and the stab and grab.  There is no erythema but is slightly tender to palpation.  I do not think there is any infection.  There is no significant edema in either lower extremity.  Diagnostic Tests:  CLINICAL DATA:  Status post coronary bypass grafting, chest pain and shortness of breath  EXAM: CHEST - 2 VIEW  COMPARISON:  04/29/2018  FINDINGS: Cardiac shadow is stable. Aortic calcifications are again seen. Postsurgical changes are noted. The lungs are well aerated bilaterally with stable blunting of the right costophrenic  angle. No new focal infiltrate is seen. No bony abnormality is noted.  IMPRESSION: Stable blunting of the right costophrenic angle. No new focal abnormality is noted.   Electronically Signed   By: Inez Catalina M.D.   On: 05/05/2018 11:56  Impression:  Overall he is making slow but steady progress after coronary bypass graft surgery which is not unexpected at his age of 84.  His heart rate is 52 off of amiodarone and asked him to discontinue the Lopressor at this time since that may be decreasing his heart rate and causing fatigue.  He had labs checked on 04/28/2018 when seen by Dr. Burt Knack.  His electrolytes were normal with a BUN of 14 and a creatinine of 0.9.  His white blood cell count was 4.2 and his hemoglobin had increased slightly to 9.8 from his prior 8.4 on 04/14/2018.  Platelet count increased to 154,000 which was surprising since his platelet count usually is in the 50-60,000 range chronically.  I encouraged him to get outside and ambulate and gradually work up to 20 to 30 minutes at a time at least twice per day.  I think a treadmill is somewhat dangerous in an 84 year old patient after surgery.  I would expect that his stamina should gradually improve with ambulation.  I do not see any signs of infection.  I reviewed the signs and symptoms of infection with the patient and his family so they can continue to monitor the right leg hematoma/seroma.  He has already returned to driving a car with his sons.I will plan to see him back in 3 weeks for reexamination.  Plan:  He will follow-up with me in 3 weeks.  He and his family know to call me if he develops any fever, chills, increased redness or tenderness around the right leg hematoma/seroma.   Gaye Pollack, MD [facility] [phone]

## 2018-05-06 ENCOUNTER — Other Ambulatory Visit: Payer: Self-pay | Admitting: Physician Assistant

## 2018-05-11 ENCOUNTER — Other Ambulatory Visit: Payer: Self-pay

## 2018-05-11 MED ORDER — AMLODIPINE BESYLATE 10 MG PO TABS: 5.0000 mg | ORAL_TABLET | Freq: Every day | ORAL | 1 refills | Status: AC

## 2018-05-14 ENCOUNTER — Telehealth: Payer: Self-pay | Admitting: Cardiovascular Disease

## 2018-05-14 ENCOUNTER — Other Ambulatory Visit: Payer: Self-pay | Admitting: Cardiovascular Disease

## 2018-05-14 ENCOUNTER — Other Ambulatory Visit: Payer: Self-pay

## 2018-05-14 DIAGNOSIS — G8918 Other acute postprocedural pain: Secondary | ICD-10-CM

## 2018-05-14 MED ORDER — TRAMADOL HCL 50 MG PO TABS: 50.0000 mg | ORAL_TABLET | Freq: Four times a day (QID) | ORAL | 0 refills | Status: DC | PRN

## 2018-05-14 NOTE — Telephone Encounter (Signed)
Pt's pharmacy is requesting a refill on tramadol. Would Dr. Burt Knack like to refill this medication? Please address

## 2018-05-14 NOTE — Telephone Encounter (Signed)
Pt's son calling requesting a refill on Tramadol, stating pt is completely out of medication and I have already routed request to Harland German, RN, out of office, please address.

## 2018-05-14 NOTE — Telephone Encounter (Signed)
Called pt's son to inform him per Caren Hazy, RN, for pt to contact Dr. Vivi Martens office for further refills of Tramadol and if pt has any other problems, questions or concerns pertaining to cardiology, to give our office a call back. Pt's son verbalized understanding.

## 2018-05-14 NOTE — Telephone Encounter (Signed)
New Message     *STAT* If patient is at the pharmacy, call can be transferred to refill team.   1. Which medications need to be refilled? (please list name of each medication and dose if known) traMADol (ULTRAM) 50 MG tablet  2. Which pharmacy/location (including street and city if local pharmacy) is medication to be sent to? [place]  3. Do they need a 30 day or 90 day supply? 90 day

## 2018-05-14 NOTE — Telephone Encounter (Signed)
This is Dr. Hassell Done patient. Patient should contact Dr. Vivi Martens office for further refills of Tramadol.

## 2018-05-26 ENCOUNTER — Encounter: Payer: Self-pay | Admitting: Surgery

## 2018-05-26 ENCOUNTER — Ambulatory Visit (INDEPENDENT_AMBULATORY_CARE_PROVIDER_SITE_OTHER): Payer: Self-pay | Admitting: Surgery

## 2018-05-26 VITALS — BP 134/77 | HR 74 | Resp 20 | Ht 69.0 in | Wt 149.0 lb

## 2018-05-26 DIAGNOSIS — I2510 Atherosclerotic heart disease of native coronary artery without angina pectoris: Secondary | ICD-10-CM

## 2018-05-26 DIAGNOSIS — Z951 Presence of aortocoronary bypass graft: Secondary | ICD-10-CM

## 2018-05-26 NOTE — Progress Notes (Signed)
HPI:  The patient returns for follow-up status post coronary bypass graft surgery x4 on 04/08/2018.  He had postoperative atrial fibrillation that was converted with amiodarone.  He was seen back in the office on 04/27/1999 19 x 1 of my physician assistants and was progressing slowly and complaining of poor appetite and generalized fatigue.  His amiodarone and Crestor were discontinued.  He said that his appetite has continued to improve as has his stamina.  He was also treated with a one-week course of Keflex due to some redness at the top of his chest incision which has resolved.  When I saw him in the office on 05/05/2018 his main complaint was of a hematoma or seroma in the vein harvest tunnel at the lower end of his right leg vein harvest.  I have reassured him that this would gradually improve.  Since I last saw him he said that he has continued to walk and feels much better.  His appetite is returned.  He denies any chest pain or shortness of breath.  Current Outpatient Medications  Medication Sig Dispense Refill  . amLODipine (NORVASC) 10 MG tablet Take 0.5 tablets (5 mg total) by mouth daily. 60 tablet 1  . aspirin EC 81 MG EC tablet Take 1 tablet (81 mg total) by mouth daily.    . Glucosamine-Chondroitin (COSAMIN DS PO) Take 2 tablets by mouth daily at 12 noon.    Marland Kitchen MAGNESIUM CITRATE PO Take 400 mg by mouth daily after supper.     . Melatonin 500 MCG TBDP Take 500 mcg by mouth at bedtime.    . Multiple Vitamin (MULTIVITAMIN WITH MINERALS) TABS tablet Take 1 tablet by mouth daily at 12 noon. Centrum Silver    . oxyCODONE-acetaminophen (PERCOCET) 7.5-325 MG tablet Take 1 tablet by mouth every 4 (four) hours as needed for severe pain (RX for back pain Dr Dyke Maes).    . polyethylene glycol (MIRALAX / GLYCOLAX) packet Take 17 g by mouth daily. (Patient taking differently: Take 17 g by mouth every other day as needed for moderate constipation. ) 14 each 0  . valsartan-hydrochlorothiazide  (DIOVAN HCT) 160-12.5 MG tablet Take 1 tablet by mouth daily. 30 tablet 1   No current facility-administered medications for this visit.      Physical Exam: BP 134/77   Pulse 74   Resp 20   Ht 5\' 9"  (1.753 m)   Wt 149 lb (67.6 kg)   SpO2 98% Comment: RA  BMI 22.00 kg/m  He looks well. Cardiac exam shows regular rate and rhythm with normal heart sounds. Lung exam is clear. Chest incision is healing well and sternum is stable. The right leg incisions are healing well.  The hematoma or seroma at the lower end of his vein harvest tunnel appears to be getting smaller.  It is firm but nontender and there is no erythema.  There is mild edema in the right ankle which she feels is related to his recent ankle sprain.  There is no edema in the left ankle.  Diagnostic Tests:  None today  Impression:  Overall I think [name] is now making good progress in his recovery.  I encouraged him to continue walking as much as possible.  I told him he can return to driving but should not lift anything heavier than 10 pounds for a total of 3 months from date of surgery.  I think he would benefit from participating in cardiac rehab.  I reviewed everything with  the patient's son who is here today with him as well as the patient's wife.  Plan:  He will continue to follow-up with his PCP as well as Dr. Irish Lack for his cardiology care.  He will return to see me if he has any further problems with his incisions.   Gaye Pollack, MD [facility] [phone]

## 2018-06-06 ENCOUNTER — Other Ambulatory Visit: Payer: Self-pay | Admitting: Physician Assistant

## 2018-06-07 ENCOUNTER — Other Ambulatory Visit: Payer: Self-pay | Admitting: Physician Assistant

## 2018-06-07 IMAGING — DX DG CHEST 2V
2 series · 2 of 2 positions shown · non-contrast
Comparison: 04/11/2018

CLINICAL DATA: Dyspnea, history of CABG 04/08/2018

EXAM:
CHEST - 2 VIEW

[dg chest 2 view (1 of 2)]
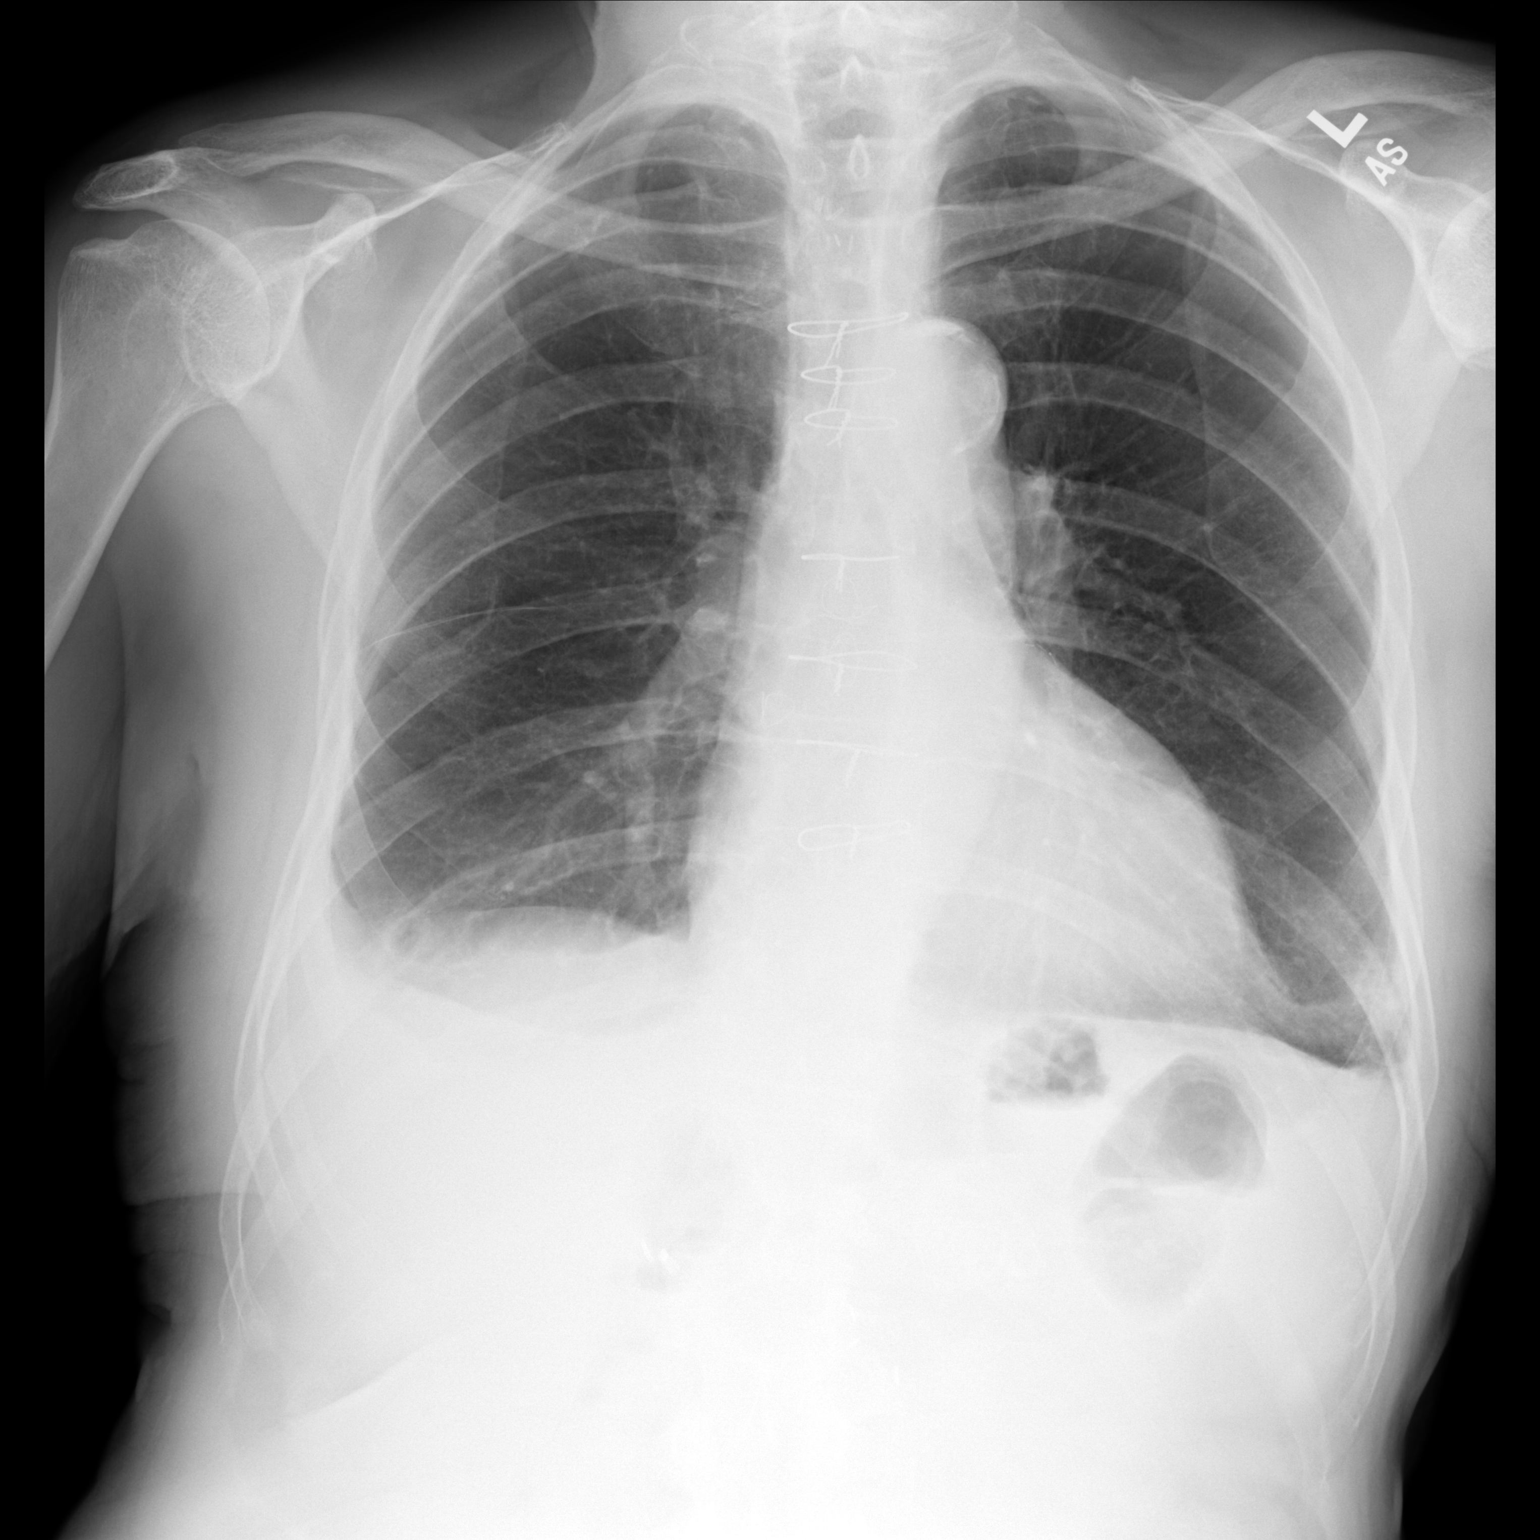

[dg chest 2 view (2 of 2)]
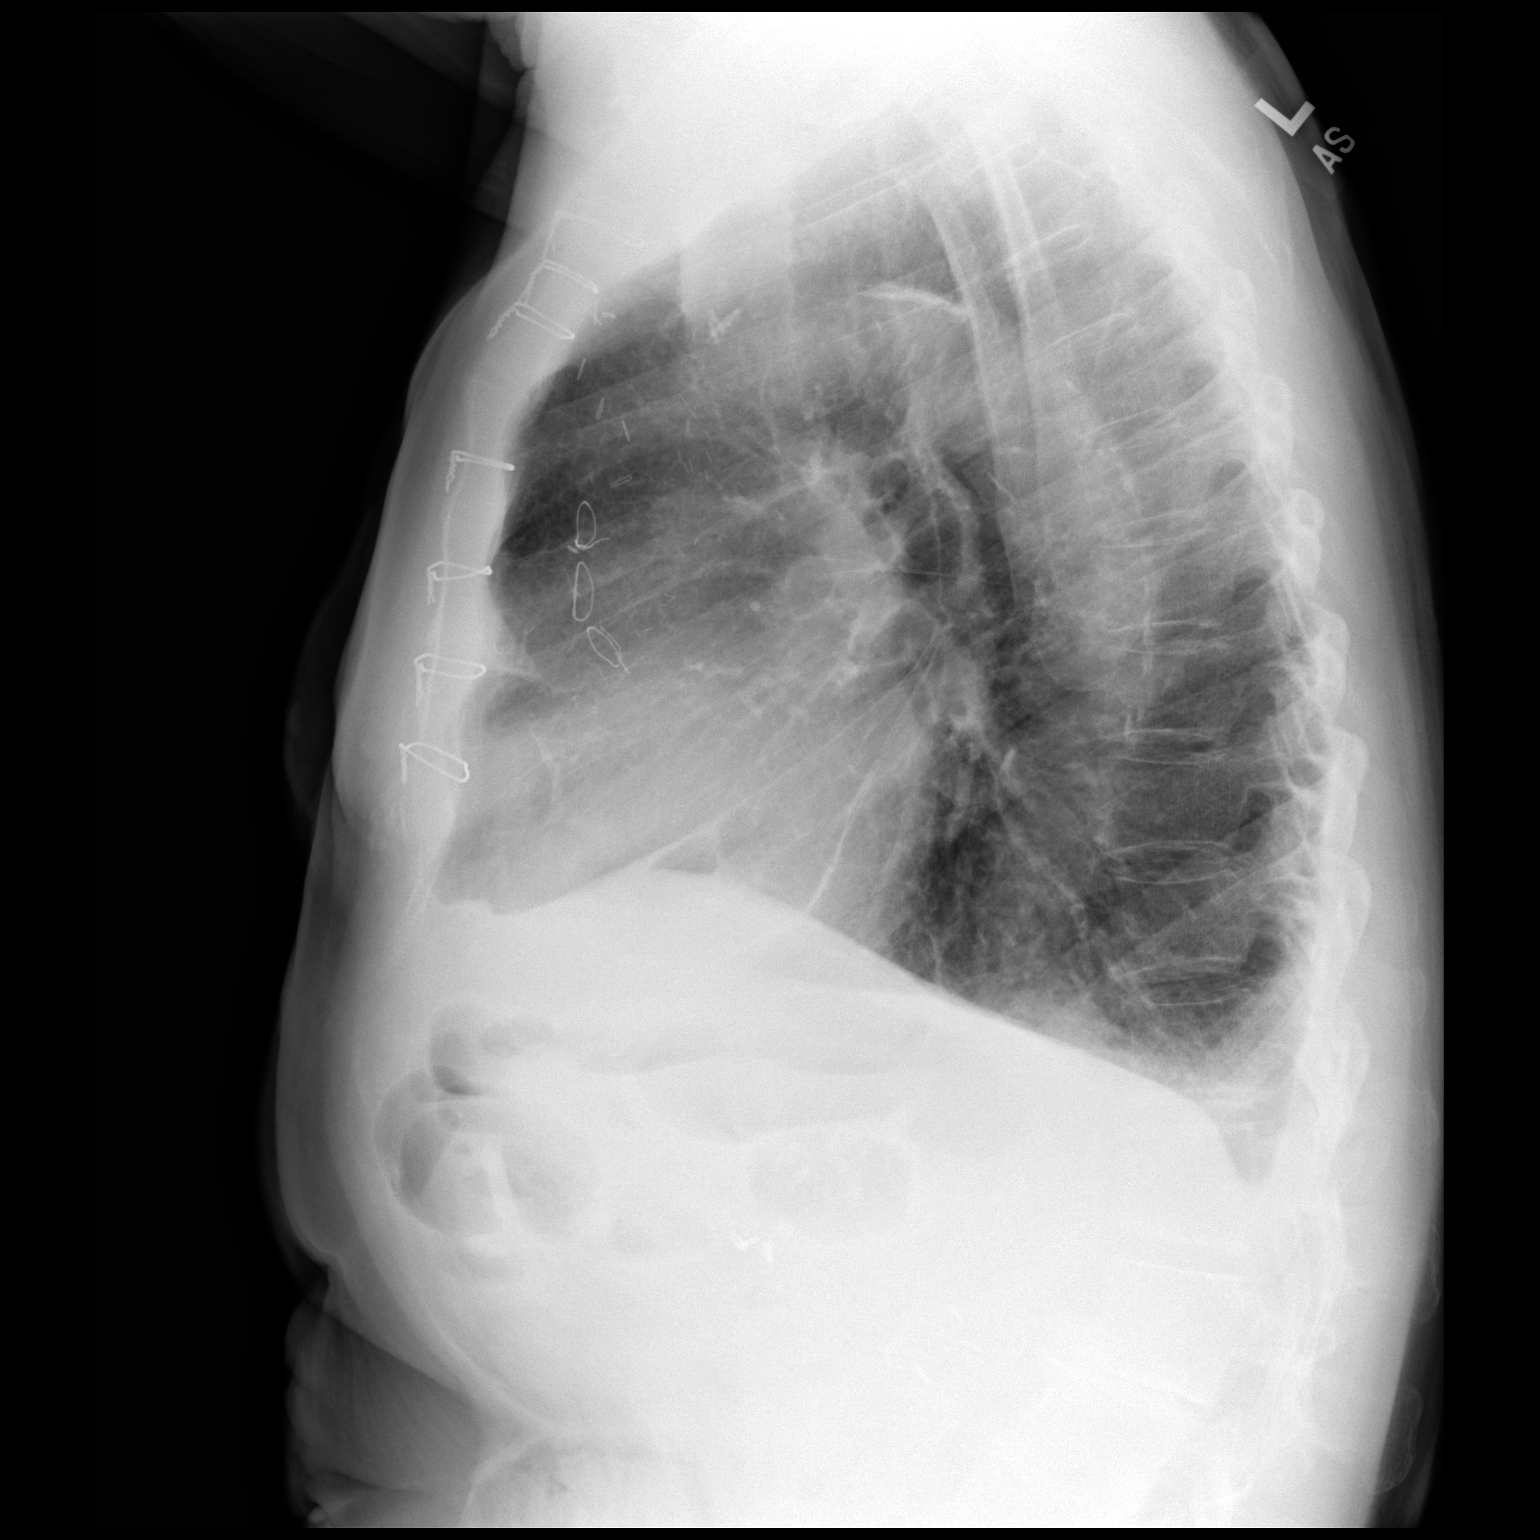

[2 of 2 positions shown; findings below may reference images not displayed]

FINDINGS: Heart size is within normal limits. There is moderate aortic
atherosclerosis at the arch with slight uncoiling of the thoracic
aorta. Median sternotomy sutures are in place. Small bilateral
pleural effusions right greater than left with bibasilar adjacent
atelectasis. Small focus of pneumonia especially along the periphery
of the left lung base is not excluded. Findings are more likely
related to atelectasis however.
IMPRESSION: Small bilateral pleural effusions with minimal airspace opacity at
the left lung base near the costophrenic angle, likely to represent
atelectasis. Minimal pneumonia is not entirely excluded but believed
less likely.

## 2018-06-27 NOTE — Progress Notes (Signed)
[facility]  Telephone:([phone]) [phone] Fax:(336) [phone]  ID: [name] OB: 08-08-1926  MR#: [id number]  DQQ#:[id number]  Patient Care Team: [name], MD as PCP - General (Family Medicine) Jettie Booze, MD as PCP - Cardiology (Cardiology) Lollie Sails, MD as Consulting Physician (Gastroenterology)  CHIEF COMPLAINT: MDS, thrombocytopenia  INTERVAL HISTORY: Patient returns to clinic today for repeat laboratory and further evaluation.  He underwent cardiac bypass surgery in April which required a platelet transfusion to improve his platelet count.  He tolerated the procedure well without significant problems or side effects.  He currently feels well and is asymptomatic. He has no neurologic complaints.  He denies any easy bleeding or bruising.  He has a good appetite and denies weight loss.  He has no chest pain, shortness of breath, or cough.  He has no nausea, vomiting, constipation, or diarrhea.  He has no urinary complaints.  Patient offers no specific complaints today.  REVIEW OF SYSTEMS:   Review of Systems  Constitutional: Negative.  Negative for fever, malaise/fatigue and weight loss.  Respiratory: Negative.  Negative for cough and shortness of breath.   Cardiovascular: Negative.  Negative for chest pain and leg swelling.  Gastrointestinal: Negative for abdominal pain, blood in stool, melena, nausea and vomiting.  Genitourinary: Negative.  Negative for hematuria.  Musculoskeletal: Negative.  Negative for back pain.  Skin: Negative.  Negative for rash.  Neurological: Negative.  Negative for sensory change, focal weakness and weakness.  Endo/Heme/Allergies: Does not bruise/bleed easily.  Psychiatric/Behavioral: Negative.  The patient is not nervous/anxious and does not have insomnia.     As per HPI. Otherwise, a complete review of systems is negative.  PAST MEDICAL HISTORY: Past Medical History:  Diagnosis Date  . AAA (abdominal  aortic aneurysm) ([place]) 1999   a. treated with open repair in 1999 with subsequent distal anastomotic pseudoaneurysm that is being followed by Dr. Oneida Alar.  . Adenomatous colon polyp   . Aorta aneurysm ([place])   . Arthritis   . Basal cell cancer Dec. 24, 2013   Nose  . CAD (coronary artery disease)    a. s/p CABG 03/2018.  Marland Kitchen Cancer [facility]) Aug. 2013   prostate Seeding  implant  . Carotid artery occlusion   . Enlarged prostate 2008   Thermotherapy  . Gastroesophageal reflux disease   . GI bleed   . Hyperlipidemia   . Hypertension   . Mild aortic stenosis   . Myelodysplasia (myelodysplastic syndrome) ([place])   . Pancytopenia ([place])    sees Dr. Grayland Ormond  . Peripheral vascular disease ([place])   . Postoperative atrial fibrillation ([place])   . Psoriasis   . S/P CABG (coronary artery bypass graft)     PAST SURGICAL HISTORY: Past Surgical History:  Procedure Laterality Date  . ABDOMINAL AORTIC ANEURYSM REPAIR  1999  . CARDIAC CATHETERIZATION  10/2009  . CHOLECYSTECTOMY    . CORONARY ARTERY BYPASS GRAFT N/A 04/08/2018   Procedure: CORONARY ARTERY BYPASS GRAFTING (CABG) x four, using left mammary artery and right leg greater saphenous vein harvested endoscopically.;  Surgeon: Gaye Pollack, MD;  Location: [facility] OR;  Service: Open Heart Surgery;  Laterality: N/A;  . ESOPHAGOGASTRODUODENOSCOPY (EGD) WITH PROPOFOL N/A 06/26/2016   Procedure: ESOPHAGOGASTRODUODENOSCOPY (EGD) WITH PROPOFOL;  Surgeon: Lollie Sails, MD;  Location: [facility] ENDOSCOPY;  Service: Endoscopy;  Laterality: N/A;  . LEFT HEART CATH AND CORONARY ANGIOGRAPHY N/A 03/15/2018   Procedure: LEFT HEART CATH AND CORONARY ANGIOGRAPHY;  Surgeon: Jettie Booze, MD;  Location: [facility] LAB;  Service: Cardiovascular;  Laterality: N/A;  . PROSTATE SURGERY    . SPINE SURGERY  2009   ruptured disc  . TEE WITHOUT CARDIOVERSION N/A 04/08/2018   Procedure: TRANSESOPHAGEAL ECHOCARDIOGRAM (TEE);  Surgeon: Gaye Pollack, MD;  Location: [facility];  Service: Open Heart Surgery;  Laterality: N/A;  . Visceral angiogram  05/14/2012   Right  groin area  . VISCERAL ANGIOGRAM N/A 05/14/2012   Procedure: VISCERAL ANGIOGRAM;  Surgeon: Elam Dutch, MD;  Location: [facility] CATH LAB;  Service: Cardiovascular;  Laterality: N/A;    FAMILY HISTORY Family History  Problem Relation Age of Onset  . Heart attack Father   . Heart disease Father        After age 26  . COPD Father   . Cancer Sister        Breast and Brain  . Heart disease Brother        before age 69  . Heart attack Brother        ADVANCED DIRECTIVES:    HEALTH MAINTENANCE: Social History   Tobacco Use  . Smoking status: Former Smoker    Types: Cigarettes    Last attempt to quit: 04/29/1997    Years since quitting: 21.1  . Smokeless tobacco: Never Used  Substance Use Topics  . Alcohol use: No  . Drug use: No     Allergies  Allergen Reactions  . Ciprofloxacin Other (See Comments)    Abdominal Pain     Current Outpatient Medications  Medication Sig Dispense Refill  . amLODipine (NORVASC) 10 MG tablet Take 0.5 tablets (5 mg total) by mouth daily. 60 tablet 1  . Glucosamine-Chondroitin (COSAMIN DS PO) Take 2 tablets by mouth daily at 12 noon.    Marland Kitchen MAGNESIUM CITRATE PO Take 400 mg by mouth daily after supper.     . Melatonin 500 MCG TBDP Take 500 mcg by mouth at bedtime.    . Multiple Vitamin (MULTIVITAMIN WITH MINERALS) TABS tablet Take 1 tablet by mouth daily at 12 noon. Centrum Silver    . oxyCODONE-acetaminophen (PERCOCET) 7.5-325 MG tablet Take 1 tablet by mouth every 4 (four) hours as needed for severe pain (RX for back pain Dr Dyke Maes).    . valsartan-hydrochlorothiazide (DIOVAN HCT) 160-12.5 MG tablet Take 1 tablet by mouth daily. 30 tablet 1  . polyethylene glycol (MIRALAX / GLYCOLAX) packet Take 17 g by mouth daily. (Patient not taking: Reported on 06/29/2018) 14 each 0   No current facility-administered medications for this visit.      OBJECTIVE: Vitals:   06/29/18 1514  BP: (!) 147/75  Pulse: 65  Resp: 18  Temp: (!) 95.4 F (35.2 C)     Body mass index is 22.59 kg/m.    ECOG FS:0 - Asymptomatic  General: Well-developed, well-nourished, no acute distress. Eyes: Pink conjunctiva, anicteric sclera. HEENT: Normocephalic, moist mucous membranes, clear oropharnyx. Lungs: Clear to auscultation bilaterally. Heart: Regular rate and rhythm. No rubs, murmurs, or gallops. Abdomen: Soft, nontender, nondistended. No organomegaly noted, normoactive bowel sounds. Musculoskeletal: No edema, cyanosis, or clubbing. Neuro: Alert, answering all questions appropriately. Cranial nerves grossly intact. Skin: No rashes or petechiae noted. Psych: Normal affect.   LAB RESULTS:  Lab Results  Component Value Date   NA 136 04/28/2018   K 4.1 04/28/2018   CL 98 04/28/2018   CO2 23 04/28/2018   GLUCOSE 102 (H) 04/28/2018   BUN 14 04/28/2018   CREATININE 0.99 04/28/2018   CALCIUM  9.1 04/28/2018   PROT 5.0 (L) 04/12/2018   ALBUMIN 3.0 (L) 04/12/2018   AST 29 04/12/2018   ALT 16 (L) 04/12/2018   ALKPHOS 41 04/12/2018   BILITOT 1.3 (H) 04/12/2018   GFRNONAA 67 04/28/2018   GFRAA 78 04/28/2018    Lab Results  Component Value Date   WBC 2.8 (L) 06/29/2018   NEUTROABS 1.4 06/29/2018   HGB 10.8 (L) 06/29/2018   HCT 32.7 (L) 06/29/2018   MCV 82.2 06/29/2018   PLT 40 (L) 06/29/2018     STUDIES: No results found.  ASSESSMENT: MDS, thrombocytopenia.  PLAN:    1. MDS: Confirmed by bone marrow biopsy. 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  Given his absolute increase in monocytes the possibility of an early/evolving CMML cannot be excluded. Cytogenics are normal 46XY. No increase in blast count.  Return to clinic in 3 months with repeat laboratory work and then in 6 months with repeat laboratory work and further evaluation.    2. 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 Patient's platelets responded appropriately with transfusion at the time of his cardiac bypass and now have trended back down to approximately his baseline of 40.  No intervention is needed at this time.  Previously prednisone worked temporarily, but patient had significant side effects with treatment.  Return to clinic as above. 3.  Prostate cancer: Patient is status post seed implantation.  His last PSA on March 19, 2015 was reported as 0.03.  4.  Cardiac disease: Continue follow-up and treatment per cardiac surgery. 5.  History of GI bleed: Continue monitoring by GI.  Patient has discontinued Aciphex. 6.  Leukopenia: Mild, likely secondary to underlying MDS.  Monitor. 7.  Anemia: Patient's hemoglobin has trended up since his cardiac bypass surgery   Patient expressed understanding and was in agreement with this plan. He also understands that He can call clinic at any time with any questions, concerns, or complaints.   Lloyd Huger, MD   07/02/2018 1:15 PM

## 2018-06-28 ENCOUNTER — Other Ambulatory Visit: Payer: Self-pay | Admitting: *Deleted

## 2018-06-28 DIAGNOSIS — D469 Myelodysplastic syndrome, unspecified: Secondary | ICD-10-CM

## 2018-06-28 NOTE — Progress Notes (Signed)
cbc

## 2018-06-29 ENCOUNTER — Other Ambulatory Visit: Payer: Self-pay

## 2018-06-29 ENCOUNTER — Inpatient Hospital Stay (HOSPITAL_BASED_OUTPATIENT_CLINIC_OR_DEPARTMENT_OTHER): Payer: Medicare Other | Admitting: Oncology

## 2018-06-29 ENCOUNTER — Inpatient Hospital Stay: Payer: Medicare Other | Attending: Oncology

## 2018-06-29 VITALS — BP 147/75 | HR 65 | Temp 95.4°F | Resp 18 | Wt 153.0 lb

## 2018-06-29 DIAGNOSIS — Z85828 Personal history of other malignant neoplasm of skin: Secondary | ICD-10-CM

## 2018-06-29 DIAGNOSIS — I739 Peripheral vascular disease, unspecified: Secondary | ICD-10-CM | POA: Diagnosis not present

## 2018-06-29 DIAGNOSIS — L409 Psoriasis, unspecified: Secondary | ICD-10-CM

## 2018-06-29 DIAGNOSIS — I2510 Atherosclerotic heart disease of native coronary artery without angina pectoris: Secondary | ICD-10-CM

## 2018-06-29 DIAGNOSIS — K219 Gastro-esophageal reflux disease without esophagitis: Secondary | ICD-10-CM

## 2018-06-29 DIAGNOSIS — Z951 Presence of aortocoronary bypass graft: Secondary | ICD-10-CM

## 2018-06-29 DIAGNOSIS — I10 Essential (primary) hypertension: Secondary | ICD-10-CM

## 2018-06-29 DIAGNOSIS — N400 Enlarged prostate without lower urinary tract symptoms: Secondary | ICD-10-CM | POA: Diagnosis not present

## 2018-06-29 DIAGNOSIS — E785 Hyperlipidemia, unspecified: Secondary | ICD-10-CM

## 2018-06-29 DIAGNOSIS — Z8546 Personal history of malignant neoplasm of prostate: Secondary | ICD-10-CM | POA: Diagnosis not present

## 2018-06-29 DIAGNOSIS — Z79899 Other long term (current) drug therapy: Secondary | ICD-10-CM | POA: Diagnosis not present

## 2018-06-29 DIAGNOSIS — D469 Myelodysplastic syndrome, unspecified: Secondary | ICD-10-CM

## 2018-06-29 DIAGNOSIS — Z8719 Personal history of other diseases of the digestive system: Secondary | ICD-10-CM | POA: Diagnosis not present

## 2018-06-29 DIAGNOSIS — D61818 Other pancytopenia: Secondary | ICD-10-CM | POA: Diagnosis not present

## 2018-06-29 DIAGNOSIS — Z87891 Personal history of nicotine dependence: Secondary | ICD-10-CM

## 2018-06-29 DIAGNOSIS — D696 Thrombocytopenia, unspecified: Secondary | ICD-10-CM

## 2018-06-29 LAB — CBC WITH DIFFERENTIAL/PLATELET
Basophils Absolute: 0 10*3/uL (ref 0–0.1)
Basophils Relative: 1 %
Eosinophils Absolute: 0 10*3/uL (ref 0–0.7)
Eosinophils Relative: 1 %
HCT: 32.7 % — ABNORMAL LOW (ref 40.0–52.0)
Hemoglobin: 10.8 g/dL — ABNORMAL LOW (ref 13.0–18.0)
Lymphocytes Relative: 33 %
Lymphs Abs: 0.9 10*3/uL — ABNORMAL LOW (ref 1.0–3.6)
MCH: 27.2 pg (ref 26.0–34.0)
MCHC: 33.1 g/dL (ref 32.0–36.0)
MCV: 82.2 fL (ref 80.0–100.0)
Monocytes Absolute: 0.5 10*3/uL (ref 0.2–1.0)
Monocytes Relative: 17 %
Neutro Abs: 1.4 10*3/uL (ref 1.4–6.5)
Neutrophils Relative %: 48 %
Platelets: 40 10*3/uL — ABNORMAL LOW (ref 150–400)
RBC: 3.98 MIL/uL — ABNORMAL LOW (ref 4.40–5.90)
RDW: 17.7 % — ABNORMAL HIGH (ref 11.5–14.5)
WBC: 2.8 10*3/uL — ABNORMAL LOW (ref 3.8–10.6)

## 2018-06-29 NOTE — Progress Notes (Signed)
Here for follow up. Per pt he had" open heart surgery 04/08/18 " feeling much better he stated.

## 2018-07-09 ENCOUNTER — Other Ambulatory Visit: Payer: Self-pay | Admitting: Physician Assistant

## 2018-07-29 ENCOUNTER — Ambulatory Visit: Payer: Medicare Other | Admitting: Family

## 2018-07-29 ENCOUNTER — Other Ambulatory Visit (HOSPITAL_COMMUNITY): Payer: Medicare Other

## 2018-07-29 ENCOUNTER — Encounter (HOSPITAL_COMMUNITY): Payer: Medicare Other

## 2018-08-16 ENCOUNTER — Other Ambulatory Visit
Admission: RE | Admit: 2018-08-16 | Discharge: 2018-08-16 | Disposition: A | Discharge status: Home / Self Care | Payer: Medicare Other | Source: Ambulatory Visit | Attending: Geriatric Medicine | Admitting: Geriatric Medicine

## 2018-08-16 DIAGNOSIS — I2510 Atherosclerotic heart disease of native coronary artery without angina pectoris: Secondary | ICD-10-CM | POA: Insufficient documentation

## 2018-08-16 DIAGNOSIS — D469 Myelodysplastic syndrome, unspecified: Secondary | ICD-10-CM | POA: Insufficient documentation

## 2018-08-16 DIAGNOSIS — R7303 Prediabetes: Secondary | ICD-10-CM | POA: Insufficient documentation

## 2018-08-16 LAB — CBC WITH DIFFERENTIAL/PLATELET
Basophils Absolute: 0 10*3/uL (ref 0–0.1)
Basophils Relative: 0 %
Eosinophils Absolute: 0 10*3/uL (ref 0–0.7)
Eosinophils Relative: 1 %
HCT: 33.5 % — ABNORMAL LOW (ref 40.0–52.0)
Hemoglobin: 11.5 g/dL — ABNORMAL LOW (ref 13.0–18.0)
Lymphocytes Relative: 31 %
Lymphs Abs: 1.2 10*3/uL (ref 1.0–3.6)
MCH: 28.7 pg (ref 26.0–34.0)
MCHC: 34.5 g/dL (ref 32.0–36.0)
MCV: 83.3 fL (ref 80.0–100.0)
Monocytes Absolute: 0.7 10*3/uL (ref 0.2–1.0)
Monocytes Relative: 19 %
Neutro Abs: 1.9 10*3/uL (ref 1.4–6.5)
Neutrophils Relative %: 49 %
Platelets: 40 10*3/uL — ABNORMAL LOW (ref 150–440)
RBC: 4.02 MIL/uL — ABNORMAL LOW (ref 4.40–5.90)
RDW: 17.1 % — ABNORMAL HIGH (ref 11.5–14.5)
WBC: 3.9 10*3/uL (ref 3.8–10.6)

## 2018-08-16 LAB — BASIC METABOLIC PANEL
Anion gap: 7 (ref 5–15)
BUN: 20 mg/dL (ref 8–23)
CO2: 28 mmol/L (ref 22–32)
Calcium: 9.2 mg/dL (ref 8.9–10.3)
Chloride: 104 mmol/L (ref 98–111)
Creatinine, Ser: 0.94 mg/dL (ref 0.61–1.24)
GFR calc Af Amer: >60 mL/min (ref >60)
GFR calc non Af Amer: >60 mL/min (ref >60)
Glucose, Bld: 103 mg/dL — ABNORMAL HIGH (ref 70–99)
Potassium: 4.4 mmol/L (ref 3.5–5.1)
Sodium: 139 mmol/L (ref 135–145)

## 2018-08-16 LAB — HEMOGLOBIN A1C
Hgb A1c MFr Bld: 5.7 % — ABNORMAL HIGH (ref 4.8–5.6)
Mean Plasma Glucose: 116.89 mg/dL

## 2018-09-29 ENCOUNTER — Other Ambulatory Visit: Payer: Medicare Other

## 2018-10-20 ENCOUNTER — Inpatient Hospital Stay: Payer: Medicare Other | Attending: Oncology

## 2018-10-20 DIAGNOSIS — D469 Myelodysplastic syndrome, unspecified: Secondary | ICD-10-CM | POA: Insufficient documentation

## 2018-10-20 LAB — CBC WITH DIFFERENTIAL/PLATELET
Abs Immature Granulocytes: 0.01 10*3/uL (ref 0.00–0.07)
Basophils Absolute: 0 10*3/uL (ref 0.0–0.1)
Basophils Relative: 1 %
Eosinophils Absolute: 0 10*3/uL (ref 0.0–0.5)
Eosinophils Relative: 1 %
HCT: 33.8 % — ABNORMAL LOW (ref 39.0–52.0)
Hemoglobin: 10.7 g/dL — ABNORMAL LOW (ref 13.0–17.0)
Immature Granulocytes: 0 %
Lymphocytes Relative: 25 %
Lymphs Abs: 0.8 10*3/uL (ref 0.7–4.0)
MCH: 27.9 pg (ref 26.0–34.0)
MCHC: 31.7 g/dL (ref 30.0–36.0)
MCV: 88 fL (ref 80.0–100.0)
Monocytes Absolute: 0.7 10*3/uL (ref 0.1–1.0)
Monocytes Relative: 23 %
Neutro Abs: 1.6 10*3/uL — ABNORMAL LOW (ref 1.7–7.7)
Neutrophils Relative %: 50 %
Platelets: 42 10*3/uL — ABNORMAL LOW (ref 150–400)
RBC: 3.84 MIL/uL — ABNORMAL LOW (ref 4.22–5.81)
RDW: 14.5 % (ref 11.5–15.5)
WBC: 3.2 10*3/uL — ABNORMAL LOW (ref 4.0–10.5)
nRBC: 0 % (ref 0.0–0.2)

## 2018-11-05 ENCOUNTER — Other Ambulatory Visit (HOSPITAL_COMMUNITY): Payer: Medicare Other

## 2018-11-05 ENCOUNTER — Ambulatory Visit: Payer: Medicare Other | Admitting: Family

## 2018-11-05 ENCOUNTER — Encounter (HOSPITAL_COMMUNITY): Payer: Medicare Other

## 2018-12-08 ENCOUNTER — Other Ambulatory Visit: Payer: Self-pay | Admitting: Physician Assistant

## 2018-12-08 DIAGNOSIS — R221 Localized swelling, mass and lump, neck: Secondary | ICD-10-CM

## 2018-12-20 ENCOUNTER — Ambulatory Visit: Payer: Medicare Other | Attending: Physician Assistant

## 2018-12-23 ENCOUNTER — Telehealth: Payer: Self-pay | Admitting: Family

## 2018-12-23 NOTE — Telephone Encounter (Signed)
Pt called today concerned because his appt for Carotid and AAA labs, and appt with Vinnie Level on Jan 15th were rescheduled to Feb 21st.  He is very concerned because his "Physician in [place] wants to do a procedure on him based on new nodules on both Carotids".  He is more concerned about having Carotid study and follow up appt done than his AAA study.  Per [place] Constable., Lab Supervisor, we were able to get him in for Carotid U/S on 01/15th.  We scheduled the other appts for another date.  Pt verbalized understanding.   Thurston Hole., LPN

## 2019-01-01 ENCOUNTER — Emergency Department: Payer: Medicare Other

## 2019-01-01 ENCOUNTER — Emergency Department
Admission: EM | Admit: 2019-01-01 | Discharge: 2019-01-01 | Disposition: A | Discharge status: Home / Self Care | Payer: Medicare Other | Attending: Emergency Medicine | Admitting: Emergency Medicine

## 2019-01-01 ENCOUNTER — Encounter: Payer: Self-pay | Admitting: Emergency Medicine

## 2019-01-01 DIAGNOSIS — Z5321 Procedure and treatment not carried out due to patient leaving prior to being seen by health care provider: Secondary | ICD-10-CM | POA: Insufficient documentation

## 2019-01-01 DIAGNOSIS — R002 Palpitations: Secondary | ICD-10-CM | POA: Insufficient documentation

## 2019-01-01 LAB — BASIC METABOLIC PANEL
Anion gap: 7 (ref 5–15)
BUN: 17 mg/dL (ref 8–23)
CO2: 27 mmol/L (ref 22–32)
Calcium: 9.2 mg/dL (ref 8.9–10.3)
Chloride: 105 mmol/L (ref 98–111)
Creatinine, Ser: 0.95 mg/dL (ref 0.61–1.24)
GFR calc Af Amer: >60 mL/min (ref >60)
GFR calc non Af Amer: >60 mL/min (ref >60)
Glucose, Bld: 118 mg/dL — ABNORMAL HIGH (ref 70–99)
Potassium: 3.7 mmol/L (ref 3.5–5.1)
Sodium: 139 mmol/L (ref 135–145)

## 2019-01-01 LAB — CBC
HCT: 35.9 % — ABNORMAL LOW (ref 39.0–52.0)
Hemoglobin: 11.6 g/dL — ABNORMAL LOW (ref 13.0–17.0)
MCH: 28.4 pg (ref 26.0–34.0)
MCHC: 32.3 g/dL (ref 30.0–36.0)
MCV: 88 fL (ref 80.0–100.0)
Platelets: 60 10*3/uL — ABNORMAL LOW (ref 150–400)
RBC: 4.08 MIL/uL — ABNORMAL LOW (ref 4.22–5.81)
RDW: 13.3 % (ref 11.5–15.5)
WBC: 4.3 10*3/uL (ref 4.0–10.5)
nRBC: 0 % (ref 0.0–0.2)

## 2019-01-01 LAB — TROPONIN I: Troponin I: <0.03 ng/mL (ref <0.03)

## 2019-01-01 NOTE — ED Notes (Signed)
Called for revital, not in subwait or main wait. Assume LWBS.

## 2019-01-01 NOTE — ED Triage Notes (Signed)
States had open heart surgery in April (CABG x 4).  Since that time patient has felt great.  Recent head and chest cold, started on Mucinex and Benzonatate.  Arrives today, stating that over the past couple of days he has had a irregular heart beat.  Denies complaint of feeling poorly, states he feels fine.  Initially went to [facility], and was sent to ED for evaluation due to irregular heart rate.  Also states that for the past  Days SBP has been in the 90's and patient has held his BP medications.

## 2019-01-01 NOTE — ED Notes (Signed)
Called for revital, not in main or subwait.

## 2019-01-01 NOTE — ED Notes (Signed)
Called for revital, not in sub wait or main wait.

## 2019-01-01 NOTE — ED Notes (Signed)
Per xray patient refused chest x ray.

## 2019-01-01 NOTE — Progress Notes (Deleted)
[place]  [phone] [phone]  ID: Hale Drone OB: May 21, 1926  MR#: [id number]  XTA#:[id number]  Patient Care Team: [name], MD as PCP - General (Family Medicine) Jettie Booze, MD as PCP - Cardiology (Cardiology) Lollie Sails, MD as Consulting Physician (Gastroenterology)  CHIEF COMPLAINT: MDS, thrombocytopenia  INTERVAL HISTORY: Patient returns to clinic today for repeat laboratory and further evaluation.  He underwent cardiac bypass surgery in April which required a platelet transfusion to improve his platelet count.  He tolerated the procedure well without significant problems or side effects.  He currently feels well and is asymptomatic. He has no neurologic complaints.  He denies any easy bleeding or bruising.  He has a good appetite and denies weight loss.  He has no chest pain, shortness of breath, or cough.  He has no nausea, vomiting, constipation, or diarrhea.  He has no urinary complaints.  Patient offers no specific complaints today.  REVIEW OF SYSTEMS:   Review of Systems  Constitutional: Negative.  Negative for fever, malaise/fatigue and weight loss.  Respiratory: Negative.  Negative for cough and shortness of breath.   Cardiovascular: Negative.  Negative for chest pain and leg swelling.  Gastrointestinal: Negative for abdominal pain, blood in stool, melena, nausea and vomiting.  Genitourinary: Negative.  Negative for hematuria.  Musculoskeletal: Negative.  Negative for back pain.  Skin: Negative.  Negative for rash.  Neurological: Negative.  Negative for sensory change, focal weakness and weakness.  Endo/Heme/Allergies: Does not bruise/bleed easily.  Psychiatric/Behavioral: Negative.  The patient is not nervous/anxious and does not have insomnia.     As per HPI. Otherwise, a complete review of systems is negative.  PAST MEDICAL HISTORY: Past Medical History:  Diagnosis Date  . AAA (abdominal  aortic aneurysm) ([place]) 1999   a. treated with open repair in 1999 with subsequent distal anastomotic pseudoaneurysm that is being followed by Dr. Oneida Alar.  . Adenomatous colon polyp   . Aorta aneurysm ([place])   . Arthritis   . Basal cell cancer Dec. 24, 2013   Nose  . CAD (coronary artery disease)    a. s/p CABG 03/2018.  Marland Kitchen Cancer [facility]) Aug. 2013   prostate Seeding  implant  . Carotid artery occlusion   . Enlarged prostate 2008   Thermotherapy  . Gastroesophageal reflux disease   . GI bleed   . Hyperlipidemia   . Hypertension   . Mild aortic stenosis   . Myelodysplasia (myelodysplastic syndrome) ([place])   . Pancytopenia ([place])    sees Dr. Grayland Ormond  . Peripheral vascular disease ([place])   . Postoperative atrial fibrillation ([place])   . Psoriasis   . S/P CABG (coronary artery bypass graft)     PAST SURGICAL HISTORY: Past Surgical History:  Procedure Laterality Date  . ABDOMINAL AORTIC ANEURYSM REPAIR  1999  . CARDIAC CATHETERIZATION  10/2009  . CHOLECYSTECTOMY    . CORONARY ARTERY BYPASS GRAFT N/A 04/08/2018   Procedure: CORONARY ARTERY BYPASS GRAFTING (CABG) x four, using left mammary artery and right leg greater saphenous vein harvested endoscopically.;  Surgeon: Gaye Pollack, MD;  [facility] OR;  Service: Open Heart Surgery;  Laterality: N/A;  . ESOPHAGOGASTRODUODENOSCOPY (EGD) WITH PROPOFOL N/A 06/26/2016   Procedure: ESOPHAGOGASTRODUODENOSCOPY (EGD) WITH PROPOFOL;  Surgeon: Lollie Sails, MD;  Location: [facility] ENDOSCOPY;  Service: Endoscopy;  Laterality: N/A;  . LEFT HEART CATH AND CORONARY ANGIOGRAPHY N/A 03/15/2018   Procedure: LEFT HEART CATH AND CORONARY ANGIOGRAPHY;  Surgeon: Jettie Booze, MD;  Location: [place] CV LAB;  Service: Cardiovascular;  Laterality: N/A;  . PROSTATE SURGERY    . SPINE SURGERY  2009   ruptured disc  . TEE WITHOUT CARDIOVERSION N/A 04/08/2018   Procedure: TRANSESOPHAGEAL ECHOCARDIOGRAM (TEE);  Surgeon: Gaye Pollack, MD;  Location: [place];  Service: Open Heart Surgery;  Laterality: N/A;  . Visceral angiogram  05/14/2012   Right  groin area  . VISCERAL ANGIOGRAM N/A 05/14/2012   Procedure: VISCERAL ANGIOGRAM;  Surgeon: Elam Dutch, MD;  Location: [facility] CATH LAB;  Service: Cardiovascular;  Laterality: N/A;    FAMILY HISTORY Family History  Problem Relation Age of Onset  . Heart attack Father   . Heart disease Father        After age 73  . COPD Father   . Cancer Sister        Breast and Brain  . Heart disease Brother        before age 43  . Heart attack Brother        ADVANCED DIRECTIVES:    HEALTH MAINTENANCE: Social History   Tobacco Use  . Smoking status: Former Smoker    Types: Cigarettes    Last attempt to quit: 04/29/1997    Years since quitting: 21.6  . Smokeless tobacco: Never Used  Substance Use Topics  . Alcohol use: No  . Drug use: No     Allergies  Allergen Reactions  . Ciprofloxacin Other (See Comments)    Abdominal Pain     Current Outpatient Medications  Medication Sig Dispense Refill  . amLODipine (NORVASC) 10 MG tablet Take 0.5 tablets (5 mg total) by mouth daily. 60 tablet 1  . Glucosamine-Chondroitin (COSAMIN DS PO) Take 2 tablets by mouth daily at 12 noon.    Marland Kitchen MAGNESIUM CITRATE PO Take 400 mg by mouth daily after supper.     . Melatonin 500 MCG TBDP Take 500 mcg by mouth at bedtime.    . Multiple Vitamin (MULTIVITAMIN WITH MINERALS) TABS tablet Take 1 tablet by mouth daily at 12 noon. Centrum Silver    . oxyCODONE-acetaminophen (PERCOCET) 7.5-325 MG tablet Take 1 tablet by mouth every 4 (four) hours as needed for severe pain (RX for back pain Dr Dyke Maes).    . polyethylene glycol (MIRALAX / GLYCOLAX) packet Take 17 g by mouth daily. (Patient not taking: Reported on 06/29/2018) 14 each 0  . valsartan-hydrochlorothiazide (DIOVAN HCT) 160-12.5 MG tablet Take 1 tablet by mouth daily. 30 tablet 1   No current facility-administered medications for this visit.      OBJECTIVE: There were no vitals filed for this visit.   There is no height or weight on file to calculate BMI.    ECOG FS:0 - Asymptomatic  General: Well-developed, well-nourished, no acute distress. Eyes: Pink conjunctiva, anicteric sclera. HEENT: Normocephalic, moist mucous membranes, clear oropharnyx. Lungs: Clear to auscultation bilaterally. Heart: Regular rate and rhythm. No rubs, murmurs, or gallops. Abdomen: Soft, nontender, nondistended. No organomegaly noted, normoactive bowel sounds. Musculoskeletal: No edema, cyanosis, or clubbing. Neuro: Alert, answering all questions appropriately. Cranial nerves grossly intact. Skin: No rashes or petechiae noted. Psych: Normal affect.   LAB RESULTS:  Lab Results  Component Value Date   NA 139 08/16/2018   K 4.4 08/16/2018   CL 104 08/16/2018   CO2 28 08/16/2018   GLUCOSE 103 (H) 08/16/2018   BUN 20 08/16/2018   CREATININE 0.94 08/16/2018   CALCIUM 9.2 08/16/2018   PROT 5.0 (L) 04/12/2018   ALBUMIN  3.0 (L) 04/12/2018   AST 29 04/12/2018   ALT 16 (L) 04/12/2018   ALKPHOS 41 04/12/2018   BILITOT 1.3 (H) 04/12/2018   GFRNONAA >60 08/16/2018   GFRAA >60 08/16/2018    Lab Results  Component Value Date   WBC 3.2 (L) 10/20/2018   NEUTROABS 1.6 (L) 10/20/2018   HGB 10.7 (L) 10/20/2018   HCT 33.8 (L) 10/20/2018   MCV 88.0 10/20/2018   PLT 42 (L) 10/20/2018     STUDIES: No results found.  ASSESSMENT: MDS, thrombocytopenia.  PLAN:    1. MDS: Confirmed by bone marrow biopsy. 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  Given his absolute increase in monocytes the possibility of an early/evolving CMML cannot be excluded. Cytogenics are normal 46XY. No increase in blast count.  Return to clinic in 3 months with repeat laboratory work and then in 6 months with repeat laboratory work and further evaluation.    2. 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 Patient's  platelets responded appropriately with transfusion at the time of his cardiac bypass and now have trended back down to approximately his baseline of 40.  No intervention is needed at this time.  Previously prednisone worked temporarily, but patient had significant side effects with treatment.  Return to clinic as above. 3.  Prostate cancer: Patient is status post seed implantation.  His last PSA on March 19, 2015 was reported as 0.03.  4.  Cardiac disease: Continue follow-up and treatment per cardiac surgery. 5.  History of GI bleed: Continue monitoring by GI.  Patient has discontinued Aciphex. 6.  Leukopenia: Mild, likely secondary to underlying MDS.  Monitor. 7.  Anemia: Patient's hemoglobin has trended up since his cardiac bypass surgery   Patient expressed understanding and was in agreement with this plan. He also understands that He can call clinic at any time with any questions, concerns, or complaints.   Lloyd Huger, MD   01/01/2019 8:23 AM

## 2019-01-03 ENCOUNTER — Encounter: Payer: Self-pay | Admitting: Interventional Cardiology

## 2019-01-03 ENCOUNTER — Ambulatory Visit (INDEPENDENT_AMBULATORY_CARE_PROVIDER_SITE_OTHER): Payer: Medicare Other | Admitting: Interventional Cardiology

## 2019-01-03 ENCOUNTER — Telehealth: Payer: Self-pay | Admitting: Interventional Cardiology

## 2019-01-03 VITALS — BP 134/64 | HR 70 | Ht 69.0 in | Wt 161.2 lb

## 2019-01-03 DIAGNOSIS — I491 Atrial premature depolarization: Secondary | ICD-10-CM

## 2019-01-03 DIAGNOSIS — I10 Essential (primary) hypertension: Secondary | ICD-10-CM | POA: Diagnosis not present

## 2019-01-03 DIAGNOSIS — I25118 Atherosclerotic heart disease of native coronary artery with other forms of angina pectoris: Secondary | ICD-10-CM

## 2019-01-03 DIAGNOSIS — R001 Bradycardia, unspecified: Secondary | ICD-10-CM | POA: Diagnosis not present

## 2019-01-03 NOTE — Telephone Encounter (Signed)
New message   Pt c/o BP issue: STAT if pt c/o blurred vision, one-sided weakness or slurred speech  1. What are your last 5 BP readings? 97/57  92/58    94/57   97/54 117/61, patient states that his hr is 45 and 47 as of yesterday.   2. Are you having any other symptoms (ex. Dizziness, headache, blurred vision, passed out)? No   3. What is your BP issue? Patient did not take his blood pressure medicine  when it got low prior to 12/29/2018. Patient states that he has an irregular heartbeat also. Patient states that benzonatate  100 mg caused his b/p issue and irregular heartbeat.

## 2019-01-03 NOTE — Progress Notes (Signed)
.     Cardiology Office Note   Date:  01/03/2019   ID:  [name], DOB December 12, 1926, MRN [id number]  PCP:  Thomas Pink, MD    No chief complaint on file.  Slow HR  Wt Readings from Last 3 Encounters:  01/03/19 161 lb 3.2 oz (73.1 kg)  01/01/19 150 lb (68 kg)  06/29/18 153 lb (69.4 kg)       History of Present Illness: [name] is a 85 y.o. male  Who has had CABG.  He took benzonate for a cough.  He noticed a slower HR, to the 40s, and he had some mild lightheadedness and low BP readings.  He stopped the medicine afte ra few ays and his BP improved but HR stayed low.   Prior to this, he has been feeling well.  He has been training for the senior games.  Denies : Chest pain. Dizziness. Leg edema. Nitroglycerin use. Orthopnea. Palpitations. Paroxysmal nocturnal dyspnea. Shortness of breath. Syncope.      Past Medical History:  Diagnosis Date  . AAA (abdominal aortic aneurysm) ([place]) 1999   a. treated with open repair in 1999 with subsequent distal anastomotic pseudoaneurysm that is being followed by Dr. Oneida Alar.  . Adenomatous colon polyp   . Aorta aneurysm ([place])   . Arthritis   . Basal cell cancer Dec. 24, 2013   Nose  . CAD (coronary artery disease)    a. s/p CABG 03/2018.  Marland Kitchen Cancer [facility]) Aug. 2013   prostate Seeding  implant  . Carotid artery occlusion   . Enlarged prostate 2008   Thermotherapy  . Gastroesophageal reflux disease   . GI bleed   . Hyperlipidemia   . Hypertension   . Mild aortic stenosis   . Myelodysplasia (myelodysplastic syndrome) ([place])   . Pancytopenia ([place])    sees Dr. Grayland Ormond  . Peripheral vascular disease ([place])   . Postoperative atrial fibrillation ([place])   . Psoriasis   . S/P CABG (coronary artery bypass graft)     Past Surgical History:  Procedure Laterality Date  . ABDOMINAL AORTIC ANEURYSM REPAIR  1999  . CARDIAC CATHETERIZATION  10/2009  . CHOLECYSTECTOMY    . CORONARY ARTERY BYPASS GRAFT N/A 04/08/2018    Procedure: CORONARY ARTERY BYPASS GRAFTING (CABG) x four, using left mammary artery and right leg greater saphenous vein harvested endoscopically.;  Surgeon: Gaye Pollack, MD;  Location: [facility] OR;  Service: Open Heart Surgery;  Laterality: N/A;  . ESOPHAGOGASTRODUODENOSCOPY (EGD) WITH PROPOFOL N/A 06/26/2016   Procedure: ESOPHAGOGASTRODUODENOSCOPY (EGD) WITH PROPOFOL;  Surgeon: Lollie Sails, MD;  Location: [facility] ENDOSCOPY;  Service: Endoscopy;  Laterality: N/A;  . LEFT HEART CATH AND CORONARY ANGIOGRAPHY N/A 03/15/2018   Procedure: LEFT HEART CATH AND CORONARY ANGIOGRAPHY;  Surgeon: Jettie Booze, MD;  Location: [place] CV LAB;  Service: Cardiovascular;  Laterality: N/A;  . PROSTATE SURGERY    . SPINE SURGERY  2009   ruptured disc  . TEE WITHOUT CARDIOVERSION N/A 04/08/2018   Procedure: TRANSESOPHAGEAL ECHOCARDIOGRAM (TEE);  Surgeon: Gaye Pollack, MD;  Location: [place];  Service: Open Heart Surgery;  Laterality: N/A;  . Visceral angiogram  05/14/2012   Right  groin area  . VISCERAL ANGIOGRAM N/A 05/14/2012   Procedure: VISCERAL ANGIOGRAM;  Surgeon: Elam Dutch, MD;  Location[facility] CATH LAB;  Service: Cardiovascular;  Laterality: N/A;     Current Outpatient Medications  Medication Sig Dispense Refill  . amLODipine (NORVASC) 10 MG tablet Take  0.5 tablets (5 mg total) by mouth daily. 60 tablet 1  . Glucosamine-Chondroitin (COSAMIN DS PO) Take 2 tablets by mouth daily at 12 noon.    Marland Kitchen MAGNESIUM CITRATE PO Take 400 mg by mouth daily after supper.     . Melatonin 500 MCG TBDP Take 500 mcg by mouth at bedtime.    . Multiple Vitamin (MULTIVITAMIN WITH MINERALS) TABS tablet Take 1 tablet by mouth daily at 12 noon. Centrum Silver    . oxyCODONE-acetaminophen (PERCOCET) 7.5-325 MG tablet Take 1 tablet by mouth every 4 (four) hours as needed for severe pain (RX for back pain Dr Dyke Maes).    . polyethylene glycol (MIRALAX / GLYCOLAX) packet Take 17 g by mouth daily. 14 each 0  .  rosuvastatin (CRESTOR) 20 MG tablet Take 20 mg by mouth daily.    . valsartan-hydrochlorothiazide (DIOVAN HCT) 160-12.5 MG tablet Take 1 tablet by mouth daily. 30 tablet 1   No current facility-administered medications for this visit.     Allergies:   Ciprofloxacin    Social History:  The patient  reports that he quit smoking about 21 years ago. His smoking use included cigarettes. He has never used smokeless tobacco. He reports that he does not drink alcohol or use drugs.   Family History:  The patient's family history includes COPD in his father; Cancer in his sister; Heart attack in his brother and father; Heart disease in his brother and father.    ROS:  Please see the history of present illness.   Otherwise, review of systems are positive for feeling an irregular pulse at times.   All other systems are reviewed and negative.    PHYSICAL EXAM: VS:  BP 134/64   Pulse 70   Ht 5\' 9"  (1.753 m)   Wt 161 lb 3.2 oz (73.1 kg)   SpO2 99%   BMI 23.81 kg/m  , BMI Body mass index is 23.81 kg/m. GEN: Well nourished, well developed, in no acute distress  HEENT: normal  Neck: no JVD, carotid bruits, or masses Cardiac: RRR with premature beats; no murmurs, rubs, or gallops,no edema  Respiratory:  clear to auscultation bilaterally, normal work of breathing GI: soft, nontender, nondistended, + BS MS: no deformity or atrophy  Skin: warm and dry, no rash Neuro:  Strength and sensation are intact, slow gait Psych: euthymic mood, full affect   EKG:   The ekg ordered today demonstrates normal sinus rhythm, right bundle branch block, LAFB, PACs   Recent Labs: 04/12/2018: ALT 16 04/14/2018: Magnesium 2.0 01/01/2019: BUN 17; Creatinine, Ser 0.95; Hemoglobin 11.6; Platelets 60; Potassium 3.7; Sodium 139   Lipid Panel No results found for: CHOL, TRIG, HDL, CHOLHDL, VLDL, LDLCALC, LDLDIRECT   Other studies Reviewed: Additional studies/ records that were reviewed today with results  demonstrating: Cr normal in 01/01/19.   ASSESSMENT AND PLAN:  1. CAD: No angina.  Continue aggressive secondary prevention. Lipids checked with PMD.  2. Bradycardia: I think some of this may be PACs causing his heart rate to be under counted.  Will check 24-hour Holter monitor.  Mild lightheadedness, but this seems rather nonspecific.  He has not had syncope.  I think he will be fine to continue to train and participate in his senior games. 3. Hypertension: Continue blood pressure medications.   Current medicines are reviewed at length with the patient today.  The patient concerns regarding his medicines were addressed.  The following changes have been made:  No change  Labs/ tests ordered  today include:  No orders of the defined types were placed in this encounter.   Recommend 150 minutes/week of aerobic exercise Low fat, low carb, high fiber diet recommended  Disposition:   FU in 6 months   Signed, Larae Grooms, MD  01/03/2019 3:07 PM    [facility] [address], [place], [place]  [address] Phone: [phone]; Fax: [phone]

## 2019-01-03 NOTE — Telephone Encounter (Signed)
Spoke with the patient, on 1/8 he went to outpatient clinic for a head cold. He was prescribed Mucinex and benzonatate 100 mg. He started taking his medication and his blood pressure was 97/57 and 45 HR. He stated he had lightheadedness and started to notice an irregular heart rhythm and a skip beat sensation. This is the first time he has had his heart rate this low. He stopped taking the medication for his cold and held his blood pressure medication due to his hypotension. He stated he still continued to have bradycardia over the weekend and hypotension. This morning his pressure was 120/63 HR: 45 and he took his medication. Spoke with Dr. Irish Lack, he stated the patient could be added to his schedule if he was symptomatic. The patient accepted and was added at 2:40 pm.

## 2019-01-03 NOTE — Patient Instructions (Signed)
Medication Instructions:  Your physician recommends that you continue on your current medications as directed. Please refer to the Current Medication list given to you today.  If you need a refill on your cardiac medications before your next appointment, please call your pharmacy.   Lab work: None Ordered  If you have labs (blood work) drawn today and your tests are completely normal, you will receive your results only by: Marland Kitchen MyChart Message (if you have MyChart) OR . A paper copy in the mail If you have any lab test that is abnormal or we need to change your treatment, we will call you to review the results.  Testing/Procedures: Your physician has recommended that you wear a 24 hour holter monitor. Holter monitors are medical devices that record the heart's electrical activity. Doctors most often use these monitors to diagnose arrhythmias. Arrhythmias are problems with the speed or rhythm of the heartbeat. The monitor is a small, portable device. You can wear one while you do your normal daily activities. This is usually used to diagnose what is causing palpitations/syncope (passing out).   Follow-Up: At [facility], you and your health needs are our priority.  As part of our continuing mission to provide you with exceptional heart care, we have created designated Provider Care Teams.  These Care Teams include your primary Cardiologist (physician) and Advanced Practice Providers (APPs -  Physician Assistants and Nurse Practitioners) who all work together to provide you with the care you need, when you need it. . You will need a follow up appointment in 6 months.  Please call our office 2 months in advance to schedule this appointment.  You may see Casandra Doffing, MD or one of the following Advanced Practice Providers on your designated Care Team:   . Lyda Jester, PA-C . Dayna Dunn, PA-C . Ermalinda Barrios, PA-C  Any Other Special Instructions Will Be Listed Below (If Applicable).

## 2019-01-05 ENCOUNTER — Other Ambulatory Visit (HOSPITAL_COMMUNITY): Payer: Medicare Other

## 2019-01-05 ENCOUNTER — Encounter (HOSPITAL_COMMUNITY): Payer: Medicare Other

## 2019-01-05 ENCOUNTER — Ambulatory Visit: Payer: Medicare Other | Admitting: Family

## 2019-01-06 ENCOUNTER — Other Ambulatory Visit: Payer: Self-pay | Admitting: Surgery

## 2019-01-06 ENCOUNTER — Inpatient Hospital Stay: Payer: Medicare Other | Admitting: Oncology

## 2019-01-06 ENCOUNTER — Inpatient Hospital Stay: Payer: Medicare Other

## 2019-01-18 ENCOUNTER — Other Ambulatory Visit (HOSPITAL_COMMUNITY): Payer: Medicare Other

## 2019-01-18 ENCOUNTER — Ambulatory Visit (INDEPENDENT_AMBULATORY_CARE_PROVIDER_SITE_OTHER): Payer: Medicare Other | Admitting: Physician Assistant

## 2019-01-18 ENCOUNTER — Ambulatory Visit (INDEPENDENT_AMBULATORY_CARE_PROVIDER_SITE_OTHER)
Admission: RE | Admit: 2019-01-18 | Discharge: 2019-01-18 | Disposition: A | Discharge status: Home / Self Care | Payer: Medicare Other | Source: Ambulatory Visit | Attending: Vascular Surgery | Admitting: Vascular Surgery

## 2019-01-18 ENCOUNTER — Ambulatory Visit: Payer: Medicare Other | Admitting: Family

## 2019-01-18 ENCOUNTER — Ambulatory Visit (HOSPITAL_COMMUNITY)
Admission: RE | Admit: 2019-01-18 | Discharge: 2019-01-18 | Disposition: A | Discharge status: Home / Self Care | Payer: Medicare Other | Source: Ambulatory Visit | Attending: Vascular Surgery | Admitting: Vascular Surgery

## 2019-01-18 ENCOUNTER — Encounter: Payer: Self-pay | Admitting: Family

## 2019-01-18 ENCOUNTER — Other Ambulatory Visit: Payer: Self-pay

## 2019-01-18 VITALS — BP 137/64 | HR 47 | Temp 97.0°F | Resp 16 | Ht 69.0 in | Wt 155.0 lb

## 2019-01-18 DIAGNOSIS — I6529 Occlusion and stenosis of unspecified carotid artery: Secondary | ICD-10-CM | POA: Diagnosis present

## 2019-01-18 DIAGNOSIS — I714 Abdominal aortic aneurysm, without rupture, unspecified: Secondary | ICD-10-CM

## 2019-01-18 NOTE — Progress Notes (Signed)
History of Present Illness:  Patient is a 85 y.o. year old male who presents for evaluation of abdominal aortic aneurysm and past for bilateral asymptomatic moderate carotid stenosis. He reports a cold with cough recently and took over the counter cough medication which lead to heart palpitations.  He is being followed by cardiology and will have a halter monitor study soon.  He denies weakness, aphasia, or amaurosis.  He denies new sudden abdominal or lumbar pain.  He has chronic lumbar pain with a history of discectomy surgery.  He also has a known aortic anastomotic pseudoaneurysm. He apparently had repair of the abdominal aortic aneurysm in 1999 at [facility]. He continues to deny any abdominal pain but does have chronic back pain. Previous CT scan in July 2017 showed a 15 mm pseudoaneurysm at the distal anastomosis with the remainder of the graft intact.  We have been following the pseudoaneursym on ultrasound.   He is very active overall and continues to exercise frequently.       Other medical problems include CAD s/p CABG 2019, thrombocytopeniamyelodysplastic disorder , HTN and hyperlipidemia.     Past Medical History:  Diagnosis Date  . AAA (abdominal aortic aneurysm) ([place]) 1999   a. treated with open repair in 1999 with subsequent distal anastomotic pseudoaneurysm that is being followed by Dr. Oneida Alar.  . Adenomatous colon polyp   . Aorta aneurysm ([place])   . Arthritis   . Basal cell cancer Dec. 24, 2013   Nose  . CAD (coronary artery disease)    a. s/p CABG 03/2018.  Marland Kitchen Cancer [facility]) Aug. 2013   prostate Seeding  implant  . Carotid artery occlusion   . Enlarged prostate 2008   Thermotherapy  . Gastroesophageal reflux disease   . GI bleed   . Hyperlipidemia   . Hypertension   . Mild aortic stenosis   . Myelodysplasia (myelodysplastic syndrome) ([place])   . Pancytopenia ([place])    sees Dr. Grayland Ormond  . Peripheral vascular disease ([place])   . Postoperative atrial  fibrillation ([place])   . Psoriasis   . S/P CABG (coronary artery bypass graft)     Past Surgical History:  Procedure Laterality Date  . ABDOMINAL AORTIC ANEURYSM REPAIR  1999  . CARDIAC CATHETERIZATION  10/2009  . CHOLECYSTECTOMY    . CORONARY ARTERY BYPASS GRAFT N/A 04/08/2018   Procedure: CORONARY ARTERY BYPASS GRAFTING (CABG) x four, using left mammary artery and right leg greater saphenous vein harvested endoscopically.;  Surgeon: Gaye Pollack, MD;  Location: [facility] OR;  Service: Open Heart Surgery;  Laterality: N/A;  . ESOPHAGOGASTRODUODENOSCOPY (EGD) WITH PROPOFOL N/A 06/26/2016   Procedure: ESOPHAGOGASTRODUODENOSCOPY (EGD) WITH PROPOFOL;  Surgeon: Lollie Sails, MD;  Location: [facility] ENDOSCOPY;  Service: Endoscopy;  Laterality: N/A;  . LEFT HEART CATH AND CORONARY ANGIOGRAPHY N/A 03/15/2018   Procedure: LEFT HEART CATH AND CORONARY ANGIOGRAPHY;  Surgeon: Jettie Booze, MD;  Location: [place] CV LAB;  Service: Cardiovascular;  Laterality: N/A;  . PROSTATE SURGERY    . SPINE SURGERY  2009   ruptured disc  . TEE WITHOUT CARDIOVERSION N/A 04/08/2018   Procedure: TRANSESOPHAGEAL ECHOCARDIOGRAM (TEE);  Surgeon: Gaye Pollack, MD;  Location: [place];  Service: Open Heart Surgery;  Laterality: N/A;  . Visceral angiogram  05/14/2012   Right  groin area  . VISCERAL ANGIOGRAM N/A 05/14/2012   Procedure: VISCERAL ANGIOGRAM;  Surgeon: Elam Dutch, MD;  Location: [facility] CATH LAB;  Service: Cardiovascular;  Laterality: N/A;  Social History Social History   Tobacco Use  . Smoking status: Former Smoker    Types: Cigarettes    Last attempt to quit: 04/29/1997    Years since quitting: 21.7  . Smokeless tobacco: Never Used  Substance Use Topics  . Alcohol use: No  . Drug use: No    Family History Family History  Problem Relation Age of Onset  . Heart attack Father   . Heart disease Father        After age 26  . COPD Father   . Cancer Sister        Breast and Brain  . Heart  disease Brother        before age 80  . Heart attack Brother     Allergies  Allergies  Allergen Reactions  . Ciprofloxacin Other (See Comments)    Abdominal Pain      Current Outpatient Medications  Medication Sig Dispense Refill  . amLODipine (NORVASC) 10 MG tablet Take 0.5 tablets (5 mg total) by mouth daily. 60 tablet 1  . Glucosamine-Chondroitin (COSAMIN DS PO) Take 2 tablets by mouth daily at 12 noon.    Marland Kitchen MAGNESIUM CITRATE PO Take 400 mg by mouth daily after supper.     . Melatonin 500 MCG TBDP Take 500 mcg by mouth at bedtime.    . Multiple Vitamin (MULTIVITAMIN WITH MINERALS) TABS tablet Take 1 tablet by mouth daily at 12 noon. Centrum Silver    . oxyCODONE-acetaminophen (PERCOCET/ROXICET) 5-325 MG tablet Take by mouth.    . oxyCODONE-acetaminophen (PERCOCET/ROXICET) 5-325 MG tablet     . polyethylene glycol (MIRALAX / GLYCOLAX) packet Take 17 g by mouth daily. 14 each 0  . rosuvastatin (CRESTOR) 20 MG tablet Take 20 mg by mouth daily.    . valsartan-hydrochlorothiazide (DIOVAN HCT) 160-12.5 MG tablet Take 1 tablet by mouth daily. 30 tablet 1  . benzonatate (TESSALON) 100 MG capsule TAKE 1 CAPSULE BY MOUTH THREE TIMES A DAY AS NEEDED FOR COUGH     No current facility-administered medications for this visit.     ROS:   General:  No weight loss, Fever, chills  HEENT: No recent headaches, no nasal bleeding, no visual changes, no sore throat  Neurologic: No dizziness, blackouts, seizures. No recent symptoms of stroke or mini- stroke. No recent episodes of slurred speech, or temporary blindness.  Cardiac: No recent episodes of chest pain/pressure, no shortness of breath at rest.  No shortness of breath with exertion.  Denies history of atrial fibrillation or irregular heartbeat [x]  new palpatations.  Vascular: No history of rest pain in feet.  No history of claudication.  No history of non-healing ulcer, No history of DVT   Pulmonary: No home oxygen, no productive  cough, no hemoptysis,  No asthma or wheezing  Musculoskeletal:  [ ]  Arthritis, [x ] Low back pain,  [ ]  Joint pain  Hematologic:No history of hypercoagulable state.  No history of easy bleeding.  No history of anemia  Gastrointestinal: No hematochezia or melena,  No gastroesophageal reflux, no trouble swallowing  Urinary: [ ]  chronic Kidney disease, [ ]  on HD - [ ]  MWF or [ ]  TTHS, [ ]  Burning with urination, [ ]  Frequent urination, [ ]  Difficulty urinating;   Skin: No rashes  Psychological: No history of anxiety,  No history of depression   Physical Examination  Vitals:   01/18/19 0905 01/18/19 0910  BP: 130/68 137/64  Pulse: (!) 48 (!) 47  Resp: 16   Temp: Marland Kitchen)  20 F (36.1 C)   TempSrc: Oral   SpO2: 99%   Weight: 155 lb (70.3 kg)   Height: 5\' 9"  (1.753 m)     Body mass index is 22.89 kg/m.  General:  Alert and oriented, no acute distress HEENT: Normal, normocephalic Neck: No bruit or JVD Pulmonary: Clear to auscultation bilaterally Cardiac: Regular Rate and Rhythm without murmur Gastrointestinal: Soft, non-tender, non-distended, no mass, no scars Skin: No rash Extremity Pulses:  2+ radial, brachial, femoral, dorsalis pedis pulses bilaterally Musculoskeletal: No deformity or edema  Neurologic: Upper and lower extremity motor 5/5 and symmetric  DATA:    Right Carotid Findings: +----------+--------+--------+--------+--------------------------+--------+           PSV cm/sEDV cm/sStenosisDescribe                  Comments +----------+--------+--------+--------+--------------------------+--------+ CCA Prox  54      13                                                 +----------+--------+--------+--------+--------------------------+--------+ CCA Mid   67      18                                                 +----------+--------+--------+--------+--------------------------+--------+ CCA Distal88      20              smooth and homogeneous              +----------+--------+--------+--------+--------------------------+--------+ ICA Prox  165     48      40-59%  heterogenous and irregular         +----------+--------+--------+--------+--------------------------+--------+ ICA Mid   196     39              heterogenous and irregular         +----------+--------+--------+--------+--------------------------+--------+ ICA Distal166     37                                                 +----------+--------+--------+--------+--------------------------+--------+ ECA       91      0                                                  +----------+--------+--------+--------+--------------------------+--------+  +----------+--------+-------+----------------+-------------------+           PSV cm/sEDV cmsDescribe        Arm Pressure (mmHG) +----------+--------+-------+----------------+-------------------+ [id number]             Multiphasic, WNL                    +----------+--------+-------+----------------+-------------------+  +---------+--------+-+--------+------+ VertebralPSV cm/s0EDV cm/sAbsent +---------+--------+-+--------+------+    Left Carotid Findings: +----------+--------+--------+--------+--------------------------+--------+           PSV cm/sEDV cm/sStenosisDescribe                  Comments +----------+--------+--------+--------+--------------------------+--------+ CCA Prox  82      19  homogeneous and smooth             +----------+--------+--------+--------+--------------------------+--------+ CCA Mid   75      20              smooth and homogeneous             +----------+--------+--------+--------+--------------------------+--------+ CCA Distal109     19              calcific and irregular             +----------+--------+--------+--------+--------------------------+--------+ ICA Prox  88      16              irregular                           +----------+--------+--------+--------+--------------------------+--------+ ICA Mid   248     73      60-79%  irregular and heterogenous         +----------+--------+--------+--------+--------------------------+--------+ ICA Distal129     49                                                 +----------+--------+--------+--------+--------------------------+--------+ ECA       116     0                                                  +----------+--------+--------+--------+--------------------------+--------+  +----------+--------+--------+----------------+-------------------+ SubclavianPSV cm/sEDV cm/sDescribe        Arm Pressure (mmHG) +----------+--------+--------+----------------+-------------------+           66      0       Multiphasic, WNL                    +----------+--------+--------+----------------+-------------------+  +---------+--------+---+--------+--+---------+ VertebralPSV cm/s113EDV cm/s31Antegrade +---------+--------+---+--------+--+---------+    Summary: Right Carotid: Velocities in the right ICA are consistent with a 40-59%                stenosis.  Left Carotid: Velocities in the left ICA are consistent with a 60-79% stenosis.    Abdominal Aorta Findings: +----------+-------+----------+----------+--------+--------+--------+ Location  AP (cm)Trans (cm)PSV (cm/s)WaveformThrombusComments +----------+-------+----------+----------+--------+--------+--------+ Proximal  2.94   3.00      88                                 +----------+-------+----------+----------+--------+--------+--------+ Mid       2.79   3.04      28                                 +----------+-------+----------+----------+--------+--------+--------+ Distal    1.88   1.82      31                                 +----------+-------+----------+----------+--------+--------+--------+ RT CIA Mid1.6    1.5        117                                +----------+-------+----------+----------+--------+--------+--------+  LT CIA Mid2.1    2.0       197                                +----------+-------+----------+----------+--------+--------+--------+  Thrombosed pseudoaneurysm seen at the distal aorta measuring 2.5 cm x 2.1 cm x 2.1 cm.     ASSESSMENT:  Carotid stenosis B The left CEA has increased in velocity and stenosis, but remains < 80% and he is asymptomatic.  The right CEA is < 59% and unchanged from prior study.   AAA s/p EVAR repair 1999 Thrombosed pseudoaneurysm seen at the distal aorta measuring 2.5 cm x 2.1 cm x 2.1 cm.  Thrombosed pseudoaneurysm is identified with no flow detected within.  PLAN: We will schedule for repeat carotid duplex in 6 months due to the increase in left ICA stenosis noted on this most recent  Carotid duplex and a CTA of the Abd/pelvix to examine the EVAR and distal pseudoaneurysm.        Roxy Horseman  [facility] Office: [phone]  MD in clinic:Early

## 2019-01-19 ENCOUNTER — Ambulatory Visit (INDEPENDENT_AMBULATORY_CARE_PROVIDER_SITE_OTHER): Payer: Medicare Other

## 2019-01-19 DIAGNOSIS — R001 Bradycardia, unspecified: Secondary | ICD-10-CM

## 2019-01-19 DIAGNOSIS — I491 Atrial premature depolarization: Secondary | ICD-10-CM | POA: Diagnosis not present

## 2019-01-21 ENCOUNTER — Other Ambulatory Visit (HOSPITAL_COMMUNITY): Payer: Medicare Other

## 2019-01-21 ENCOUNTER — Ambulatory Visit: Payer: Medicare Other | Admitting: Family

## 2019-01-25 ENCOUNTER — Telehealth: Payer: Self-pay | Admitting: Interventional Cardiology

## 2019-01-25 NOTE — Telephone Encounter (Signed)
New Message    Patient calling for Monitor test results

## 2019-01-25 NOTE — Telephone Encounter (Signed)
Called and made patient aware that the monitor was downloaded yesterday to the company and we should have his results later this week. Patient verbalized understanding and thanked me for the call.

## 2019-01-27 ENCOUNTER — Telehealth: Payer: Self-pay | Admitting: Interventional Cardiology

## 2019-01-27 NOTE — Telephone Encounter (Signed)
New message  Patient's son is calling to get information from a phone call earlier today.  The patient did not understand the information tha was given to him.

## 2019-01-27 NOTE — Telephone Encounter (Signed)
Called and made patient's son aware of monitor results.

## 2019-01-28 NOTE — Progress Notes (Signed)
[name]  [phone] [phone]  ID: Hale Drone OB: 06-Nov-1926  MR#: [id number]  MAU#:[id number]  Patient Care Team: [name], MD as PCP - General (Family Medicine) Jettie Booze, MD as PCP - Cardiology (Cardiology) Lollie Sails, MD as Consulting Physician (Gastroenterology)  CHIEF COMPLAINT: MDS, thrombocytopenia  INTERVAL HISTORY: Patient returns to clinic today for repeat laboratory work and further evaluation.  He has increased weakness and fatigue, but attributes this to his recent diagnosis of atrial fibrillation.  He otherwise feels well.  He has no neurologic complaints.  He denies any easy bleeding or bruising.  He has a good appetite and denies weight loss.  He has no chest pain, shortness of breath, or cough.  He has no nausea, vomiting, constipation, or diarrhea.  He has no urinary complaints.  Patient offers no further specific complaints today.  REVIEW OF SYSTEMS:   Review of Systems  Constitutional: Positive for malaise/fatigue. Negative for fever and weight loss.  Respiratory: Negative.  Negative for cough and shortness of breath.   Cardiovascular: Negative.  Negative for chest pain and leg swelling.  Gastrointestinal: Negative for abdominal pain, blood in stool, melena, nausea and vomiting.  Genitourinary: Negative.  Negative for hematuria.  Musculoskeletal: Negative.  Negative for back pain.  Skin: Negative.  Negative for rash.  Neurological: Positive for weakness. Negative for sensory change and focal weakness.  Endo/Heme/Allergies: Does not bruise/bleed easily.  Psychiatric/Behavioral: Negative.  The patient is not nervous/anxious and does not have insomnia.     As per HPI. Otherwise, a complete review of systems is negative.  PAST MEDICAL HISTORY: Past Medical History:  Diagnosis Date  . AAA (abdominal aortic aneurysm) ([place]) 1999   a. treated with open repair in 1999 with subsequent distal  anastomotic pseudoaneurysm that is being followed by Dr. Oneida Alar.  . Adenomatous colon polyp   . 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 Arthritis   . Basal cell cancer Dec. 24, 2013   Nose  . CAD (coronary artery disease)    a. s/p CABG 03/2018.  Marland Kitchen Cancer [facility]) Aug. 2013   prostate Seeding  implant  . Carotid artery occlusion   . Enlarged prostate 2008   Thermotherapy  . Gastroesophageal reflux disease   . GI bleed   . Hyperlipidemia   . Hypertension   . Mild aortic stenosis   . Myelodysplasia (myelodysplastic syndrome) ([place])   . Pancytopenia ([place])    sees Dr. Grayland Ormond  . Peripheral vascular disease ([place])   . Postoperative atrial fibrillation ([place])   . Psoriasis   . S/P CABG (coronary artery bypass graft)     PAST SURGICAL HISTORY: Past Surgical History:  Procedure Laterality Date  . ABDOMINAL AORTIC ANEURYSM REPAIR  1999  . CARDIAC CATHETERIZATION  10/2009  . CHOLECYSTECTOMY    . CORONARY ARTERY BYPASS GRAFT N/A 04/08/2018   Procedure: CORONARY ARTERY BYPASS GRAFTING (CABG) x four, using left mammary artery and right leg greater saphenous vein harvested endoscopically.;  Surgeon: Gaye Pollack, MD;  Location: [facility] OR;  Service: Open Heart Surgery;  Laterality: N/A;  . ESOPHAGOGASTRODUODENOSCOPY (EGD) WITH PROPOFOL N/A 06/26/2016   Procedure: ESOPHAGOGASTRODUODENOSCOPY (EGD) WITH PROPOFOL;  Surgeon: Lollie Sails, MD;  Location: [facility] ENDOSCOPY;  Service: Endoscopy;  Laterality: N/A;  . LEFT HEART CATH AND CORONARY ANGIOGRAPHY N/A 03/15/2018   Procedure: LEFT HEART CATH AND CORONARY ANGIOGRAPHY;  Surgeon: Jettie Booze, MD;  Location: [facility] CV LAB;  Service: Cardiovascular;  Laterality: N/A;  .  PROSTATE SURGERY    . SPINE SURGERY  2009   ruptured disc  . TEE WITHOUT CARDIOVERSION N/A 04/08/2018   Procedure: TRANSESOPHAGEAL ECHOCARDIOGRAM (TEE);  Surgeon: Gaye Pollack, MD;  Location: [facility];  Service: Open Heart Surgery;  Laterality: N/A;  . Visceral angiogram  05/14/2012    Right  groin area  . VISCERAL ANGIOGRAM N/A 05/14/2012   Procedure: VISCERAL ANGIOGRAM;  Surgeon: Elam Dutch, MD;  Location: [facility] CATH LAB;  Service: Cardiovascular;  Laterality: N/A;    FAMILY HISTORY Family History  Problem Relation Age 83 of Onset  . Heart attack Father   . Heart disease Father        After age 83  . COPD Father   . Cancer Sister        Breast and Brain  . Heart disease Brother        before age 66  . Heart attack Brother        ADVANCED DIRECTIVES:    HEALTH MAINTENANCE: Social History   Tobacco Use  . Smoking status: Former Smoker    Types: Cigarettes    Last attempt to quit: 04/29/1997    Years since quitting: 21.7  . Smokeless tobacco: Never Used  Substance Use Topics  . Alcohol use: No  . Drug use: No     Allergies  Allergen Reactions  . Ciprofloxacin Other (See Comments)    Abdominal Pain     Current Outpatient Medications  Medication Sig Dispense Refill  . amLODipine (NORVASC) 10 MG tablet Take 0.5 tablets (5 mg total) by mouth daily. 60 tablet 1  . Glucosamine-Chondroitin (COSAMIN DS PO) Take 2 tablets by mouth daily at 12 noon.    Marland Kitchen MAGNESIUM CITRATE PO Take 400 mg by mouth daily after supper.     . Melatonin 500 MCG TBDP Take 500 mcg by mouth at bedtime.    . Multiple Vitamin (MULTIVITAMIN WITH MINERALS) TABS tablet Take 1 tablet by mouth daily at 12 noon. Centrum Silver    . oxyCODONE-acetaminophen (PERCOCET/ROXICET) 5-325 MG tablet     . polyethylene glycol (MIRALAX / GLYCOLAX) packet Take 17 g by mouth daily. 14 each 0  . rosuvastatin (CRESTOR) 20 MG tablet Take 20 mg by mouth daily.    . valsartan-hydrochlorothiazide (DIOVAN HCT) 160-12.5 MG tablet Take 1 tablet by mouth daily. 30 tablet 1   No current facility-administered medications for this visit.     OBJECTIVE: Vitals:   02/03/19 1434  BP: (!) 141/68  Pulse: (!) 59  Temp: 97.9 F (36.6 C)     Body mass index is 23.35 kg/m.    ECOG FS:0 - Asymptomatic  General:  Well-developed, well-nourished, no acute distress. Eyes: Pink conjunctiva, anicteric sclera. HEENT: Normocephalic, moist mucous membranes. Lungs: Clear to auscultation bilaterally. Heart: Regular rate and rhythm. No rubs, murmurs, or gallops. Abdomen: Soft, nontender, nondistended. No organomegaly noted, normoactive bowel sounds. Musculoskeletal: No edema, cyanosis, or clubbing. Neuro: Alert, answering all questions appropriately. Cranial nerves grossly intact. Skin: No rashes or petechiae noted. Psych: Normal affect.  LAB RESULTS:  Lab Results  Component Value Date   NA 139 01/01/2019   K 3.7 01/01/2019   CL 105 01/01/2019   CO2 27 01/01/2019   GLUCOSE 118 (H) 01/01/2019   BUN 17 01/01/2019   CREATININE 0.95 01/01/2019   CALCIUM 9.2 01/01/2019   PROT 5.0 (L) 04/12/2018   ALBUMIN 3.0 (L) 04/12/2018   AST 29 04/12/2018   ALT 16 (L) 04/12/2018   ALKPHOS 41  04/12/2018   BILITOT 1.3 (H) 04/12/2018   GFRNONAA >60 01/01/2019   GFRAA >60 01/01/2019    Lab Results  Component Value Date   WBC 4.0 02/03/2019   NEUTROABS 1.8 02/03/2019   HGB 12.0 (L) 02/03/2019   HCT 37.1 (L) 02/03/2019   MCV 89.0 02/03/2019   PLT 45 (L) 02/03/2019     STUDIES: Vas [place] Aaa Duplex  Result Date: 01/18/2019 ABDOMINAL AORTA STUDY Indications: Follow up exam for known AAA. Risk Factors: Hypertension, hyperlipidemia, past history of smoking. Vascular Interventions: Open repair of AAA in 1999 with subsequent development                         of pseudo aneurysm.  Comparison Study: Prior duplex 01/28/2018 showed a maximum diameter of 2.4 cm.                   at the distal aorta and a thrombosed pseudoaneurysm measuring                   1.8 cm x 1.8 cm. Performing Technologist: Delorise Shiner RVT  Examination Guidelines: A complete evaluation includes B-mode imaging, spectral Doppler, color Doppler, and power Doppler as needed of all accessible portions of each vessel. Bilateral testing is considered an  integral part of a complete examination. Limited examinations for reoccurring indications may be performed as noted.  Abdominal Aorta Findings: +----------+-------+----------+----------+--------+--------+--------+ Location  AP (cm)Trans (cm)PSV (cm/s)WaveformThrombusComments +----------+-------+----------+----------+--------+--------+--------+ Proximal  2.94   3.00      88                                 +----------+-------+----------+----------+--------+--------+--------+ Mid       2.79   3.04      28                                 +----------+-------+----------+----------+--------+--------+--------+ Distal    1.88   1.82      31                                 +----------+-------+----------+----------+--------+--------+--------+ RT CIA Mid1.6    1.5       117                                +----------+-------+----------+----------+--------+--------+--------+ LT CIA Mid2.1    2.0       197                                +----------+-------+----------+----------+--------+--------+--------+  Thrombosed pseudoaneurysm seen at the distal aorta measuring 2.5 cm x 2.1 cm x 2.1 cm.  Summary: Abdominal Aorta: The largest aortic measurement is 3.0 cm. Stenosis: Thrombosed pseudoaneurysm is identified with no flow detected within. AAA measurements are slightly larger than previously recorded, however prior study was limited.  *See table(s) above for measurements and observations.  Electronically signed by Curt Jews MD on 01/18/2019 at 9:40:13 AM.   Final    Vas US Carotid  Result Date: 01/18/2019 Carotid Arterial Duplex Study Indications:       Carotid artery disease. Risk Factors:      Past history of smoking, coronary artery  disease. Comparison Study:  Prior duplex performed 01/22/2015 showed 40-59% ICA stenosis                    bilaterally Performing Technologist: Delorise Shiner RVT  Examination Guidelines: A complete evaluation includes B-mode imaging, spectral  Doppler, color Doppler, and power Doppler as needed of all accessible portions of each vessel. Bilateral testing is considered an integral part of a complete examination. Limited examinations for reoccurring indications may be performed as noted.  Right Carotid Findings: +----------+--------+--------+--------+--------------------------+--------+           PSV cm/sEDV cm/sStenosisDescribe                  Comments +----------+--------+--------+--------+--------------------------+--------+ CCA Prox  54      13                                                 +----------+--------+--------+--------+--------------------------+--------+ CCA Mid   67      18                                                 +----------+--------+--------+--------+--------------------------+--------+ CCA Distal88      20              smooth and homogeneous             +----------+--------+--------+--------+--------------------------+--------+ ICA Prox  165     48      40-59%  heterogenous and irregular         +----------+--------+--------+--------+--------------------------+--------+ ICA Mid   196     39              heterogenous and irregular         +----------+--------+--------+--------+--------------------------+--------+ ICA Distal166     37                                                 +----------+--------+--------+--------+--------------------------+--------+ ECA       91      0                                                  +----------+--------+--------+--------+--------------------------+--------+ +----------+--------+-------+----------------+-------------------+           PSV cm/sEDV cmsDescribe        Arm Pressure (mmHG) +----------+--------+-------+----------------+-------------------+ [id number]             Multiphasic, WNL                    +----------+--------+-------+----------------+-------------------+ +---------+--------+-+--------+------+  VertebralPSV cm/s0EDV cm/sAbsent +---------+--------+-+--------+------+  Left Carotid Findings: +----------+--------+--------+--------+--------------------------+--------+           PSV cm/sEDV cm/sStenosisDescribe                  Comments +----------+--------+--------+--------+--------------------------+--------+ CCA Prox  82      19              homogeneous and smooth             +----------+--------+--------+--------+--------------------------+--------+  CCA Mid   75      20              smooth and homogeneous             +----------+--------+--------+--------+--------------------------+--------+ CCA Distal109     19              calcific and irregular             +----------+--------+--------+--------+--------------------------+--------+ ICA Prox  88      16              irregular                          +----------+--------+--------+--------+--------------------------+--------+ ICA Mid   248     73      60-79%  irregular and heterogenous         +----------+--------+--------+--------+--------------------------+--------+ ICA Distal129     49                                                 +----------+--------+--------+--------+--------------------------+--------+ ECA       116     0                                                  +----------+--------+--------+--------+--------------------------+--------+ +----------+--------+--------+----------------+-------------------+ SubclavianPSV cm/sEDV cm/sDescribe        Arm Pressure (mmHG) +----------+--------+--------+----------------+-------------------+           66      0       Multiphasic, WNL                    +----------+--------+--------+----------------+-------------------+ +---------+--------+---+--------+--+---------+ VertebralPSV cm/s113EDV cm/s31Antegrade +---------+--------+---+--------+--+---------+  Summary: Right Carotid: Velocities in the right ICA are consistent with a  40-59%                stenosis. Left Carotid: Velocities in the left ICA are consistent with a 60-79% stenosis. Vertebrals:  Left vertebral artery demonstrates antegrade flow. Right vertebral              artery demonstrates no discernable flow. Subclavians: Normal flow hemodynamics were seen in bilateral subclavian              arteries. *See table(s) above for measurements and observations.  Electronically signed by Curt Jews MD on 01/18/2019 at 9:39:07 AM.    Final     ASSESSMENT: MDS, thrombocytopenia.  PLAN:    1. MDS: Confirmed by bone marrow biopsy. 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  Given his absolute increase in monocytes the possibility of an early/evolving CMML cannot be excluded. Cytogenics are normal 46XY. No increase in blast count.  Return to clinic in 3 months with repeat laboratory work and then in 6 months with repeat laboratory work and further evaluation.    2. 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  Patient's platelet count are 45 today which is approximately his baseline.  No intervention is needed at this time.  Previously prednisone worked temporarily, but patient had significant side effects with treatment.  If patient required additional treatments or interventions, he would require platelet transfusion at the time of the procedure.  Return  to clinic as above. 3.  Prostate cancer: Patient is status post seed implantation.  His last PSA on March 19, 2015 was reported as 0.03.  4.  Cardiac disease/atrial fibrillation: Continue follow-up and treatment per cardiac surgery. 5.  History of GI bleed: Continue monitoring by GI.  Patient has discontinued Aciphex. 6.  Leukopenia: Resolved. 7.  Anemia: Mild, monitor.   Patient expressed understanding and was in agreement with this plan. He also understands that He can call clinic at any time with any questions, concerns, or complaints.   Lloyd Huger, MD    02/04/2019 12:37 PM

## 2019-02-03 ENCOUNTER — Other Ambulatory Visit: Payer: Self-pay

## 2019-02-03 ENCOUNTER — Inpatient Hospital Stay: Payer: Medicare Other | Attending: Oncology

## 2019-02-03 ENCOUNTER — Inpatient Hospital Stay (HOSPITAL_BASED_OUTPATIENT_CLINIC_OR_DEPARTMENT_OTHER): Payer: Medicare Other | Admitting: Oncology

## 2019-02-03 VITALS — BP 141/68 | HR 59 | Temp 97.9°F | Ht 69.0 in | Wt 158.1 lb

## 2019-02-03 DIAGNOSIS — D469 Myelodysplastic syndrome, unspecified: Secondary | ICD-10-CM

## 2019-02-03 DIAGNOSIS — D6959 Other secondary thrombocytopenia: Secondary | ICD-10-CM | POA: Diagnosis not present

## 2019-02-03 DIAGNOSIS — Z79899 Other long term (current) drug therapy: Secondary | ICD-10-CM | POA: Diagnosis not present

## 2019-02-03 DIAGNOSIS — Z87891 Personal history of nicotine dependence: Secondary | ICD-10-CM | POA: Diagnosis not present

## 2019-02-03 DIAGNOSIS — I4891 Unspecified atrial fibrillation: Secondary | ICD-10-CM | POA: Diagnosis not present

## 2019-02-03 DIAGNOSIS — I10 Essential (primary) hypertension: Secondary | ICD-10-CM

## 2019-02-03 DIAGNOSIS — C61 Malignant neoplasm of prostate: Secondary | ICD-10-CM | POA: Diagnosis not present

## 2019-02-03 LAB — CBC WITH DIFFERENTIAL/PLATELET
Abs Immature Granulocytes: 0.01 10*3/uL (ref 0.00–0.07)
Basophils Absolute: 0 10*3/uL (ref 0.0–0.1)
Basophils Relative: 0 %
Eosinophils Absolute: 0.1 10*3/uL (ref 0.0–0.5)
Eosinophils Relative: 2 %
HCT: 37.1 % — ABNORMAL LOW (ref 39.0–52.0)
Hemoglobin: 12 g/dL — ABNORMAL LOW (ref 13.0–17.0)
Immature Granulocytes: 0 %
Lymphocytes Relative: 33 %
Lymphs Abs: 1.3 10*3/uL (ref 0.7–4.0)
MCH: 28.8 pg (ref 26.0–34.0)
MCHC: 32.3 g/dL (ref 30.0–36.0)
MCV: 89 fL (ref 80.0–100.0)
Monocytes Absolute: 0.8 10*3/uL (ref 0.1–1.0)
Monocytes Relative: 19 %
Neutro Abs: 1.8 10*3/uL (ref 1.7–7.7)
Neutrophils Relative %: 46 %
Platelets: 45 10*3/uL — ABNORMAL LOW (ref 150–400)
RBC: 4.17 MIL/uL — ABNORMAL LOW (ref 4.22–5.81)
RDW: 14.2 % (ref 11.5–15.5)
WBC: 4 10*3/uL (ref 4.0–10.5)
nRBC: 0 % (ref 0.0–0.2)

## 2019-02-03 NOTE — Progress Notes (Signed)
Patient is here today to follow up on his MDS. Patient stated that he had been doing well.

## 2019-02-11 ENCOUNTER — Other Ambulatory Visit (HOSPITAL_COMMUNITY): Payer: Medicare Other

## 2019-02-11 ENCOUNTER — Encounter (HOSPITAL_COMMUNITY): Payer: Medicare Other

## 2019-02-11 ENCOUNTER — Ambulatory Visit: Payer: Medicare Other | Admitting: Family

## 2019-05-03 ENCOUNTER — Telehealth: Payer: Self-pay | Admitting: *Deleted

## 2019-05-03 NOTE — Telephone Encounter (Signed)
Doing prescreening calls and patient reports that he can not come tomorrow for labs. He would like someone to call to reschedule his appts.

## 2019-05-04 ENCOUNTER — Inpatient Hospital Stay: Payer: Medicare Other

## 2019-07-12 ENCOUNTER — Other Ambulatory Visit: Payer: Self-pay

## 2019-07-12 DIAGNOSIS — I6529 Occlusion and stenosis of unspecified carotid artery: Secondary | ICD-10-CM

## 2019-07-19 ENCOUNTER — Ambulatory Visit (HOSPITAL_COMMUNITY)
Admission: RE | Admit: 2019-07-19 | Discharge: 2019-07-19 | Disposition: A | Discharge status: Home / Self Care | Payer: Medicare Other | Source: Ambulatory Visit | Attending: Family | Admitting: Family

## 2019-07-19 ENCOUNTER — Other Ambulatory Visit: Payer: Self-pay

## 2019-07-19 ENCOUNTER — Ambulatory Visit: Payer: Medicare Other | Admitting: Family

## 2019-07-19 DIAGNOSIS — I6529 Occlusion and stenosis of unspecified carotid artery: Secondary | ICD-10-CM | POA: Diagnosis present

## 2019-07-25 ENCOUNTER — Encounter: Payer: Self-pay | Admitting: Family

## 2019-07-25 ENCOUNTER — Other Ambulatory Visit: Payer: Self-pay

## 2019-07-25 ENCOUNTER — Ambulatory Visit (INDEPENDENT_AMBULATORY_CARE_PROVIDER_SITE_OTHER): Payer: Medicare Other | Admitting: Family

## 2019-07-25 VITALS — BP 125/64 | HR 74 | Ht 69.0 in | Wt 155.0 lb

## 2019-07-25 DIAGNOSIS — I714 Abdominal aortic aneurysm, without rupture, unspecified: Secondary | ICD-10-CM

## 2019-07-25 DIAGNOSIS — I6523 Occlusion and stenosis of bilateral carotid arteries: Secondary | ICD-10-CM | POA: Diagnosis not present

## 2019-07-25 DIAGNOSIS — Z9889 Other specified postprocedural states: Secondary | ICD-10-CM

## 2019-07-25 NOTE — Progress Notes (Signed)
Virtual Visit via Telephone Note   I connected with [name] on 07/25/2019 using the [employer] by telephone and verified that I was speaking with the correct person using two identifiers. Patient was located at his home and accompanied by himself. I am located at the [facility] office/clinic.   The limitations of evaluation and management by telemedicine and the availability of in person appointments have been previously discussed with the patient and are documented in the patients chart. The patient expressed understanding and consented to proceed.  PCP: [name], MD  Chief Complaint: Follow up anastomotic pseudoaneurysm and extracranial carotid artery stenosis   History of Present Illness: [name] is a 85 y.o. male whom Dr. Oneida Alar has monitored in the past for bilateral asymptomatic moderate carotid stenosis.  He also has a known aortic anastomotic pseudoaneurysm. He apparently had repair of the abdominal aortic aneurysm in 1999 at [facility]. He continues to deny any abdominal pain but does have chronic back pain. Previous CT scan in July 2017 showed a 15 mm pseudoaneurysm at the distal anastomosis with the remainder of the graft intact. 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 He is very active overall and continues to exercise frequently.  He had a recent CT scan of the abdomen and pelvis and these images were reviewed by Dr. Oneida Alar on 07-23-17. The component of the pseudoaneurysm was 16 mm in diameter.He continued to deny abdominal or back pain.  Dr. Oneida Alar last evaluated pt on 07-23-17. At that time patient's distal anastomotic pseudoaneurysm had been stable for one year. The pseudoaneurysm would be treatable percutaneously with a stent graft. However in the past we had considered this he was found to have a myelodysplasia with thrombocytopenia. Dr. Oneida Alar discussed with the patient that day again the possibility of  repair in the pseudoaneurysm. However it has been stable for one year. There would be some and risk involved with the procedure due to his thrombocytopenia. Although the risk of converting to an open repair would be very low it would be much higher risk in light of his thrombocytopenia. The patient was going to think about whether or not he wishes to proceed with repair. Dr. Oneida Alar certainly believes continued observation would be reasonable as well. The patient was scheduled for a repeatabdominalaortic ultrasound in 6 months time. He will call Dr. Oneida Alar if he wishes to have this repaired instead a sooner date.  Pt was last evaluated on 01-18-19 by [employer]. At that time carotid duplex showed that he left CEA has increased in velocity and stenosis, but remained < 80% and he was asymptomatic.  The right CEA was < 59% and unchanged from prior study.   Abdominal aortic duplex that day showed thrombosed pseudoaneurysm seen at the distal aorta measuring 2.5 cm x 2.1 cm x 2.1 cm.  Thrombosed pseudoaneurysm was identified with no flow detected within. It was determined that pt should have a repeat carotid duplex in 6 months due to the increase in left ICA stenosis noted on this most recent carotid duplex, and a CTA of the Abd/pelvis to examine the EVAR and distal pseudoaneurysm.   CTA of the Abd/pelvis was not done since 02-09-18. No abdominal vascular duplex was done sine 01-18-19.  He has a normal serum creatinine as of 01-12-19 at 0.95.  On review of encounter notes, he saw Dr. Juanetta Snow on 06-07-19. Dr. Levell July note " He had a recent CT scan of the  abdomen and pelvis and these images are reviewed today. The component of the pseudoaneurysm was 16 mm in diameter on my review. He continues to deny abdominal or back pain. " Review of [place] shows that the most recent CTA abd/pelvis was on 02-09-18.   Pt states he exercises daily for an hour.  He participates in the senior games.  He has chronic back  pain for which he takes oxycodone prescribed by Dr. Nelva Bush, his orthopod,  no new back pain, denies abdominal pain.   Dr. Grayland Ormond is his hematologist at [facility].    Diabetic: No  Tobacco use: former smoker, quit in 1998  Pt meds include: Statin :Yes Betablocker: No ASA: No, does not take due to low platelets  Other anticoagulants/antiplatelets: no    Past Medical History:  Diagnosis Date   AAA (abdominal aortic aneurysm) ([place]) 1999   a. treated with open repair in 1999 with subsequent distal anastomotic pseudoaneurysm that is being followed by Dr. Oneida Alar.   Adenomatous colon polyp    Aorta aneurysm ([place])    Arthritis    Basal cell cancer Dec. 24, 2013   Nose   CAD (coronary artery disease)    a. s/p CABG 03/2018.   Cancer [facility]) Aug. 2013   prostate Seeding  implant   Carotid artery occlusion    Enlarged prostate 2008   Thermotherapy   Gastroesophageal reflux disease    GI bleed    Hyperlipidemia    Hypertension    Mild aortic stenosis    Myelodysplasia (myelodysplastic syndrome) (HCC)    Pancytopenia (HCC)    sees Dr. Grayland Ormond   Peripheral vascular disease ([place])    Postoperative atrial fibrillation ([place])    Psoriasis    S/P CABG (coronary artery bypass graft)     Past Surgical History:  Procedure Laterality Date   ABDOMINAL AORTIC ANEURYSM REPAIR  1999   CARDIAC CATHETERIZATION  10/2009   CHOLECYSTECTOMY     CORONARY ARTERY BYPASS GRAFT N/A 04/08/2018   Procedure: CORONARY ARTERY BYPASS GRAFTING (CABG) x four, using left mammary artery and right leg greater saphenous vein harvested endoscopically.;  Surgeon: Gaye Pollack, MD;  [facility];  Service: Open Heart Surgery;  Laterality: N/A;   ESOPHAGOGASTRODUODENOSCOPY (EGD) WITH PROPOFOL N/A 06/26/2016   Procedure: ESOPHAGOGASTRODUODENOSCOPY (EGD) WITH PROPOFOL;  Surgeon: Lollie Sails, MD;  [facility] ENDOSCOPY;  Service: Endoscopy;  Laterality: N/A;   LEFT HEART CATH AND  CORONARY ANGIOGRAPHY N/A 03/15/2018   Procedure: LEFT HEART CATH AND CORONARY ANGIOGRAPHY;  Surgeon: Jettie Booze, MD;  Location: [place] CV LAB;  Service: Cardiovascular;  Laterality: N/A;   PROSTATE SURGERY     SPINE SURGERY  2009   ruptured disc   TEE WITHOUT CARDIOVERSION N/A 04/08/2018   Procedure: TRANSESOPHAGEAL ECHOCARDIOGRAM (TEE);  Surgeon: Gaye Pollack, MD;  Location: [place];  Service: Open Heart Surgery;  Laterality: N/A;   Visceral angiogram  05/14/2012   Right  groin area   VISCERAL ANGIOGRAM N/A 05/14/2012   Procedure: VISCERAL ANGIOGRAM;  Surgeon: Elam Dutch, MD;  Location: [facility] CATH LAB;  Service: Cardiovascular;  Laterality: N/A;    Current Meds  Medication Sig   amLODipine (NORVASC) 10 MG tablet Take 0.5 tablets (5 mg total) by mouth daily.   Glucosamine-Chondroitin (COSAMIN DS PO) Take 2 tablets by mouth daily at 12 noon.   MAGNESIUM CITRATE PO Take 400 mg by mouth daily after supper.    Melatonin 500 MCG TBDP Take 500 mcg by mouth  at bedtime.   Multiple Vitamin (MULTIVITAMIN WITH MINERALS) TABS tablet Take 1 tablet by mouth daily at 12 noon. Centrum Silver   polyethylene glycol (MIRALAX / GLYCOLAX) packet Take 17 g by mouth daily.   rosuvastatin (CRESTOR) 20 MG tablet Take 20 mg by mouth daily.   valsartan-hydrochlorothiazide (DIOVAN HCT) 160-12.5 MG tablet Take 1 tablet by mouth daily.    12 system ROS was negative unless otherwise noted in HPI   Observations/Objective:  DATA  Carotid Duplex (07-19-19): Significant plaque in the mid to proximal CCA Summary: Right Carotid: Velocities in the right ICA are consistent with a 40-59%                stenosis. Left Carotid: Velocities in the left proximal to mid ICA are consistent with a               60-79% stenosis. Vertebrals:  Bilateral vertebral arteries demonstrate antegrade flow. Subclavians: Normal flow hemodynamics were seen in bilateral subclavian arteries. No change compared  to the exam on 01-18-19.  Physical Examination Vitals:   07/25/19 1017  BP: 125/64  Pulse: 74  Weight: 155 lb (70.3 kg)  Height: 5\' 9"  (1.753 m)   Body mass index is 22.89 kg/m.     Assessment and Plan: [name] is a 85 y.o. male whom we are monitoring for bilateral asymptomatic moderate carotid stenosis. He also has a known aortic anastomotic pseudoaneurysm s/p repair of the abdominal aortic aneurysm in 1999 at [facility].  Carotid duplex on 07-19-19 shows 40-59% right ICA stenosis and 60-79% left ICA stenosis, no change compared to 01-18-19.    Since CTA abd/pelvis has not been done since 02-09-18 (this is the most recent CTA abd/pelvis in [place]), was requested to be done about 6 months after his 01-18-19 visit, and pt did not decline any imaging, will again request that pt be scheduled for CTA abd/pelvis in about a month, follow up with Dr. Trula Slade after this.    He feels well, exercises an hour daily.   Follow Up Instructions:   Since CTA abd/pelvis has not been done since 02-09-18 (this is the most recent CTA abd/pelvis in [place]), was requested to be done about 6 months after his 01-18-19 visit, and pt did not decline any imaging, will again request that pt be scheduled for CTA abd/pelvis in about a month, follow up with Dr. Trula Slade after this. Follow up in 6 months with carotid duplex.     I discussed the assessment and treatment plan with the patient. The patient was provided an opportunity to ask questions and all were answered. The patient agreed with the plan and demonstrated an understanding of the instructions.   The patient was advised to call back or seek an in-person evaluation if the symptoms worsen or if the condition fails to improve as anticipated.  I spent 14 minutes with the patient via telephone encounter.   Gabrielle Dare Zackery Brine [facility] Office: [phone]

## 2019-07-25 NOTE — Patient Instructions (Signed)

## 2019-08-04 NOTE — Progress Notes (Signed)
[place]  [phone] [phone]  ID: Hale Drone OB: 1926-10-12  MR#: [id number]  AOZ#:[id number]  Patient Care Team: [name], MD as PCP - General (Family Medicine) Jettie Booze, MD as PCP - Cardiology (Cardiology) Lollie Sails, MD as Consulting Physician (Gastroenterology)  CHIEF COMPLAINT: MDS, thrombocytopenia  INTERVAL HISTORY: Patient returns to clinic today for repeat laboratory work and further evaluation.  He has noted some increased petechiae and bruising on his lower extremities.  He also has a rash on his torso that is being evaluated and treated by dermatology.  He otherwise feels well and is asymptomatic. He has no neurologic complaints. He has a good appetite and denies weight loss.  He denies any chest pain, shortness of breath, cough, or hemoptysis.  He has no nausea, vomiting, constipation, or diarrhea.  He has no urinary complaints.  Patient offers no further specific complaints today.  REVIEW OF SYSTEMS:   Review of Systems  Constitutional: Negative.  Negative for fever, malaise/fatigue and weight loss.  Respiratory: Negative.  Negative for cough and shortness of breath.   Cardiovascular: Negative.  Negative for chest pain and leg swelling.  Gastrointestinal: Negative for abdominal pain, blood in stool, melena, nausea and vomiting.  Genitourinary: Negative.  Negative for hematuria.  Musculoskeletal: Negative.  Negative for back pain.  Skin: Negative.  Negative for rash.  Neurological: Negative.  Negative for dizziness, sensory change, focal weakness, weakness and headaches.  Endo/Heme/Allergies: Bruises/bleeds easily.  Psychiatric/Behavioral: Negative.  The patient is not nervous/anxious and does not have insomnia.     As per HPI. Otherwise, a complete review of systems is negative.  PAST MEDICAL HISTORY: Past Medical History:  Diagnosis Date  . AAA (abdominal aortic aneurysm) ([place]) 1999   a.  treated with open repair in 1999 with subsequent distal anastomotic pseudoaneurysm that is being followed by Dr. Oneida Alar.  . Adenomatous colon polyp   . Aorta aneurysm ([place])   . Arthritis   . Basal cell cancer Dec. 24, 2013   Nose  . CAD (coronary artery disease)    a. s/p CABG 03/2018.  Marland Kitchen Cancer [facility]) Aug. 2013   prostate Seeding  implant  . Carotid artery occlusion   . Enlarged prostate 2008   Thermotherapy  . Gastroesophageal reflux disease   . GI bleed   . Hyperlipidemia   . Hypertension   . Mild aortic stenosis   . Myelodysplasia (myelodysplastic syndrome) ([place])   . Pancytopenia ([place])    sees Dr. Grayland Ormond  . Peripheral vascular disease ([place])   . Postoperative atrial fibrillation ([place])   . Psoriasis   . S/P CABG (coronary artery bypass graft)     PAST SURGICAL HISTORY: Past Surgical History:  Procedure Laterality Date  . ABDOMINAL AORTIC ANEURYSM REPAIR  1999  . CARDIAC CATHETERIZATION  10/2009  . CHOLECYSTECTOMY    . CORONARY ARTERY BYPASS GRAFT N/A 04/08/2018   Procedure: CORONARY ARTERY BYPASS GRAFTING (CABG) x four, using left mammary artery and right leg greater saphenous vein harvested endoscopically.;  Surgeon: Gaye Pollack, MD;  [facility];  Service: Open Heart Surgery;  Laterality: N/A;  . ESOPHAGOGASTRODUODENOSCOPY (EGD) WITH PROPOFOL N/A 06/26/2016   Procedure: ESOPHAGOGASTRODUODENOSCOPY (EGD) WITH PROPOFOL;  Surgeon: Lollie Sails, MD;  Location[facility] ENDOSCOPY;  Service: Endoscopy;  Laterality: N/A;  . LEFT HEART CATH AND CORONARY ANGIOGRAPHY N/A 03/15/2018   Procedure: LEFT HEART CATH AND CORONARY ANGIOGRAPHY;  Surgeon: Jettie Booze, MD;  Location: [place] CV LAB;  Service: Cardiovascular;  Laterality: N/A;  . PROSTATE SURGERY    . SPINE SURGERY  2009   ruptured disc  . TEE WITHOUT CARDIOVERSION N/A 04/08/2018   Procedure: TRANSESOPHAGEAL ECHOCARDIOGRAM (TEE);  Surgeon: Gaye Pollack, MD;  Location: [place];  Service: Open Heart Surgery;   Laterality: N/A;  . Visceral angiogram  05/14/2012   Right  groin area  . VISCERAL ANGIOGRAM N/A 05/14/2012   Procedure: VISCERAL ANGIOGRAM;  Surgeon: Elam Dutch, MD;  Location: [facility] CATH LAB;  Service: Cardiovascular;  Laterality: N/A;    FAMILY HISTORY Family History  Problem Relation Age of Onset  . Heart attack Father   . Heart disease Father        After age 28  . COPD Father   . Cancer Sister        Breast and Brain  . Heart disease Brother        before age 28  . Heart attack Brother        ADVANCED DIRECTIVES:    HEALTH MAINTENANCE: Social History   Tobacco Use  . Smoking status: Former Smoker    Types: Cigarettes    Quit date: 04/29/1997    Years since quitting: 22.2  . Smokeless tobacco: Never Used  Substance Use Topics  . Alcohol use: No  . Drug use: No     Allergies  Allergen Reactions  . Ciprofloxacin Other (See Comments)    Abdominal Pain     Current Outpatient Medications  Medication Sig Dispense Refill  . amLODipine (NORVASC) 10 MG tablet Take 0.5 tablets (5 mg total) by mouth daily. 60 tablet 1  . Glucosamine-Chondroitin (COSAMIN DS PO) Take 2 tablets by mouth daily at 12 noon.    Marland Kitchen MAGNESIUM CITRATE PO Take 400 mg by mouth daily after supper.     . Melatonin 500 MCG TBDP Take 500 mcg by mouth at bedtime.    . Multiple Vitamin (MULTIVITAMIN WITH MINERALS) TABS tablet Take 1 tablet by mouth daily at 12 noon. Centrum Silver    . oxyCODONE-acetaminophen (PERCOCET/ROXICET) 5-325 MG tablet Take by mouth.    . polyethylene glycol (MIRALAX / GLYCOLAX) packet Take 17 g by mouth daily. 14 each 0  . rosuvastatin (CRESTOR) 20 MG tablet Take 20 mg by mouth daily.    . valsartan-hydrochlorothiazide (DIOVAN HCT) 160-12.5 MG tablet Take 1 tablet by mouth daily. 30 tablet 1  . valsartan-hydrochlorothiazide (DIOVAN-HCT) 160-12.5 MG tablet Take by mouth.     No current facility-administered medications for this visit.     OBJECTIVE: Vitals:   08/09/19  1353  BP: 115/71  Temp: 98.1 F (36.7 C)     Body mass index is 22.89 kg/m.    ECOG FS:0 - Asymptomatic  General: Well-developed, well-nourished, no acute distress. Eyes: Pink conjunctiva, anicteric sclera. HEENT: Normocephalic, moist mucous membranes. Lungs: Clear to auscultation bilaterally. Heart: Regular rate and rhythm. No rubs, murmurs, or gallops. Abdomen: Soft, nontender, nondistended. No organomegaly noted, normoactive bowel sounds. Musculoskeletal: No edema, cyanosis, or clubbing. Neuro: Alert, answering all questions appropriately. Cranial nerves grossly intact. Skin: Petechiae noted on bilateral lower extremity. Psych: Normal affect.  LAB RESULTS:  Lab Results  Component Value Date   NA 139 01/01/2019   K 3.7 01/01/2019   CL 105 01/01/2019   CO2 27 01/01/2019   GLUCOSE 118 (H) 01/01/2019   BUN 17 01/01/2019   CREATININE 0.95 01/01/2019   CALCIUM 9.2 01/01/2019   PROT 5.0 (L) 04/12/2018   ALBUMIN 3.0 (L) 04/12/2018  AST 29 04/12/2018   ALT 16 (L) 04/12/2018   ALKPHOS 41 04/12/2018   BILITOT 1.3 (H) 04/12/2018   GFRNONAA >60 01/01/2019   GFRAA >60 01/01/2019    Lab Results  Component Value Date   WBC 4.1 08/09/2019   NEUTROABS 2.1 08/09/2019   HGB 11.7 (L) 08/09/2019   HCT 35.8 (L) 08/09/2019   MCV 88.6 08/09/2019   PLT 37 (L) 08/09/2019     STUDIES: Vas US Carotid  Result Date: 07/19/2019 Carotid Arterial Duplex Study Indications:  Carotid artery disease. Risk Factors: Past history of smoking. Performing Technologist: Alvia Grove RVT  Examination Guidelines: A complete evaluation includes B-mode imaging, spectral Doppler, color Doppler, and power Doppler as needed of all accessible portions of each vessel. Bilateral testing is considered an integral part of a complete examination. Limited examinations for reoccurring indications may be performed as noted.  Right Carotid Findings: +----------+--------+--------+--------+------------+--------+            PSV cm/sEDV cm/sStenosisDescribe    Comments +----------+--------+--------+--------+------------+--------+ CCA Prox  65      11                                   +----------+--------+--------+--------+------------+--------+ CCA Mid   70      19              heterogenous         +----------+--------+--------+--------+------------+--------+ CCA Distal67      15              heterogenous         +----------+--------+--------+--------+------------+--------+ ICA Prox  155     49      40-59%  heterogenous         +----------+--------+--------+--------+------------+--------+ ICA Mid   106     25                                   +----------+--------+--------+--------+------------+--------+ ICA Distal94      21                                   +----------+--------+--------+--------+------------+--------+ ECA       85      9               heterogenous         +----------+--------+--------+--------+------------+--------+ +----------+--------+-------+----------------+-------------------+           PSV cm/sEDV cmsDescribe        Arm Pressure (mmHG) +----------+--------+-------+----------------+-------------------+ [id number]     3      Multiphasic, WNL                    +----------+--------+-------+----------------+-------------------+ +---------+--------+--+--------+--+---------+ VertebralPSV cm/s59EDV cm/s12Antegrade +---------+--------+--+--------+--+---------+  Left Carotid Findings: +----------+--------+--------+--------+------------+--------+           PSV cm/sEDV cm/sStenosisDescribe    Comments +----------+--------+--------+--------+------------+--------+ CCA Prox  59      14              calcific             +----------+--------+--------+--------+------------+--------+ CCA Mid   79      19              heterogenous         +----------+--------+--------+--------+------------+--------+ CCA Distal99  20                                    +----------+--------+--------+--------+------------+--------+ ICA Prox  220     71      60-79%  heterogenous         +----------+--------+--------+--------+------------+--------+ ICA Mid   198     59      60-79%                       +----------+--------+--------+--------+------------+--------+ ICA Distal82      19                                   +----------+--------+--------+--------+------------+--------+ ECA       110     14              heterogenous         +----------+--------+--------+--------+------------+--------+ +----------+--------+--------+----------------+-------------------+ SubclavianPSV cm/sEDV cm/sDescribe        Arm Pressure (mmHG) +----------+--------+--------+----------------+-------------------+           102     0       Multiphasic, WNL                    +----------+--------+--------+----------------+-------------------+ +---------+--------+---+--------+--+---------+ VertebralPSV cm/s108EDV cm/s27Antegrade +---------+--------+---+--------+--+---------+ Significant plaque in the mid to proximal CCA  Summary: Right Carotid: Velocities in the right ICA are consistent with a 40-59%                stenosis. Left Carotid: Velocities in the left proximal to mid ICA are consistent with a               60-79% stenosis. Vertebrals:  Bilateral vertebral arteries demonstrate antegrade flow. Subclavians: Normal flow hemodynamics were seen in bilateral subclavian              arteries. *See table(s) above for measurements and observations.  Electronically signed by Curt Jews MD on 07/19/2019 at 3:30:01 PM.    Final     ASSESSMENT: MDS, thrombocytopenia.  PLAN:    1. MDS: Confirmed by bone marrow biopsy. 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  Given his absolute increase in monocytes the possibility of an early/evolving CMML cannot be excluded. Cytogenics are  normal 46XY. No increase in blast count.  Return to clinic in 3 months with repeat laboratory work and then in 6 months with repeat laboratory work and further evaluation.    2. 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  Patient's platelet count is decreased to 37 today which is slightly below his baseline.  His platelet count has ranged between 37 and 60 since April 2019.  Upon further review of the chart patient's platelet count has not been above 100 since February 2013.  No intervention is needed at this time, but if patient's platelet count continues to trend down would likely try Nplate.  Patient had prednisone before, but discontinued secondary to significant side effects. 3.  Prostate cancer: Patient is status post seed implantation.  His last PSA on March 19, 2015 was reported as 0.03.  4.  Cardiac disease/atrial fibrillation: Continue follow-up and treatment per cardiac surgery. 5.  History of GI bleed: Continue monitoring by GI.  Patient has discontinued Aciphex. 6.  Leukopenia: Resolved. 7.  Anemia: Mild, monitor.   Patient expressed understanding  and was in agreement with this plan. He also understands that He can call clinic at any time with any questions, concerns, or complaints.   Lloyd Huger, MD   08/09/2019 2:57 PM

## 2019-08-05 ENCOUNTER — Other Ambulatory Visit: Payer: Self-pay | Admitting: *Deleted

## 2019-08-05 DIAGNOSIS — D696 Thrombocytopenia, unspecified: Secondary | ICD-10-CM

## 2019-08-08 ENCOUNTER — Other Ambulatory Visit: Payer: Self-pay

## 2019-08-09 ENCOUNTER — Other Ambulatory Visit: Payer: Self-pay

## 2019-08-09 ENCOUNTER — Encounter: Payer: Self-pay | Admitting: Oncology

## 2019-08-09 ENCOUNTER — Inpatient Hospital Stay (HOSPITAL_BASED_OUTPATIENT_CLINIC_OR_DEPARTMENT_OTHER): Payer: Medicare Other | Admitting: Oncology

## 2019-08-09 ENCOUNTER — Inpatient Hospital Stay: Payer: Medicare Other | Attending: Oncology

## 2019-08-09 VITALS — BP 115/71 | Temp 98.1°F | Wt 155.0 lb

## 2019-08-09 DIAGNOSIS — C61 Malignant neoplasm of prostate: Secondary | ICD-10-CM | POA: Diagnosis not present

## 2019-08-09 DIAGNOSIS — D6959 Other secondary thrombocytopenia: Secondary | ICD-10-CM | POA: Insufficient documentation

## 2019-08-09 DIAGNOSIS — D649 Anemia, unspecified: Secondary | ICD-10-CM | POA: Insufficient documentation

## 2019-08-09 DIAGNOSIS — D469 Myelodysplastic syndrome, unspecified: Secondary | ICD-10-CM | POA: Diagnosis present

## 2019-08-09 DIAGNOSIS — E785 Hyperlipidemia, unspecified: Secondary | ICD-10-CM | POA: Insufficient documentation

## 2019-08-09 DIAGNOSIS — I4891 Unspecified atrial fibrillation: Secondary | ICD-10-CM | POA: Diagnosis not present

## 2019-08-09 DIAGNOSIS — Z87891 Personal history of nicotine dependence: Secondary | ICD-10-CM | POA: Insufficient documentation

## 2019-08-09 DIAGNOSIS — Z79899 Other long term (current) drug therapy: Secondary | ICD-10-CM | POA: Insufficient documentation

## 2019-08-09 DIAGNOSIS — I10 Essential (primary) hypertension: Secondary | ICD-10-CM | POA: Insufficient documentation

## 2019-08-09 DIAGNOSIS — D696 Thrombocytopenia, unspecified: Secondary | ICD-10-CM

## 2019-08-09 LAB — CBC WITH DIFFERENTIAL/PLATELET
Abs Immature Granulocytes: 0.01 10*3/uL (ref 0.00–0.07)
Basophils Absolute: 0 10*3/uL (ref 0.0–0.1)
Basophils Relative: 1 %
Eosinophils Absolute: 0.1 10*3/uL (ref 0.0–0.5)
Eosinophils Relative: 3 %
HCT: 35.8 % — ABNORMAL LOW (ref 39.0–52.0)
Hemoglobin: 11.7 g/dL — ABNORMAL LOW (ref 13.0–17.0)
Immature Granulocytes: 0 %
Lymphocytes Relative: 24 %
Lymphs Abs: 1 10*3/uL (ref 0.7–4.0)
MCH: 29 pg (ref 26.0–34.0)
MCHC: 32.7 g/dL (ref 30.0–36.0)
MCV: 88.6 fL (ref 80.0–100.0)
Monocytes Absolute: 0.9 10*3/uL (ref 0.1–1.0)
Monocytes Relative: 22 %
Neutro Abs: 2.1 10*3/uL (ref 1.7–7.7)
Neutrophils Relative %: 50 %
Platelets: 37 10*3/uL — ABNORMAL LOW (ref 150–400)
RBC: 4.04 MIL/uL — ABNORMAL LOW (ref 4.22–5.81)
RDW: 13.2 % (ref 11.5–15.5)
WBC: 4.1 10*3/uL (ref 4.0–10.5)
nRBC: 0 % (ref 0.0–0.2)

## 2019-08-09 NOTE — Progress Notes (Signed)
Pt offers no concerns today.

## 2019-08-10 ENCOUNTER — Other Ambulatory Visit: Payer: Self-pay | Admitting: Vascular Surgery

## 2019-08-10 DIAGNOSIS — I714 Abdominal aortic aneurysm, without rupture, unspecified: Secondary | ICD-10-CM

## 2019-08-10 DIAGNOSIS — I6523 Occlusion and stenosis of bilateral carotid arteries: Secondary | ICD-10-CM

## 2019-08-10 DIAGNOSIS — Z9889 Other specified postprocedural states: Secondary | ICD-10-CM

## 2019-08-11 ENCOUNTER — Other Ambulatory Visit: Payer: Self-pay | Admitting: Vascular Surgery

## 2019-08-11 DIAGNOSIS — I714 Abdominal aortic aneurysm, without rupture, unspecified: Secondary | ICD-10-CM

## 2019-08-11 DIAGNOSIS — I6523 Occlusion and stenosis of bilateral carotid arteries: Secondary | ICD-10-CM

## 2019-08-11 DIAGNOSIS — I2510 Atherosclerotic heart disease of native coronary artery without angina pectoris: Secondary | ICD-10-CM

## 2019-08-18 ENCOUNTER — Ambulatory Visit
Admission: RE | Admit: 2019-08-18 | Discharge: 2019-08-18 | Disposition: A | Discharge status: Home / Self Care | Payer: Medicare Other | Source: Ambulatory Visit | Attending: Vascular Surgery | Admitting: Vascular Surgery

## 2019-08-18 ENCOUNTER — Other Ambulatory Visit: Payer: Self-pay

## 2019-08-18 DIAGNOSIS — I2510 Atherosclerotic heart disease of native coronary artery without angina pectoris: Secondary | ICD-10-CM

## 2019-08-18 DIAGNOSIS — I714 Abdominal aortic aneurysm, without rupture, unspecified: Secondary | ICD-10-CM

## 2019-08-18 DIAGNOSIS — I6523 Occlusion and stenosis of bilateral carotid arteries: Secondary | ICD-10-CM

## 2019-08-18 LAB — POCT I-STAT CREATININE: Creatinine, Ser: 0.9 mg/dL (ref 0.61–1.24)

## 2019-08-18 MED ORDER — IOPAMIDOL (ISOVUE-370) INJECTION 76%
85.0000 mL | Freq: Once | INTRAVENOUS | Status: AC | PRN
  Administered 2019-08-18: 16:00:00 85 mL via INTRAVENOUS

## 2019-08-30 ENCOUNTER — Other Ambulatory Visit: Payer: Medicare Other

## 2019-09-01 ENCOUNTER — Other Ambulatory Visit: Payer: Self-pay | Admitting: *Deleted

## 2019-09-01 ENCOUNTER — Ambulatory Visit (INDEPENDENT_AMBULATORY_CARE_PROVIDER_SITE_OTHER): Payer: Medicare Other | Admitting: Vascular Surgery

## 2019-09-01 ENCOUNTER — Other Ambulatory Visit: Payer: Self-pay

## 2019-09-01 ENCOUNTER — Encounter: Payer: Self-pay | Admitting: Vascular Surgery

## 2019-09-01 VITALS — BP 119/71

## 2019-09-01 DIAGNOSIS — I714 Abdominal aortic aneurysm, without rupture, unspecified: Secondary | ICD-10-CM

## 2019-09-01 NOTE — Progress Notes (Signed)
Virtual Visit via Video Note   I connected with [name] on 09/01/2019 using the phone platform and verified that I was speaking with the correct person using two identifiers. Patient was located at home and accompanied by his son. I am located at our office at [facility].  A virtual video visit was not conducted due to technical limitations.   The limitations of evaluation and management by telemedicine and the availability of in person appointments have been previously discussed with the patient and are documented in the patients chart. The patient expressed understanding and consented to proceed.  History of Present Illness: Patient is a 85 year old male that we have been following in the past for bilateral asymptomatic moderate carotid stenosis.  He also has a known aorto anastomotic pseudoaneurysm.  He apparently had repair of an abdominal aortic aneurysm in 1999 at [facility].  His CT scan in July 2017 showed a 15 mm pseudoaneurysm at the distal anastomosis with the remainder of the graft intact.  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  His last carotid duplex was in July 2020.  This showed a 40 to 60% right internal carotid artery stenosis.  There was a 60 to 80% left internal carotid artery stenosis.  Past Medical History:  Diagnosis Date  . AAA (abdominal aortic aneurysm) ([place]) 1999   a. treated with open repair in 1999 with subsequent distal anastomotic pseudoaneurysm that is being followed by Dr. Oneida Alar.  . Adenomatous colon polyp   . Aorta aneurysm ([place])   . Arthritis   . Basal cell cancer Dec. 24, 2013   Nose  . CAD (coronary artery disease)    a. s/p CABG 03/2018.  [facility]) Aug. 2013   prostate Seeding  implant  . Carotid artery occlusion   . Enlarged prostate 2008   Thermotherapy  . Gastroesophageal reflux disease   . GI bleed   . Hyperlipidemia   . Hypertension   . Mild aortic stenosis   . Myelodysplasia (myelodysplastic  syndrome) ([place])   . Pancytopenia ([place])    sees Dr. Grayland Ormond  . Peripheral vascular disease ([place])   . Postoperative atrial fibrillation ([place])   . Psoriasis   . S/P CABG (coronary artery bypass graft)     Past Surgical History:  Procedure Laterality Date  . ABDOMINAL AORTIC ANEURYSM REPAIR  1999  . CARDIAC CATHETERIZATION  10/2009  . CHOLECYSTECTOMY    . CORONARY ARTERY BYPASS GRAFT N/A 04/08/2018   Procedure: CORONARY ARTERY BYPASS GRAFTING (CABG) x four, using left mammary artery and right leg greater saphenous vein harvested endoscopically.;  Surgeon: Gaye Pollack, MD;  Location: [facility] OR;  Service: Open Heart Surgery;  Laterality: N/A;  . ESOPHAGOGASTRODUODENOSCOPY (EGD) WITH PROPOFOL N/A 06/26/2016   Procedure: ESOPHAGOGASTRODUODENOSCOPY (EGD) WITH PROPOFOL;  Surgeon: Lollie Sails, MD;  Location[facility] ENDOSCOPY;  Service: Endoscopy;  Laterality: N/A;  . LEFT HEART CATH AND CORONARY ANGIOGRAPHY N/A 03/15/2018   Procedure: LEFT HEART CATH AND CORONARY ANGIOGRAPHY;  Surgeon: Jettie Booze, MD;  Location: [place] CV LAB;  Service: Cardiovascular;  Laterality: N/A;  . PROSTATE SURGERY    . SPINE SURGERY  2009   ruptured disc  . TEE WITHOUT CARDIOVERSION N/A 04/08/2018   Procedure: TRANSESOPHAGEAL ECHOCARDIOGRAM (TEE);  Surgeon: Gaye Pollack, MD;  Location: [place];  Service: Open Heart Surgery;  Laterality: N/A;  . Visceral angiogram  05/14/2012   Right  groin area  . VISCERAL ANGIOGRAM N/A 05/14/2012   Procedure:  VISCERAL ANGIOGRAM;  Surgeon: Elam Dutch, MD;  Location: [facility] CATH LAB;  Service: Cardiovascular;  Laterality: N/A;    Current Outpatient Medications on File Prior to Visit  Medication Sig Dispense Refill  . amLODipine (NORVASC) 10 MG tablet Take 0.5 tablets (5 mg total) by mouth daily. 60 tablet 1  . Glucosamine-Chondroitin (COSAMIN DS PO) Take 2 tablets by mouth daily at 12 noon.    Marland Kitchen MAGNESIUM CITRATE PO Take 400 mg by mouth daily after supper.     .  Melatonin 500 MCG TBDP Take 500 mcg by mouth at bedtime.    . Multiple Vitamin (MULTIVITAMIN WITH MINERALS) TABS tablet Take 1 tablet by mouth daily at 12 noon. Centrum Silver    . polyethylene glycol (MIRALAX / GLYCOLAX) packet Take 17 g by mouth daily. 14 each 0  . rosuvastatin (CRESTOR) 20 MG tablet Take 20 mg by mouth daily.    . valsartan-hydrochlorothiazide (DIOVAN HCT) 160-12.5 MG tablet Take 1 tablet by mouth daily. 30 tablet 1  . valsartan-hydrochlorothiazide (DIOVAN-HCT) 160-12.5 MG tablet Take by mouth.     No current facility-administered medications on file prior to visit.    Review of systems: He has no fever or chills.  He has chronic back pain which is unchanged.  He has no shortness of breath.  He has no chest pain.  He does require a couple of oxycodone tablets per day to control his back pain.  He has chronic thrombocytopenia with recent platelet count in the 30s.  Observations/Objective:   Data: I reviewed the images of the patient's CT scan of the abdomen and pelvis dated August 18, 2019.  The pseudoaneurysm is now 3 cm in diameter.  The native aorta is essentially normal.  Iliac and common femoral arteries are about 11 mm diameter.   Assessment and Plan: Distal anastomotic pseudoaneurysm from prior abdominal aortic aneurysm repair now 3 cm in diameter.  This is expanding over time.  I believe it is at risk of rupture.  I had a lengthy discussion with the patient and his son today as far as risk benefits possible complications and procedure details with aortic stent grafting to repair this.  I do believe the fact that that is continuing to grow is at risk of rupture if we do not repair this.  I believe overall the operation with a stent graft would be fairly low morbidity and mortality despite his advanced age.  I certainly believe that the risk of the operation is much lower than risk of rupture of the pseudoaneurysm.  He has no fever chills or malaise to suggest that this  is an infection.  Follow Up Instructions:   I discussed the assessment and treatment plan with the patient. The patient was provided an opportunity to ask questions and all were answered. The patient agreed with the plan and demonstrated an understanding of the instructions.   I spent 10 minutes with the patient via telephone encounter.  I spent an additional 15 minutes making measurements from the patient's recent CT scan prior to the telephone encounter.  The patient has decided to proceed with aortic endovascular stent graft repair of the anastomotic pseudoaneurysm.  We will determine an operative date for him.  We will also contact Dr. Gary Fleet office at [phone] to ask for their assistance with platelet management.  Patient states his most recent platelet count was in the 30s.   Signed, [name] [facility] Office: [phone]  09/01/2019, 12:29 PM

## 2019-09-02 ENCOUNTER — Other Ambulatory Visit: Payer: Self-pay | Admitting: Oncology

## 2019-09-04 NOTE — Progress Notes (Signed)
Cardiology Office Note   Date:  09/05/2019   ID:  [name], DOB December 13, 1926, MRN [id number]  PCP:  [name], MD    No chief complaint on file.  CAD  Wt Readings from Last 3 Encounters:  09/05/19 155 lb (70.3 kg)  08/09/19 155 lb (70.3 kg)  07/25/19 155 lb (70.3 kg)       History of Present Illness: [name] is a 85 y.o. male  Who has had CABG.  In the past, He took benzonate for a cough.  He noticed a slower HR, to the 40s, and he had some mild lightheadedness and low BP readings.  He stopped the medicine afte ra few ays and his BP improved but HR stayed low.   Prior to this, he has been feeling well.  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  Plan from Jan 2020: " I think some of this may be PACs causing his heart rate to be under counted.  Will check 24-hour Holter monitor.  Mild lightheadedness, but this seems rather nonspecific.  He has not had syncope.  I think he will be fine to continue to train and participate in his senior games."  Since [place], he has been trying to stay out of crowds.  He is supposed to have surgery and needs cardiac clearance for endovascular stent graft with Dr. Oneida Alar.   He feels that his pulse rate drops into the 40s, first thing in the morning.  Denies : exertional Chest pain. Dizziness. Leg edema. Nitroglycerin use. Orthopnea. Palpitations. Paroxysmal nocturnal dyspnea. Shortness of breath. Syncope.    He continues to exercise and play golf.      Past Medical History:  Diagnosis Date  . AAA (abdominal aortic aneurysm) ([place]) 1999   a. treated with open repair in 1999 with subsequent distal anastomotic pseudoaneurysm that is being followed by Dr. Oneida Alar.  . Adenomatous colon polyp   . Aorta aneurysm ([place])   . Arthritis   . Basal cell cancer Dec. 24, 2013   Nose  . CAD (coronary artery disease)    a. s/p CABG 03/2018.  Marland Kitchen Cancer [facility]) Aug. 2013   prostate Seeding  implant  . Carotid artery  occlusion   . Enlarged prostate 2008   Thermotherapy  . Gastroesophageal reflux disease   . GI bleed   . Hyperlipidemia   . Hypertension   . Mild aortic stenosis   . Myelodysplasia (myelodysplastic syndrome) ([place])   . Pancytopenia ([place])    sees Dr. Grayland Ormond  . Peripheral vascular disease ([place])   . Postoperative atrial fibrillation ([place])   . Psoriasis   . S/P CABG (coronary artery bypass graft)     Past Surgical History:  Procedure Laterality Date  . ABDOMINAL AORTIC ANEURYSM REPAIR  1999  . CARDIAC CATHETERIZATION  10/2009  . CHOLECYSTECTOMY    . CORONARY ARTERY BYPASS GRAFT N/A 04/08/2018   Procedure: CORONARY ARTERY BYPASS GRAFTING (CABG) x four, using left mammary artery and right leg greater saphenous vein harvested endoscopically.;  Surgeon: Gaye Pollack, MD;  Location: [facility] OR;  Service: Open Heart Surgery;  Laterality: N/A;  . ESOPHAGOGASTRODUODENOSCOPY (EGD) WITH PROPOFOL N/A 06/26/2016   Procedure: ESOPHAGOGASTRODUODENOSCOPY (EGD) WITH PROPOFOL;  Surgeon: Lollie Sails, MD;  Location: [facility] ENDOSCOPY;  Service: Endoscopy;  Laterality: N/A;  . LEFT HEART CATH AND CORONARY ANGIOGRAPHY N/A 03/15/2018   Procedure: LEFT HEART CATH AND CORONARY ANGIOGRAPHY;  Surgeon: Jettie Booze, MD;  Location:  [place] INVASIVE CV LAB;  Service: Cardiovascular;  Laterality: N/A;  . PROSTATE SURGERY    . SPINE SURGERY  2009   ruptured disc  . TEE WITHOUT CARDIOVERSION N/A 04/08/2018   Procedure: TRANSESOPHAGEAL ECHOCARDIOGRAM (TEE);  Surgeon: Gaye Pollack, MD;  Location: [place];  Service: Open Heart Surgery;  Laterality: N/A;  . Visceral angiogram  05/14/2012   Right  groin area  . VISCERAL ANGIOGRAM N/A 05/14/2012   Procedure: VISCERAL ANGIOGRAM;  Surgeon: Elam Dutch, MD;  Location: [facility] CATH LAB;  Service: Cardiovascular;  Laterality: N/A;     Current Outpatient Medications  Medication Sig Dispense Refill  . amLODipine (NORVASC) 10 MG tablet Take 0.5 tablets (5 mg total) by mouth  daily. 60 tablet 1  . Glucosamine-Chondroitin (COSAMIN DS PO) Take 2 tablets by mouth daily at 12 noon.    Marland Kitchen MAGNESIUM CITRATE PO Take 400 mg by mouth daily after supper.     . Melatonin 500 MCG TBDP Take 500 mcg by mouth at bedtime.    Marland Kitchen MELATONIN PO Take 1 tablet by mouth at bedtime.    . Multiple Vitamin (MULTIVITAMIN WITH MINERALS) TABS tablet Take 1 tablet by mouth daily at 12 noon. Centrum Silver    . polyethylene glycol (MIRALAX / GLYCOLAX) packet Take 17 g by mouth daily. 14 each 0  . rosuvastatin (CRESTOR) 20 MG tablet Take 20 mg by mouth daily.    . valsartan-hydrochlorothiazide (DIOVAN HCT) 160-12.5 MG tablet Take 1 tablet by mouth daily. 30 tablet 1   No current facility-administered medications for this visit.     Allergies:   Ciprofloxacin    Social History:  The patient  reports that he quit smoking about 22 years ago. His smoking use included cigarettes. He has never used smokeless tobacco. He reports that he does not drink alcohol or use drugs.   Family History:  The patient's family history includes COPD in his father; Cancer in his sister; Heart attack in his brother and father; Heart disease in his brother and father.    ROS:  Please see the history of present illness.   Otherwise, review of systems are positive for fatigue.   All other systems are reviewed and negative.    PHYSICAL EXAM: VS:  BP 140/60   Pulse 74   Ht 5\' 9"  (1.753 m)   Wt 155 lb (70.3 kg)   SpO2 99%   BMI 22.89 kg/m  , BMI Body mass index is 22.89 kg/m. GEN: Well nourished, well developed, in no acute distress  HEENT: normal  Neck: no JVD, carotid bruits, or masses Cardiac: RRR; no murmurs, rubs, or gallops,no edema  Respiratory:  clear to auscultation bilaterally, normal work of breathing GI: soft, nontender, nondistended, + BS MS: no deformity or atrophy  Skin: warm and dry, no rash Neuro:  Strength and sensation are intact Psych: euthymic mood, full affect   EKG:   The ekg ordered  today demonstrates NSR, RBBB, LAFB PACs   Recent Labs: 01/01/2019: BUN 17; Potassium 3.7; Sodium 139 08/09/2019: Hemoglobin 11.7; Platelets 37 08/18/2019: Creatinine, Ser 0.90   Lipid Panel No results found for: CHOL, TRIG, HDL, CHOLHDL, VLDL, LDLCALC, LDLDIRECT   Other studies Reviewed: Additional studies/ records that were reviewed today with results demonstrating: .   ASSESSMENT AND PLAN:  1. CAD: No angina.  COntinue aggressive secondary prevention.  He is exercising regularly.  No further cardiac testing needed. 2. AAA: Preop eval for stent graft with Dr. Oneida Alar.  Surery scheduled  for next week. 3. Bradycardia: This has been an issue.  No presyncope or syncope.  Avoid rate slowing meds.  Premature beats noted. THis may be making his HR form his BP cuff artificially low. 4. HTN: The current medical regimen is effective;  continue present plan and medications.      Current medicines are reviewed at length with the patient today.  The patient concerns regarding his medicines were addressed.  The following changes have been made:  No change  Labs/ tests ordered today include:  No orders of the defined types were placed in this encounter.   Recommend 150 minutes/week of aerobic exercise Low fat, low carb, high fiber diet recommended  Disposition:   FU in 1 year   Signed, Larae Grooms, MD  09/05/2019 11:51 AM    [facility] [place], [place], [place]  [address] Phone: [phone]; Fax: [phone]

## 2019-09-05 ENCOUNTER — Encounter: Payer: Self-pay | Admitting: Interventional Cardiology

## 2019-09-05 ENCOUNTER — Ambulatory Visit (INDEPENDENT_AMBULATORY_CARE_PROVIDER_SITE_OTHER): Payer: Medicare Other | Admitting: Interventional Cardiology

## 2019-09-05 ENCOUNTER — Other Ambulatory Visit: Payer: Self-pay

## 2019-09-05 VITALS — BP 140/60 | HR 74 | Ht 69.0 in | Wt 155.0 lb

## 2019-09-05 DIAGNOSIS — I491 Atrial premature depolarization: Secondary | ICD-10-CM

## 2019-09-05 DIAGNOSIS — I714 Abdominal aortic aneurysm, without rupture, unspecified: Secondary | ICD-10-CM

## 2019-09-05 DIAGNOSIS — I25118 Atherosclerotic heart disease of native coronary artery with other forms of angina pectoris: Secondary | ICD-10-CM | POA: Diagnosis not present

## 2019-09-05 DIAGNOSIS — I10 Essential (primary) hypertension: Secondary | ICD-10-CM

## 2019-09-05 DIAGNOSIS — Z01810 Encounter for preprocedural cardiovascular examination: Secondary | ICD-10-CM

## 2019-09-05 NOTE — Patient Instructions (Signed)
Medication Instructions:  Your physician recommends that you continue on your current medications as directed. Please refer to the Current Medication list given to you today.  If you need a refill on your cardiac medications before your next appointment, please call your pharmacy.   Lab work: None Ordered   Testing/Procedures: None Ordered  Follow-Up: At [facility], you and your health needs are our priority.  As part of our continuing mission to provide you with exceptional heart care, we have created designated Provider Care Teams.  These Care Teams include your primary Cardiologist (physician) and Advanced Practice Providers (APPs -  Physician Assistants and Nurse Practitioners) who all work together to provide you with the care you need, when you need it. You will need a follow up appointment in 1 years.  Please call our office 2 months in advance to schedule this appointment.  You may see Larae Grooms, MD or one of the following Advanced Practice Providers on your designated Care Team:   [place], PA-C Melina Copa, PA-C . Ermalinda Barrios, PA-

## 2019-09-06 ENCOUNTER — Inpatient Hospital Stay (HOSPITAL_BASED_OUTPATIENT_CLINIC_OR_DEPARTMENT_OTHER): Payer: Medicare Other | Admitting: Oncology

## 2019-09-06 ENCOUNTER — Inpatient Hospital Stay: Payer: Medicare Other

## 2019-09-06 ENCOUNTER — Inpatient Hospital Stay: Payer: Medicare Other | Attending: Oncology

## 2019-09-06 ENCOUNTER — Other Ambulatory Visit: Payer: Self-pay

## 2019-09-06 ENCOUNTER — Encounter: Payer: Self-pay | Admitting: Oncology

## 2019-09-06 VITALS — BP 140/66 | HR 61 | Temp 96.0°F | Wt 152.0 lb

## 2019-09-06 DIAGNOSIS — D469 Myelodysplastic syndrome, unspecified: Secondary | ICD-10-CM

## 2019-09-06 DIAGNOSIS — I10 Essential (primary) hypertension: Secondary | ICD-10-CM | POA: Insufficient documentation

## 2019-09-06 DIAGNOSIS — D696 Thrombocytopenia, unspecified: Secondary | ICD-10-CM

## 2019-09-06 DIAGNOSIS — I4891 Unspecified atrial fibrillation: Secondary | ICD-10-CM | POA: Diagnosis not present

## 2019-09-06 DIAGNOSIS — E785 Hyperlipidemia, unspecified: Secondary | ICD-10-CM | POA: Insufficient documentation

## 2019-09-06 DIAGNOSIS — D649 Anemia, unspecified: Secondary | ICD-10-CM | POA: Diagnosis not present

## 2019-09-06 DIAGNOSIS — C61 Malignant neoplasm of prostate: Secondary | ICD-10-CM | POA: Insufficient documentation

## 2019-09-06 DIAGNOSIS — Z87891 Personal history of nicotine dependence: Secondary | ICD-10-CM | POA: Diagnosis not present

## 2019-09-06 DIAGNOSIS — Z79899 Other long term (current) drug therapy: Secondary | ICD-10-CM | POA: Diagnosis not present

## 2019-09-06 LAB — CBC WITH DIFFERENTIAL/PLATELET
Abs Immature Granulocytes: 0.01 10*3/uL (ref 0.00–0.07)
Basophils Absolute: 0 10*3/uL (ref 0.0–0.1)
Basophils Relative: 1 %
Eosinophils Absolute: 0.2 10*3/uL (ref 0.0–0.5)
Eosinophils Relative: 5 %
HCT: 35.7 % — ABNORMAL LOW (ref 39.0–52.0)
Hemoglobin: 11.7 g/dL — ABNORMAL LOW (ref 13.0–17.0)
Immature Granulocytes: 0 %
Lymphocytes Relative: 30 %
Lymphs Abs: 1.1 10*3/uL (ref 0.7–4.0)
MCH: 29.4 pg (ref 26.0–34.0)
MCHC: 32.8 g/dL (ref 30.0–36.0)
MCV: 89.7 fL (ref 80.0–100.0)
Monocytes Absolute: 0.7 10*3/uL (ref 0.1–1.0)
Monocytes Relative: 19 %
Neutro Abs: 1.7 10*3/uL (ref 1.7–7.7)
Neutrophils Relative %: 45 %
Platelets: 32 10*3/uL — ABNORMAL LOW (ref 150–400)
RBC: 3.98 MIL/uL — ABNORMAL LOW (ref 4.22–5.81)
RDW: 13.2 % (ref 11.5–15.5)
WBC: 3.7 10*3/uL — ABNORMAL LOW (ref 4.0–10.5)
nRBC: 0 % (ref 0.0–0.2)

## 2019-09-06 MED ORDER — ROMIPLOSTIM 250 MCG ~~LOC~~ SOLR
2.0000 ug/kg | Freq: Once | SUBCUTANEOUS | Status: AC
  Administered 2019-09-06: 140 ug via SUBCUTANEOUS
  Filled 2019-09-06: qty 0.28

## 2019-09-06 NOTE — Progress Notes (Signed)
[place]  [phone] [phone]  ID: Hale Drone OB: December 04, 1926  MR#: [id number]  QKM#:[id number]  Patient Care Team: [name], MD as PCP - General (Family Medicine) Jettie Booze, MD as PCP - Cardiology (Cardiology) Lollie Sails, MD as Consulting Physician (Gastroenterology) Lloyd Huger, MD as Consulting Physician (Oncology)  CHIEF COMPLAINT: MDS, thrombocytopenia  INTERVAL HISTORY: Patient returns to clinic today as an add-on for repeat laboratory work and consideration of Nplate.  He has an enlarging aortic aneurysm requiring repair and his procedure is scheduled for next week.  He currently feels well and is at his baseline. He has no neurologic complaints. He has a good appetite and denies weight loss.  He denies any chest pain, shortness of breath, cough, or hemoptysis.  He has no nausea, vomiting, constipation, or diarrhea.  He has no urinary complaints.  Patient offers no specific complaints today.  REVIEW OF SYSTEMS:   Review of Systems  Constitutional: Negative.  Negative for fever, malaise/fatigue and weight loss.  Respiratory: Negative.  Negative for cough and shortness of breath.   Cardiovascular: Negative.  Negative for chest pain and leg swelling.  Gastrointestinal: Negative for abdominal pain, blood in stool, melena, nausea and vomiting.  Genitourinary: Negative.  Negative for hematuria.  Musculoskeletal: Negative.  Negative for back pain.  Skin: Negative.  Negative for rash.  Neurological: Negative.  Negative for dizziness, sensory change, focal weakness, weakness and headaches.  Endo/Heme/Allergies: Bruises/bleeds easily.  Psychiatric/Behavioral: Negative.  The patient is not nervous/anxious and does not have insomnia.     As per HPI. Otherwise, a complete review of systems is negative.  PAST MEDICAL HISTORY: Past Medical History:  Diagnosis Date   AAA (abdominal aortic aneurysm) ([place])  1999   a. treated with open repair in 1999 with subsequent distal anastomotic pseudoaneurysm that is being followed by Dr. Oneida Alar.   Adenomatous colon polyp    Aorta aneurysm ([place])    Arthritis    Basal cell cancer Dec. 24, 2013   Nose   CAD (coronary artery disease)    a. s/p CABG 03/2018.   Cancer [facility]) Aug. 2013   prostate Seeding  implant   Carotid artery occlusion    Enlarged prostate 2008   Thermotherapy   Gastroesophageal reflux disease    GI bleed    Hyperlipidemia    Hypertension    Mild aortic stenosis    Myelodysplasia (myelodysplastic syndrome) (HCC)    Pancytopenia (H[facility])    sees Dr. Grayland Ormond   Peripheral vascular disease ([place])    Postoperative atrial fibrillation ([place])    Psoriasis    S/P CABG (coronary artery bypass graft)     PAST SURGICAL HISTORY: Past Surgical History:  Procedure Laterality Date   ABDOMINAL AORTIC ANEURYSM REPAIR  1999   CARDIAC CATHETERIZATION  10/2009   CHOLECYSTECTOMY     CORONARY ARTERY BYPASS GRAFT N/A 04/08/2018   Procedure: CORONARY ARTERY BYPASS GRAFTING (CABG) x four, using left mammary artery and right leg greater saphenous vein harvested endoscopically.;  Surgeon: Gaye Pollack, MD;  Location: [facility] OR;  Service: Open Heart Surgery;  Laterality: N/A;   ESOPHAGOGASTRODUODENOSCOPY (EGD) WITH PROPOFOL N/A 06/26/2016   Procedure: ESOPHAGOGASTRODUODENOSCOPY (EGD) WITH PROPOFOL;  Surgeon: Lollie Sails, MD;  Location: [facility] ENDOSCOPY;  Service: Endoscopy;  Laterality: N/A;   LEFT HEART CATH AND CORONARY ANGIOGRAPHY N/A 03/15/2018   Procedure: LEFT HEART CATH AND CORONARY ANGIOGRAPHY;  Surgeon: Jettie Booze, MD;  Location: [place] CV  LAB;  Service: Cardiovascular;  Laterality: N/A;   [place] SURGERY  2009   ruptured disc   TEE WITHOUT CARDIOVERSION N/A 04/08/2018   Procedure: TRANSESOPHAGEAL ECHOCARDIOGRAM (TEE);  Surgeon: Gaye Pollack, MD;  Location: [place];  Service: Open Heart  Surgery;  Laterality: N/A;   Visceral angiogram  05/14/2012   Right  groin area   VISCERAL ANGIOGRAM N/A 05/14/2012   Procedure: VISCERAL ANGIOGRAM;  Surgeon: Elam Dutch, MD;  Location: [facility] CATH LAB;  Service: Cardiovascular;  Laterality: N/A;    FAMILY HISTORY Family History  Problem Relation Age of Onset   Heart attack Father    Heart disease Father        After age 97   COPD Father    Cancer Sister        Breast and Brain   Heart disease Brother        before age 47   Heart attack Brother        ADVANCED DIRECTIVES:    HEALTH MAINTENANCE: Social History   Tobacco Use   Smoking status: Former Smoker    Types: Cigarettes    Quit date: 04/29/1997    Years since quitting: 22.3   Smokeless tobacco: Never Used  Substance Use Topics   Alcohol use: No   Drug use: No     Allergies  Allergen Reactions   Ciprofloxacin Other (See Comments)    Abdominal Pain     Current Outpatient Medications  Medication Sig Dispense Refill   amLODipine (NORVASC) 10 MG tablet Take 0.5 tablets (5 mg total) by mouth daily. 60 tablet 1   Glucosamine-Chondroitin (COSAMIN DS PO) Take 2 tablets by mouth daily at 12 noon.     Magnesium 100 MG TABS Take 200 mg by mouth daily with supper.     Melatonin 3 MG TABS Take 3 mg by mouth at bedtime.     Multiple Vitamin (MULTIVITAMIN WITH MINERALS) TABS tablet Take 1 tablet by mouth daily at 12 noon. Centrum Silver     oxyCODONE-acetaminophen (PERCOCET/ROXICET) 5-325 MG tablet Take 1 tablet by mouth every 8 (eight) hours as needed for moderate pain or severe pain.     polyethylene glycol (MIRALAX / GLYCOLAX) packet Take 17 g by mouth daily. (Patient taking differently: Take 17 g by mouth daily as needed for mild constipation. ) 14 each 0   rosuvastatin (CRESTOR) 20 MG tablet Take 20 mg by mouth daily.     valsartan-hydrochlorothiazide (DIOVAN HCT) 160-12.5 MG tablet Take 1 tablet by mouth daily. 30 tablet 1   No current  facility-administered medications for this visit.     OBJECTIVE: Vitals:   09/06/19 0857  BP: 140/66  Pulse: 61  Temp: (!) 96 F (35.6 C)     Body mass index is 22.45 kg/m.    ECOG FS:0 - Asymptomatic  General: Well-developed, well-nourished, no acute distress. Eyes: Pink conjunctiva, anicteric sclera. HEENT: Normocephalic, moist mucous membranes. Lungs: Clear to auscultation bilaterally. Heart: Regular rate and rhythm. No rubs, murmurs, or gallops. Abdomen: Soft, nontender, nondistended. No organomegaly noted, normoactive bowel sounds. Musculoskeletal: No edema, cyanosis, or clubbing. Neuro: Alert, answering all questions appropriately. Cranial nerves grossly intact. Skin: No rashes or petechiae noted. Psych: Normal affect.  LAB RESULTS:  Lab Results  Component Value Date   NA 139 01/01/2019   K 3.7 01/01/2019   CL 105 01/01/2019   CO2 27 01/01/2019   GLUCOSE 118 (H) 01/01/2019   BUN 17 01/01/2019  CREATININE 0.90 08/18/2019   CALCIUM 9.2 01/01/2019   PROT 5.0 (L) 04/12/2018   ALBUMIN 3.0 (L) 04/12/2018   AST 29 04/12/2018   ALT 16 (L) 04/12/2018   ALKPHOS 41 04/12/2018   BILITOT 1.3 (H) 04/12/2018   GFRNONAA >60 01/01/2019   GFRAA >60 01/01/2019    Lab Results  Component Value Date   WBC 3.7 (L) 09/06/2019   NEUTROABS 1.7 09/06/2019   HGB 11.7 (L) 09/06/2019   HCT 35.7 (L) 09/06/2019   MCV 89.7 09/06/2019   PLT 32 (L) 09/06/2019     STUDIES: Ct Angio Abdomen Pelvis  W &/or Wo Contrast  Result Date: 08/18/2019 CLINICAL DATA:  85 year old male with a history of abdominal aortic aneurysm status post repair in 1999. Additional clinical history includes prostate cancer. EXAM: CTA ABDOMEN AND PELVIS wITHOUT AND WITH CONTRAST TECHNIQUE: Multidetector CT imaging of the abdomen and pelvis was performed using the standard protocol during bolus administration of intravenous contrast. Multiplanar reconstructed images and MIPs were obtained and reviewed to evaluate  the vascular anatomy. CONTRAST:  61m ISOVUE-370 IOPAMIDOL (ISOVUE-370) INJECTION 76% COMPARISON:  Prior CT abdomen and pelvis 02/09/2018 FINDINGS: VASCULAR Aorta: Penetrating atherosclerotic ulcer at the 9 o'clock position of the descending thoracic aorta. The maximal aortic diameter measures 3.3 cm at this location. Heterogeneous plaque without ulceration extends throughout the abdominal aorta. Surgical changes of prior infrarenal aortic tube graft repair. There is a region of focal pseudoaneurysm formation at the distal anastomosis. The aorta measures up to 4.9 cm at this location, increased compared to 4.5 cm previously. The enhancing portion of the pseudoaneurysm measures 1.9 x 1.3 cm, increased compared to 1.7 x 1.1 cm. Including the thrombosed portion, the overall pseudoaneurysm measures 2.9 x 3.3 cm, significantly enlarged compared to 2.4 x 2.2 cm previously. Trace indistinctness and stranding about the cephalad margin of the pseudoaneurysm is concerning but appears stable compared to the prior. Celiac: Patent without evidence of aneurysm, dissection, vasculitis or significant stenosis. Extremely tortuous splenic artery with peripheral calcifications. There are a few areas of mild fusiform ectasia but no significant aneurysmal dilatation. SMA: Mild fibrofatty atherosclerotic plaque at the origin without significant stenosis. The artery is patent. No aneurysm or dissection. Renals: Solitary renal arteries bilaterally. Focal moderate stenosis at the origin of the left renal artery. No significant stenosis within the right renal artery. IMA: Occluded secondary to prior tube graft repair of abdominal aortic aneurysm. Inflow: Tortuous with scattered atherosclerotic plaque. No aneurysm, dissection or significant stenosis. Proximal Outflow: Bilateral common femoral and visualized portions of the superficial and profunda femoral arteries are patent without evidence of aneurysm, dissection, vasculitis or significant  stenosis. Veins: No focal venous abnormality. Review of the MIP images confirms the above findings. NON-VASCULAR Lower chest: The lung bases are clear. Visualized cardiac structures are within normal limits for size. No pericardial effusion. Unremarkable visualized distal thoracic esophagus. Mildly thickened and calcified aortic valve. Hepatobiliary: No focal liver abnormality is seen. Status post cholecystectomy. No biliary dilatation. Pancreas: Unremarkable. No pancreatic ductal dilatation or surrounding inflammatory changes. Spleen: Normal in size without focal abnormality. Adrenals/Urinary Tract: Normal adrenal glands. No evidence of hydronephrosis, nephrolithiasis or enhancing renal mass. Multifocal circumscribed renal cysts, more prominent on the left than the right. Chronic atrophy of the medial lower pole of the left kidney likely from occlusion of a small accessory renal artery which was ligated during the prior abdominal aortic aneurysm repair. Stomach/Bowel: No focal bowel wall thickening or evidence of obstruction. Unremarkable stomach and proximal duodenum.  Lymphatic: No suspicious lymphadenopathy. Reproductive: Brachytherapy seeds throughout the prostate gland. Bladder is unremarkable. Other: No abdominal wall hernia or evidence of ascites. Musculoskeletal: No acute fracture or aggressive appearing lytic or blastic osseous lesion. Advanced multilevel degenerative disc disease. IMPRESSION: VASCULAR 1. Enlarging pseudoaneurysm at the distal anastomosis of the prior open tube graft repair of the abdominal aorta. The maximal aortic diameter at the site of the pseudoaneurysm is now 4.9 cm compared to 4.5 cm previously. The pseudoaneurysm itself now measures 3.3 x 2.9 cm compared to 2.4 x 2.2 cm. 2. Aortic aneurysm NOS (ICD10-I71.9); Aortic Atherosclerosis (ICD10-170.0) 3. Additional ancillary findings as above. NON-VASCULAR 1. Additional ancillary findings as above without significant interval change. These  results were called by telephone at the time of interpretation on 08/18/2019 at 4:53 pm to Dr. [name] , who verbally acknowledged these results. Signed, Criselda Peaches, MD, [facility] Radiology Electronically Signed   By: Jacqulynn Cadet M.D.   On: 08/18/2019 16:54    ASSESSMENT: MDS, thrombocytopenia.  PLAN:    1. MDS: Confirmed by bone marrow biopsy. 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  Given his absolute increase in monocytes the possibility of an early/evolving CMML cannot be excluded. Cytogenics are normal 46XY. No increase in blast count.  Return to clinic as previously scheduled. 2. 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  Patient's platelet count has trended down slightly to 32. His platelet count has ranged between 37 and 60 since April 2019.  Upon further review of the chart patient's platelet count has not been above 100 since February 2013.  Proceed with 2 mg/kg Nplate today.  Unclear if this will improve his platelet count in time for his aneurysm repair.  Recommend checking platelet count on day of procedure and if he has not responded appropriately, he will need platelet transfusion prior to and during his aneurysm repair.  Return to clinic as previously scheduled as above.   3.  Prostate cancer: Patient is status post seed implantation.  His last PSA on March 19, 2015 was reported as 0.03.  4.  Cardiac disease/atrial fibrillation: Continue follow-up and treatment per cardiac surgery. 5.  History of GI bleed: Continue monitoring by GI.  Patient has discontinued Aciphex. 6.  Leukopenia: Resolved. 7.  Anemia: Mild, monitor.   Patient expressed understanding and was in agreement with this plan. He also understands that He can call clinic at any time with any questions, concerns, or complaints.   Lloyd Huger, MD   09/06/2019 11:54 AM

## 2019-09-07 NOTE — Progress Notes (Signed)
[id number] [name], [place] [address] Phone: [phone] Fax: [phone]    Your procedure is scheduled on September 12, 2019.  Report to [facility] Main Entrance "A" at 05:30 A.M., and check in at the Admitting office.  Call this number if you have problems the morning of surgery:  [phone]  Call [phone] if you have any questions prior to your surgery date Monday-Friday 8am-4pm   Remember:  Do not eat or drink after midnight the night before your surgery   Take these medicines the morning of surgery with A SIP OF WATER : Amlodipine (Norvasc) Oxycodone-acetaminophen (Percocet) Rosuvastatin (Crestor)  7 days prior to surgery STOP taking any Aspirin (unless otherwise instructed by your surgeon), Aleve, Naproxen, Ibuprofen, Motrin, Advil, Goody's, BC's, all herbal medications, fish oil, and all vitamins.    The Morning of Surgery  Do not wear jewelry.  Do not wear lotions, powders, or perfumes/colognes, or deodorant  Do not shave 48 hours prior to surgery.  Men may shave face and neck.  Do not bring valuables to the hospital.  [facility] is not responsible for any belongings or valuables.  If you are a smoker, DO NOT Smoke 24 hours prior to surgery IF you wear a CPAP at night please bring your mask, tubing, and machine the morning of surgery   Remember that you must have someone to transport you home after your surgery, and remain with you for 24 hours if you are discharged the same day.  Contacts, glasses, hearing aids, dentures or bridgework may not be worn into surgery.   Leave your suitcase in the car.  After surgery it may be brought to your room.  For patients admitted to the hospital, discharge time will be determined by your treatment team.  Patients discharged the day of surgery will not be allowed to drive home.    Special instructions:   [facility]- Preparing For Surgery  Before surgery, you  can play an important role. Because skin is not sterile, your skin needs to be as free of germs as possible. You can reduce the number of germs on your skin by washing with CHG (chlorahexidine gluconate) Soap before surgery.  CHG is an antiseptic cleaner which kills germs and bonds with the skin to continue killing germs even after washing.    Oral Hygiene is also important to reduce your risk of infection.  Remember - BRUSH YOUR TEETH THE MORNING OF SURGERY WITH YOUR REGULAR TOOTHPASTE  Please do not use if you have an allergy to CHG or antibacterial soaps. If your skin becomes reddened/irritated stop using the CHG.  Do not shave (including legs and underarms) for at least 48 hours prior to first CHG shower. It is OK to shave your face.  Please follow these instructions carefully.   1. Shower the NIGHT BEFORE SURGERY and the MORNING OF SURGERY with CHG Soap.   2. If you chose to wash your hair, wash your hair first as usual with your normal shampoo.  3. After you shampoo, rinse your hair and body thoroughly to remove the shampoo.  4. Use CHG as you would any other liquid soap. You can apply CHG directly to the skin and wash gently with a scrungie or a clean washcloth.   5. Apply the CHG Soap to your body ONLY FROM THE NECK DOWN.  Do not use on open wounds or open sores. Avoid contact with your eyes, ears,  mouth and genitals (private parts). Wash Face and genitals (private parts)  with your normal soap.   6. Wash thoroughly, paying special attention to the area where your surgery will be performed.  7. Thoroughly rinse your body with warm water from the neck down.  8. DO NOT shower/wash with your normal soap after using and rinsing off the CHG Soap.  9. Pat yourself dry with a CLEAN TOWEL.  10. Wear CLEAN PAJAMAS to bed the night before surgery, wear comfortable clothes the morning of surgery  11. Place CLEAN SHEETS on your bed the night of your first shower and DO NOT SLEEP WITH  PETS.    Day of Surgery:  Do not apply any deodorants/lotions. Please shower the morning of surgery with the CHG soap  Please wear clean clothes to the hospital/surgery center.   Remember to brush your teeth WITH YOUR REGULAR TOOTHPASTE.   Please read over the following fact sheets that you were given.

## 2019-09-08 ENCOUNTER — Telehealth: Payer: Self-pay | Admitting: *Deleted

## 2019-09-08 ENCOUNTER — Encounter (HOSPITAL_COMMUNITY): Payer: Self-pay

## 2019-09-08 ENCOUNTER — Encounter (HOSPITAL_COMMUNITY)
Admission: RE | Admit: 2019-09-08 | Discharge: 2019-09-08 | Disposition: A | Discharge status: Home / Self Care | Payer: Medicare Other | Source: Ambulatory Visit | Attending: Vascular Surgery | Admitting: Vascular Surgery

## 2019-09-08 ENCOUNTER — Other Ambulatory Visit: Payer: Self-pay

## 2019-09-08 DIAGNOSIS — Z01818 Encounter for other preprocedural examination: Secondary | ICD-10-CM | POA: Insufficient documentation

## 2019-09-08 DIAGNOSIS — I739 Peripheral vascular disease, unspecified: Secondary | ICD-10-CM | POA: Diagnosis not present

## 2019-09-08 DIAGNOSIS — E785 Hyperlipidemia, unspecified: Secondary | ICD-10-CM | POA: Diagnosis not present

## 2019-09-08 DIAGNOSIS — D469 Myelodysplastic syndrome, unspecified: Secondary | ICD-10-CM | POA: Diagnosis not present

## 2019-09-08 DIAGNOSIS — I10 Essential (primary) hypertension: Secondary | ICD-10-CM | POA: Insufficient documentation

## 2019-09-08 DIAGNOSIS — I714 Abdominal aortic aneurysm, without rupture: Secondary | ICD-10-CM | POA: Insufficient documentation

## 2019-09-08 DIAGNOSIS — I2510 Atherosclerotic heart disease of native coronary artery without angina pectoris: Secondary | ICD-10-CM | POA: Insufficient documentation

## 2019-09-08 DIAGNOSIS — Z87891 Personal history of nicotine dependence: Secondary | ICD-10-CM | POA: Insufficient documentation

## 2019-09-08 DIAGNOSIS — Z79899 Other long term (current) drug therapy: Secondary | ICD-10-CM | POA: Diagnosis not present

## 2019-09-08 DIAGNOSIS — Z85828 Personal history of other malignant neoplasm of skin: Secondary | ICD-10-CM | POA: Insufficient documentation

## 2019-09-08 DIAGNOSIS — Z8546 Personal history of malignant neoplasm of prostate: Secondary | ICD-10-CM | POA: Diagnosis not present

## 2019-09-08 DIAGNOSIS — K219 Gastro-esophageal reflux disease without esophagitis: Secondary | ICD-10-CM | POA: Insufficient documentation

## 2019-09-08 DIAGNOSIS — L409 Psoriasis, unspecified: Secondary | ICD-10-CM | POA: Insufficient documentation

## 2019-09-08 DIAGNOSIS — M1990 Unspecified osteoarthritis, unspecified site: Secondary | ICD-10-CM | POA: Insufficient documentation

## 2019-09-08 DIAGNOSIS — Z951 Presence of aortocoronary bypass graft: Secondary | ICD-10-CM | POA: Diagnosis not present

## 2019-09-08 LAB — URINALYSIS, ROUTINE W REFLEX MICROSCOPIC
Bilirubin Urine: NEGATIVE
Glucose, UA: NEGATIVE mg/dL
Hgb urine dipstick: NEGATIVE
Ketones, ur: NEGATIVE mg/dL
Leukocytes,Ua: NEGATIVE
Nitrite: NEGATIVE
Protein, ur: NEGATIVE mg/dL
Specific Gravity, Urine: 1.015 (ref 1.005–1.030)
pH: 6 (ref 5.0–8.0)

## 2019-09-08 LAB — CBC
HCT: 38.2 % — ABNORMAL LOW (ref 39.0–52.0)
Hemoglobin: 12.8 g/dL — ABNORMAL LOW (ref 13.0–17.0)
MCH: 30.6 pg (ref 26.0–34.0)
MCHC: 33.5 g/dL (ref 30.0–36.0)
MCV: 91.4 fL (ref 80.0–100.0)
Platelets: 40 10*3/uL — ABNORMAL LOW (ref 150–400)
RBC: 4.18 MIL/uL — ABNORMAL LOW (ref 4.22–5.81)
RDW: 13.4 % (ref 11.5–15.5)
WBC: 4.8 10*3/uL (ref 4.0–10.5)
nRBC: 0 % (ref 0.0–0.2)

## 2019-09-08 LAB — COMPREHENSIVE METABOLIC PANEL
ALT: 18 U/L (ref 0–44)
AST: 32 U/L (ref 15–41)
Albumin: 4.4 g/dL (ref 3.5–5.0)
Alkaline Phosphatase: 49 U/L (ref 38–126)
Anion gap: 10 (ref 5–15)
BUN: 17 mg/dL (ref 8–23)
CO2: 25 mmol/L (ref 22–32)
Calcium: 9.2 mg/dL (ref 8.9–10.3)
Chloride: 104 mmol/L (ref 98–111)
Creatinine, Ser: 0.87 mg/dL (ref 0.61–1.24)
GFR calc Af Amer: >60 mL/min (ref >60)
GFR calc non Af Amer: >60 mL/min (ref >60)
Glucose, Bld: 96 mg/dL (ref 70–99)
Potassium: 3.9 mmol/L (ref 3.5–5.1)
Sodium: 139 mmol/L (ref 135–145)
Total Bilirubin: 0.4 mg/dL (ref 0.3–1.2)

## 2019-09-08 LAB — BLOOD GAS, ARTERIAL
Acid-Base Excess: 1.2 mmol/L (ref 0.0–2.0)
Bicarbonate: 24.7 mmol/L (ref 20.0–28.0)
Drawn by: 129711
FIO2: 21
O2 Saturation: 97.6 %
Patient temperature: 98.6
pCO2 arterial: 35.2 mmHg (ref 32.0–48.0)
pH, Arterial: 7.46 — ABNORMAL HIGH (ref 7.350–7.450)
pO2, Arterial: 92.4 mmHg (ref 83.0–108.0)

## 2019-09-08 LAB — SURGICAL PCR SCREEN
MRSA, PCR: NEGATIVE
Staphylococcus aureus: NEGATIVE

## 2019-09-08 LAB — APTT: aPTT: 35 seconds (ref 24–36)

## 2019-09-08 LAB — PROTIME-INR
INR: 1.1 (ref 0.8–1.2)
Prothrombin Time: 14.2 seconds (ref 11.4–15.2)

## 2019-09-08 NOTE — Telephone Encounter (Signed)
Our PA, Leontine Locket will place orders for platelet adm to be done on Monday prior to surgery based on the following message from Dr. Oneida Alar today:  Hematology saw this patient. He may need platelets on Monday morning. Can you set up platelets for him.

## 2019-09-08 NOTE — Progress Notes (Signed)
PCP - [name], MD Cardiologist - Casandra Doffing, MD  Chest x-ray - N/A EKG - 09/05/2019 Stress Test - 2016 ECHO - 03/2018 Cardiac Cath - 02/2018  Sleep Study - denies CPAP - N/A  Blood Thinner Instructions: N/A Aspirin Instructions:N/A  COVID TEST- scheduled 09/10/2019; patient is aware of need for self-quarantine  Coronavirus Screening  Have you experienced the following symptoms:  Cough yes/no: No Fever (>100.83F)  yes/no: No Runny nose yes/no: No Sore throat yes/no: No Difficulty breathing/shortness of breath  yes/no: No  Have you or a family member traveled in the last 14 days and where? yes/no: No   If the patient indicates "YES" to the above questions, their PAT will be rescheduled to limit the exposure to others and, the surgeon will be notified. THE PATIENT WILL NEED TO BE ASYMPTOMATIC FOR 14 DAYS.   If the patient is not experiencing any of these symptoms, the PAT nurse will instruct them to NOT bring anyone with them to their appointment since they may have these symptoms or traveled as well.   Please remind your patients and families that hospital visitation restrictions are in effect and the importance of the restrictions.   Anesthesia review: No  Patient denies shortness of breath, fever, cough and chest pain at PAT appointment   Patient verbalized understanding of instructions that were given to them at the PAT appointment. Patient was also instructed that they will need to review over the PAT instructions again at home before surgery.

## 2019-09-08 NOTE — Progress Notes (Signed)
IB message sent to Dr. [name], MD and also spoke w/Kay at Dr. Oneida Alar' office to notify of abnormal labs:  Platelet count: 40  Office is aware and states physician is aware and has already spoken with Dr. Massie Maroon and plans to order platelets for Monday, 09/21 procedure.

## 2019-09-09 ENCOUNTER — Encounter (HOSPITAL_COMMUNITY): Payer: Self-pay | Admitting: Emergency Medicine

## 2019-09-09 ENCOUNTER — Other Ambulatory Visit: Payer: Self-pay

## 2019-09-09 ENCOUNTER — Emergency Department (HOSPITAL_COMMUNITY): Payer: Medicare Other

## 2019-09-09 ENCOUNTER — Telehealth: Payer: Self-pay | Admitting: Interventional Cardiology

## 2019-09-09 ENCOUNTER — Emergency Department (HOSPITAL_COMMUNITY)
Admission: EM | Admit: 2019-09-09 | Discharge: 2019-09-09 | Disposition: A | Discharge status: Home / Self Care | Payer: Medicare Other | Source: Home / Self Care | Attending: Emergency Medicine | Admitting: Emergency Medicine

## 2019-09-09 DIAGNOSIS — R1084 Generalized abdominal pain: Secondary | ICD-10-CM

## 2019-09-09 DIAGNOSIS — R109 Unspecified abdominal pain: Secondary | ICD-10-CM

## 2019-09-09 DIAGNOSIS — I10 Essential (primary) hypertension: Secondary | ICD-10-CM | POA: Insufficient documentation

## 2019-09-09 DIAGNOSIS — Z79899 Other long term (current) drug therapy: Secondary | ICD-10-CM | POA: Insufficient documentation

## 2019-09-09 DIAGNOSIS — R1032 Left lower quadrant pain: Secondary | ICD-10-CM | POA: Insufficient documentation

## 2019-09-09 DIAGNOSIS — I714 Abdominal aortic aneurysm, without rupture, unspecified: Secondary | ICD-10-CM

## 2019-09-09 DIAGNOSIS — Z87891 Personal history of nicotine dependence: Secondary | ICD-10-CM | POA: Insufficient documentation

## 2019-09-09 DIAGNOSIS — I259 Chronic ischemic heart disease, unspecified: Secondary | ICD-10-CM | POA: Insufficient documentation

## 2019-09-09 DIAGNOSIS — R1012 Left upper quadrant pain: Secondary | ICD-10-CM | POA: Insufficient documentation

## 2019-09-09 LAB — COMPREHENSIVE METABOLIC PANEL
ALT: 23 U/L (ref 0–44)
AST: 32 U/L (ref 15–41)
Albumin: 4.4 g/dL (ref 3.5–5.0)
Alkaline Phosphatase: 46 U/L (ref 38–126)
Anion gap: 9 (ref 5–15)
BUN: 14 mg/dL (ref 8–23)
CO2: 26 mmol/L (ref 22–32)
Calcium: 9.1 mg/dL (ref 8.9–10.3)
Chloride: 102 mmol/L (ref 98–111)
Creatinine, Ser: 0.92 mg/dL (ref 0.61–1.24)
GFR calc Af Amer: >60 mL/min (ref >60)
GFR calc non Af Amer: >60 mL/min (ref >60)
Glucose, Bld: 112 mg/dL — ABNORMAL HIGH (ref 70–99)
Potassium: 3.9 mmol/L (ref 3.5–5.1)
Sodium: 137 mmol/L (ref 135–145)
Total Bilirubin: 0.9 mg/dL (ref 0.3–1.2)
Total Protein: 6.5 g/dL (ref 6.5–8.1)

## 2019-09-09 LAB — URINALYSIS, ROUTINE W REFLEX MICROSCOPIC
Bacteria, UA: NONE SEEN
Bilirubin Urine: NEGATIVE
Glucose, UA: NEGATIVE mg/dL
Ketones, ur: NEGATIVE mg/dL
Leukocytes,Ua: NEGATIVE
Nitrite: NEGATIVE
Protein, ur: NEGATIVE mg/dL
Specific Gravity, Urine: 1.006 (ref 1.005–1.030)
pH: 6 (ref 5.0–8.0)

## 2019-09-09 LAB — CBC
HCT: 39.5 % (ref 39.0–52.0)
Hemoglobin: 12.7 g/dL — ABNORMAL LOW (ref 13.0–17.0)
MCH: 29.3 pg (ref 26.0–34.0)
MCHC: 32.2 g/dL (ref 30.0–36.0)
MCV: 91 fL (ref 80.0–100.0)
Platelets: 37 10*3/uL — ABNORMAL LOW (ref 150–400)
RBC: 4.34 MIL/uL (ref 4.22–5.81)
RDW: 13.5 % (ref 11.5–15.5)
WBC: 4 10*3/uL (ref 4.0–10.5)
nRBC: 0 % (ref 0.0–0.2)

## 2019-09-09 LAB — LIPASE, BLOOD: Lipase: 36 U/L (ref 11–51)

## 2019-09-09 MED ORDER — IOHEXOL 350 MG/ML SOLN
100.0000 mL | Freq: Once | INTRAVENOUS | Status: AC | PRN
  Administered 2019-09-09: 100 mL via INTRAVENOUS

## 2019-09-09 MED ORDER — SODIUM CHLORIDE 0.9% FLUSH: 3.0000 mL | Freq: Once | INTRAVENOUS | Status: DC

## 2019-09-09 MED ORDER — ALUM & MAG HYDROXIDE-SIMETH 200-200-20 MG/5ML PO SUSP
15.0000 mL | Freq: Once | ORAL | Status: AC
  Administered 2019-09-09: 19:00:00 15 mL via ORAL
  Filled 2019-09-09: qty 30

## 2019-09-09 NOTE — Consult Note (Signed)
Referring Physician: Stacey Street  Patient name: [name] MRN: [id number] DOB: November 30, 1926 Sex: male  REASON FOR CONSULT: Abdominal pain with known abdominal aortic aneurysm  HPI: [name] is a 85 y.o. male, with a known distal anastomotic pseudoaneurysm scheduled for repair with aneurysm stent graft on 09/12/2019.  Patient began experiencing left side abdominal pain earlier this morning.  He states the pain is about the same.  It has not gotten worse or better.  He had a CT angiogram in the emergency room which showed no evidence of rupture and no significant change in the aorta.  No other intra-abdominal pathology.  He has no fever or chills.  He currently has no nausea or vomiting.  He has been hemodynamically stable in the emergency room.  He had a normal bowel movement earlier today.  Past Medical History:  Diagnosis Date  . AAA (abdominal aortic aneurysm) ([place]) 1999   a. treated with open repair in 1999 with subsequent distal anastomotic pseudoaneurysm that is being followed by Dr. Oneida Alar.  . Adenomatous colon polyp   . Aorta aneurysm ([place])   . Arthritis   . Basal cell cancer Dec. 24, 2013   Nose  . CAD (coronary artery disease)    a. s/p CABG 03/2018.  Marland Kitchen Cancer [facility]) Aug. 2013   prostate Seeding  implant  . Carotid artery occlusion   . Enlarged prostate 2008   Thermotherapy  . Gastroesophageal reflux disease   . GI bleed   . Hyperlipidemia   . Hypertension   . Mild aortic stenosis   . Myelodysplasia (myelodysplastic syndrome) ([place])   . Pancytopenia ([place])    sees Dr. Grayland Ormond  . Peripheral vascular disease ([place])   . Postoperative atrial fibrillation ([place])   . Psoriasis   . S/P CABG (coronary artery bypass graft)    Past Surgical History:  Procedure Laterality Date  . ABDOMINAL AORTIC ANEURYSM REPAIR  1999  . CARDIAC CATHETERIZATION  10/2009  . CHOLECYSTECTOMY    . CORONARY ARTERY BYPASS GRAFT N/A 04/08/2018   Procedure: CORONARY ARTERY BYPASS GRAFTING  (CABG) x four, using left mammary artery and right leg greater saphenous vein harvested endoscopically.;  Surgeon: Gaye Pollack, MD;  [facility];  Service: Open Heart Surgery;  Laterality: N/A;  . ESOPHAGOGASTRODUODENOSCOPY (EGD) WITH PROPOFOL N/A 06/26/2016   Procedure: ESOPHAGOGASTRODUODENOSCOPY (EGD) WITH PROPOFOL;  Surgeon: Lollie Sails, MD;  Location: [facility] ENDOSCOPY;  Service: Endoscopy;  Laterality: N/A;  . EYE SURGERY  1990   cataract surgery bilateral  . LEFT HEART CATH AND CORONARY ANGIOGRAPHY N/A 03/15/2018   Procedure: LEFT HEART CATH AND CORONARY ANGIOGRAPHY;  Surgeon: Jettie Booze, MD;  Location: [place] CV LAB;  Service: Cardiovascular;  Laterality: N/A;  . PROSTATE SURGERY    . SPINE SURGERY  2009   ruptured disc  . TEE WITHOUT CARDIOVERSION N/A 04/08/2018   Procedure: TRANSESOPHAGEAL ECHOCARDIOGRAM (TEE);  Surgeon: Gaye Pollack, MD;  Location: [place];  Service: Open Heart Surgery;  Laterality: N/A;  . TONSILLECTOMY     removed as a teenager  . Visceral angiogram  05/14/2012   Right  groin area  . VISCERAL ANGIOGRAM N/A 05/14/2012   Procedure: VISCERAL ANGIOGRAM;  Surgeon: Elam Dutch, MD;  Location: [facility] CATH LAB;  Service: Cardiovascular;  Laterality: N/A;    Family History  Problem Relation Age of Onset  . Heart attack Father   . Heart disease Father        After age  23  . COPD Father   . Cancer Sister        Breast and Brain  . Heart disease Brother        before age 6  . Heart attack Brother     SOCIAL HISTORY: Social History   Socioeconomic History  . Marital status: Married    Spouse name: Not on file  . Number of children: Not on file  . Years of education: Not on file  . Highest education level: Not on file  Occupational History  . Not on file  Social Needs  . Financial resource strain: Not on file  . Food insecurity    Worry: Not on file    Inability: Not on file  . Transportation needs    Medical: Not on file     Non-medical: Not on file  Tobacco Use  . Smoking status: Former Smoker    Types: Cigarettes    Quit date: 04/29/1997    Years since quitting: 22.3  . Smokeless tobacco: Never Used  Substance and Sexual Activity  . Alcohol use: No  . Drug use: No  . Sexual activity: Not on file  Lifestyle  . Physical activity    Days per week: Not on file    Minutes per session: Not on file  . Stress: Not on file  Relationships  . Social Herbalist on phone: Not on file    Gets together: Not on file    Attends religious service: Not on file    Active member of club or organization: Not on file    Attends meetings of clubs or organizations: Not on file    Relationship status: Not on file  . Intimate partner violence    Fear of current or ex partner: Not on file    Emotionally abused: Not on file    Physically abused: Not on file    Forced sexual activity: Not on file  Other Topics Concern  . Not on file  Social History Narrative  . Not on file    Allergies  Allergen Reactions  . Ciprofloxacin Other (See Comments)    Abdominal Pain     Current Facility-Administered Medications  Medication Dose Route Frequency Provider Last Rate Last Dose  . sodium chloride flush (NS) 0.9 % injection 3 mL  3 mL Intravenous Once Tegeler, Gwenyth Allegra, MD       Current Outpatient Medications  Medication Sig Dispense Refill  . amLODipine (NORVASC) 10 MG tablet Take 0.5 tablets (5 mg total) by mouth daily. 60 tablet 1  . Glucosamine-Chondroitin (COSAMIN DS PO) Take 2 tablets by mouth daily at 12 noon.    . Magnesium 100 MG TABS Take 200 mg by mouth daily with supper.    . Melatonin 3 MG TABS Take 3 mg by mouth at bedtime.    . Multiple Vitamin (MULTIVITAMIN WITH MINERALS) TABS tablet Take 1 tablet by mouth daily at 12 noon. Centrum Silver    . oxyCODONE-acetaminophen (PERCOCET/ROXICET) 5-325 MG tablet Take 1 tablet by mouth every 8 (eight) hours as needed for moderate pain or severe pain.    .  polyethylene glycol (MIRALAX / GLYCOLAX) packet Take 17 g by mouth daily. (Patient taking differently: Take 17 g by mouth daily as needed for mild constipation. ) 14 each 0  . rosuvastatin (CRESTOR) 20 MG tablet Take 20 mg by mouth at bedtime.     . valsartan-hydrochlorothiazide (DIOVAN HCT) 160-12.5 MG tablet Take 1 tablet by  mouth daily. 30 tablet 1    ROS:   General:  No weight loss, Fever, chills  Cardiac: No recent episodes of chest pain/pressure, no shortness of breath at rest.  No shortness of breath with exertion.  Denies history of atrial fibrillation or irregular heartbeat  Pulmonary: No home oxygen, no productive cough, no hemoptysis,  No asthma or wheezing   Physical Examination  Vitals:   09/09/19 1450 09/09/19 1500 09/09/19 1530 09/09/19 1753  BP: (!) 158/76 (!) 152/88 (!) 146/69 (!) 175/70  Pulse:  (!) 57  71  Resp: (!) 22 (!) 28 18 (!) 25  Temp:      TempSrc:      SpO2:  100%  100%  Weight:      Height:        Body mass index is 22.15 kg/m.  General:  Alert and oriented, no acute distress HEENT: Normal Abdomen: Soft, tender to palpation over pulsatile mass left side of abdomen  DATA:  I reviewed the patient's images from his CT angiogram.  This shows no evidence of rupture.  Aneurysm essentially unchanged in appearance.  ASSESSMENT: Patient with known abdominal aortic aneurysm scheduled for repair on Monday now with abdominal pain.  I discussed the patient that although the CT scan did not show any evidence of rupture this could certainly be symptoms from his aneurysm which could suggest that the aneurysm is changing and could be at risk of rupture.  I encouraged him that he should probably come into the hospital but he wishes to go home instead.  He understands that I am on call during the course of the weekend and will call me back if things get worse.   PLAN: Aneurysm stent graft repair on Monday.  Discussed with patient that if he has worsening abdominal  pain he should immediately return to the hospital and he understands that he is at risk for aneurysm rupture.  [name], MD [facility] Office: [phone] Pager: [phone]

## 2019-09-09 NOTE — Progress Notes (Signed)
Anesthesia Chart Review:  Case: [id number] Date/Time: 09/12/19 0715   Procedure: ABDOMINAL AORTIC ENDOVASCULAR STENT GRAFT (N/A )   Anesthesia type: General   Pre-op diagnosis: aortic pseudoaneurysm distal anastomosis   [facility] RO[facility] / [facility]   Surgeon: Elam Dutch, MD      DISCUSSION: Patient is a 85 year old male scheduled for the above procedure. He was initially going to have procedure in 07/2016, but was given steroids to help treat thrombocytopenia, and he did not tolerate (weak with aching). 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 EVAR was felt to be increased risk due to thrombocytopenia and advanced age, and patient ultimately decided to defer EVAR. He has since undergone CABG x4 in 2019 and most recent CTA on 08/18/19 showed the pseudoaneurysm sit measuring 4.9 cm compared to 4.5 cm and the pseudoaneurysm itself measuring max 3.3 cm compared to 2.4 cm. He was felt at increased risk for rupture given increased size, so surgery recommended with preoperative cardiology and hematology input (see below).   History includes former smoker (quit 1998), AAA (s/p repair 1999 in [place]; has had surveillance imaging for pseudoaneurysm in distal graft), CAD (s/p CABG: LIMA-LAD, SVG-DIAG, SVG-OM2, SVG-AM 04/08/18), post-operative afib, aortic stenosis (very mild 04/02/18), HTN, HLD, PAD, GERD, psoriasis, carotid artery disease, skin cancer ([facility]), BPH, prostate cancer (s/p seed implant '13), MDS with thrombocytopenia, spinal surgery.   He was seen by cardiologist Dr. Irish Lack on 09/05/19 for follow-up and preoperative evaluation. No angina. No further cardiac testing needed prior to surgery. Bradycardia has been an issues, but no syncope/presyncope. Avoid rate slowing medication. Premature beats may be making his HR on his BP machine artificially seem low.   He was seen by hematologist Dr. Grayland Ormond on  09/06/19 for MDS (confirmed by bone marrow biopsy) and thrombocytopenia (secondary to MDS) follow-up. Platelet count has ranged between 37-60K since 03/2018 and has not been > 100K since 07/2013 (except for on at 154 on 04/28/18). He was given 2 mg/kg Nplate on 9/92/42, but recommended checking platelet count on the day of procedure and if he has not responded appropriately, then he will need platelet transfusion prior to and during his aneurysm repair.   Preoperative labs show a normal CMET except for total protein marked as "BETA HEMOLYTIC". H/H 12.8/38.2, PLT count 40K, up from 32K on 09/06/19. Normal PT/PTT and UA. Glucose 96.   Updated anesthesiologist Nolon Nations, MD of above. Currently plan is for repeat CBC/PLT count on 09/12/19 with plan for PLT transfusion if count not significiantly better.     VS: BP (!) (P) 150/63   Pulse (P) 69   Temp (P) 36.6 C   Resp (P) 18   Ht (P) '5\' 9"'$  (1.753 m)   SpO2 (P) 100%   BMI (P) 22.45 kg/m    PROVIDERS: [name], MD is PCP - Larae Grooms, MD is cardiologist - Delight Hoh, MD is Hematologist  - Loistine Simas, MD is GI   LABS: Preoperative labs noted. See DISCUSSION. (all labs ordered are listed, but only abnormal results are displayed)  Labs Reviewed  BLOOD GAS, ARTERIAL - Abnormal; Notable for the following components:      Result Value   pH, Arterial 7.460 (*)    All other components within normal limits  CBC - Abnormal; Notable for the following components:   RBC 4.18 (*)    Hemoglobin 12.8 (*)    HCT  38.2 (*)    Platelets 40 (*)    All other components within normal limits  SURGICAL PCR SCREEN  APTT  COMPREHENSIVE METABOLIC PANEL  PROTIME-INR  URINALYSIS, ROUTINE W REFLEX MICROSCOPIC  TYPE AND SCREEN    Spirometry 04/06/18: FVC 3.56 (103%), FEV1 2.65 (113%). DLCO unc 18.73 (60%).   IMAGES: CTA abd/pelvis 08/18/19: (See full report for additional details) IMPRESSION: VASCULAR 1. Enlarging  pseudoaneurysm at the distal anastomosis of the prior open tube graft repair of the abdominal aorta. The maximal aortic diameter at the site of the pseudoaneurysm is now 4.9 cm compared to 4.5 cm previously. The pseudoaneurysm itself now measures 3.3 x 2.9 cm compared to 2.4 x 2.2 cm. 2. Aortic aneurysm NOS (ICD10-I71.9); Aortic Atherosclerosis (ICD10-170.0) 3. Additional ancillary findings as above. NON-VASCULAR 1. Additional ancillary findings as above without significant interval change.   EKG: 09/05/19: As reviewed by Dr. Irish Lack at office visit. NSR with PACs RBBB LAFB Bifascicular block   CV: Carotid US 07/19/19: Summary: Right Carotid: Velocities in the right ICA are consistent with a 40-59%                stenosis. Left Carotid: Velocities in the left proximal to mid ICA are consistent with a               60-79% stenosis. Vertebrals:  Bilateral vertebral arteries demonstrate antegrade flow. Subclavians: Normal flow hemodynamics were seen in bilateral subclavian              arteries.  48 Hour Holter monitor 01/19/19: Study Highlights Normal sinus rhythm with occasional PACs, PVCs.  Rare episodes of short lasting supraventricular runs.  No atrial fibrillation.  Average HR 62 bpm. Continue medical therapy.  TEE 04/08/18: Final result   Aortic valve: The valve is trileaflet. Moderate valve thickening present. Severe valve calcification present. Mildly decreased leaflet separation. Aortic sclerosis without significant stenosis. No regurgitation.  Mitral valve: Non-specific thickening. Moderate leaflet thickening is present. Moderate leaflet calcification is present. Mild regurgitation.  Right ventricle: Normal cavity size, wall thickness and ejection fraction.  Tricuspid valve: Mild regurgitation.  Pulmonic valve: Mild regurgitation.    Echo (TTE) 04/02/18: Study Conclusions - Left ventricle: The cavity size was normal. Systolic function was   vigorous.  The estimated ejection fraction was in the range of 65%   to 70%. Wall motion was normal; there were no regional wall   motion abnormalities. There was an increased relative   contribution of atrial contraction to ventricular filling.   Doppler parameters are consistent with abnormal left ventricular   relaxation (grade 1 diastolic dysfunction). - Aortic valve: There was very mild stenosis. Mean gradient (S): 9   mm Hg. Valve area (VTI): 2 cm^2. Valve area (Vmax): 1.65 cm^2.   Valve area (Vmean): 1.5 cm^2. - Mitral valve: Calcified annulus. There was mild regurgitation. - Left atrium: The atrium was mildly dilated. - Pulmonic valve: There was trivial regurgitation.  Last cardiac cath was on 03/15/28 prior to CABG.   Past Medical History:  Diagnosis Date  . AAA (abdominal aortic aneurysm) ([place]) 1999   a. treated with open repair in 1999 with subsequent distal anastomotic pseudoaneurysm that is being followed by Dr. Oneida Alar.  . Adenomatous colon polyp   . Aorta aneurysm ([place])   . Arthritis   . Basal cell cancer Dec. 24, 2013   Nose  . CAD (coronary artery disease)    a. s/p CABG 03/2018.  Marland Kitchen Cancer [facility]) Aug. 2013   prostate  Seeding  implant  . Carotid artery occlusion   . Enlarged prostate 2008   Thermotherapy  . Gastroesophageal reflux disease   . GI bleed   . Hyperlipidemia   . Hypertension   . Mild aortic stenosis   . Myelodysplasia (myelodysplastic syndrome) ([place])   . Pancytopenia ([place])    sees Dr. Grayland Ormond  . Peripheral vascular disease ([place])   . Postoperative atrial fibrillation ([place])   . Psoriasis   . S/P CABG (coronary artery bypass graft)     Past Surgical History:  Procedure Laterality Date  . ABDOMINAL AORTIC ANEURYSM REPAIR  1999  . CARDIAC CATHETERIZATION  10/2009  . CHOLECYSTECTOMY    . CORONARY ARTERY BYPASS GRAFT N/A 04/08/2018   Procedure: CORONARY ARTERY BYPASS GRAFTING (CABG) x four, using left mammary artery and right leg greater saphenous vein  harvested endoscopically.;  Surgeon: Gaye Pollack, MD;  [facility];  Service: Open Heart Surgery;  Laterality: N/A;  . ESOPHAGOGASTRODUODENOSCOPY (EGD) WITH PROPOFOL N/A 06/26/2016   Procedure: ESOPHAGOGASTRODUODENOSCOPY (EGD) WITH PROPOFOL;  Surgeon: Lollie Sails, MD;  Location: [facility] ENDOSCOPY;  Service: Endoscopy;  Laterality: N/A;  . EYE SURGERY  1990   cataract surgery bilateral  . LEFT HEART CATH AND CORONARY ANGIOGRAPHY N/A 03/15/2018   Procedure: LEFT HEART CATH AND CORONARY ANGIOGRAPHY;  Surgeon: Jettie Booze, MD;  Location: [place] CV LAB;  Service: Cardiovascular;  Laterality: N/A;  . PROSTATE SURGERY    . SPINE SURGERY  2009   ruptured disc  . TEE WITHOUT CARDIOVERSION N/A 04/08/2018   Procedure: TRANSESOPHAGEAL ECHOCARDIOGRAM (TEE);  Surgeon: Gaye Pollack, MD;  Location: [place];  Service: Open Heart Surgery;  Laterality: N/A;  . TONSILLECTOMY     removed as a teenager  . Visceral angiogram  05/14/2012   Right  groin area  . VISCERAL ANGIOGRAM N/A 05/14/2012   Procedure: VISCERAL ANGIOGRAM;  Surgeon: Elam Dutch, MD;  Location: [facility] CATH LAB;  Service: Cardiovascular;  Laterality: N/A;    MEDICATIONS: . amLODipine (NORVASC) 10 MG tablet  . Glucosamine-Chondroitin (COSAMIN DS PO)  . Magnesium 100 MG TABS  . Melatonin 3 MG TABS  . Multiple Vitamin (MULTIVITAMIN WITH MINERALS) TABS tablet  . oxyCODONE-acetaminophen (PERCOCET/ROXICET) 5-325 MG tablet  . polyethylene glycol (MIRALAX / GLYCOLAX) packet  . rosuvastatin (CRESTOR) 20 MG tablet  . valsartan-hydrochlorothiazide (DIOVAN HCT) 160-12.5 MG tablet   No current facility-administered medications for this encounter.     Myra Gianotti, PA-C Surgical Short Stay/Anesthesiology [facility] Phone [phone] [facility] Phone [phone] 09/09/2019 1:35 PM

## 2019-09-09 NOTE — ED Provider Notes (Signed)
[facility] EMERGENCY DEPARTMENT Provider Note   CSN: [id number] Arrival date & time: 09/09/19  1358     History   Chief Complaint Chief Complaint  Patient presents with  . Abdominal Pain    HPI EDDIEL THAN is a 85 y.o. male with a past medical history of AAA, CAD status post CABG, myelodysplasia with thrombocytopenia, atrial fibrillation, hypertension who presents to the emergency department with left-sided abdominal pain and nausea.  Patient reports this morning he started noticing some left-sided abdominal pain with some mild nausea.  Patient reports the pain has been constant since and describes it as a dull aching feeling.  Patient reports the pain does not radiate anywhere.  Patient reports a history of constipation however he had a normal bowel movement without blood earlier today this morning.  Patient has not had any vomiting, diarrhea, fevers, or dysuria.  Patient has not noticed anything that makes the pain worse and has not tried anything for the pain at home.  Patient reports he is scheduled to have his AAA repaired on Monday and it was measuring 4.9 cm last month.  Patient denies any chest pain, shortness of breath, or dizziness or lightheadedness.  Patient reports he is not on any blood thinners.     The history is provided by the patient and a relative.    Past Medical History:  Diagnosis Date  . AAA (abdominal aortic aneurysm) ([place]) 1999   a. treated with open repair in 1999 with subsequent distal anastomotic pseudoaneurysm that is being followed by Dr. Oneida Alar.  . Adenomatous colon polyp   . Aorta aneurysm ([place])   . Arthritis   . Basal cell cancer Dec. 24, 2013   Nose  . CAD (coronary artery disease)    a. s/p CABG 03/2018.  Marland Kitchen Cancer [facility]) Aug. 2013   prostate Seeding  implant  . Carotid artery occlusion   . Enlarged prostate 2008   Thermotherapy  . Gastroesophageal reflux disease   . GI bleed   . Hyperlipidemia   . Hypertension   .  Mild aortic stenosis   . Myelodysplasia (myelodysplastic syndrome) ([place])   . Pancytopenia ([place])    sees Dr. Grayland Ormond  . Peripheral vascular disease ([place])   . Postoperative atrial fibrillation ([place])   . Psoriasis   . S/P CABG (coronary artery bypass graft)     Patient Active Problem List   Diagnosis Date Noted  . Atrial fibrillation ([place]) 04/13/2018  . S/P CABG x 4 04/08/2018  . Angina pectoris ([place])   . Arthritis 01/26/2017  . MDS (myelodysplastic syndrome) ([place]) 09/01/2016  . Thrombocytopenia ([place]) 08/17/2016  . Aneurysm of abdominal vessel ([place]) 07/23/2016  . Anemia 05/06/2016  . Benign essential hypertension 08/07/2015  . Bilateral carotid artery stenosis 08/07/2015  . CAD (coronary artery disease) 08/07/2015  . Hyperlipidemia, mixed 08/07/2015  . Personal history of other malignant neoplasm of skin 12/13/2012  . Chronic vascular insufficiency of intestine ([facility]) 05/24/2012  . Abdominal pain, right lower quadrant 05/24/2012  . Occlusion and stenosis of carotid artery without mention of cerebral infarction 04/29/2012    Past Surgical History:  Procedure Laterality Date  . ABDOMINAL AORTIC ANEURYSM REPAIR  1999  . CARDIAC CATHETERIZATION  10/2009  . CHOLECYSTECTOMY    . CORONARY ARTERY BYPASS GRAFT N/A 04/08/2018   Procedure: CORONARY ARTERY BYPASS GRAFTING (CABG) x four, using left mammary artery and right leg greater saphenous vein harvested endoscopically.;  Surgeon: Gaye Pollack, MD;  Location: [facility];  Service: Open Heart Surgery;  Laterality: N/A;  . ESOPHAGOGASTRODUODENOSCOPY (EGD) WITH PROPOFOL N/A 06/26/2016   Procedure: ESOPHAGOGASTRODUODENOSCOPY (EGD) WITH PROPOFOL;  Surgeon: Lollie Sails, MD;  Location: [facility] ENDOSCOPY;  Service: Endoscopy;  Laterality: N/A;  . EYE SURGERY  1990   cataract surgery bilateral  . LEFT HEART CATH AND CORONARY ANGIOGRAPHY N/A 03/15/2018   Procedure: LEFT HEART CATH AND CORONARY ANGIOGRAPHY;  Surgeon: Jettie Booze, MD;   Location: [facility] CV LAB;  Service: Cardiovascular;  Laterality: N/A;  . PROSTATE SURGERY    . SPINE SURGERY  2009   ruptured disc  . TEE WITHOUT CARDIOVERSION N/A 04/08/2018   Procedure: TRANSESOPHAGEAL ECHOCARDIOGRAM (TEE);  Surgeon: Gaye Pollack, MD;  Location: [facility];  Service: Open Heart Surgery;  Laterality: N/A;  . TONSILLECTOMY     removed as a teenager  . Visceral angiogram  05/14/2012   Right  groin area  . VISCERAL ANGIOGRAM N/A 05/14/2012   Procedure: VISCERAL ANGIOGRAM;  Surgeon: Elam Dutch, MD;  Location: [facility] CATH LAB;  Service: Cardiovascular;  Laterality: N/A;        Home Medications    Prior to Admission medications   Medication Sig Start Date End Date Taking? Authorizing Provider  amLODipine (NORVASC) 10 MG tablet Take 0.5 tablets (5 mg total) by mouth daily. 05/11/18  Yes Bartle, Fernande Boyden, MD  Glucosamine-Chondroitin (COSAMIN DS PO) Take 2 tablets by mouth daily at 12 noon.   Yes [provider]  Magnesium 100 MG TABS Take 200 mg by mouth daily with supper.   Yes [provider]  Melatonin 3 MG TABS Take 3 mg by mouth at bedtime.   Yes [provider]  Multiple Vitamin (MULTIVITAMIN WITH MINERALS) TABS tablet Take 1 tablet by mouth daily at 12 noon. Centrum Silver   Yes [provider]  oxyCODONE-acetaminophen (PERCOCET/ROXICET) 5-325 MG tablet Take 1 tablet by mouth every 8 (eight) hours as needed for moderate pain or severe pain.   Yes [provider]  polyethylene glycol (MIRALAX / GLYCOLAX) packet Take 17 g by mouth daily. Patient taking differently: Take 17 g by mouth daily as needed for mild constipation.  09/14/17  Yes Cuthriell, Charline Bills, PA-C  rosuvastatin (CRESTOR) 20 MG tablet Take 20 mg by mouth at bedtime.  06/30/18  Yes [provider]  valsartan-hydrochlorothiazide (DIOVAN HCT) 160-12.5 MG tablet Take 1 tablet by mouth daily. 04/14/18  Yes Elgie Collard, PA-C    Family History Family  History  Problem Relation Age of Onset  . Heart attack Father   . Heart disease Father        After age 78  . COPD Father   . Cancer Sister        Breast and Brain  . Heart disease Brother        before age 51  . Heart attack Brother     Social History Social History   Tobacco Use  . Smoking status: Former Smoker    Types: Cigarettes    Quit date: 04/29/1997    Years since quitting: 22.3  . Smokeless tobacco: Never Used  Substance Use Topics  . Alcohol use: No  . Drug use: No     Allergies   Ciprofloxacin   Review of Systems Review of Systems  Constitutional: Negative for fever.  HENT: Negative for congestion and trouble swallowing.   Eyes: Negative for visual disturbance.  Respiratory: Negative for cough and shortness of breath.   Cardiovascular: Negative for  chest pain and leg swelling.  Gastrointestinal: Positive for abdominal pain, constipation and nausea. Negative for abdominal distention, blood in stool, diarrhea and vomiting.  Genitourinary: Negative for difficulty urinating and dysuria.  Skin: Positive for rash.  Neurological: Negative for syncope, weakness, numbness and headaches.  Psychiatric/Behavioral: Negative for confusion.     Physical Exam Updated Vital Signs BP (!) 164/68   Pulse 70   Temp (!) 97.5 F (36.4 C) (Oral)   Resp 14   Ht 5\' 9"  (1.753 m)   Wt 68 kg   SpO2 98%   BMI 22.15 kg/m   Physical Exam Constitutional:      General: He is not in acute distress.    Appearance: He is not diaphoretic.  HENT:     Head: Normocephalic and atraumatic.     Right Ear: External ear normal.     Left Ear: External ear normal.     Nose: Nose normal.     Mouth/Throat:     Mouth: Mucous membranes are moist.     Pharynx: Oropharynx is clear.  Eyes:     Conjunctiva/sclera: Conjunctivae normal.  Neck:     Musculoskeletal: Neck supple.  Cardiovascular:     Rate and Rhythm: Normal rate. Rhythm irregular.  Pulmonary:     Effort: Pulmonary effort  is normal. No respiratory distress.     Breath sounds: Normal breath sounds. No wheezing, rhonchi or rales.  Chest:     Chest wall: No tenderness.  Abdominal:     Palpations: Abdomen is soft.     Tenderness: There is abdominal tenderness (L sided). There is no guarding or rebound.     Comments: Well healed left-sided surgical scar, pulsatile abdominal mass in the palpated just left of midline last from umbilicus  Musculoskeletal:     Right lower leg: No edema.     Left lower leg: No edema.  Skin:    Findings: Rash (Petechial rash on bilateral lower extremities) present.  Neurological:     Mental Status: He is alert.      ED Treatments / Results  Labs (all labs ordered are listed, but only abnormal results are displayed) Labs Reviewed  COMPREHENSIVE METABOLIC PANEL - Abnormal; Notable for the following components:      Result Value   Glucose, Bld 112 (*)    All other components within normal limits  CBC - Abnormal; Notable for the following components:   Hemoglobin 12.7 (*)    Platelets 37 (*)    All other components within normal limits  URINALYSIS, ROUTINE W REFLEX MICROSCOPIC - Abnormal; Notable for the following components:   Color, Urine STRAW (*)    Hgb urine dipstick SMALL (*)    All other components within normal limits  LIPASE, BLOOD    EKG EKG Interpretation  Date/Time:  Friday September 09 2019 14:54:23 EDT Ventricular Rate:  62 PR Interval:    QRS Duration: 172 QT Interval:  478 QTC Calculation: 486 R Axis:   -83 Text Interpretation:  Sinus rhythm Atrial premature complexes RBBB and LAFB When compared to priorl no significant changes seen.  No STEMI Confirmed by Antony Blackbird [phone]) on 09/09/2019 3:09:47 PM   Radiology Ct Angio Abd/pel W And/or Wo Contrast  Result Date: 09/09/2019 CLINICAL DATA:  85 year old male with a history of abdominal pain on the left with prior surgical abdominal aortic aneurysm repair EXAM: CTA ABDOMEN AND PELVIS wITHOUT AND  WITH CONTRAST TECHNIQUE: Multidetector CT imaging of the abdomen and pelvis was performed using  the standard protocol during bolus administration of intravenous contrast. Multiplanar reconstructed images and MIPs were obtained and reviewed to evaluate the vascular anatomy. CONTRAST:  169mL OMNIPAQUE IOHEXOL 350 MG/ML SOLN COMPARISON:  None. FINDINGS: VASCULAR Aorta: Atherosclerotic changes of the lower thoracic aorta again noted. Greatest diameter at the aortic hiatus measures 2.5 cm. Atherosclerotic changes of the abdominal aorta. Redemonstration of surgical changes of prior surgical abdominal aortic aneurysm repair with aortic graft. Redemonstration of penetrating ulcers/pseudoaneurysm at the distal anastomosis, with saccular configuration from the anterior aorta. The greatest diameter from the dome of the pseudoaneurysm to the contralateral aortic wall measures 4.8 cm. This has slowly enlarged from the CT dated July 13, 2017, when the diameter was 4.3 cm. This diameter on the comparison CT of August 18, 2019 is the same, 4.9 cm. Transverse diameter today measures 3 cm, previously 2.9 cm. There is flow within the pseudoaneurysm, with the dome partially thrombosed with mural thrombus/plaque. Similar appearance of mild edema in the adjacent fat. No extravasation of contrast identified. Celiac: No significant atherosclerotic changes at the celiac artery origin. Branches are patent. SMA: No significant atherosclerotic changes at the origin of the SMA with patent branches. Renals: The right renal artery is patent with mild atherosclerosis. The left main renal artery remains patent with mild atherosclerosis at the origin and no significant stenosis. IMA: IMA has been ligated. Branch vessels demonstrate perfusion secondary to collateral flow. Right lower extremity: Native external iliac artery with mild atherosclerosis. Moderate tortuosity. Hypogastric artery remains patent. External iliac artery is patent. Mild  atherosclerotic changes at the common femoral artery. Left lower extremity: Native left iliac artery is patent. Diameter of the common iliac artery measures 2 cm. Moderate tortuosity. Hypogastric artery remains patent with atherosclerotic changes at the origin. External iliac artery patent with mild atherosclerosis. Common femoral artery patent with mild atherosclerosis. Veins: Unremarkable appearance of the venous system. Review of the MIP images confirms the above findings. NON-VASCULAR Lower chest: No acute. Hepatobiliary: Unremarkable appearance of the liver. Cholecystectomy Pancreas: Unremarkable Spleen: Unremarkable Adrenals/Urinary Tract: Unremarkable appearance of adrenal glands. Right: No hydronephrosis. Symmetric perfusion to the left. No nephrolithiasis. Unremarkable course of the right ureter. Left: Left kidney again demonstrates inferior cortical infarction. Redemonstration of cystic changes the lower renal cortex and the posterior renal cortex. No hydronephrosis or nephrolithiasis. Unremarkable appearance of the urinary bladder . Stomach/Bowel: Hiatal hernia with otherwise unremarkable stomach. Unremarkable appearance of small bowel. No evidence of obstruction. Appendix is not visualized, however, no inflammatory changes are present adjacent to the cecum to indicate an appendicitis. Colonic diverticula without associated inflammatory changes. Lymphatic: No lymphadenopathy. Mesenteric: No free fluid or air. No adenopathy. Reproductive: Brachytherapy changes of the prostate Other: No hernia. Musculoskeletal: No acute displaced fracture. Surgical changes again demonstrated spanning L2-L4. No bony canal narrowing. Degenerative changes at L4-L5 and L5-S1. Mild degenerative changes of the hips. IMPRESSION: Redemonstration of saccular pseudoaneurysm arising from the distal anastomosis of prior aortic tube graft. The configuration/size is unchanged from the most recent CT of August 27, though has enlarged from  comparison CTs, with the current greatest maximal diameter from the dome to the contralateral aortic wall approximately 4.9 cm. Aortic Atherosclerosis (ICD10-I70.0). Additional ancillary findings as above. Signed, Dulcy Fanny. Dellia Nims, RPVI [facility] Radiology Electronically Signed   By: Corrie Mckusick D.O.   On: 09/09/2019 17:30    Procedures Procedures (including critical care time)  Medications Ordered in ED Medications  iohexol (OMNIPAQUE) 350 MG/ML injection 100 mL (  100 mLs Intravenous Contrast Given 09/09/19 1654)  alum & mag hydroxide-simeth (MAALOX/MYLANTA) 200-200-20 MG/5ML suspension 15 mL (15 mLs Oral Given 09/09/19 1838)     Initial Impression / Assessment and Plan / ED Course  I have reviewed the triage vital signs and the nursing notes.  Pertinent labs & imaging results that were available during my care of the patient were reviewed by me and considered in my medical decision making (see chart for details).        Concern for possible rupturing AAA.  Diverticulitis or other intra-abdominal pathology also on differential.  Patient hemodynamically stable and somewhat hypertensive.  Patient reports he took his antihypertensive medications this morning.  CTA of the abdomen pelvis was ordered and showed no evidence of rupture and aneurysm unchanged in appearance when compared to scan last month.  Dr. Oneida Alar with vascular surgery was consulted and evaluated patient in the emergency department.  Vascular surgery recommended admission for close monitoring and blood pressure control however after discussing risks and benefits with patient patient declined admission.  On reassessment patient remains hemodynamically stable and hypertensive.  Dr. Oneida Alar recommended systolic blood pressure less then 140.  Discussed with pharmacy who reported if patient remained hypertensive at home he could take an additional 5 mg of his amlodipine this evening.   Patient given a dose of Maalox due to question whether a component of GERD may be contributing to patient's symptoms.  Updated patient and family.  Patient is competent to make his own medical decisions and understands the risk of going home including death.  All questions answered and strict return precautions given.  Patient family comfortable with plan to discharge home and return to the emergency department if worsening symptoms.  Patient seen and plan discussed with Dr. Sherry Ruffing.  Final Clinical Impressions(s) / ED Diagnoses   Final diagnoses:  Abdominal aortic aneurysm (AAA) without rupture ([place])  Left sided abdominal pain    ED Discharge Orders    None       Betsey Amen, MD 09/10/19 0141    Tegeler, Gwenyth Allegra, MD 09/10/19 1110

## 2019-09-09 NOTE — ED Notes (Signed)
Patient transported to CT 

## 2019-09-09 NOTE — ED Notes (Signed)
Pt verbalized understanding of discharge paperwork and follow-up care.  °

## 2019-09-09 NOTE — Anesthesia Preprocedure Evaluation (Addendum)
Anesthesia Evaluation  Patient identified by MRN, date of birth, ID band Patient awake    Reviewed: Allergy & Precautions, NPO status , Patient's Chart, lab work & pertinent test results  Airway Mallampati: II  TM Distance: >3 FB Neck ROM: Full    Dental no notable dental hx.    Pulmonary neg pulmonary ROS, former smoker,    Pulmonary exam normal breath sounds clear to auscultation       Cardiovascular hypertension, + CAD, + CABG and + Peripheral Vascular Disease  Normal cardiovascular exam+ Valvular Problems/Murmurs AS  Rhythm:Regular Rate:Normal     Neuro/Psych negative neurological ROS  negative psych ROS   GI/Hepatic negative GI ROS, Neg liver ROS,   Endo/Other  negative endocrine ROS  Renal/GU negative Renal ROS  negative genitourinary   Musculoskeletal negative musculoskeletal ROS (+)   Abdominal   Peds negative pediatric ROS (+)  Hematology  (+) anemia , thrombocytopenia   Anesthesia Other Findings   Reproductive/Obstetrics negative OB ROS                            Anesthesia Physical Anesthesia Plan  ASA: III  Anesthesia Plan: General   Post-op Pain Management:    Induction: Intravenous  PONV Risk Score and Plan: 2 and Ondansetron, Dexamethasone and Treatment may vary due to age or medical condition  Airway Management Planned: Oral ETT  Additional Equipment: Arterial line  Intra-op Plan:   Post-operative Plan: Extubation in OR  Informed Consent: I have reviewed the patients History and Physical, chart, labs and discussed the procedure including the risks, benefits and alternatives for the proposed anesthesia with the patient or authorized representative who has indicated his/her understanding and acceptance.     Dental advisory given  Plan Discussed with: CRNA and Surgeon  Anesthesia Plan Comments: (See PAT note written 09/09/2019 by Myra Gianotti, PA-C. Has  cardiology and hematology input. Known thrombocytopenia. Received Nplate 09/06/19.  )       Anesthesia Quick Evaluation

## 2019-09-09 NOTE — Telephone Encounter (Signed)
New Message    Patient unable to reach Dr. Eden Lathe and would like the office to call to let them know the patient is being admitted in [facility].

## 2019-09-09 NOTE — ED Triage Notes (Signed)
Pt reports being scheduled for abdominal aortic aneurysm repair Monday am with Dr.Fields. Pt endorses nausea and abd pain that started today

## 2019-09-09 NOTE — H&P (View-Only) (Signed)
Referring Physician: [place]  Patient name: [name] MRN: [id number] DOB: 1926-07-02 Sex: male  REASON FOR CONSULT: Abdominal pain with known abdominal aortic aneurysm  HPI: [name] is a 85 y.o. male, with a known distal anastomotic pseudoaneurysm scheduled for repair with aneurysm stent graft on 09/12/2019.  Patient began experiencing left side abdominal pain earlier this morning.  He states the pain is about the same.  It has not gotten worse or better.  He had a CT angiogram in the emergency room which showed no evidence of rupture and no significant change in the aorta.  No other intra-abdominal pathology.  He has no fever or chills.  He currently has no nausea or vomiting.  He has been hemodynamically stable in the emergency room.  He had a normal bowel movement earlier today.  Past Medical History:  Diagnosis Date  . AAA (abdominal aortic aneurysm) ([place]) 1999   a. treated with open repair in 1999 with subsequent distal anastomotic pseudoaneurysm that is being followed by Dr. Oneida Alar.  . Adenomatous colon polyp   . Aorta aneurysm ([place])   . Arthritis   . Basal cell cancer Dec. 24, 2013   Nose  . CAD (coronary artery disease)    a. s/p CABG 03/2018.  Marland Kitchen Cancer [facility]) Aug. 2013   prostate Seeding  implant  . Carotid artery occlusion   . Enlarged prostate 2008   Thermotherapy  . Gastroesophageal reflux disease   . GI bleed   . Hyperlipidemia   . Hypertension   . Mild aortic stenosis   . Myelodysplasia (myelodysplastic syndrome) ([place])   . Pancytopenia ([place])    sees Dr. Grayland Ormond  . Peripheral vascular disease ([place])   . Postoperative atrial fibrillation ([place])   . Psoriasis   . S/P CABG (coronary artery bypass graft)    Past Surgical History:  Procedure Laterality Date  . ABDOMINAL AORTIC ANEURYSM REPAIR  1999  . CARDIAC CATHETERIZATION  10/2009  . CHOLECYSTECTOMY    . CORONARY ARTERY BYPASS GRAFT N/A 04/08/2018   Procedure: CORONARY ARTERY BYPASS GRAFTING  (CABG) x four, using left mammary artery and right leg greater saphenous vein harvested endoscopically.;  Surgeon: Gaye Pollack, MD;  Location: [facility] OR;  Service: Open Heart Surgery;  Laterality: N/A;  . ESOPHAGOGASTRODUODENOSCOPY (EGD) WITH PROPOFOL N/A 06/26/2016   Procedure: ESOPHAGOGASTRODUODENOSCOPY (EGD) WITH PROPOFOL;  Surgeon: Lollie Sails, MD;  Location: [facility] ENDOSCOPY;  Service: Endoscopy;  Laterality: N/A;  . EYE SURGERY  1990   cataract surgery bilateral  . LEFT HEART CATH AND CORONARY ANGIOGRAPHY N/A 03/15/2018   Procedure: LEFT HEART CATH AND CORONARY ANGIOGRAPHY;  Surgeon: Jettie Booze, MD;  Location: [place] CV LAB;  Service: Cardiovascular;  Laterality: N/A;  . PROSTATE SURGERY    . SPINE SURGERY  2009   ruptured disc  . TEE WITHOUT CARDIOVERSION N/A 04/08/2018   Procedure: TRANSESOPHAGEAL ECHOCARDIOGRAM (TEE);  Surgeon: Gaye Pollack, MD;  Location: [place];  Service: Open Heart Surgery;  Laterality: N/A;  . TONSILLECTOMY     removed as a teenager  . Visceral angiogram  05/14/2012   Right  groin area  . VISCERAL ANGIOGRAM N/A 05/14/2012   Procedure: VISCERAL ANGIOGRAM;  Surgeon: Elam Dutch, MD;  Location: [facility] CATH LAB;  Service: Cardiovascular;  Laterality: N/A;    Family History  Problem Relation Age of Onset  . Heart attack Father   . Heart disease Father        After age  4  . COPD Father   . Cancer Sister        Breast and Brain  . Heart disease Brother        before age 57  . Heart attack Brother     SOCIAL HISTORY: Social History   Socioeconomic History  . Marital status: Married    Spouse name: Not on file  . Number of children: Not on file  . Years of education: Not on file  . Highest education level: Not on file  Occupational History  . Not on file  Social Needs  . Financial resource strain: Not on file  . Food insecurity    Worry: Not on file    Inability: Not on file  . Transportation needs    Medical: Not on file     Non-medical: Not on file  Tobacco Use  . Smoking status: Former Smoker    Types: Cigarettes    Quit date: 04/29/1997    Years since quitting: 22.3  . Smokeless tobacco: Never Used  Substance and Sexual Activity  . Alcohol use: No  . Drug use: No  . Sexual activity: Not on file  Lifestyle  . Physical activity    Days per week: Not on file    Minutes per session: Not on file  . Stress: Not on file  Relationships  . Social Herbalist on phone: Not on file    Gets together: Not on file    Attends religious service: Not on file    Active member of club or organization: Not on file    Attends meetings of clubs or organizations: Not on file    Relationship status: Not on file  . Intimate partner violence    Fear of current or ex partner: Not on file    Emotionally abused: Not on file    Physically abused: Not on file    Forced sexual activity: Not on file  Other Topics Concern  . Not on file  Social History Narrative  . Not on file    Allergies  Allergen Reactions  . Ciprofloxacin Other (See Comments)    Abdominal Pain     Current Facility-Administered Medications  Medication Dose Route Frequency Provider Last Rate Last Dose  . sodium chloride flush (NS) 0.9 % injection 3 mL  3 mL Intravenous Once Tegeler, Gwenyth Allegra, MD       Current Outpatient Medications  Medication Sig Dispense Refill  . amLODipine (NORVASC) 10 MG tablet Take 0.5 tablets (5 mg total) by mouth daily. 60 tablet 1  . Glucosamine-Chondroitin (COSAMIN DS PO) Take 2 tablets by mouth daily at 12 noon.    . Magnesium 100 MG TABS Take 200 mg by mouth daily with supper.    . Melatonin 3 MG TABS Take 3 mg by mouth at bedtime.    . Multiple Vitamin (MULTIVITAMIN WITH MINERALS) TABS tablet Take 1 tablet by mouth daily at 12 noon. Centrum Silver    . oxyCODONE-acetaminophen (PERCOCET/ROXICET) 5-325 MG tablet Take 1 tablet by mouth every 8 (eight) hours as needed for moderate pain or severe pain.    .  polyethylene glycol (MIRALAX / GLYCOLAX) packet Take 17 g by mouth daily. (Patient taking differently: Take 17 g by mouth daily as needed for mild constipation. ) 14 each 0  . rosuvastatin (CRESTOR) 20 MG tablet Take 20 mg by mouth at bedtime.     . valsartan-hydrochlorothiazide (DIOVAN HCT) 160-12.5 MG tablet Take 1 tablet by  mouth daily. 30 tablet 1    ROS:   General:  No weight loss, Fever, chills  Cardiac: No recent episodes of chest pain/pressure, no shortness of breath at rest.  No shortness of breath with exertion.  Denies history of atrial fibrillation or irregular heartbeat  Pulmonary: No home oxygen, no productive cough, no hemoptysis,  No asthma or wheezing   Physical Examination  Vitals:   09/09/19 1450 09/09/19 1500 09/09/19 1530 09/09/19 1753  BP: (!) 158/76 (!) 152/88 (!) 146/69 (!) 175/70  Pulse:  (!) 57  71  Resp: (!) 22 (!) 28 18 (!) 25  Temp:      TempSrc:      SpO2:  100%  100%  Weight:      Height:        Body mass index is 22.15 kg/m.  General:  Alert and oriented, no acute distress HEENT: Normal Abdomen: Soft, tender to palpation over pulsatile mass left side of abdomen  DATA:  I reviewed the patient's images from his CT angiogram.  This shows no evidence of rupture.  Aneurysm essentially unchanged in appearance.  ASSESSMENT: Patient with known abdominal aortic aneurysm scheduled for repair on Monday now with abdominal pain.  I discussed the patient that although the CT scan did not show any evidence of rupture this could certainly be symptoms from his aneurysm which could suggest that the aneurysm is changing and could be at risk of rupture.  I encouraged him that he should probably come into the hospital but he wishes to go home instead.  He understands that I am on call during the course of the weekend and will call me back if things get worse.   PLAN: Aneurysm stent graft repair on Monday.  Discussed with patient that if he has worsening abdominal  pain he should immediately return to the hospital and he understands that he is at risk for aneurysm rupture.  [name], MD [facility] [place] Office: [phone] Pager: [phone]

## 2019-09-09 NOTE — Telephone Encounter (Signed)
Left message for patient to call back  

## 2019-09-10 ENCOUNTER — Other Ambulatory Visit (HOSPITAL_COMMUNITY)
Admission: RE | Admit: 2019-09-10 | Discharge: 2019-09-10 | Disposition: A | Discharge status: Home / Self Care | Payer: Medicare Other | Source: Ambulatory Visit | Attending: Vascular Surgery | Admitting: Vascular Surgery

## 2019-09-10 DIAGNOSIS — Z01812 Encounter for preprocedural laboratory examination: Secondary | ICD-10-CM | POA: Insufficient documentation

## 2019-09-10 DIAGNOSIS — Z20828 Contact with and (suspected) exposure to other viral communicable diseases: Secondary | ICD-10-CM | POA: Insufficient documentation

## 2019-09-11 LAB — NOVEL CORONAVIRUS, NAA (HOSP ORDER, SEND-OUT TO REF LAB; TAT 18-24 HRS): SARS-CoV-2, NAA: NOT DETECTED

## 2019-09-12 ENCOUNTER — Inpatient Hospital Stay (HOSPITAL_COMMUNITY): Payer: Medicare Other

## 2019-09-12 ENCOUNTER — Encounter (HOSPITAL_COMMUNITY)
Admission: RE | Disposition: A | Discharge status: Home / Self Care | Payer: Self-pay | Source: Home / Self Care | Attending: Vascular Surgery

## 2019-09-12 ENCOUNTER — Encounter (HOSPITAL_COMMUNITY): Payer: Self-pay

## 2019-09-12 ENCOUNTER — Inpatient Hospital Stay (HOSPITAL_COMMUNITY)
Admission: RE | Admit: 2019-09-12 | Discharge: 2019-09-13 | DRG: 269 | Disposition: A | Discharge status: Home / Self Care | Payer: Medicare Other | Attending: Vascular Surgery | Admitting: Vascular Surgery

## 2019-09-12 ENCOUNTER — Inpatient Hospital Stay (HOSPITAL_COMMUNITY): Payer: Medicare Other | Admitting: Vascular Surgery

## 2019-09-12 ENCOUNTER — Other Ambulatory Visit: Payer: Self-pay

## 2019-09-12 DIAGNOSIS — Z951 Presence of aortocoronary bypass graft: Secondary | ICD-10-CM | POA: Diagnosis not present

## 2019-09-12 DIAGNOSIS — E782 Mixed hyperlipidemia: Secondary | ICD-10-CM | POA: Diagnosis present

## 2019-09-12 DIAGNOSIS — Z8679 Personal history of other diseases of the circulatory system: Secondary | ICD-10-CM

## 2019-09-12 DIAGNOSIS — Z825 Family history of asthma and other chronic lower respiratory diseases: Secondary | ICD-10-CM

## 2019-09-12 DIAGNOSIS — Z8249 Family history of ischemic heart disease and other diseases of the circulatory system: Secondary | ICD-10-CM | POA: Diagnosis not present

## 2019-09-12 DIAGNOSIS — I2510 Atherosclerotic heart disease of native coronary artery without angina pectoris: Secondary | ICD-10-CM | POA: Diagnosis present

## 2019-09-12 DIAGNOSIS — M1990 Unspecified osteoarthritis, unspecified site: Secondary | ICD-10-CM | POA: Diagnosis present

## 2019-09-12 DIAGNOSIS — D696 Thrombocytopenia, unspecified: Secondary | ICD-10-CM | POA: Diagnosis present

## 2019-09-12 DIAGNOSIS — Z803 Family history of malignant neoplasm of breast: Secondary | ICD-10-CM | POA: Diagnosis not present

## 2019-09-12 DIAGNOSIS — I10 Essential (primary) hypertension: Secondary | ICD-10-CM | POA: Diagnosis present

## 2019-09-12 DIAGNOSIS — I739 Peripheral vascular disease, unspecified: Secondary | ICD-10-CM | POA: Diagnosis present

## 2019-09-12 DIAGNOSIS — I714 Abdominal aortic aneurysm, without rupture: Secondary | ICD-10-CM | POA: Diagnosis present

## 2019-09-12 DIAGNOSIS — Z87891 Personal history of nicotine dependence: Secondary | ICD-10-CM | POA: Diagnosis not present

## 2019-09-12 DIAGNOSIS — I719 Aortic aneurysm of unspecified site, without rupture: Secondary | ICD-10-CM | POA: Diagnosis present

## 2019-09-12 DIAGNOSIS — Z9889 Other specified postprocedural states: Secondary | ICD-10-CM

## 2019-09-12 DIAGNOSIS — Z20828 Contact with and (suspected) exposure to other viral communicable diseases: Secondary | ICD-10-CM | POA: Diagnosis present

## 2019-09-12 HISTORY — PX: ABDOMINAL AORTIC ENDOVASCULAR STENT GRAFT: SHX5707

## 2019-09-12 LAB — PREPARE RBC (CROSSMATCH)

## 2019-09-12 LAB — CBC
HCT: 35.8 % — ABNORMAL LOW (ref 39.0–52.0)
HCT: 31.7 % — ABNORMAL LOW (ref 39.0–52.0)
Hemoglobin: 11.8 g/dL — ABNORMAL LOW (ref 13.0–17.0)
Hemoglobin: 10.8 g/dL — ABNORMAL LOW (ref 13.0–17.0)
MCH: 29.7 pg (ref 26.0–34.0)
MCH: 30.3 pg (ref 26.0–34.0)
MCHC: 33 g/dL (ref 30.0–36.0)
MCHC: 34.1 g/dL (ref 30.0–36.0)
MCV: 90.2 fL (ref 80.0–100.0)
MCV: 89 fL (ref 80.0–100.0)
Platelets: 35 10*3/uL — ABNORMAL LOW (ref 150–400)
Platelets: 56 10*3/uL — ABNORMAL LOW (ref 150–400)
RBC: 3.97 MIL/uL — ABNORMAL LOW (ref 4.22–5.81)
RBC: 3.56 MIL/uL — ABNORMAL LOW (ref 4.22–5.81)
RDW: 13.4 % (ref 11.5–15.5)
RDW: 13.6 % (ref 11.5–15.5)
WBC: 3.7 10*3/uL — ABNORMAL LOW (ref 4.0–10.5)
WBC: 6.9 10*3/uL (ref 4.0–10.5)
nRBC: 0 % (ref 0.0–0.2)
nRBC: 0 % (ref 0.0–0.2)

## 2019-09-12 LAB — BASIC METABOLIC PANEL
Anion gap: 8 (ref 5–15)
BUN: 16 mg/dL (ref 8–23)
CO2: 27 mmol/L (ref 22–32)
Calcium: 8.6 mg/dL — ABNORMAL LOW (ref 8.9–10.3)
Chloride: 105 mmol/L (ref 98–111)
Creatinine, Ser: 0.73 mg/dL (ref 0.61–1.24)
GFR calc Af Amer: >60 mL/min (ref >60)
GFR calc non Af Amer: >60 mL/min (ref >60)
Glucose, Bld: 140 mg/dL — ABNORMAL HIGH (ref 70–99)
Potassium: 3.3 mmol/L — ABNORMAL LOW (ref 3.5–5.1)
Sodium: 140 mmol/L (ref 135–145)

## 2019-09-12 LAB — PROTIME-INR
INR: 1.2 (ref 0.8–1.2)
Prothrombin Time: 15.4 seconds — ABNORMAL HIGH (ref 11.4–15.2)

## 2019-09-12 LAB — APTT: aPTT: 50 seconds — ABNORMAL HIGH (ref 24–36)

## 2019-09-12 LAB — MAGNESIUM: Magnesium: 2 mg/dL (ref 1.7–2.4)

## 2019-09-12 SURGERY — INSERTION, ENDOVASCULAR STENT GRAFT, AORTA, ABDOMINAL
Anesthesia: General

## 2019-09-12 MED ORDER — 0.9 % SODIUM CHLORIDE (POUR BTL) OPTIME
TOPICAL | Status: DC | PRN
  Administered 2019-09-12: 10:00:00 1000 mL

## 2019-09-12 MED ORDER — ONDANSETRON HCL 4 MG/2ML IJ SOLN: 4.0000 mg | Freq: Four times a day (QID) | INTRAMUSCULAR | Status: DC | PRN

## 2019-09-12 MED ORDER — ROSUVASTATIN CALCIUM 20 MG PO TABS: 20.0000 mg | ORAL_TABLET | Freq: Every day | ORAL | Status: DC

## 2019-09-12 MED ORDER — ACETAMINOPHEN 325 MG RE SUPP: 325.0000 mg | RECTAL | Status: DC | PRN

## 2019-09-12 MED ORDER — PROPOFOL 10 MG/ML IV BOLUS
INTRAVENOUS | Status: AC
  Filled 2019-09-12: qty 20

## 2019-09-12 MED ORDER — LABETALOL HCL 5 MG/ML IV SOLN
10.0000 mg | INTRAVENOUS | Status: DC | PRN
  Administered 2019-09-12: 10 mg via INTRAVENOUS
  Filled 2019-09-12: qty 4

## 2019-09-12 MED ORDER — ONDANSETRON HCL 4 MG/2ML IJ SOLN
INTRAMUSCULAR | Status: AC
  Filled 2019-09-12: qty 2

## 2019-09-12 MED ORDER — FENTANYL CITRATE (PF) 250 MCG/5ML IJ SOLN
INTRAMUSCULAR | Status: AC
  Filled 2019-09-12: qty 5

## 2019-09-12 MED ORDER — LIDOCAINE 2% (20 MG/ML) 5 ML SYRINGE
INTRAMUSCULAR | Status: DC | PRN
  Administered 2019-09-12: 100 mg via INTRAVENOUS

## 2019-09-12 MED ORDER — SODIUM CHLORIDE 0.9 % IV SOLN: INTRAVENOUS | Status: DC

## 2019-09-12 MED ORDER — CEFAZOLIN SODIUM-DEXTROSE 2-4 GM/100ML-% IV SOLN
2.0000 g | Freq: Three times a day (TID) | INTRAVENOUS | Status: AC
  Administered 2019-09-12 (×2): 2 g via INTRAVENOUS
  Filled 2019-09-12 (×3): qty 100

## 2019-09-12 MED ORDER — PANTOPRAZOLE SODIUM 40 MG PO TBEC
40.0000 mg | DELAYED_RELEASE_TABLET | Freq: Every day | ORAL | Status: DC
  Administered 2019-09-12 – 2019-09-13 (×2): 40 mg via ORAL
  Filled 2019-09-12 (×2): qty 1

## 2019-09-12 MED ORDER — LACTATED RINGERS IV SOLN
INTRAVENOUS | Status: DC | PRN
  Administered 2019-09-12 (×2): via INTRAVENOUS

## 2019-09-12 MED ORDER — INFLUENZA VAC A&B SA ADJ QUAD 0.5 ML IM PRSY
0.5000 mL | PREFILLED_SYRINGE | INTRAMUSCULAR | Status: DC
  Filled 2019-09-12: qty 0.5

## 2019-09-12 MED ORDER — MAGNESIUM SULFATE 2 GM/50ML IV SOLN: 2.0000 g | Freq: Every day | INTRAVENOUS | Status: DC | PRN

## 2019-09-12 MED ORDER — GUAIFENESIN-DM 100-10 MG/5ML PO SYRP: 15.0000 mL | ORAL_SOLUTION | ORAL | Status: DC | PRN

## 2019-09-12 MED ORDER — ONDANSETRON HCL 4 MG/2ML IJ SOLN
INTRAMUSCULAR | Status: DC | PRN
  Administered 2019-09-12: 4 mg via INTRAVENOUS

## 2019-09-12 MED ORDER — OXYCODONE-ACETAMINOPHEN 5-325 MG PO TABS
1.0000 | ORAL_TABLET | ORAL | Status: DC | PRN
  Administered 2019-09-12 (×2): 2 via ORAL
  Filled 2019-09-12 (×3): qty 2

## 2019-09-12 MED ORDER — LABETALOL HCL 5 MG/ML IV SOLN
INTRAVENOUS | Status: AC
  Filled 2019-09-12: qty 4

## 2019-09-12 MED ORDER — CHLORHEXIDINE GLUCONATE CLOTH 2 % EX PADS: 6.0000 | MEDICATED_PAD | Freq: Once | CUTANEOUS | Status: DC

## 2019-09-12 MED ORDER — DEXAMETHASONE SODIUM PHOSPHATE 10 MG/ML IJ SOLN
INTRAMUSCULAR | Status: AC
  Filled 2019-09-12: qty 1

## 2019-09-12 MED ORDER — SUCCINYLCHOLINE 20MG/ML (10ML) SYRINGE FOR MEDFUSION PUMP - OPTIME
INTRAMUSCULAR | Status: DC | PRN
  Administered 2019-09-12: 40 mg via INTRAVENOUS

## 2019-09-12 MED ORDER — HYDRALAZINE HCL 20 MG/ML IJ SOLN
5.0000 mg | INTRAMUSCULAR | Status: DC | PRN
  Filled 2019-09-12: qty 0.25

## 2019-09-12 MED ORDER — SODIUM CHLORIDE 0.9% IV SOLUTION: Freq: Once | INTRAVENOUS | Status: DC

## 2019-09-12 MED ORDER — SODIUM CHLORIDE 0.9 % IV SOLN
INTRAVENOUS | Status: DC | PRN
  Administered 2019-09-12: 500 mL

## 2019-09-12 MED ORDER — CEFAZOLIN SODIUM-DEXTROSE 2-4 GM/100ML-% IV SOLN
2.0000 g | INTRAVENOUS | Status: AC
  Administered 2019-09-12: 2 g via INTRAVENOUS

## 2019-09-12 MED ORDER — ACETAMINOPHEN 325 MG PO TABS: 325.0000 mg | ORAL_TABLET | ORAL | Status: DC | PRN

## 2019-09-12 MED ORDER — AMLODIPINE BESYLATE 5 MG PO TABS
5.0000 mg | ORAL_TABLET | Freq: Every day | ORAL | Status: DC
  Administered 2019-09-13: 08:00:00 5 mg via ORAL
  Filled 2019-09-12: qty 1

## 2019-09-12 MED ORDER — ALUM & MAG HYDROXIDE-SIMETH 200-200-20 MG/5ML PO SUSP: 15.0000 mL | ORAL | Status: DC | PRN

## 2019-09-12 MED ORDER — PHENYLEPHRINE HCL-NACL 10-0.9 MG/250ML-% IV SOLN
INTRAVENOUS | Status: AC
  Filled 2019-09-12: qty 750

## 2019-09-12 MED ORDER — LIDOCAINE 2% (20 MG/ML) 5 ML SYRINGE
INTRAMUSCULAR | Status: AC
  Filled 2019-09-12: qty 5

## 2019-09-12 MED ORDER — IRBESARTAN 150 MG PO TABS
150.0000 mg | ORAL_TABLET | Freq: Every day | ORAL | Status: DC
  Administered 2019-09-12 – 2019-09-13 (×2): 150 mg via ORAL
  Filled 2019-09-12 (×2): qty 1

## 2019-09-12 MED ORDER — POTASSIUM CHLORIDE CRYS ER 20 MEQ PO TBCR: 20.0000 meq | EXTENDED_RELEASE_TABLET | Freq: Every day | ORAL | Status: DC | PRN

## 2019-09-12 MED ORDER — METOPROLOL TARTRATE 5 MG/5ML IV SOLN
2.0000 mg | INTRAVENOUS | Status: DC | PRN
  Filled 2019-09-12: qty 5

## 2019-09-12 MED ORDER — GLYCOPYRROLATE PF 0.2 MG/ML IJ SOSY
PREFILLED_SYRINGE | INTRAMUSCULAR | Status: DC | PRN
  Administered 2019-09-12: .2 mg via INTRAVENOUS

## 2019-09-12 MED ORDER — HEPARIN SODIUM (PORCINE) 1000 UNIT/ML IJ SOLN
INTRAMUSCULAR | Status: DC | PRN
  Administered 2019-09-12: 7000 [IU] via INTRAVENOUS

## 2019-09-12 MED ORDER — ROCURONIUM BROMIDE 10 MG/ML (PF) SYRINGE
PREFILLED_SYRINGE | INTRAVENOUS | Status: AC
  Filled 2019-09-12: qty 10

## 2019-09-12 MED ORDER — PROMETHAZINE HCL 25 MG/ML IJ SOLN: 6.2500 mg | INTRAMUSCULAR | Status: DC | PRN

## 2019-09-12 MED ORDER — PHENOL 1.4 % MT LIQD: 1.0000 | OROMUCOSAL | Status: DC | PRN

## 2019-09-12 MED ORDER — FENTANYL CITRATE (PF) 100 MCG/2ML IJ SOLN
INTRAMUSCULAR | Status: DC | PRN
  Administered 2019-09-12: 50 ug via INTRAVENOUS
  Administered 2019-09-12: 25 ug via INTRAVENOUS

## 2019-09-12 MED ORDER — DOCUSATE SODIUM 100 MG PO CAPS
100.0000 mg | ORAL_CAPSULE | Freq: Every day | ORAL | Status: DC
  Administered 2019-09-13: 08:00:00 100 mg via ORAL
  Filled 2019-09-12: qty 1

## 2019-09-12 MED ORDER — ASPIRIN EC 81 MG PO TBEC: 81.0000 mg | DELAYED_RELEASE_TABLET | Freq: Every day | ORAL | Status: DC

## 2019-09-12 MED ORDER — MORPHINE SULFATE (PF) 2 MG/ML IV SOLN: 2.0000 mg | INTRAVENOUS | Status: DC | PRN

## 2019-09-12 MED ORDER — FENTANYL CITRATE (PF) 100 MCG/2ML IJ SOLN: 25.0000 ug | INTRAMUSCULAR | Status: DC | PRN

## 2019-09-12 MED ORDER — HYDROCHLOROTHIAZIDE 12.5 MG PO CAPS
12.5000 mg | ORAL_CAPSULE | Freq: Every day | ORAL | Status: DC
  Administered 2019-09-12 – 2019-09-13 (×2): 12.5 mg via ORAL
  Filled 2019-09-12 (×2): qty 1

## 2019-09-12 MED ORDER — DEXAMETHASONE SODIUM PHOSPHATE 10 MG/ML IJ SOLN
INTRAMUSCULAR | Status: DC | PRN
  Administered 2019-09-12: 5 mg via INTRAVENOUS

## 2019-09-12 MED ORDER — PROTAMINE SULFATE 10 MG/ML IV SOLN
INTRAVENOUS | Status: DC | PRN
  Administered 2019-09-12: 70 mg via INTRAVENOUS

## 2019-09-12 MED ORDER — VALSARTAN-HYDROCHLOROTHIAZIDE 160-12.5 MG PO TABS: 1.0000 | ORAL_TABLET | Freq: Every day | ORAL | Status: DC

## 2019-09-12 MED ORDER — SODIUM CHLORIDE 0.9 % IV SOLN: 500.0000 mL | Freq: Once | INTRAVENOUS | Status: DC | PRN

## 2019-09-12 MED ORDER — SODIUM CHLORIDE 0.9 % IV SOLN
INTRAVENOUS | Status: AC
  Filled 2019-09-12: qty 1.2

## 2019-09-12 MED ORDER — PROPOFOL 10 MG/ML IV BOLUS
INTRAVENOUS | Status: DC | PRN
  Administered 2019-09-12: 50 mg via INTRAVENOUS

## 2019-09-12 MED ORDER — SODIUM CHLORIDE 0.9 % IV SOLN
INTRAVENOUS | Status: DC | PRN
  Administered 2019-09-12: 25 ug/min via INTRAVENOUS

## 2019-09-12 MED ORDER — IODIXANOL 320 MG/ML IV SOLN
INTRAVENOUS | Status: DC | PRN
  Administered 2019-09-12: 10:00:00 135 mL via INTRA_ARTERIAL

## 2019-09-12 MED ORDER — HEPARIN SODIUM (PORCINE) 1000 UNIT/ML IJ SOLN
INTRAMUSCULAR | Status: AC
  Filled 2019-09-12: qty 1

## 2019-09-12 SURGICAL SUPPLY — 52 items
CANISTER SUCT 3000ML PPV (MISCELLANEOUS) ×2 IMPLANT
CATH BEACON 5.038 65CM KMP-01 (CATHETERS) ×2 IMPLANT
CATH OMNI FLUSH .035X70CM (CATHETERS) ×2 IMPLANT
COVER WAND RF STERILE (DRAPES) ×2 IMPLANT
DERMABOND ADVANCED (GAUZE/BANDAGES/DRESSINGS) ×2
DERMABOND ADVANCED .7 DNX12 (GAUZE/BANDAGES/DRESSINGS) ×1 IMPLANT
DEVICE CLOSURE PERCLS PRGLD 6F (VASCULAR PRODUCTS) IMPLANT
DEVICE TORQUE KENDALL .025-038 (MISCELLANEOUS) IMPLANT
DRSG TEGADERM 2-3/8X2-3/4 SM (GAUZE/BANDAGES/DRESSINGS) ×4 IMPLANT
DRYSEAL FLEXSHEATH 12FR 33CM (SHEATH) ×1
DRYSEAL FLEXSHEATH 18FR 33CM (SHEATH) ×1
ELECT REM PT RETURN 9FT ADLT (ELECTROSURGICAL) ×4
ELECTRODE REM PT RTRN 9FT ADLT (ELECTROSURGICAL) ×2 IMPLANT
EXCLUDER TNK LEG 35MX14X14 (Endovascular Graft) IMPLANT
EXCLUDER TRUNK LEG 35MX14X14 (Endovascular Graft) ×2 IMPLANT
GLOVE BIO SURGEON STRL SZ7.5 (GLOVE) ×2 IMPLANT
GOWN STRL REUS W/ TWL LRG LVL3 (GOWN DISPOSABLE) ×3 IMPLANT
GOWN STRL REUS W/TWL LRG LVL3 (GOWN DISPOSABLE) ×3
GRAFT BALLN CATH 65CM (STENTS) ×1 IMPLANT
GUIDEWIRE ANGLED .035X150CM (WIRE) IMPLANT
KIT BASIN OR (CUSTOM PROCEDURE TRAY) ×2 IMPLANT
KIT TURNOVER KIT B (KITS) ×2 IMPLANT
LEG CONTRALATERAL 16X16X13.5 (Endovascular Graft) ×1 IMPLANT
LEG CONTRALATERAL 36X4.5 (Permanent Stent) ×1 IMPLANT
LEG CONTRALETERAL16X16X11.5 (Endovascular Graft) ×1 IMPLANT
NDL PERC 18GX7CM (NEEDLE) IMPLANT
NEEDLE PERC 18GX7CM (NEEDLE) IMPLANT
NS IRRIG 1000ML POUR BTL (IV SOLUTION) ×2 IMPLANT
PACK ENDOVASCULAR (PACKS) ×2 IMPLANT
PAD ARMBOARD 7.5X6 YLW CONV (MISCELLANEOUS) ×4 IMPLANT
PERCLOSE PROGLIDE 6F (VASCULAR PRODUCTS) ×10
SET MICROPUNCTURE 5F STIFF (MISCELLANEOUS) ×2 IMPLANT
SHEATH BRITE TIP 8FR 23CM (SHEATH) ×2 IMPLANT
SHEATH DRYSEAL FLEX 12FR 33CM (SHEATH) IMPLANT
SHEATH DRYSEAL FLEX 18FR 33CM (SHEATH) IMPLANT
SHEATH PINNACLE 8F 10CM (SHEATH) ×2 IMPLANT
STENT GRAFT BALLN CATH 65CM (STENTS) ×1
STENT GRAFT CONTRALAT 16X11.5 (Endovascular Graft) IMPLANT
STENT GRAFT CONTRALAT 16X13.5 (Endovascular Graft) IMPLANT
STOPCOCK MORSE 400PSI 3WAY (MISCELLANEOUS) ×2 IMPLANT
SUT PROLENE 5 0 C 1 24 (SUTURE) IMPLANT
SUT VIC AB 2-0 SH 27 (SUTURE)
SUT VIC AB 2-0 SH 27XBRD (SUTURE) IMPLANT
SUT VIC AB 3-0 SH 27 (SUTURE)
SUT VIC AB 3-0 SH 27X BRD (SUTURE) IMPLANT
SUT VICRYL 4-0 PS2 18IN ABS (SUTURE) ×4 IMPLANT
SYR 20ML LL LF (SYRINGE) ×2 IMPLANT
TOWEL GREEN STERILE (TOWEL DISPOSABLE) ×2 IMPLANT
TRAY FOLEY MTR SLVR 16FR STAT (SET/KITS/TRAYS/PACK) ×2 IMPLANT
TUBING HIGH PRESSURE 120CM (CONNECTOR) ×2 IMPLANT
WIRE AMPLATZ SS-J .035X180CM (WIRE) ×4 IMPLANT
WIRE BENTSON .035X145CM (WIRE) ×4 IMPLANT

## 2019-09-12 NOTE — Op Note (Addendum)
Procedure: Repair of infrarenal abdominal aorta distal anastomotic pseudoaneurysm with Gore Excluder stent graft  Preoperative diagnosis: Distal anastomotic pseudoaneurysm status post prior abdominal aortic aneurysm repair.  Postoperative diagnosis:  Assistant: Arlee Muslim, PA-C  Operative findings:   #1 Bilateral Proglide closure   #2 35x14x14 cm main body Gore Excluder device delivered via a right femoral system   #3 16 x 9.5 cm right iliac limb ipsilateral    #4 36 x 4.5 proximal cuff   #5 16 x14 contralateral iliac extension left  PROCEDURE DETAIL: After obtaining informed consent the patient was taken to the operating room. The patient was placed in supine position the operating room table. After induction of general anesthesia and endotracheal intubation a Foley catheter was placed. Next the patient was prepped and draped in usual sterile fashion from the nipples down to the knees. Ultrasound was used to identify the right common femoral artery as well as the femoral bifurcation. An 11 blade was used to make a small neck in the skin over the level of the right common femoral artery. An introducer needle was then used to cannulate the right common femoral artery without difficulty. A 0.035 Bentson wire was then threaded up into the abdominal aorta through the right femoral system. A short 9 French dilator was placed over the guidewire the right femoral system. This was used to dilate the tract. The dilator was then removed and a Proglide device inserted over the guidewire into the right femoral system and this was deployed at the 2:00 position. The Proglide device was then removed and an additional Proglide device was brought in operative field and deployed at the 10:00 position. The sutures were kept separate and tagged with suture tags. Next the short 9 French sheath was then placed back over the guidewire into the right femoral system and the dilator removed and the sheath thoroughly  flushed with heparinized saline. Attention was then turned to the left groin. Again using ultrasound the left common femoral artery was identified. The femoral bifurcation was also identified. A small nick was made in the skin with the 11 blade. A hemostat was used to dilate a tract down to the artery. An introducer needle was then used to cannulate the left common femoral artery without difficulty. A 0.035 Bentson wire was then threaded up into the abdominal aorta under fluoroscopic guidance. A 9 French dilator was then placed over the wire to dilate the tract. Two Proglide devices were again brought on operative field and these were fired sequentially in the 10:00 position followed by an additional Proglide at the 2:00 position. The Proglide delivery systems were removed and the long 9 French sheath placed back over the guidewire up to the level of the iliac of the aortic bifurcation on the left side.  At this point, a 5 [place] Omni flush catheter was placed over the guidewire in the left groin up and the abdominal aorta. An abdominal aortogram was obtained in the AP position with some slight cranial and LAO angulation of 15 degrees to determine level of the left and right renal arteries. At this point a 35 x 14 x 14 Gore Excluder main body device was selected. The Bentson wire from the right groin was advanced up into the descending thoracic aorta over a Kumpe catheter and the Bentsen wire replaced with an 035 Amplatz wire.  An 50 French dry seal was placed over this.  A 12 French dry seal was placed in the left groin in similar fashion.  The main body device was then placed over the Amplatz wire in the right groin and advanced up to the level of the renal arteries. Magnified views of the renal arteries were performed to make sure that we were not covering these. The top portion of the stent graft was then deployed with the end of the stent just below the level of the left renal artery and this came over to  just below the level of the right renal artery. The main body was delivered all the way down to the contralateral gate. Attention was then turned to the left groin. The Omni flush catheter was pulled down over a guidewire and removed and the Bentson wire placed in position to cannulate the contralateral gate. The Kumpe catheter was placed back over the guidewire in the left femoral system. A 5 [place] Kumpe catheter was placed over this and this was used to selectively catheterize the contralateral gate and the guidewire was then advanced into the descending thoracic aorta. The main body portion of the gate was confirmed by twirling the pigtail catheter. The pigtail  catheter was then placed in a location so that we could use its markers to determine the exact length to the iliac bifurcation. An Amplatz wire was placed back through the pigtail catheter. A retrograde contrast angiogram was performed to determine the level of the left internal iliac artery and a 16 x 13.5 cm iliac limb was selected. The pigtail catheter was removed over the guidewire and the 16 x 13.5 cm limb advanced so there was full coverage of the long marker on the contralateral limb. This was then deployed in the usual fashion down to the iliac bifurcation. The delivery system was then removed. The remainder of the ipsilateral iliac limb was also deployed.  A retrograde contrast angiogram was also performed to make sure that the right iliac limb did not cover the right internal iliac artery.  Measurement was made of the right iliac bifurcation and a 16 x10 cm ipsilateral limb was placed.  Attention was then turned back to the left iliac system and a Gore aortic balloon placed over the wire up to the level of the proximal end of the stent and this was ballooned to profile. The limb attachment site was also ballooned as well as the distal attachment site. Attention was then turned to the right groin and the balloon was advanced over the guidewire  on the right side and the distal attachment site as well as the midportion of iliac limb were also ballooned. The 5 [place] Omni flush catheter was then placed back to the guidewire on the right side and a completion arteriogram was obtained. This showed what appeared to be a very slow type I proximal leak or a type II endoleak from an SMA branch. There was minimal filling of the aneurysm sac but I felt this needed to be sealed so that we would not have to come back for any further interventions.  We re-ballooned the entire graft and there was still this subtle area of leak.  Therefore a 36 mm cuff was brought up through the right iliac system and deployed about 2 cm below the proximal aspect of the graft but in a napkin ring from a previous aortic anastomosis.  It was deployed with its inferior edge just above the flow divider.  This was then ballooned and an addition completion angiogram performed showing no endoleak, with patent internal external iliac and renal arteries bilaterally with no endoleak.  At this point the Omni flush catheter was removed over a guidewire. All delivery devices were removed. We then proceeded to remove the large 18 French sheath from the right side with the guidewire in place. With pressure held above and below the insertion site the lateral and medial Proglide closure was secured down.   There was good hemostasis. The guidewire was removed from the right side. Attention was then turned to the left groin. In similar fashion the 12 French sheath was removed and the guidewire left in place.  Both pro glides were secured over the wire to obtain hemostasis.  There was good hemostasis and the Bentsen wire was removed from the left groin. The patient had been given 8000 units of heparin before introducing the main body device. This was fully reversed with 80 mg of protamine at the end of the case. Each groin puncture site was then closed with a running 4-0 Vicryl subcuticular stitch.   Patient had Doppler signals in both feet at conclusion of the case.  The patient tolerated procedure well and there were no complications. Instrument sponge and needle counts correct in the case. Patient was awakened in the operating room extubated and taken to the recovery room in stable condition.  [name], MD [facility] [place] Office: [phone] Pager: [phone]

## 2019-09-12 NOTE — Anesthesia Postprocedure Evaluation (Signed)
Anesthesia Post Note  Patient: [name]  Procedure(s) Performed: ABDOMINAL AORTIC ENDOVASCULAR STENT GRAFT (N/A )     Patient location during evaluation: PACU Anesthesia Type: General Level of consciousness: awake and alert Pain management: pain level controlled Vital Signs Assessment: post-procedure vital signs reviewed and stable Respiratory status: spontaneous breathing, nonlabored ventilation, respiratory function stable and patient connected to nasal cannula oxygen Cardiovascular status: blood pressure returned to baseline and stable Postop Assessment: no apparent nausea or vomiting Anesthetic complications: no    Last Vitals:  Vitals:   09/12/19 1102 09/12/19 1118  BP: 124/64 (!) 125/50  Pulse: 71 73  Resp:    Temp:    SpO2: 100% 97%    Last Pain:  Vitals:   09/12/19 1033  PainSc: Asleep                 Kathlyne Loud S

## 2019-09-12 NOTE — Anesthesia Procedure Notes (Signed)
Procedure Name: Intubation Date/Time: 09/12/2019 7:43 AM Performed by: Gaylene Brooks, CRNA Pre-anesthesia Checklist: Patient identified, Emergency Drugs available, Suction available and Patient being monitored Patient Re-evaluated:Patient Re-evaluated prior to induction Oxygen Delivery Method: Circle System Utilized Preoxygenation: Pre-oxygenation with 100% oxygen Induction Type: IV induction Ventilation: Mask ventilation without difficulty Laryngoscope Size: Miller and 2 Grade View: Grade II Tube type: Oral Tube size: 7.5 mm Number of attempts: 1 Airway Equipment and Method: Stylet and Oral airway Placement Confirmation: ETT inserted through vocal cords under direct vision,  positive ETCO2 and breath sounds checked- equal and bilateral Secured at: 22 cm Tube secured with: Tape Dental Injury: Teeth and Oropharynx as per pre-operative assessment

## 2019-09-12 NOTE — Progress Notes (Signed)
Pt received from PACU. CHG bath given. Telebox 08 applied/ccmd notfied. Vitals stable. A line zeroed. Pt denies any complaints. Pt oriented to room. Callbell within reach. Will continue to monitor.  Jerald Kief, RN

## 2019-09-12 NOTE — Interval H&P Note (Signed)
History and Physical Interval Note:  09/12/2019 7:18 AM  [name]  has presented today for surgery, with the diagnosis of aortic pseudoaneurysm distal anastomosis.  The various methods of treatment have been discussed with the patient and family. After consideration of risks, benefits and other options for treatment, the patient has consented to  Procedure(s): ABDOMINAL AORTIC ENDOVASCULAR STENT GRAFT (N/A) as a surgical intervention.  The patient's history has been reviewed, patient examined, no change in status, stable for surgery.  I have reviewed the patient's chart and labs.  Questions were answered to the patient's satisfaction.     [name]

## 2019-09-12 NOTE — Transfer of Care (Signed)
Immediate Anesthesia Transfer of Care Note  Patient: [name]  Procedure(s) Performed: ABDOMINAL AORTIC ENDOVASCULAR STENT GRAFT (N/A )  Patient Location: PACU  Anesthesia Type:General  Level of Consciousness: awake, alert , oriented and drowsy  Airway & Oxygen Therapy: Patient Spontanous Breathing and Patient connected to nasal cannula oxygen  Post-op Assessment: Report given to RN, Post -op Vital signs reviewed and stable and Patient moving all extremities X 4  Post vital signs: Reviewed and stable  Last Vitals:  Vitals Value Taken Time  BP 120/58 09/12/19 1032  Temp    Pulse 78 09/12/19 1033  Resp    SpO2 100 % 09/12/19 1033  Vitals shown include unvalidated device data.  Last Pain:  Vitals:   09/12/19 0646  PainSc: 0-No pain         Complications: No apparent anesthesia complications

## 2019-09-12 NOTE — Anesthesia Procedure Notes (Signed)
Arterial Line Insertion Start/End9/21/2020 7:15 AM, 09/12/2019 7:25 AM Performed by: Kyung Rudd, CRNA, CRNA  Preanesthetic checklist: patient identified, IV checked, site marked, risks and benefits discussed, surgical consent, monitors and equipment checked, pre-op evaluation, timeout performed and anesthesia consent Lidocaine 1% used for infiltration Right, radial was placed Catheter size: 20 G Hand hygiene performed  and maximum sterile barriers used   Attempts: 1 Procedure performed without using ultrasound guided technique. Following insertion, dressing applied and Biopatch. Post procedure assessment: normal  Patient tolerated the procedure well with no immediate complications.

## 2019-09-13 ENCOUNTER — Encounter (HOSPITAL_COMMUNITY): Payer: Self-pay | Admitting: Vascular Surgery

## 2019-09-13 LAB — PREPARE PLATELET PHERESIS
Unit division: 0
Unit division: 0

## 2019-09-13 LAB — BPAM PLATELET PHERESIS
Blood Product Expiration Date: 202009212359
Blood Product Expiration Date: 202009212359
ISSUE DATE / TIME: 202009210755
ISSUE DATE / TIME: 202009210755
Unit Type and Rh: 5100
Unit Type and Rh: 6200

## 2019-09-13 LAB — CBC
HCT: 29.1 % — ABNORMAL LOW (ref 39.0–52.0)
Hemoglobin: 10 g/dL — ABNORMAL LOW (ref 13.0–17.0)
MCH: 30.2 pg (ref 26.0–34.0)
MCHC: 34.4 g/dL (ref 30.0–36.0)
MCV: 87.9 fL (ref 80.0–100.0)
Platelets: 54 10*3/uL — ABNORMAL LOW (ref 150–400)
RBC: 3.31 MIL/uL — ABNORMAL LOW (ref 4.22–5.81)
RDW: 13.6 % (ref 11.5–15.5)
WBC: 8 10*3/uL (ref 4.0–10.5)
nRBC: 0 % (ref 0.0–0.2)

## 2019-09-13 LAB — BASIC METABOLIC PANEL
Anion gap: 9 (ref 5–15)
BUN: 17 mg/dL (ref 8–23)
CO2: 26 mmol/L (ref 22–32)
Calcium: 8.5 mg/dL — ABNORMAL LOW (ref 8.9–10.3)
Chloride: 104 mmol/L (ref 98–111)
Creatinine, Ser: 0.92 mg/dL (ref 0.61–1.24)
GFR calc Af Amer: >60 mL/min (ref >60)
GFR calc non Af Amer: >60 mL/min (ref >60)
Glucose, Bld: 129 mg/dL — ABNORMAL HIGH (ref 70–99)
Potassium: 3.5 mmol/L (ref 3.5–5.1)
Sodium: 139 mmol/L (ref 135–145)

## 2019-09-13 MED ORDER — ASPIRIN 81 MG PO TBEC: 81.0000 mg | DELAYED_RELEASE_TABLET | Freq: Every day | ORAL | Status: DC

## 2019-09-13 NOTE — Discharge Instructions (Signed)
   [facility] [place] [facility]   Discharge Instructions  Endovascular Aortic Aneurysm Repair  Please refer to the following instructions for your post-procedure care. Your surgeon or Physician Assistant will discuss any changes with you.  Activity  You are encouraged to walk as much as you can. You can slowly return to normal activities but must avoid strenuous activity and heavy lifting until your doctor tells you it's OK. Avoid activities such as vacuuming or swinging a gold club. It is normal to feel tired for several weeks after your surgery. Do not drive until your doctor gives the OK and you are no longer taking prescription pain medications. It is also normal to have difficulty with sleep habits, eating, and bowel movements after surgery. These will go away with time.  Bathing/Showering  You may shower after you go home. If you have an incision, do not soak in a bathtub, hot tub, or swim until the incision heals completely.  Incision Care  Shower every day. Clean your incision with mild soap and water. Pat the area dry with a clean towel. You do not need a bandage unless otherwise instructed. Do not apply any ointments or creams to your incision. If you clothing is irritating, you may cover your incision with a dry gauze pad.  Diet  Resume your normal diet. There are no special food restrictions following this procedure. A low fat/low cholesterol diet is recommended for all patients with vascular disease. In order to heal from your surgery, it is CRITICAL to get adequate nutrition. Your body requires vitamins, minerals, and protein. Vegetables are the best source of vitamins and minerals. Vegetables also provide the perfect balance of protein. Processed food has little nutritional value, so try to avoid this.  Medications  Resume taking all of your medications unless your doctor or nurse practitioner tells you not to. If your incision is causing pain, you may take  over-the-counter pain relievers such as acetaminophen (Tylenol). If you were prescribed a stronger pain medication, please be aware these medications can cause nausea and constipation. Prevent nausea by taking the medication with a snack or meal. Avoid constipation by drinking plenty of fluids and eating foods with a high amount of fiber, such as fruits, vegetables, and grains. Do not take Tylenol if you are taking prescription pain medications.   Follow up  Our office will schedule a follow-up appointment with a C.T. scan 3-4 weeks after your surgery.  Please call us immediately for any of the following conditions  Severe or worsening pain in your legs or feet or in your abdomen back or chest. Increased pain, redness, drainage (pus) from your incision sit. Increased abdominal pain, bloating, nausea, vomiting or persistent diarrhea. Fever of 101 degrees or higher. Swelling in your leg (s),  Reduce your risk of vascular disease  Stop smoking. If you would like help call [facility] at 1-800-QUIT-NOW (1-800-784-8669) or [place] at 336-586-4000. Manage your cholesterol Maintain a desired weight Control your diabetes Keep your blood pressure down  If you have questions, please call the office at [phone].   

## 2019-09-13 NOTE — Progress Notes (Addendum)
  Progress Note    09/13/2019 7:19 AM 1 Day Post-Op  Subjective:  Denies new or changing abd or back pain   Vitals:   09/12/19 2031 09/13/19 0243  BP: (!) 130/99 118/68  Pulse: 79 73  Resp: (!) 22 20  Temp: 97.9 F (36.6 C) 98.2 F (36.8 C)  SpO2: 98% 98%   Physical Exam: Lungs:  Non labored Incisions:  B groin incisions c/d/i without hematoma Extremities:  Feet symmetrically warm to touch Neurologic: A&O  CBC    Component Value Date/Time   WBC 8.0 09/13/2019 0500   RBC 3.31 (L) 09/13/2019 0500   HGB 10.0 (L) 09/13/2019 0500   HGB 9.8 (L) 04/28/2018 1632   HCT 29.1 (L) 09/13/2019 0500   HCT 30.1 (L) 04/28/2018 1632   PLT 54 (L) 09/13/2019 0500   PLT 154 04/28/2018 1632   MCV 87.9 09/13/2019 0500   MCV 84 04/28/2018 1632   MCV 86 11/28/2014 1114   MCH 30.2 09/13/2019 0500   MCHC 34.4 09/13/2019 0500   RDW 13.6 09/13/2019 0500   RDW 14.6 04/28/2018 1632   RDW 14.6 (H) 11/28/2014 1114   LYMPHSABS 1.1 09/06/2019 0843   LYMPHSABS 1.7 04/28/2018 1632   LYMPHSABS 0.9 (L) 11/28/2014 1114   MONOABS 0.7 09/06/2019 0843   MONOABS 0.5 11/28/2014 1114   EOSABS 0.2 09/06/2019 0843   EOSABS 0.1 04/28/2018 1632   EOSABS 0.2 11/28/2014 1114   BASOSABS 0.0 09/06/2019 0843   BASOSABS 0.0 04/28/2018 1632   BASOSABS 0.0 11/28/2014 1114    BMET    Component Value Date/Time   NA 139 09/13/2019 0500   NA 136 04/28/2018 1632   NA 135 (L) 08/05/2012 1523   K 3.5 09/13/2019 0500   K 3.5 08/05/2012 1523   CL 104 09/13/2019 0500   CL 98 08/05/2012 1523   CO2 26 09/13/2019 0500   CO2 30 08/05/2012 1523   GLUCOSE 129 (H) 09/13/2019 0500   GLUCOSE 107 (H) 08/05/2012 1523   BUN 17 09/13/2019 0500   BUN 14 04/28/2018 1632   BUN 16 08/05/2012 1523   CREATININE 0.92 09/13/2019 0500   CREATININE 1.03 08/05/2012 1523   CALCIUM 8.5 (L) 09/13/2019 0500   CALCIUM 9.3 08/05/2012 1523   GFRNONAA >60 09/13/2019 0500   GFRNONAA >60 08/05/2012 1523   GFRAA >60 09/13/2019 0500   GFRAA >60 08/05/2012 1523    INR    Component Value Date/Time   INR 1.2 09/12/2019 1100     Intake/Output Summary (Last 24 hours) at 09/13/2019 0719 Last data filed at 09/12/2019 1600 Gross per 24 hour  Intake 1619 ml  Output 1650 ml  Net -31 ml     Assessment/Plan:  85 y.o. male is s/p endovascular repair of distal anastomotic pseudo  1 Day Post-Op   Perfusing BLE well Groin cath sites unremarkable without hematoma Ok for discharge home today Follow up in 1 month with CTA abd/pelvis   Dagoberto Ligas, PA-C [facility] [phone] 09/13/2019 7:19 AM  Agree with above.  DP pulses. Groin no hematoma Follow up 1 month with CT  [name], MD [facility] [place] Office: [phone] Pager: [phone]

## 2019-09-13 NOTE — Discharge Summary (Addendum)
EVAR Discharge Summary   [name] 1926-04-02 85 y.o. male  MRN: [id number]  Admission Date: 09/12/2019  Discharge Date: 09/13/19  Physician: Elam Dutch, MD  Admission Diagnosis: aortic pseudoaneurysm distal anastomosis  Discharge Day services:    see progress note 09/13/19 Physical Exam: Vitals:   09/13/19 0243 09/13/19 0807  BP: 118/68 (!) 131/50  Pulse: 73 69  Resp: 20 16  Temp: 98.2 F (36.8 C)   SpO2: 98%     Hospital Course:  The patient was admitted to the hospital and taken to the operating room on 09/12/2019 and underwent: Repair of infrarenal abdominal aorta distal anastomotic pseudoaneurysm with Gore Excluder stent graft by Dr. Oneida Alar on 09/12/2019  The pt tolerated the procedure well and was transported to the PACU in good condition.   The remainder of the hospital course consisted of increasing mobilization and increasing intake of solids without difficulty.  He will follow-up in office with a CTA abdomen/pelvis in about 4 weeks. He will be discharged home in stable condition this morning.  CBC    Component Value Date/Time   WBC 8.0 09/13/2019 0500   RBC 3.31 (L) 09/13/2019 0500   HGB 10.0 (L) 09/13/2019 0500   HGB 9.8 (L) 04/28/2018 1632   HCT 29.1 (L) 09/13/2019 0500   HCT 30.1 (L) 04/28/2018 1632   PLT 54 (L) 09/13/2019 0500   PLT 154 04/28/2018 1632   MCV 87.9 09/13/2019 0500   MCV 84 04/28/2018 1632   MCV 86 11/28/2014 1114   MCH 30.2 09/13/2019 0500   MCHC 34.4 09/13/2019 0500   RDW 13.6 09/13/2019 0500   RDW 14.6 04/28/2018 1632   RDW 14.6 (H) 11/28/2014 1114   LYMPHSABS 1.1 09/06/2019 0843   LYMPHSABS 1.7 04/28/2018 1632   LYMPHSABS 0.9 (L) 11/28/2014 1114   MONOABS 0.7 09/06/2019 0843   MONOABS 0.5 11/28/2014 1114   EOSABS 0.2 09/06/2019 0843   EOSABS 0.1 04/28/2018 1632   EOSABS 0.2 11/28/2014 1114   BASOSABS 0.0 09/06/2019 0843   BASOSABS 0.0 04/28/2018 1632   BASOSABS 0.0 11/28/2014 1114    BMET     Component Value Date/Time   NA 139 09/13/2019 0500   NA 136 04/28/2018 1632   NA 135 (L) 08/05/2012 1523   K 3.5 09/13/2019 0500   K 3.5 08/05/2012 1523   CL 104 09/13/2019 0500   CL 98 08/05/2012 1523   CO2 26 09/13/2019 0500   CO2 30 08/05/2012 1523   GLUCOSE 129 (H) 09/13/2019 0500   GLUCOSE 107 (H) 08/05/2012 1523   BUN 17 09/13/2019 0500   BUN 14 04/28/2018 1632   BUN 16 08/05/2012 1523   CREATININE 0.92 09/13/2019 0500   CREATININE 1.03 08/05/2012 1523   CALCIUM 8.5 (L) 09/13/2019 0500   CALCIUM 9.3 08/05/2012 1523   GFRNONAA >60 09/13/2019 0500   GFRNONAA >60 08/05/2012 1523   GFRAA >60 09/13/2019 0500   GFRAA >60 08/05/2012 1523         Discharge Diagnosis:  aortic pseudoaneurysm distal anastomosis  Secondary Diagnosis: Patient Active Problem List   Diagnosis Date Noted  . 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 Atrial fibrillation ([place]) 04/13/2018  . S/P CABG x 4 04/08/2018  . Angina pectoris ([place])   . Arthritis 01/26/2017  . MDS (myelodysplastic syndrome) ([place]) 09/01/2016  . Thrombocytopenia ([place]) 08/17/2016  . Aneurysm of abdominal vessel ([place]) 07/23/2016  . Anemia 05/06/2016  . Benign essential hypertension 08/07/2015  . Bilateral carotid artery stenosis 08/07/2015  .  CAD (coronary artery disease) 08/07/2015  . Hyperlipidemia, mixed 08/07/2015  . Personal history of other malignant neoplasm of skin 12/13/2012  . Chronic vascular insufficiency of intestine ([facility]) 05/24/2012  . Abdominal pain, right lower quadrant 05/24/2012  . Occlusion and stenosis of carotid artery without mention of cerebral infarction 04/29/2012   Past Medical History:  Diagnosis Date  . AAA (abdominal aortic aneurysm) ([place]) 1999   a. treated with open repair in 1999 with subsequent distal anastomotic pseudoaneurysm that is being followed by Dr. Oneida Alar.  . Adenomatous colon polyp   . Aorta aneurysm ([place])   . Arthritis   . Basal cell cancer Dec. 24, 2013   Nose  . CAD  (coronary artery disease)    a. s/p CABG 03/2018.  [facility]) Aug. 2013   prostate Seeding  implant  . Carotid artery occlusion   . Enlarged prostate 2008   Thermotherapy  . Gastroesophageal reflux disease   . GI bleed   . Hyperlipidemia   . Hypertension   . Mild aortic stenosis   . Myelodysplasia (myelodysplastic syndrome) ([place])   . Pancytopenia ([place])    sees Dr. Grayland Ormond  . Peripheral vascular disease ([place])   . Postoperative atrial fibrillation ([place])   . Psoriasis   . S/P CABG (coronary artery bypass graft)      Allergies as of 09/13/2019      Reactions   Ciprofloxacin Other (See Comments)   Abdominal Pain      Medication List    TAKE these medications   amLODipine 10 MG tablet Commonly known as: NORVASC Take 0.5 tablets (5 mg total) by mouth daily.   COSAMIN DS PO Take 2 tablets by mouth daily at 12 noon. Notes to patient: You did not receive this medication during your hospital stay. You may resume it after discharge.   Magnesium 100 MG Tabs Take 200 mg by mouth daily with supper. Notes to patient: You did not receive this medication during your hospital stay. You may resume it after discharge.   Melatonin 3 MG Tabs Take 3 mg by mouth at bedtime. Notes to patient: You did not receive this medication during your hospital stay. You may resume it after discharge.   multivitamin with minerals Tabs tablet Take 1 tablet by mouth daily at 12 noon. Centrum Silver Notes to patient: You did not receive this medication during your hospital stay. You may resume it after discharge.   oxyCODONE-acetaminophen 5-325 MG tablet Commonly known as: PERCOCET/ROXICET Take 1 tablet by mouth every 8 (eight) hours as needed for moderate pain or severe pain.   polyethylene glycol 17 g packet Commonly known as: MIRALAX / GLYCOLAX Take 17 g by mouth daily. What changed:   when to take this  reasons to take this Notes to patient: You did not receive this medication during your  hospital stay. You may resume it after discharge.   rosuvastatin 20 MG tablet Commonly known as: CRESTOR Take 20 mg by mouth at bedtime. Notes to patient: You did not receive this medication during your hospital stay. You may resume it after discharge.   valsartan-hydrochlorothiazide 160-12.5 MG tablet Commonly known as: Diovan HCT Take 1 tablet by mouth daily. Notes to patient: You did not receive this medication during your hospital stay. You may resume it after discharge.         [facility]  Discharge Instructions Endovascular Aortic Aneurysm Repair  Please refer to the following instructions for your post-procedure care. Your  surgeon or Physician Assistant will discuss any changes with you.  Activity  You are encouraged to walk as much as you can. You can slowly return to normal activities but must avoid strenuous activity and heavy lifting until your doctor tells you it's OK. Avoid activities such as vacuuming or swinging a gold club. It is normal to feel tired for several weeks after your surgery. Do not drive until your doctor gives the OK and you are no longer taking prescription pain medications. It is also normal to have difficulty with sleep habits, eating, and bowel movements after surgery. These will go away with time.  Bathing/Showering  You may shower after you go home. If you have an incision, do not soak in a bathtub, hot tub, or swim until the incision heals completely.  Incision Care  Shower every day. Clean your incision with mild soap and water. Pat the area dry with a clean towel. You do not need a bandage unless otherwise instructed. Do not apply any ointments or creams to your incision. If you clothing is irritating, you may cover your incision with a dry gauze pad.  Diet  Resume your normal diet. There are no special food restrictions following this procedure. A low fat/low cholesterol diet is recommended for all patients  with vascular disease. In order to heal from your surgery, it is CRITICAL to get adequate nutrition. Your body requires vitamins, minerals, and protein. Vegetables are the best source of vitamins and minerals. Vegetables also provide the perfect balance of protein. Processed food has little nutritional value, so try to avoid this.  Medications  Resume taking all of your medications unless your doctor or Physician Assistnat tells you not to. If your incision is causing pain, you may take over-the-counter pain relievers such as acetaminophen (Tylenol). If you were prescribed a stronger pain medication, please be aware these medications can cause nausea and constipation. Prevent nausea by taking the medication with a snack or meal. Avoid constipation by drinking plenty of fluids and eating foods with a high amount of fiber, such as fruits, vegetables, and grains. Do not take Tylenol if you are taking prescription pain medications.   Follow up  [place] office will schedule a follow-up appointment with a C.T. scan 3-4 weeks after your surgery.  Please call us immediately for any of the following conditions  Severe or worsening pain in your legs or feet or in your abdomen back or chest. Increased pain, redness, drainage (pus) from your incision sit. Increased abdominal pain, bloating, nausea, vomiting or persistent diarrhea. Fever of 101 degrees or higher. Swelling in your leg (s),  Reduce your risk of vascular disease  .Stop smoking. If you would like help call [facility] at 1-800-QUIT-NOW 442-094-4528) or [place] at (515)058-8560. .Manage your cholesterol .Maintain a desired weight .Control your diabetes .Keep your blood pressure down  If you have questions, please call the office at [phone].   Disposition: home  Patient's condition: is Good  Follow up: 1. Dr. Oneida Alar in 4 weeks with CTA protocol   Dagoberto Ligas, PA-C [facility] [phone] 09/13/2019   10:51 AM   - For VQI Registry use - Post-op:  Time to Extubation: [x]  In OR, [ ]  < 12 hrs, [ ]  12-24 hrs, [ ]  >=24 hrs Vasopressors Req. Post-op: No MI: No., [ ]  Troponin only, [ ]  EKG or Clinical New Arrhythmia: No CHF: No ICU Stay: 0 days Transfusion: No   Complications: Resp failure: No., [ ]  Pneumonia, [ ]   Ventilator Chg in renal function: No., [ ]  Inc. Cr > 0.5, [ ]  Temp. Dialysis,  [ ]  Permanent dialysis Leg ischemia: No., no Surgery needed, [ ]  Yes, Surgery needed,  [ ]  Amputation Bowel ischemia: No., [ ]  Medical Rx, [ ]  Surgical Rx Wound complication: No., [ ]  Superficial separation/infection, [ ]  Return to OR Return to OR: No  Return to OR for bleeding: No Stroke: No., [ ]  Minor, [ ]  Major  Discharge medications: Statin use:  Yes  ASA use:  No  Plavix use:  No  Beta blocker use:  No  ARB use:  Yes ACEI use:  No CCB use:  Yes

## 2019-09-14 ENCOUNTER — Other Ambulatory Visit: Payer: Self-pay | Admitting: Vascular Surgery

## 2019-09-14 DIAGNOSIS — I714 Abdominal aortic aneurysm, without rupture, unspecified: Secondary | ICD-10-CM

## 2019-09-14 DIAGNOSIS — I2510 Atherosclerotic heart disease of native coronary artery without angina pectoris: Secondary | ICD-10-CM

## 2019-09-15 ENCOUNTER — Other Ambulatory Visit: Payer: Self-pay | Admitting: Vascular Surgery

## 2019-09-15 DIAGNOSIS — I714 Abdominal aortic aneurysm, without rupture, unspecified: Secondary | ICD-10-CM

## 2019-09-16 ENCOUNTER — Telehealth: Payer: Self-pay

## 2019-09-16 LAB — TYPE AND SCREEN
ABO/RH(D): A POS
Antibody Screen: NEGATIVE
Unit division: 0
Unit division: 0

## 2019-09-16 LAB — BPAM RBC
Blood Product Expiration Date: 202009302359
Blood Product Expiration Date: 202010022359
ISSUE DATE / TIME: 202009210650
ISSUE DATE / TIME: 202009210650
Unit Type and Rh: 6200
Unit Type and Rh: 6200

## 2019-09-16 NOTE — Telephone Encounter (Signed)
Son Dominica Severin called and wanted to know when patient could go back to driving and if soreness in the area of the incision was normal  Advised him that soreness was normal at this point and he should be taking it easy. Advised him that we would prefer he wait to drive till he is seen in the office for his follow up.   York Cerise, CMA

## 2019-09-19 NOTE — Telephone Encounter (Signed)
Called son and advised him that the bruising is normal and it may get even worse before it starts getting better. I told him that if he starts running fevers or has extreme pain other than the normal soreness that he should let us know.   York Cerise, CMA

## 2019-09-23 ENCOUNTER — Ambulatory Visit: Payer: Medicare Other | Admitting: Interventional Cardiology

## 2019-09-29 NOTE — Telephone Encounter (Signed)
It looks like patient went to ED and was admitted for AAA repair. Left message for patient to call back if there were and questions or concerns for Dr. Irish Lack.

## 2019-10-05 ENCOUNTER — Other Ambulatory Visit: Payer: Self-pay

## 2019-10-05 ENCOUNTER — Ambulatory Visit
Admission: RE | Admit: 2019-10-05 | Discharge: 2019-10-05 | Disposition: A | Discharge status: Home / Self Care | Payer: Medicare Other | Source: Ambulatory Visit | Attending: Vascular Surgery | Admitting: Vascular Surgery

## 2019-10-05 ENCOUNTER — Ambulatory Visit: Payer: Medicare Other

## 2019-10-05 DIAGNOSIS — I714 Abdominal aortic aneurysm, without rupture, unspecified: Secondary | ICD-10-CM

## 2019-10-05 MED ORDER — IOHEXOL 350 MG/ML SOLN
80.0000 mL | Freq: Once | INTRAVENOUS | Status: AC | PRN
  Administered 2019-10-05: 15:00:00 80 mL via INTRAVENOUS

## 2019-10-11 ENCOUNTER — Other Ambulatory Visit: Payer: Medicare Other

## 2019-10-13 ENCOUNTER — Ambulatory Visit (INDEPENDENT_AMBULATORY_CARE_PROVIDER_SITE_OTHER): Payer: Self-pay | Admitting: Vascular Surgery

## 2019-10-13 ENCOUNTER — Other Ambulatory Visit: Payer: Self-pay

## 2019-10-13 ENCOUNTER — Encounter: Payer: Self-pay | Admitting: Vascular Surgery

## 2019-10-13 VITALS — BP 145/70 | HR 51 | Temp 97.5°F | Resp 20 | Ht 69.0 in | Wt 153.0 lb

## 2019-10-13 DIAGNOSIS — I714 Abdominal aortic aneurysm, without rupture, unspecified: Secondary | ICD-10-CM

## 2019-10-13 NOTE — Progress Notes (Signed)
Patient is a 85 year old male who returns for follow-up today.  He underwent Gore Excluder stent graft repair of a distal anastomotic aortic pseudoaneurysm September 12, 2019.  He has no abdominal pain or pain in his groins at this point.  He wishes to return to normal activities.  Physical exam:  Vitals:   10/13/19 0842  BP: (!) 145/70  Pulse: (!) 51  Resp: 20  Temp: (!) 97.5 F (36.4 C)  SpO2: 100%  Weight: 153 lb (69.4 kg)  Height: 5\' 9"  (1.753 m)    Extremities: 2+ femoral pulses  Abdomen: Soft nontender  Data: I reviewed the patient's recent CT angiogram post procedure which shows good exclusion of the distal pseudoaneurysm there is a moderate left renal artery stenosis otherwise the stent graft looks to be in good position.  Assessment: Doing well status post Gore Excluder stent graft repair of prior distal anastomotic pseudoaneurysm from abdominal aortic aneurysm repair.  Plan: The patient will follow-up in 1 year with a duplex ultrasound of his stent graft.  He will be seen in our APP clinic.  [name], MD [facility] [place] Office: [phone]

## 2019-11-07 ENCOUNTER — Telehealth: Payer: Self-pay | Admitting: *Deleted

## 2019-11-07 NOTE — Telephone Encounter (Signed)
Call from patient's son requesting appointment to check for post-op pain after "streaching over a chain and 1 week history of pain". He can not tell me where the pain is, but claims he state pain is a 5/10. He can not tell me if any thing makes it worse or better, or any other details. Patient is very [place] and unable to hear over the phone. Appt. Given to see NP on 11/10/2019.

## 2019-11-09 ENCOUNTER — Other Ambulatory Visit: Payer: Medicare Other

## 2019-11-10 ENCOUNTER — Other Ambulatory Visit (HOSPITAL_COMMUNITY): Payer: Self-pay | Admitting: Family

## 2019-11-10 ENCOUNTER — Other Ambulatory Visit: Payer: Self-pay

## 2019-11-10 ENCOUNTER — Ambulatory Visit (HOSPITAL_COMMUNITY)
Admission: RE | Admit: 2019-11-10 | Discharge: 2019-11-10 | Disposition: A | Discharge status: Home / Self Care | Payer: Medicare Other | Source: Ambulatory Visit | Attending: Family | Admitting: Family

## 2019-11-10 ENCOUNTER — Encounter: Payer: Self-pay | Admitting: Family

## 2019-11-10 ENCOUNTER — Ambulatory Visit (INDEPENDENT_AMBULATORY_CARE_PROVIDER_SITE_OTHER): Payer: Self-pay | Admitting: Family

## 2019-11-10 VITALS — BP 139/66 | HR 56 | Temp 97.8°F | Resp 14 | Ht 69.0 in | Wt 153.0 lb

## 2019-11-10 DIAGNOSIS — I714 Abdominal aortic aneurysm, without rupture, unspecified: Secondary | ICD-10-CM

## 2019-11-10 DIAGNOSIS — I6523 Occlusion and stenosis of bilateral carotid arteries: Secondary | ICD-10-CM

## 2019-11-10 DIAGNOSIS — R1084 Generalized abdominal pain: Secondary | ICD-10-CM | POA: Diagnosis present

## 2019-11-10 DIAGNOSIS — Z95828 Presence of other vascular implants and grafts: Secondary | ICD-10-CM

## 2019-11-10 DIAGNOSIS — R1012 Left upper quadrant pain: Secondary | ICD-10-CM

## 2019-11-10 DIAGNOSIS — R1032 Left lower quadrant pain: Secondary | ICD-10-CM

## 2019-11-10 NOTE — Progress Notes (Signed)
[facility] [place]  CC: LUQ and LLQ abdominal pain after leaning over a chair, s/p EVAR  History of Present Illness  [name] is a 85 y.o. (1926-08-08) male who is s/p Gore Excluder stent graft repair of a distal anastomotic aortic pseudoaneurysm September 12, 2019 by Dr. Oneida Alar.    Dr. Oneida Alar last examined pt on 10-13-19. At that time CT angiogram showed good exclusion of the distal pseudoaneurysm; there was a moderate left renal artery stenosis otherwise the stent graft looked to be in good position. Doing well status post Gore Excluder stent graft repair of prior distal anastomotic pseudoaneurysm from abdominal aortic aneurysm repair. The patient was to follow-up in 1 year with a duplex ultrasound of his stent graft.  He will be seen in our APP clinic.  He returns today with c/o left upper and lower abdominal pain after leaning over a chair 9 days ago, pain improved, then hurt this morning after getting out of bed. He states that his chronic back pain is about the same.  He denies any nausea, denies any blood in his stool.   Pt states he still exercises daily for an hour.  He was participating in the senior games until the pandemic. He has chronic back pain for which he takes oxycodone prescribed by Dr. Nelva Bush, his orthopod. Dr. Grayland Ormond is his hematologist at [facility].  Diabetic:No Tobacco [id number] smoker, quitin 1998  Pt meds include: Statin :Yes Betablocker:No ASA:No, does not take due to low platelets  Other anticoagulants/antiplatelets:no    Past Medical History:  Diagnosis Date  . AAA (abdominal aortic aneurysm) ([place]) 1999   a. treated with open repair in 1999 with subsequent distal anastomotic pseudoaneurysm that is being followed by Dr. Oneida Alar.  . Adenomatous colon polyp   . 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 Arthritis   . Basal cell cancer Dec. 24, 2013   Nose  . CAD (coronary artery disease)    a. s/p CABG 03/2018.  Marland Kitchen Cancer [facility])  Aug. 2013   prostate Seeding  implant  . Carotid artery occlusion   . Enlarged prostate 2008   Thermotherapy  . Gastroesophageal reflux disease   . GI bleed   . Hyperlipidemia   . Hypertension   . Mild aortic stenosis   . Myelodysplasia (myelodysplastic syndrome) ([place])   . Pancytopenia ([place])    sees Dr. Grayland Ormond  . Peripheral vascular disease ([place])   . Postoperative atrial fibrillation ([place])   . Psoriasis   . S/P CABG (coronary artery bypass graft)    Past Surgical History:  Procedure Laterality Date  . ABDOMINAL AORTIC ANEURYSM REPAIR  1999  . ABDOMINAL AORTIC ENDOVASCULAR STENT GRAFT N/A 09/12/2019   Procedure: ABDOMINAL AORTIC ENDOVASCULAR STENT GRAFT;  Surgeon: Elam Dutch, MD;  Location: [place];  Service: Vascular;  Laterality: N/A;  . CARDIAC CATHETERIZATION  10/2009  . CHOLECYSTECTOMY    . CORONARY ARTERY BYPASS GRAFT N/A 04/08/2018   Procedure: CORONARY ARTERY BYPASS GRAFTING (CABG) x four, using left mammary artery and right leg greater saphenous vein harvested endoscopically.;  Surgeon: Gaye Pollack, MD;  [facility] OR;  Service: Open Heart Surgery;  Laterality: N/A;  . ESOPHAGOGASTRODUODENOSCOPY (EGD) WITH PROPOFOL N/A 06/26/2016   Procedure: ESOPHAGOGASTRODUODENOSCOPY (EGD) WITH PROPOFOL;  Surgeon: Lollie Sails, MD;  Location: [facility] ENDOSCOPY;  Service: Endoscopy;  Laterality: N/A;  . EYE SURGERY  1990   cataract surgery bilateral  . LEFT HEART CATH AND CORONARY ANGIOGRAPHY N/A 03/15/2018   Procedure:  LEFT HEART CATH AND CORONARY ANGIOGRAPHY;  Surgeon: Jettie Booze, MD;  Location: [place] CV LAB;  Service: Cardiovascular;  Laterality: N/A;  . PROSTATE SURGERY    . SPINE SURGERY  2009   ruptured disc  . TEE WITHOUT CARDIOVERSION N/A 04/08/2018   Procedure: TRANSESOPHAGEAL ECHOCARDIOGRAM (TEE);  Surgeon: Gaye Pollack, MD;  Location: [place];  Service: Open Heart Surgery;  Laterality: N/A;  . TONSILLECTOMY     removed as a teenager  . Visceral  angiogram  05/14/2012   Right  groin area  . VISCERAL ANGIOGRAM N/A 05/14/2012   Procedure: VISCERAL ANGIOGRAM;  Surgeon: Elam Dutch, MD;  Location: [facility] CATH LAB;  Service: Cardiovascular;  Laterality: N/A;   Social History Social History   Tobacco Use  . Smoking status: Former Smoker    Types: Cigarettes    Quit date: 04/29/1997    Years since quitting: 22.5  . Smokeless tobacco: Never Used  Substance Use Topics  . Alcohol use: No  . Drug use: No   Family History Family History  Problem Relation Age of Onset  . Heart attack Father   . Heart disease Father        After age 29  . COPD Father   . Cancer Sister        Breast and Brain  . Heart disease Brother        before age 60  . Heart attack Brother    Current Outpatient Medications on File Prior to Visit  Medication Sig Dispense Refill  . amLODipine (NORVASC) 10 MG tablet Take 0.5 tablets (5 mg total) by mouth daily. 60 tablet 1  . Glucosamine-Chondroitin (COSAMIN DS PO) Take 2 tablets by mouth daily at 12 noon.    . Magnesium 100 MG TABS Take 200 mg by mouth daily with supper.    . Melatonin 3 MG TABS Take 3 mg by mouth at bedtime.    . Multiple Vitamin (MULTIVITAMIN WITH MINERALS) TABS tablet Take 1 tablet by mouth daily at 12 noon. Centrum Silver    . oxyCODONE-acetaminophen (PERCOCET/ROXICET) 5-325 MG tablet Take 1 tablet by mouth every 8 (eight) hours as needed for moderate pain or severe pain.    . polyethylene glycol (MIRALAX / GLYCOLAX) packet Take 17 g by mouth daily. (Patient taking differently: Take 17 g by mouth daily as needed for mild constipation. ) 14 each 0  . rosuvastatin (CRESTOR) 20 MG tablet Take 20 mg by mouth at bedtime.     . valsartan-hydrochlorothiazide (DIOVAN HCT) 160-12.5 MG tablet Take 1 tablet by mouth daily. 30 tablet 1   No current facility-administered medications on file prior to visit.    Allergies  Allergen Reactions  . Ciprofloxacin Other (See Comments)    Abdominal Pain       ROS: See HPI for pertinent positives and negatives.  Physical Examination  Vitals:   11/10/19 1446  BP: 139/66  Pulse: (!) 56  Resp: 14  Temp: 97.8 F (36.6 C)  TempSrc: Temporal  SpO2: 98%  Weight: 153 lb (69.4 kg)  Height: 5\' 9"  (1.753 m)   Body mass index is 22.59 kg/m.  General: A&O x 3, WD, fit appearing elderly male HEENT: No gross abnormalities  Pulmonary: Sym exp, respirations are non labored, fair air movement in all fields no rales, rhonchi, or wheezes. Cardiac: Regular rhythm, bradycardic, + low grade murmur  Vascular: Vessel Right Left  Radial 2+Palpable 2+Palpable  Carotid  without bruit  without bruit  Aorta  Not palpable N/A  Femoral 2+Palpable 2+Palpable  Popliteal Not palpable Not palpable  PT Not Palpable Not Palpable  DP 2+Palpable 2+Palpable   Gastrointestinal: soft, NTND, -G/R, - HSM, - palpable masses, - CVAT B. Musculoskeletal: M/S 5/5 throughout, extremities without ischemic changes  Skin: No rashes, no ulcers, no cellulitis.  Multiple petechiae on extremities  Neurologic: Pain and light touch intact in extremities, Motor exam as listed above. Psychiatric: Normal thought content, mood appropriate for clinical situation.     DATA  EVAR Duplex (11-10-19): Endovascular Aortic Repair (EVAR): +----------+--------------+--------------+----------------+--------------------+           Diameter AP   Diameter TransVelocities      Comments                       (cm)          (cm)          (cm/sec)                             +----------+--------------+--------------+----------------+--------------------+ Aorta     3.40          3.55          51              Thrombosed avascular                                                       psuedoanuerysm -                                                           2.45 x 2.81 cm       +----------+--------------+--------------+----------------+--------------------+ Right  Limb1.66          1.54          94                                   +----------+--------------+--------------+----------------+--------------------+ Left Limb 1.54          1.45          87                                   +----------+--------------+--------------+----------------+--------------------+ Summary: Abdominal Aorta: The largest aortic measurement is 3.6 cm. Patent endovascular aneurysm repair with evidence of endoleak. Psuedoanuerysm sac measures 2.45 x 2.81 cm. No flow appreciated.    Carotid Duplex (07-19-19): Right Carotid: Velocities in the right ICA are consistent with a 40-59% stenosis. Left Carotid: Velocities in the left proximal to mid ICA are consistent with a 60-79% stenosis. Vertebrals:  Bilateral vertebral arteries demonstrate antegrade flow. Subclavians: Normal flow hemodynamics were seen in bilateral subclavian arteries.    Medical Decision Making  [name] is a 85 y.o. male who presents s/p EVAR (Date: 09-12-19).  I discussed EVAR duplex results with Dr. Oneida Alar, no endoleak, no abnormalities.   I discussed with Dr. Oneida Alar pt pertinent HPI, physical exam results, and  EVAR duplex results (no abnormalities on duplex). Follow up in 1 year with EVAR duplex.    Follow up in January 2021 with carotid duplex.   Pt exercises daily, wins medal in the senior games.   I emphasized the importance of maximal medical management including strict control of blood pressure, blood glucose, and lipid levels, antiplatelet agents, obtaining regular exercise, and cessation of smoking.   Thank you for allowing [place] to participate in this patient's care.  Clemon Chambers, RN, MSN, FNP-C [facility] [place] Office: [phone]  Clinic Physician: Laqueta Due  11/10/2019, 2:57 PM

## 2019-11-10 NOTE — Patient Instructions (Signed)

## 2019-11-21 ENCOUNTER — Telehealth: Payer: Self-pay | Admitting: *Deleted

## 2019-11-21 NOTE — Telephone Encounter (Signed)
Patient called and stated he is still having pain in his side. He saw NP after having vascular duplex s/p EVAR. He states he was told everything with the stent graft was WNL. " I am afraid I caused another aneurysm when I reached over the chair" He states he has not followed up with PCP to have this pain evaluated. I instructed him to report to [facility] ER for any acute or worsening condition, SOB, chest pain, abdominal or back pain. Verbalized understanding.

## 2019-12-01 DIAGNOSIS — M5137 Other intervertebral disc degeneration, lumbosacral region: Secondary | ICD-10-CM | POA: Insufficient documentation

## 2019-12-06 ENCOUNTER — Other Ambulatory Visit: Payer: Self-pay

## 2019-12-06 DIAGNOSIS — I6529 Occlusion and stenosis of unspecified carotid artery: Secondary | ICD-10-CM

## 2020-01-05 ENCOUNTER — Other Ambulatory Visit: Payer: Self-pay | Admitting: Gastroenterology

## 2020-01-05 DIAGNOSIS — R1032 Left lower quadrant pain: Secondary | ICD-10-CM

## 2020-01-09 ENCOUNTER — Other Ambulatory Visit: Payer: Self-pay | Admitting: Vascular Surgery

## 2020-01-09 ENCOUNTER — Telehealth (HOSPITAL_COMMUNITY): Payer: Self-pay

## 2020-01-09 DIAGNOSIS — I714 Abdominal aortic aneurysm, without rupture, unspecified: Secondary | ICD-10-CM

## 2020-01-09 DIAGNOSIS — I2510 Atherosclerotic heart disease of native coronary artery without angina pectoris: Secondary | ICD-10-CM

## 2020-01-09 NOTE — Progress Notes (Signed)
HISTORY AND PHYSICAL     CC:  follow up. Requesting Provider:  [name], MD  HPI: This is a 86 y.o. male here for follow up for carotid artery stenosis.  He has hx of right ICA stenosis of 40-59% and left ICA stenosis of 60-79%.    Pt also has hx of EVAR on 09/12/2019 by Dr. Oneida Alar.  His u/s revealed  The largest aortic measurement was 3.6cm with evidence of endoleak.  The psa sac measured 2.45 x 2.81 and no flow appreciated.  He was seen by NP on 11/10/2019 and she spoke with Dr. Oneida Alar about the results & pt will f/u in one year for EVAR duplex. Dr. Oneida Alar last examined pt on 10-13-19. At that time CT angiogram showed good exclusion of the distal pseudoaneurysm; there was a moderate left renal artery stenosis otherwise the stent graft looked to be in good position.  Pt is doing well and denies any weakness or hemiparalysis, amaurosis fugax, clumsiness or speech difficulties.  He states he walks on the treadmill for an hour a day and works out with dumb bells.  He is anxious to get back home to his wife as she is sick.   The pt is on a statin for cholesterol management.  The pt is not on a daily aspirin.   Other AC:  none The pt is on CCB ARB for hypertension.   The pt is not diabetic.   Tobacco hx:  remote   Past Medical History:  Diagnosis Date  . AAA (abdominal aortic aneurysm) ([place]) 1999   a. treated with open repair in 1999 with subsequent distal anastomotic pseudoaneurysm that is being followed by Dr. Oneida Alar.  . Adenomatous colon polyp   . Aorta aneurysm ([place])   . Arthritis   . Basal cell cancer Dec. 24, 2013   Nose  . CAD (coronary artery disease)    a. s/p CABG 03/2018.  Marland Kitchen Cancer [facility]) Aug. 2013   prostate Seeding  implant  . Carotid artery occlusion   . Enlarged prostate 2008   Thermotherapy  . Gastroesophageal reflux disease   . GI bleed   . Hyperlipidemia   . Hypertension   . Mild aortic stenosis   . Myelodysplasia (myelodysplastic syndrome) ([place])   .  Pancytopenia ([place])    sees Dr. Grayland Ormond  . Peripheral vascular disease ([place])   . Postoperative atrial fibrillation ([place])   . Psoriasis   . S/P CABG (coronary artery bypass graft)     Past Surgical History:  Procedure Laterality Date  . ABDOMINAL AORTIC ANEURYSM REPAIR  1999  . ABDOMINAL AORTIC ENDOVASCULAR STENT GRAFT N/A 09/12/2019   Procedure: ABDOMINAL AORTIC ENDOVASCULAR STENT GRAFT;  Surgeon: Elam Dutch, MD;  Location: [place];  Service: Vascular;  Laterality: N/A;  . CARDIAC CATHETERIZATION  10/2009  . CHOLECYSTECTOMY    . CORONARY ARTERY BYPASS GRAFT N/A 04/08/2018   Procedure: CORONARY ARTERY BYPASS GRAFTING (CABG) x four, using left mammary artery and right leg greater saphenous vein harvested endoscopically.;  Surgeon: Gaye Pollack, MD;  [facility]: [facility] OR;  Service: Open Heart Surgery;  Laterality: N/A;  . ESOPHAGOGASTRODUODENOSCOPY (EGD) WITH PROPOFOL N/A 06/26/2016   Procedure: ESOPHAGOGASTRODUODENOSCOPY (EGD) WITH PROPOFOL;  Surgeon: Lollie Sails, MD;  Location: [facility] ENDOSCOPY;  Service: Endoscopy;  Laterality: N/A;  . EYE SURGERY  1990   cataract surgery bilateral  . LEFT HEART CATH AND CORONARY ANGIOGRAPHY N/A 03/15/2018   Procedure: LEFT HEART CATH AND CORONARY ANGIOGRAPHY;  Surgeon: Larae Grooms  S, MD;  Location: [place] CV LAB;  Service: Cardiovascular;  Laterality: N/A;  . PROSTATE SURGERY    . SPINE SURGERY  2009   ruptured disc  . TEE WITHOUT CARDIOVERSION N/A 04/08/2018   Procedure: TRANSESOPHAGEAL ECHOCARDIOGRAM (TEE);  Surgeon: Gaye Pollack, MD;  Location: [place];  Service: Open Heart Surgery;  Laterality: N/A;  . TONSILLECTOMY     removed as a teenager  . Visceral angiogram  05/14/2012   Right  groin area  . VISCERAL ANGIOGRAM N/A 05/14/2012   Procedure: VISCERAL ANGIOGRAM;  Surgeon: Elam Dutch, MD;  Location: [facility] CATH LAB;  Service: Cardiovascular;  Laterality: N/A;    Allergies  Allergen Reactions  . Ciprofloxacin Other (See  Comments)    Abdominal Pain     Current Outpatient Medications  Medication Sig Dispense Refill  . amLODipine (NORVASC) 10 MG tablet Take 0.5 tablets (5 mg total) by mouth daily. 60 tablet 1  . Glucosamine-Chondroitin (COSAMIN DS PO) Take 2 tablets by mouth daily at 12 noon.    . Magnesium 100 MG TABS Take 200 mg by mouth daily with supper.    . Melatonin 3 MG TABS Take 3 mg by mouth at bedtime.    . Multiple Vitamin (MULTIVITAMIN WITH MINERALS) TABS tablet Take 1 tablet by mouth daily at 12 noon. Centrum Silver    . oxyCODONE-acetaminophen (PERCOCET/ROXICET) 5-325 MG tablet Take 1 tablet by mouth every 8 (eight) hours as needed for moderate pain or severe pain.    . polyethylene glycol (MIRALAX / GLYCOLAX) packet Take 17 g by mouth daily. (Patient taking differently: Take 17 g by mouth daily as needed for mild constipation. ) 14 each 0  . rosuvastatin (CRESTOR) 20 MG tablet Take 20 mg by mouth at bedtime.     . valsartan-hydrochlorothiazide (DIOVAN HCT) 160-12.5 MG tablet Take 1 tablet by mouth daily. 30 tablet 1   No current facility-administered medications for this visit.    Family History  Problem Relation Age of Onset  . Heart attack Father   . Heart disease Father        After age 3  . COPD Father   . Cancer Sister        Breast and Brain  . Heart disease Brother        before age 37  . Heart attack Brother     Social History   Socioeconomic History  . Marital status: Married    Spouse name: Not on file  . Number of children: Not on file  . Years of education: Not on file  . Highest education level: Not on file  Occupational History  . Not on file  Tobacco Use  . Smoking status: Former Smoker    Types: Cigarettes    Quit date: 04/29/1997    Years since quitting: 22.7  . Smokeless tobacco: Never Used  Substance and Sexual Activity  . Alcohol use: No  . Drug use: No  . Sexual activity: Not on file  Other Topics Concern  . Not on file  Social History Narrative   . Not on file   Social Determinants of Health   Financial Resource Strain:   . Difficulty of Paying Living Expenses: Not on file  Food Insecurity:   . Worried About Charity fundraiser in the Last Year: Not on file  . Ran Out of Food in the Last Year: Not on file  Transportation Needs:   . Lack of Transportation (Medical): Not on file  .  Lack of Transportation (Non-Medical): Not on file  Physical Activity:   . Days of Exercise per Week: Not on file  . Minutes of Exercise per Session: Not on file  Stress:   . Feeling of Stress : Not on file  Social Connections:   . Frequency of Communication with Friends and Family: Not on file  . Frequency of Social Gatherings with Friends and Family: Not on file  . Attends Religious Services: Not on file  . Active Member of Clubs or Organizations: Not on file  . Attends Archivist Meetings: Not on file  . Marital Status: Not on file  Intimate Partner Violence:   . Fear of Current or Ex-Partner: Not on file  . Emotionally Abused: Not on file  . Physically Abused: Not on file  . Sexually Abused: Not on file     REVIEW OF SYSTEMS:   [X]  denotes positive finding, [ ]  denotes negative finding Cardiac  Comments:  Chest pain or chest pressure:    Shortness of breath upon exertion:    Short of breath when lying flat:    Irregular heart rhythm:        Vascular    Pain in calf, thigh, or hip brought on by ambulation:    Pain in feet at night that wakes you up from your sleep:     Blood clot in your veins:    Leg swelling:         Pulmonary    Oxygen at home:    Productive cough:     Wheezing:         Neurologic    Sudden weakness in arms or legs:     Sudden numbness in arms or legs:     Sudden onset of difficulty speaking or slurred speech:    Temporary loss of vision in one eye:     Problems with dizziness:         Gastrointestinal    Blood in stool:     Vomited blood:         Genitourinary    Burning when  urinating:     Blood in urine:        Psychiatric    Major depression:         Hematologic    Bleeding problems:    Problems with blood clotting too easily:        Skin    Rashes or ulcers:        Constitutional    Fever or chills:      PHYSICAL EXAMINATION:  Today's Vitals   01/10/20 1325  BP: (!) 148/72  Pulse: 69  Resp: 18  SpO2: 100%  Weight: 156 lb (70.8 kg)  Height: 5\' 9"  (1.753 m)  PainSc: 6    Body mass index is 23.04 kg/m.   General:  WDWN in NAD; vital signs documented above Gait: Not observed HENT: WNL, normocephalic Pulmonary: normal non-labored breathing , without Rales, rhonchi,  wheezing Cardiac: regular HR, without  Murmurs, rubs or gallops; without carotid bruits Abdomen: soft, NT, no masses Skin: without rashes Vascular Exam/Pulses:  Right Left  Radial 2+ (normal) 2+ (normal)  DP 1+ (weak) 1+ (weak)  PT Unable to palpate  Unable to palpate    Extremities: without ischemic changes, without Gangrene , without cellulitis; without open wounds;  Musculoskeletal: no muscle wasting or atrophy  Neurologic: A&O X 3 Psychiatric:  The pt has Normal affect.   Non-Invasive Vascular Imaging:   Carotid  Duplex on 01/10/2020: Right:  40-59% ICA stenosis Left:  60-79% ICA stenosis Vertebrals:  Bilateral vertebral arteries demonstrate antegrade flow. Subclavians: Normal flow hemodynamics were seen in bilateral subclavian arteries.  Previous Carotid duplex on 07/19/2019: Right: 40-59% ICA stenosis Left:   60-79% ICA stenosis Vertebrals:  Bilateral vertebral arteries demonstrate antegrade flow. Subclavians: Normal flow hemodynamics were seen in bilateral subclavian arteries.   EVAR duplex 11/10/2019: Summary: Abdominal Aorta: The largest aortic measurement is 3.6 cm. Patent endovascular aneurysm repair with evidence of endoleak. Psuedoanuerysm sac measures 2.45 x 2.81 cm. No flow appreciated.   ASSESSMENT/PLAN:: 86 y.o. male here for follow up  carotid artery stenosis and hx of EVAR for AAA  -pt doing well and does not have any sx of stroke.  His carotid duplex is essentially unchanged from previous study.   -he will f/u in 6 months with EVAR duplex and carotid duplex.  He will call sooner if he has any issues.  -discussed s/s of stroke with pt and they understand should they develop any of these sx, they will go to the nearest ER.   Leontine Locket, PA-C [facility] [phone]  Clinic MD:  [place]

## 2020-01-09 NOTE — Telephone Encounter (Signed)

## 2020-01-10 ENCOUNTER — Ambulatory Visit (INDEPENDENT_AMBULATORY_CARE_PROVIDER_SITE_OTHER): Payer: Medicare Other | Admitting: Physician Assistant

## 2020-01-10 ENCOUNTER — Other Ambulatory Visit: Payer: Self-pay

## 2020-01-10 ENCOUNTER — Ambulatory Visit (HOSPITAL_COMMUNITY)
Admission: RE | Admit: 2020-01-10 | Discharge: 2020-01-10 | Disposition: A | Discharge status: Home / Self Care | Payer: Medicare Other | Source: Ambulatory Visit | Attending: Surgery | Admitting: Surgery

## 2020-01-10 VITALS — BP 148/72 | HR 69 | Resp 18 | Ht 69.0 in | Wt 156.0 lb

## 2020-01-10 DIAGNOSIS — I6523 Occlusion and stenosis of bilateral carotid arteries: Secondary | ICD-10-CM

## 2020-01-10 DIAGNOSIS — I6529 Occlusion and stenosis of unspecified carotid artery: Secondary | ICD-10-CM | POA: Diagnosis present

## 2020-01-12 ENCOUNTER — Other Ambulatory Visit: Payer: Self-pay | Admitting: *Deleted

## 2020-01-12 DIAGNOSIS — I6523 Occlusion and stenosis of bilateral carotid arteries: Secondary | ICD-10-CM

## 2020-01-30 ENCOUNTER — Ambulatory Visit: Payer: Medicare Other | Admitting: Pain Medicine

## 2020-02-04 NOTE — Progress Notes (Deleted)
[place]  [phone] [phone]) [phone]  ID: Hale Drone OB: April 09, 1926  MR#: [id number]  VCB#:[id number]  Patient Care Team: [name], MD as PCP - General (Family Medicine) Jettie Booze, MD as PCP - Cardiology (Cardiology) Lollie Sails, MD (Inactive) as Consulting Physician (Gastroenterology) Lloyd Huger, MD as Consulting Physician (Oncology)  CHIEF COMPLAINT: MDS, thrombocytopenia  INTERVAL HISTORY: Patient returns to clinic today as an add-on for repeat laboratory work and consideration of Nplate.  He has an enlarging aortic aneurysm requiring repair and his procedure is scheduled for next week.  He currently feels well and is at his baseline. He has no neurologic complaints. He has a good appetite and denies weight loss.  He denies any chest pain, shortness of breath, cough, or hemoptysis.  He has no nausea, vomiting, constipation, or diarrhea.  He has no urinary complaints.  Patient offers no specific complaints today.  REVIEW OF SYSTEMS:   Review of Systems  Constitutional: Negative.  Negative for fever, malaise/fatigue and weight loss.  Respiratory: Negative.  Negative for cough and shortness of breath.   Cardiovascular: Negative.  Negative for chest pain and leg swelling.  Gastrointestinal: Negative for abdominal pain, blood in stool, melena, nausea and vomiting.  Genitourinary: Negative.  Negative for hematuria.  Musculoskeletal: Negative.  Negative for back pain.  Skin: Negative.  Negative for rash.  Neurological: Negative.  Negative for dizziness, sensory change, focal weakness, weakness and headaches.  Endo/Heme/Allergies: Bruises/bleeds easily.  Psychiatric/Behavioral: Negative.  The patient is not nervous/anxious and does not have insomnia.     As per HPI. Otherwise, a complete review of systems is negative.  PAST MEDICAL HISTORY: Past Medical History:  Diagnosis Date  . AAA (abdominal aortic  aneurysm) ([place]) 1999   a. treated with open repair in 1999 with subsequent distal anastomotic pseudoaneurysm that is being followed by Dr. Oneida Alar.  . Adenomatous colon polyp   . Aorta aneurysm ([place])   . Arthritis   . Basal cell cancer Dec. 24, 2013   Nose  . CAD (coronary artery disease)    a. s/p CABG 03/2018.  Marland Kitchen Cancer [facility]) Aug. 2013   prostate Seeding  implant  . Carotid artery occlusion   . Enlarged prostate 2008   Thermotherapy  . Gastroesophageal reflux disease   . GI bleed   . Hyperlipidemia   . Hypertension   . Mild aortic stenosis   . Myelodysplasia (myelodysplastic syndrome) ([place])   . Pancytopenia ([place])    sees Dr. Grayland Ormond  . Peripheral vascular disease ([place])   . Postoperative atrial fibrillation ([place])   . Psoriasis   . S/P CABG (coronary artery bypass graft)     PAST SURGICAL HISTORY: Past Surgical History:  Procedure Laterality Date  . ABDOMINAL AORTIC ANEURYSM REPAIR  1999  . ABDOMINAL AORTIC ENDOVASCULAR STENT GRAFT N/A 09/12/2019   Procedure: ABDOMINAL AORTIC ENDOVASCULAR STENT GRAFT;  Surgeon: Elam Dutch, MD;  Location: [place];  Service: Vascular;  Laterality: N/A;  . CARDIAC CATHETERIZATION  10/2009  . CHOLECYSTECTOMY    . CORONARY ARTERY BYPASS GRAFT N/A 04/08/2018   Procedure: CORONARY ARTERY BYPASS GRAFTING (CABG) x four, using left mammary artery and right leg greater saphenous vein harvested endoscopically.;  Surgeon: Gaye Pollack, MD;  Location: [facility] OR;  Service: Open Heart Surgery;  Laterality: N/A;  . ESOPHAGOGASTRODUODENOSCOPY (EGD) WITH PROPOFOL N/A 06/26/2016   Procedure: ESOPHAGOGASTRODUODENOSCOPY (EGD) WITH PROPOFOL;  Surgeon: Lollie Sails, MD;  Location[facility] ENDOSCOPY;  Service: Endoscopy;  Laterality: N/A;  . EYE SURGERY  1990   cataract surgery bilateral  . LEFT HEART CATH AND CORONARY ANGIOGRAPHY N/A 03/15/2018   Procedure: LEFT HEART CATH AND CORONARY ANGIOGRAPHY;  Surgeon: Jettie Booze, MD;  Location: [place] CV LAB;   Service: Cardiovascular;  Laterality: N/A;  . PROSTATE SURGERY    . SPINE SURGERY  2009   ruptured disc  . TEE WITHOUT CARDIOVERSION N/A 04/08/2018   Procedure: TRANSESOPHAGEAL ECHOCARDIOGRAM (TEE);  Surgeon: Gaye Pollack, MD;  Location: [place];  Service: Open Heart Surgery;  Laterality: N/A;  . TONSILLECTOMY     removed as a teenager  . Visceral angiogram  05/14/2012   Right  groin area  . VISCERAL ANGIOGRAM N/A 05/14/2012   Procedure: VISCERAL ANGIOGRAM;  Surgeon: Elam Dutch, MD;  Location: [facility] CATH LAB;  Service: Cardiovascular;  Laterality: N/A;    FAMILY HISTORY Family History  Problem Relation Age of Onset  . Heart attack Father   . Heart disease Father        After age 29  . COPD Father   . Cancer Sister        Breast and Brain  . Heart disease Brother        before age 4  . Heart attack Brother        ADVANCED DIRECTIVES:    HEALTH MAINTENANCE: Social History   Tobacco Use  . Smoking status: Former Smoker    Types: Cigarettes    Quit date: 04/29/1997    Years since quitting: 22.7  . Smokeless tobacco: Never Used  Substance Use Topics  . Alcohol use: No  . Drug use: No     Allergies  Allergen Reactions  . Ciprofloxacin Other (See Comments)    Abdominal Pain     Current Outpatient Medications  Medication Sig Dispense Refill  . amLODipine (NORVASC) 10 MG tablet Take 0.5 tablets (5 mg total) by mouth daily. 60 tablet 1  . Glucosamine-Chondroitin (COSAMIN DS PO) Take 2 tablets by mouth daily at 12 noon.    . Magnesium 100 MG TABS Take 200 mg by mouth daily with supper.    . Melatonin 3 MG TABS Take 3 mg by mouth at bedtime.    . Multiple Vitamin (MULTIVITAMIN WITH MINERALS) TABS tablet Take 1 tablet by mouth daily at 12 noon. Centrum Silver    . oxyCODONE-acetaminophen (PERCOCET/ROXICET) 5-325 MG tablet Take 1 tablet by mouth every 8 (eight) hours as needed for moderate pain or severe pain.    Marland Kitchen oxyCODONE-acetaminophen (ROXICET) 5-325 MG/5ML  solution SMARTSIG:1 Tablet(s) By Mouth Every 8 Hours PRN    . polyethylene glycol (MIRALAX / GLYCOLAX) packet Take 17 g by mouth daily. (Patient taking differently: Take 17 g by mouth daily as needed for mild constipation. ) 14 each 0  . polyethylene glycol powder (GLYCOLAX/MIRALAX) 17 GM/SCOOP powder Take by mouth.    . rosuvastatin (CRESTOR) 20 MG tablet Take 20 mg by mouth at bedtime.     . tamsulosin (FLOMAX) 0.4 MG CAPS capsule TAKE 1 CAPSULE BY MOUTH EVERY DAY    . valsartan-hydrochlorothiazide (DIOVAN HCT) 160-12.5 MG tablet Take 1 tablet by mouth daily. 30 tablet 1   No current facility-administered medications for this visit.    OBJECTIVE: There were no vitals filed for this visit.   There is no height or weight on file to calculate BMI.    ECOG FS:0 - Asymptomatic  General: Well-developed, well-nourished, no acute distress. Eyes: Pink conjunctiva, anicteric sclera.  HEENT: Normocephalic, moist mucous membranes. Lungs: Clear to auscultation bilaterally. Heart: Regular rate and rhythm. No rubs, murmurs, or gallops. Abdomen: Soft, nontender, nondistended. No organomegaly noted, normoactive bowel sounds. Musculoskeletal: No edema, cyanosis, or clubbing. Neuro: Alert, answering all questions appropriately. Cranial nerves grossly intact. Skin: No rashes or petechiae noted. Psych: Normal affect.  LAB RESULTS:  Lab Results  Component Value Date   NA 139 09/13/2019   K 3.5 09/13/2019   CL 104 09/13/2019   CO2 26 09/13/2019   GLUCOSE 129 (H) 09/13/2019   BUN 17 09/13/2019   CREATININE 0.92 09/13/2019   CALCIUM 8.5 (L) 09/13/2019   PROT 6.5 09/09/2019   ALBUMIN 4.4 09/09/2019   AST 32 09/09/2019   ALT 23 09/09/2019   ALKPHOS 46 09/09/2019   BILITOT 0.9 09/09/2019   GFRNONAA >60 09/13/2019   GFRAA >60 09/13/2019    Lab Results  Component Value Date   WBC 8.0 09/13/2019   NEUTROABS 1.7 09/06/2019   HGB 10.0 (L) 09/13/2019   HCT 29.1 (L) 09/13/2019   MCV 87.9 09/13/2019    PLT 54 (L) 09/13/2019     STUDIES: VAS US CAROTID  Result Date: 01/10/2020 Carotid Arterial Duplex Study Indications: Carotid artery disease. Limitations  Today's exam was limited due to Irregular cadiac cycle. Performing Technologist: Ronal Fear RVS, RCS  Examination Guidelines: A complete evaluation includes B-mode imaging, spectral Doppler, color Doppler, and power Doppler as needed of all accessible portions of each vessel. Bilateral testing is considered an integral part of a complete examination. Limited examinations for reoccurring indications may be performed as noted.  Right Carotid Findings: +----------+--------+--------+--------+------------------+--------+           PSV cm/sEDV cm/sStenosisPlaque DescriptionComments +----------+--------+--------+--------+------------------+--------+ CCA Prox  68      16                                         +----------+--------+--------+--------+------------------+--------+ CCA Mid   63      11                                         +----------+--------+--------+--------+------------------+--------+ CCA Distal84      16                                         +----------+--------+--------+--------+------------------+--------+ ICA Prox  158     44              heterogenous               +----------+--------+--------+--------+------------------+--------+ ICA Mid   176     60      40-59%                             +----------+--------+--------+--------+------------------+--------+ ICA Distal159     33                                         +----------+--------+--------+--------+------------------+--------+ ECA       85      11                                         +----------+--------+--------+--------+------------------+--------+ +----------+--------+-------+----------------+-------------------+  PSV cm/sEDV cmsDescribe        Arm Pressure (mmHG)  +----------+--------+-------+----------------+-------------------+ [id number]            Multiphasic, WNL                    +----------+--------+-------+----------------+-------------------+ +---------+--------+--+--------+-+---------+ VertebralPSV cm/s80EDV cm/s8Antegrade +---------+--------+--+--------+-+---------+  Left Carotid Findings: +----------+--------+--------+--------+------------------+--------+           PSV cm/sEDV cm/sStenosisPlaque DescriptionComments +----------+--------+--------+--------+------------------+--------+ CCA Prox  63      16                                         +----------+--------+--------+--------+------------------+--------+ CCA Mid   122     32              heterogenous               +----------+--------+--------+--------+------------------+--------+ CCA Distal113     19              heterogenous               +----------+--------+--------+--------+------------------+--------+ ICA Prox  87      21              heterogenous               +----------+--------+--------+--------+------------------+--------+ ICA Mid   216     65      60-79%  heterogenous               +----------+--------+--------+--------+------------------+--------+ ICA Distal139     16                                         +----------+--------+--------+--------+------------------+--------+ ECA       124     14                                         +----------+--------+--------+--------+------------------+--------+ +----------+--------+--------+----------------+-------------------+           PSV cm/sEDV cm/sDescribe        Arm Pressure (mmHG) +----------+--------+--------+----------------+-------------------+ [id number]             Multiphasic, WNL                    +----------+--------+--------+----------------+-------------------+ +---------+--------+--+--------+--+---------+ VertebralPSV cm/s91EDV cm/s23Antegrade  +---------+--------+--+--------+--+---------+   Summary: Right Carotid: Velocities in the right ICA are consistent with a 40-59%                stenosis. Left Carotid: Velocities in the left ICA are consistent with a 60-79% stenosis. Vertebrals:  Bilateral vertebral arteries demonstrate antegrade flow. Subclavians: Normal flow hemodynamics were seen in bilateral subclavian              arteries. *See table(s) above for measurements and observations.  Electronically signed by Curt Jews MD on 01/10/2020 at 4:57:57 PM.    Final     ASSESSMENT: MDS, thrombocytopenia.  PLAN:    1. MDS: Confirmed by bone marrow biopsy. 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  Given his absolute increase in monocytes the possibility of an early/evolving CMML cannot be excluded. Cytogenics are normal 46XY. No increase in blast count.  Return to clinic  as previously scheduled. 2. 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  Patient's platelet count has trended down slightly to 32. His platelet count has ranged between 37 and 60 since April 2019.  Upon further review of the chart patient's platelet count has not been above 100 since February 2013.  Proceed with 2 mg/kg Nplate today.  Unclear if this will improve his platelet count in time for his aneurysm repair.  Recommend checking platelet count on day of procedure and if he has not responded appropriately, he will need platelet transfusion prior to and during his aneurysm repair.  Return to clinic as previously scheduled as above.   3.  Prostate cancer: Patient is status post seed implantation.  His last PSA on March 19, 2015 was reported as 0.03.  4.  Cardiac disease/atrial fibrillation: Continue follow-up and treatment per cardiac surgery. 5.  History of GI bleed: Continue monitoring by GI.  Patient has discontinued Aciphex. 6.  Leukopenia: Resolved. 7.  Anemia: Mild, monitor.   Patient expressed understanding  and was in agreement with this plan. He also understands that He can call clinic at any time with any questions, concerns, or complaints.   Lloyd Huger, MD   02/04/2020 10:51 AM

## 2020-02-07 ENCOUNTER — Ambulatory Visit
Admission: RE | Admit: 2020-02-07 | Discharge: 2020-02-07 | Disposition: A | Discharge status: Home / Self Care | Payer: Medicare Other | Source: Ambulatory Visit | Attending: Gastroenterology | Admitting: Gastroenterology

## 2020-02-07 ENCOUNTER — Other Ambulatory Visit: Payer: Self-pay

## 2020-02-07 DIAGNOSIS — R1032 Left lower quadrant pain: Secondary | ICD-10-CM

## 2020-02-07 MED ORDER — IOHEXOL 300 MG/ML  SOLN
100.0000 mL | Freq: Once | INTRAMUSCULAR | Status: AC | PRN
  Administered 2020-02-07: 100 mL via INTRAVENOUS

## 2020-02-09 ENCOUNTER — Ambulatory Visit: Payer: Medicare Other | Admitting: Oncology

## 2020-02-09 ENCOUNTER — Other Ambulatory Visit: Payer: Medicare Other

## 2020-02-10 DIAGNOSIS — R1084 Generalized abdominal pain: Secondary | ICD-10-CM | POA: Insufficient documentation

## 2020-02-15 ENCOUNTER — Telehealth: Payer: Self-pay | Admitting: *Deleted

## 2020-02-15 LAB — POCT I-STAT CREATININE: Creatinine, Ser: 0.9 mg/dL (ref 0.61–1.24)

## 2020-02-15 NOTE — Telephone Encounter (Signed)
Called patient to let him know that his phone call was probably to remind him of his appt on Monday as a new patient with Dr Dossie Arbour.  I asked him If I could go over his history with him.  Patient states he is outside working and would prefer if I could call him at another time.  I told him we would be glad to call him tomorrow to get his information. He states that he will everything ready at that time.

## 2020-02-16 ENCOUNTER — Encounter: Payer: Self-pay | Admitting: Pain Medicine

## 2020-02-16 NOTE — Progress Notes (Signed)
Patient: [name]  Service Category: E/M  Provider: Gaspar Cola, MD  DOB: 12-25-1925  DOS: 02/20/2020  Location: Office  MRN: [id number]  Setting: Ambulatory outpatient  Referring Provider: Geanie Kenning, PA*  Type: New Patient  Specialty: Interventional Pain Management  PCP: [name], MD  Location: Remote location  Delivery: TeleHealth     Virtual Encounter - Pain Management PROVIDER NOTE: Information contained herein reflects review and annotations entered in association with encounter. Interpretation of such information and data should be left to medically-trained personnel. Information provided to patient can be located elsewhere in the medical record under "Patient Instructions". Document created using STT-dictation technology, any transcriptional errors that may result from process are unintentional.    Contact & Pharmacy Preferred: [phone] Home: [phone] (home) Mobile: There is no such number on file (mobile). E-mail: [email or web address]  [id number] [address]Phone: [phone]: [phone]  Pre-screening note:  Our staff contacted Mr. MHunsuckerand offered him an "in person", "face-to-face" appointment versus a telephone encounter. He indicated preferring the telephone encounter, at this time.  Primary Reason(s) for Visit: Tele-Encounter for initial evaluation of one or more chronic problems (new to examiner) potentially causing chronic pain, and posing a threat to normal musculoskeletal function. (Level of risk: High) CC: Abdominal Pain (left)  I contacted Thomas Droneon 02/20/2020 via telephone.      I clearly identified myself as FGaspar Cola MD. I verified that I was speaking with the correct person using two identifiers (Name: [name] and date of birth: [id number].  Advanced Informed Consent I sought verbal advanced consent from Thomas Dronefor virtual visit interactions. I informed [name] possible security and privacy concerns, risks, and limitations associated with providing "not-in-person" medical evaluation and management services. I also informed [name] the availability of "in-person" appointments. Finally, I informed him that there would be a charge for the virtual visit and that he could be  personally, fully or partially, financially responsible for it. Mr. MAnderexpressed understanding and agreed to proceed.   HPI  [name] a 86y.o. year old, male patient, contacted today for an initial evaluation of his chronic pain. 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[place]; On long term drug therapy; Chronic low back pain (1ry area of Pain) (Bilateral) (R>L); DDD (degenerative disc disease), lumbosacral; Drug-induced constipation; Generalized abdominal pain; Malignant neoplasm of prostate (HLake [place]; Other long term (current) drug therapy; Pain in joint of right shoulder; Postlaminectomy syndrome, lumbar region; Purpura ([place]; Sensorineural hearing loss (SNHL) of both ears; Chronic pain syndrome; Pharmacologic therapy; Disorder of skeletal system; Problems influencing health status; Chronic abdominal pain (2ry area of Pain) (LLQ) (Left); Thoracic radiculitis (T11) (Left); DDD (degenerative disc disease), thoracic; Chronic hip pain (Bilateral); Lumbar facet syndrome (Bilateral); Failed back surgical syndrome; and  Diverticulosis of sigmoid colon on their problem list.   Onset and Duration: Sudden and Date of injury: 3 months ago Cause of pain: Reached across a chair arm, pain began at that time Severity:  No change since onset, NAS-11 at its worse: 8/10, NAS-11 at its best: 3/10, NAS-11 now: 5/10 and NAS-11 on the average: 4/10 Timing: Not influenced by the time of the day Aggravating Factors: Bending and Lifiting Alleviating Factors: Resting Previous Examinations or Tests: CT scan Previous Treatments: The patient denies any previous treatments.  According to the patient his primary area of pain is that of the lower back, bilaterally, with the right side being worse than the left.  He does admit to having had some lumbar spine surgery approximately 11 years ago by an orthopedic surgeon from [place]" at the [facility] facility.  Prior to that he had a series of 3 lumbar injections done by Dr. Nelva Bush with no significant benefit.  The patient also refers having had physical therapy many years ago for the low back pain, but it did not help.  Currently he takes Percocet 5/325 1 tablet p.o. twice daily to 3 times daily to keep this pain control.  The patient's second worst pain and the reason why he was consulted to our service is a chronic left lower quadrant abdominal pain that seems to be worse when he sleeps on the left side.  He denies the pain going into the testicular area or groin area.  He also denies any leg pain.  He does indicate pain to go towards his navel, just below and what would be a T11 dermatomal distribution.  He states being very active and exercising on a daily basis even though he is 86 years old.  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  Approximately 4 months ago he went to reach for that he be and started experiencing this sharp pain in the left lower quadrant.  Subsequently he underwent [place] surgery with no problems, but he has continued to have pain especially  when he uses the stationary bike.  He has had multiple studies done including abdominal CTs and abdominal CT angiograms.  He refers that he is still not clear as to what this pain is.  He describes his worst pain is being that of the lower back except when he is on that stationary bike or he attempts to sleep on the left side at which time the pain can go from a 2/10 to an 8/10.  Today we had the patient perform several provocative maneuvers.  Flexion of the lumbar spine increased his low back pain.  Extension increased the low back pain, bilaterally, much more than flexion.  Hyperextension and rotation towards the left side increases the pain the most in the lower back and buttocks area.  Hyperextension and rotation towards the right side also increase some of the pain in the lower back.  Bringing his knee to the chest on the right side did cause some right lower back pain but no abdominal pain.  Doing the same maneuver for the left side interestingly increase the pain in the right lower back, but not the abdominal area.  Straight leg raise on the right side did not trigger any radicular symptoms except for some discomfort over the thigh area.  Indicates of the same maneuver towards the left side the results will likely the same.  Patrick maneuver on the right side did trigger right hip pain, but no low back pain.  Patrick maneuver on the left side also trigger ipsilateral hip pain, but no low back pain.  Neither one reproduce the abdominal pain.  According to the patient he has had a full gastroenterology and vascular  surgery evaluation and a do not seem to think that this pain is vascular or associated with a gastrointestinal tract except for the possibility of the sigmoid diverticulosis found.  Another theoretical possibility is chronic ischemic bowel pain since he recently had a hard to surgery done.  Historic Controlled Substance Pharmacotherapy Review  Current opioid analgesics: Oxycodone-Acetaminophen  5-325  Highest recorded MME/day: 32.14 mg/day MME/day: 22.50 mg/day   Historical Monitoring: The patient  reports no history of drug use. List of all UDS Test(s): No results found. List of other Serum/Urine Drug Screening Test(s):  No results found. Historical Background Evaluation: [place] PMP: PDMP reviewed during this encounter. Two (2) year initial data search conducted.             PMP NARX Score Report:  Narcotic: 381 Sedative: 160 Stimulant: 000 Risk Assessment Profile: PMP NARX Overdose Risk Score: 170  Pharmacologic Plan: As per protocol, I have not taken over any controlled substance management, pending the results of ordered tests and/or consults.            Initial impression: Pending review of available data and ordered tests.  Meds   Current Outpatient Medications:  .  amLODipine (NORVASC) 10 MG tablet, Take 0.5 tablets (5 mg total) by mouth daily., Disp: 60 tablet, Rfl: 1 .  Glucosamine-Chondroitin (COSAMIN DS PO), Take 2 tablets by mouth daily at 12 noon., Disp: , Rfl:  .  Magnesium 100 MG TABS, Take 200 mg by mouth daily with supper., Disp: , Rfl:  .  Melatonin 3 MG TABS, Take 3 mg by mouth at bedtime., Disp: , Rfl:  .  Multiple Vitamin (MULTIVITAMIN WITH MINERALS) TABS tablet, Take 1 tablet by mouth daily at 12 noon. Centrum Silver, Disp: , Rfl:  .  oxyCODONE-acetaminophen (PERCOCET/ROXICET) 5-325 MG tablet, Take 1 tablet by mouth every 8 (eight) hours as needed for moderate pain or severe pain., Disp: , Rfl:  .  polyethylene glycol (MIRALAX / GLYCOLAX) packet, Take 17 g by mouth daily. (Patient taking differently: Take 17 g by mouth daily as needed for mild constipation. ), Disp: 14 each, Rfl: 0 .  rosuvastatin (CRESTOR) 20 MG tablet, Take 20 mg by mouth at bedtime. , Disp: , Rfl:  .  valsartan-hydrochlorothiazide (DIOVAN HCT) 160-12.5 MG tablet, Take 1 tablet by mouth daily., Disp: 30 tablet, Rfl: 1 .  oxyCODONE-acetaminophen (ROXICET) 5-325 MG/5ML solution,  SMARTSIG:1 Tablet(s) By Mouth Every 8 Hours PRN, Disp: , Rfl:  .  polyethylene glycol powder (GLYCOLAX/MIRALAX) 17 GM/SCOOP powder, Take by mouth., Disp: , Rfl:  .  tamsulosin (FLOMAX) 0.4 MG CAPS capsule, TAKE 1 CAPSULE BY MOUTH EVERY DAY, Disp: , Rfl:   ROS  Cardiovascular: High blood pressure and Heart surgery Pulmonary or Respiratory: No reported pulmonary signs or symptoms such as wheezing and difficulty taking a deep full breath (Asthma), difficulty blowing air out (Emphysema), coughing up mucus (Bronchitis), persistent dry cough, or temporary stoppage of breathing during sleep Neurological: No reported neurological signs or symptoms such as seizures, abnormal skin sensations, urinary and/or fecal incontinence, being born with an abnormal open spine and/or a tethered spinal cord Psychological-Psychiatric: No reported psychological or psychiatric signs or symptoms such as difficulty sleeping, anxiety, depression, delusions or hallucinations (schizophrenial), mood swings (bipolar disorders) or suicidal ideations or attempts Gastrointestinal: No reported gastrointestinal signs or symptoms such as vomiting or evacuating blood, reflux, heartburn, alternating episodes of diarrhea and constipation, inflamed or scarred liver, or pancreas or irrregular and/or infrequent bowel movements Genitourinary: No reported renal or  genitourinary signs or symptoms such as difficulty voiding or producing urine, peeing blood, non-functioning kidney, kidney stones, difficulty emptying the bladder, difficulty controlling the flow of urine, or chronic kidney disease Hematological: Low platelet levels (Thrombocytopenia) Endocrine: No reported endocrine signs or symptoms such as high or low blood sugar, rapid heart rate due to high thyroid levels, obesity or weight gain due to slow thyroid or thyroid disease Rheumatologic: No reported rheumatological signs and symptoms such as fatigue, joint pain, tenderness, swelling,  redness, heat, stiffness, decreased range of motion, with or without associated rash Musculoskeletal: Negative for myasthenia gravis, muscular dystrophy, multiple sclerosis or malignant hyperthermia Work History: Retired  Allergies  [name] is allergic to ciprofloxacin.  Laboratory Chemistry Profile   Renal Lab Results  Component Value Date   BUN 17 09/13/2019   CREATININE 0.90 02/07/2020   BCR 14 04/28/2018   GFRAA >60 09/13/2019   GFRNONAA >60 09/13/2019   PROTEINUR NEGATIVE 09/09/2019    Electrolytes Lab Results  Component Value Date   NA 139 09/13/2019   K 3.5 09/13/2019   CL 104 09/13/2019   CALCIUM 8.5 (L) 09/13/2019   MG 2.0 09/12/2019    Hepatic Lab Results  Component Value Date   AST 32 09/09/2019   ALT 23 09/09/2019   ALBUMIN 4.4 09/09/2019   ALKPHOS 46 09/09/2019   LIPASE 36 09/09/2019    ID Lab Results  Component Value Date   SARSCOV2NAA NOT DETECTED 09/10/2019   STAPHAUREUS NEGATIVE 09/08/2019   MRSAPCR NEGATIVE 09/08/2019    Bone No results found.  Endocrine Lab Results  Component Value Date   GLUCOSE 129 (H) 09/13/2019   GLUCOSEU NEGATIVE 09/09/2019   HGBA1C 5.7 (H) 08/16/2018    Neuropathy Lab Results  Component Value Date   VITAMINB12 1,409 (H) 09/02/2016   HGBA1C 5.7 (H) 08/16/2018    CNS No results found.  Inflammation (CRP: Acute  ESR: Chronic) No results found.  Rheumatology No results found.  Coagulation Lab Results  Component Value Date   INR 1.2 09/12/2019   LABPROT 15.4 (H) 09/12/2019   APTT 50 (H) 09/12/2019   PLT 54 (L) 09/13/2019   LABHEMA Note: 04/28/2018    Cardiovascular Lab Results  Component Value Date   TROPONINI <0.03 01/01/2019   HGB 10.0 (L) 09/13/2019   HCT 29.1 (L) 09/13/2019    Screening Lab Results  Component Value Date   SARSCOV2NAA NOT DETECTED 09/10/2019   COVIDSOURCE NASOPHARYNGEAL 09/10/2019   STAPHAUREUS NEGATIVE 09/08/2019   MRSAPCR NEGATIVE 09/08/2019    Cancer No results found.   Allergens No results found.    Note: Lab results reviewed.  Imaging Review  Lumbosacral Imaging: Lumbar DG 2-3 views:  Results for orders placed during the hospital encounter of 10/11/08  DG Lumbar Spine 2-3 Views   Narrative Clinical Data: Preop spinal stenosis.   LUMBAR SPINE - 2-3 VIEW   Comparison: None   Findings: There are five non-rib bearing lumbar segments.  The lowest open interspace is L5-S1.   There is mild lumbar scoliosis convex to the left.  There is advanced disc degeneration at L2-3 and L3-4  and moderate disc degeneration at L1-2 and L4-5.  There is no fracture.  Alignment is normal.   IMPRESSION: Lumbar degenerative changes as above.  Provider: Debby Freiberg   Spine Imaging: CT-Guided Biopsy:  Results for orders placed during the hospital encounter of 08/21/16  CT Biopsy   Narrative INDICATION: Thrombocytopenia.  EXAM: CT BIOPSY  MEDICATIONS: None.  ANESTHESIA/SEDATION: Moderate (conscious)  sedation was employed during this procedure. A total of Versed 1.5 mg and Fentanyl 50 mcg was administered intravenously.  Moderate Sedation Time: 23 minutes. The patient's level of consciousness and vital signs were monitored continuously by radiology nursing throughout the procedure under my direct supervision.  COMPLICATIONS: None immediate.  PROCEDURE: Informed written consent was obtained from the patient after a thorough discussion of the procedural risks, benefits and alternatives. All questions were addressed. Sterile Barrier Technique was utilized including mask, sterile gloves, sterile drape, hand hygiene and skin antiseptic. A timeout was performed prior to the initiation of the procedure.  The patient was placed prone on CT scanner. Right flank region was prepped and draped using sterile technique. Local anesthetic was applied. Under CT guidance, 22 gauge spinal needle was directed toward the posterior margin of right iliac bone.  Lidocaine was injected subperiosteally. Needle was removed. Then, also under CT guidance, trocar was directed through the posterior cortex of right iliac bone. Heparinized and non heparinized bone marrow aspirations were obtained and given to the pathology technologist who was present at that time. Then, bone marrow core biopsy was obtained and also given to the pathology technologist. Needle was removed and appropriate dressing was applied.  IMPRESSION: Under CT guidance, successful percutaneous bone marrow aspiration and core biopsy of right iliac bone.   Electronically Signed   By: Marijo Conception, M.D.   On: 08/21/2016 12:13    Complexity Note: Imaging results reviewed. Results shared with [name], using Layman's terms.                        [place]  Drug: [name]  reports no history of drug use. Alcohol:  reports no history of alcohol use. Tobacco:  reports that he quit smoking about 22 years ago. His smoking use included cigarettes. He has never used smokeless tobacco. Medical:  has a past medical history of AAA (abdominal aortic aneurysm) ([place]) (1999), Adenomatous colon polyp, Aorta aneurysm ([place]), Arthritis, Basal cell cancer (Dec. 24, 2013), CAD (coronary artery disease), Cancer ([place]) (Aug. 2013), Carotid artery occlusion, Enlarged prostate (2008), Gastroesophageal reflux disease, GI bleed, Hyperlipidemia, Hypertension, Mild aortic stenosis, Myelodysplasia (myelodysplastic syndrome) ([place]), Pancytopenia ([place]), Peripheral vascular disease ([place]), Postoperative atrial fibrillation ([place]), Psoriasis, and S/P CABG (coronary artery bypass graft). Family: family history includes COPD in his father; Cancer in his sister; Heart attack in his brother and father; Heart disease in his brother and father.  Past Surgical History:  Procedure Laterality Date  . ABDOMINAL AORTIC ANEURYSM REPAIR  1999  . ABDOMINAL AORTIC ENDOVASCULAR STENT GRAFT N/A 09/12/2019   Procedure: ABDOMINAL  AORTIC ENDOVASCULAR STENT GRAFT;  Surgeon: Elam Dutch, MD;  Location: [facility];  Service: Vascular;  Laterality: N/A;  . CARDIAC CATHETERIZATION  10/2009  . CHOLECYSTECTOMY    . CORONARY ARTERY BYPASS GRAFT N/A 04/08/2018   Procedure: CORONARY ARTERY BYPASS GRAFTING (CABG) x four, using left mammary artery and right leg greater saphenous vein harvested endoscopically.;  Surgeon: Gaye Pollack, MD;  Location: [facility] OR;  Service: Open Heart Surgery;  Laterality: N/A;  . ESOPHAGOGASTRODUODENOSCOPY (EGD) WITH PROPOFOL N/A 06/26/2016   Procedure: ESOPHAGOGASTRODUODENOSCOPY (EGD) WITH PROPOFOL;  Surgeon: Lollie Sails, MD;  [facility] ENDOSCOPY;  Service: Endoscopy;  Laterality: N/A;  . EYE SURGERY  1990   cataract surgery bilateral  . LEFT HEART CATH AND CORONARY ANGIOGRAPHY N/A 03/15/2018   Procedure: LEFT HEART CATH AND CORONARY ANGIOGRAPHY;  Surgeon: Jettie Booze, MD;  Location: [place] [facility] CV LAB;  Service: Cardiovascular;  Laterality: N/A;  . PROSTATE SURGERY    . SPINE SURGERY  2009   ruptured disc  . TEE WITHOUT CARDIOVERSION N/A 04/08/2018   Procedure: TRANSESOPHAGEAL ECHOCARDIOGRAM (TEE);  Surgeon: Gaye Pollack, MD;  Location: [facility];  Service: Open Heart Surgery;  Laterality: N/A;  . TONSILLECTOMY     removed as a teenager  . Visceral angiogram  05/14/2012   Right  groin area  . VISCERAL ANGIOGRAM N/A 05/14/2012   Procedure: VISCERAL ANGIOGRAM;  Surgeon: Elam Dutch, MD;  Location: [facility] CATH LAB;  Service: Cardiovascular;  Laterality: N/A;   Active Ambulatory Problems    Diagnosis Date Noted  . Occlusion and stenosis of carotid artery without mention of cerebral infarction 04/29/2012  . Chronic vascular insufficiency of intestine (HCC) 05/24/2012  . Abdominal pain, right lower quadrant 05/24/2012  . Anemia 05/06/2016  . Thrombocytopenia ([place]) 08/17/2016  . MDS (myelodysplastic syndrome) ([place]) 09/01/2016  . Aneurysm of abdominal vessel ([place]) 07/23/2016  .  Arthritis 01/26/2017  . Benign essential hypertension 08/07/2015  . Bilateral carotid artery stenosis 08/07/2015  . CAD (coronary artery disease) 08/07/2015  . Hyperlipidemia, mixed 08/07/2015  . Personal history of other malignant neoplasm of skin 12/13/2012  . Angina pectoris ([place])   . S/P CABG x 4 04/08/2018  . Atrial fibrillation ([place]) 04/13/2018  . 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[place]) 09/12/2019  . On long term drug therapy 12/26/2017  . Chronic low back pain (1ry area of Pain) (Bilateral) (R>L) 11/03/2017  . DDD (degenerative disc disease), lumbosacral 12/01/2019  . Drug-induced constipation 11/03/2017  . Generalized abdominal pain 02/10/2020  . Malignant neoplasm of prostate ([place]) 05/27/2012  . Other long term (current) drug therapy 12/26/2017  . Pain in joint of right shoulder 12/26/2017  . Postlaminectomy syndrome, lumbar region 12/26/2017  . Purpura ([place]) 11/03/2017  . Sensorineural hearing loss (SNHL) of both ears 11/03/2017  . Chronic pain syndrome 12/26/2017  . Pharmacologic therapy 02/20/2020  . Disorder of skeletal system 02/20/2020  . Problems influencing health status 02/20/2020  . Chronic abdominal pain (2ry area of Pain) (LLQ) (Left) 02/20/2020  . Thoracic radiculitis (T11) (Left) 02/20/2020  . DDD (degenerative disc disease), thoracic 02/20/2020  . Chronic hip pain (Bilateral) 02/20/2020  . Lumbar facet syndrome (Bilateral) 02/20/2020  . 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  . Diverticulosis of sigmoid colon 02/20/2020   Resolved Ambulatory Problems    Diagnosis Date Noted  . No Resolved Ambulatory Problems   Past Medical History:  Diagnosis Date  . AAA (abdominal aortic aneurysm) ([place]) 1999  . Adenomatous colon polyp   . Aorta aneurysm ([place])   . Basal cell cancer Dec. 24, 2013  . Cancer [facility]) Aug. 2013  . Carotid artery occlusion   . Enlarged prostate 2008  . Gastroesophageal reflux disease   . GI bleed   . Hyperlipidemia   . Hypertension   . Mild  aortic stenosis   . Myelodysplasia (myelodysplastic syndrome) ([place])   . Pancytopenia ([place])   . Peripheral vascular disease ([place])   . Postoperative atrial fibrillation ([place])   . Psoriasis   . S/P CABG (coronary artery bypass graft)    Assessment  Primary Diagnosis & Pertinent Problem List: The primary encounter diagnosis was Chronic low back pain (1ry area of Pain) (Bilateral) (R>L). Diagnoses of Lumbar facet syndrome (Bilateral), DDD (degenerative disc disease), lumbosacral, Chronic abdominal pain (2ry area of Pain) (LLQ) (Left), Chronic vascular insufficiency of intestine ([place]), Diverticulosis of sigmoid colon,  DDD (degenerative disc disease), thoracic, Thoracic radiculitis (T11) (Left), Chronic hip pain (Bilateral), Chronic pain syndrome, Pharmacologic therapy, Disorder of skeletal system, Problems influencing health status, Thrombocytopenia ([place]), and Failed back surgical syndrome were also pertinent to this visit.  Visit Diagnosis (New problems to examiner): 1. Chronic low back pain (1ry area of Pain) (Bilateral) (R>L)   2. Lumbar facet syndrome (Bilateral)   3. DDD (degenerative disc disease), lumbosacral   4. Chronic abdominal pain (2ry area of Pain) (LLQ) (Left)   5. Chronic vascular insufficiency of intestine (HCC)   6. Diverticulosis of sigmoid colon   7. DDD (degenerative disc disease), thoracic   8. Thoracic radiculitis (T11) (Left)   9. Chronic hip pain (Bilateral)   10. Chronic pain syndrome   11. Pharmacologic therapy   12. Disorder of skeletal system   13. Problems influencing health status   14. Thrombocytopenia ([place])   15. Failed back surgical syndrome    Plan of Care (Initial workup plan)  Note: [name] was reminded that as per protocol, today's visit has been an evaluation only. We have not taken over the patient's controlled substance management.  Problem-specific plan: No problem-specific Assessment & Plan notes found for this encounter.   Lab Orders      Compliance Drug Analysis, Ur     Comp. Metabolic Panel (12)     Vitamin B12     Sedimentation rate     25-Hydroxy vitamin D Lcms D2+D3     C-reactive protein     Platelet count     Platelet function assay  Imaging Orders     DG Lumbar Spine Complete W/Bend     DG Thoracic Spine 2 View     DG HIP UNILAT W OR W/O PELVIS 2-3 VIEWS RIGHT     DG HIP UNILAT W OR W/O PELVIS 2-3 VIEWS LEFT Referral Orders  No referral(s) requested today   Procedure Orders    No procedure(s) ordered today   Pharmacotherapy (current): Medications ordered:  No orders of the defined types were placed in this encounter.    Pharmacological management options:  Opioid Analgesics: The patient was informed that there is no guarantee that he would be a candidate for opioid analgesics. The decision will be made following CDC guidelines. This decision will be based on the results of diagnostic studies, as well as [name]'s risk profile.   Membrane stabilizer: To be determined at a later time  Muscle relaxant: To be determined at a later time  NSAID: To be determined at a later time  Other analgesic(s): To be determined at a later time   Interventional management options: [name] was informed that there is no guarantee that he would be a candidate for interventional therapies. The decision will be based on the results of diagnostic studies, as well as [name] risk profile.  Procedure(s) under consideration:  Diagnostic celiac plexus block  Diagnostic bilateral lumbar facet block  Possible bilateral lumbar facet RFA  Diagnostic bilateral intra-articular hip joint injection  Diagnostic left upper lumbar ESI  Diagnostic left lower thoracic ESI    Provider-requested follow-up: Return for (VV), (2V), (s/p Tests).  Future Appointments  Date Time Provider [place]  03/09/2020 12:45 PM [facility] None  03/09/2020  1:00 PM Finnegan, Kathlene November, MD [facility] None   Total  duration of encounter: 32 minutes.  Primary Care Physician: [name], MD Note by: Gaspar Cola, MD Date: 02/20/2020; Time: 3:40 PM

## 2020-02-20 ENCOUNTER — Ambulatory Visit: Payer: Medicare Other | Attending: Pain Medicine | Admitting: Pain Medicine

## 2020-02-20 ENCOUNTER — Other Ambulatory Visit: Payer: Self-pay

## 2020-02-20 DIAGNOSIS — M545 Low back pain, unspecified: Secondary | ICD-10-CM

## 2020-02-20 DIAGNOSIS — D696 Thrombocytopenia, unspecified: Secondary | ICD-10-CM

## 2020-02-20 DIAGNOSIS — Z79899 Other long term (current) drug therapy: Secondary | ICD-10-CM

## 2020-02-20 DIAGNOSIS — M5136 Other intervertebral disc degeneration, lumbar region with discogenic back pain only: Secondary | ICD-10-CM | POA: Insufficient documentation

## 2020-02-20 DIAGNOSIS — M47816 Spondylosis without myelopathy or radiculopathy, lumbar region: Secondary | ICD-10-CM | POA: Insufficient documentation

## 2020-02-20 DIAGNOSIS — M25551 Pain in right hip: Secondary | ICD-10-CM

## 2020-02-20 DIAGNOSIS — M961 Postlaminectomy syndrome, not elsewhere classified: Secondary | ICD-10-CM

## 2020-02-20 DIAGNOSIS — K5730 Diverticulosis of large intestine without perforation or abscess without bleeding: Secondary | ICD-10-CM | POA: Insufficient documentation

## 2020-02-20 DIAGNOSIS — M899 Disorder of bone, unspecified: Secondary | ICD-10-CM

## 2020-02-20 DIAGNOSIS — R1032 Left lower quadrant pain: Secondary | ICD-10-CM

## 2020-02-20 DIAGNOSIS — M5137 Other intervertebral disc degeneration, lumbosacral region: Secondary | ICD-10-CM | POA: Diagnosis not present

## 2020-02-20 DIAGNOSIS — M5414 Radiculopathy, thoracic region: Secondary | ICD-10-CM | POA: Insufficient documentation

## 2020-02-20 DIAGNOSIS — M25552 Pain in left hip: Secondary | ICD-10-CM

## 2020-02-20 DIAGNOSIS — G894 Chronic pain syndrome: Secondary | ICD-10-CM

## 2020-02-20 DIAGNOSIS — M5134 Other intervertebral disc degeneration, thoracic region: Secondary | ICD-10-CM | POA: Insufficient documentation

## 2020-02-20 DIAGNOSIS — K551 Chronic vascular disorders of intestine: Secondary | ICD-10-CM

## 2020-02-20 DIAGNOSIS — Z789 Other specified health status: Secondary | ICD-10-CM

## 2020-02-20 DIAGNOSIS — G8929 Other chronic pain: Secondary | ICD-10-CM

## 2020-02-23 ENCOUNTER — Ambulatory Visit
Admission: RE | Admit: 2020-02-23 | Discharge: 2020-02-23 | Disposition: A | Discharge status: Home / Self Care | Payer: Medicare Other | Source: Ambulatory Visit | Attending: Pain Medicine | Admitting: Pain Medicine

## 2020-02-23 ENCOUNTER — Other Ambulatory Visit: Payer: Self-pay

## 2020-02-23 ENCOUNTER — Ambulatory Visit
Admission: RE | Admit: 2020-02-23 | Discharge: 2020-02-23 | Disposition: A | Discharge status: Home / Self Care | Payer: Medicare Other | Attending: Pain Medicine | Admitting: Pain Medicine

## 2020-02-23 ENCOUNTER — Other Ambulatory Visit
Admission: RE | Admit: 2020-02-23 | Discharge: 2020-02-23 | Disposition: A | Discharge status: Home / Self Care | Payer: Medicare Other | Source: Home / Self Care | Attending: Pain Medicine | Admitting: Pain Medicine

## 2020-02-23 DIAGNOSIS — M25551 Pain in right hip: Secondary | ICD-10-CM | POA: Insufficient documentation

## 2020-02-23 DIAGNOSIS — R1032 Left lower quadrant pain: Secondary | ICD-10-CM | POA: Diagnosis not present

## 2020-02-23 DIAGNOSIS — M25552 Pain in left hip: Secondary | ICD-10-CM | POA: Insufficient documentation

## 2020-02-23 DIAGNOSIS — D696 Thrombocytopenia, unspecified: Secondary | ICD-10-CM | POA: Insufficient documentation

## 2020-02-23 DIAGNOSIS — M5414 Radiculopathy, thoracic region: Secondary | ICD-10-CM

## 2020-02-23 DIAGNOSIS — G894 Chronic pain syndrome: Secondary | ICD-10-CM | POA: Insufficient documentation

## 2020-02-23 DIAGNOSIS — M545 Low back pain: Secondary | ICD-10-CM | POA: Insufficient documentation

## 2020-02-23 DIAGNOSIS — M899 Disorder of bone, unspecified: Secondary | ICD-10-CM | POA: Insufficient documentation

## 2020-02-23 DIAGNOSIS — M5137 Other intervertebral disc degeneration, lumbosacral region: Secondary | ICD-10-CM

## 2020-02-23 DIAGNOSIS — M5134 Other intervertebral disc degeneration, thoracic region: Secondary | ICD-10-CM | POA: Diagnosis not present

## 2020-02-23 DIAGNOSIS — Z79899 Other long term (current) drug therapy: Secondary | ICD-10-CM | POA: Insufficient documentation

## 2020-02-23 DIAGNOSIS — M4316 Spondylolisthesis, lumbar region: Secondary | ICD-10-CM | POA: Insufficient documentation

## 2020-02-23 DIAGNOSIS — M8588 Other specified disorders of bone density and structure, other site: Secondary | ICD-10-CM | POA: Insufficient documentation

## 2020-02-23 DIAGNOSIS — G8929 Other chronic pain: Secondary | ICD-10-CM

## 2020-02-23 LAB — PLATELET FUNCTION ASSAY
Collagen / ADP: >300 seconds — ABNORMAL HIGH (ref 0–118)
Collagen / Epinephrine: >300 seconds — ABNORMAL HIGH (ref 0–193)

## 2020-02-23 LAB — COMPREHENSIVE METABOLIC PANEL
ALT: 23 U/L (ref 0–44)
AST: 31 U/L (ref 15–41)
Albumin: 4.8 g/dL (ref 3.5–5.0)
Alkaline Phosphatase: 48 U/L (ref 38–126)
Anion gap: 10 (ref 5–15)
BUN: 26 mg/dL — ABNORMAL HIGH (ref 8–23)
CO2: 26 mmol/L (ref 22–32)
Calcium: 9.5 mg/dL (ref 8.9–10.3)
Chloride: 102 mmol/L (ref 98–111)
Creatinine, Ser: 1 mg/dL (ref 0.61–1.24)
GFR calc Af Amer: >60 mL/min (ref >60)
GFR calc non Af Amer: >60 mL/min (ref >60)
Glucose, Bld: 99 mg/dL (ref 70–99)
Potassium: 3.8 mmol/L (ref 3.5–5.1)
Sodium: 138 mmol/L (ref 135–145)
Total Bilirubin: 1.2 mg/dL (ref 0.3–1.2)
Total Protein: 7.1 g/dL (ref 6.5–8.1)

## 2020-02-23 LAB — URINE DRUG SCREEN, QUALITATIVE (ARMC ONLY)
Amphetamines, Ur Screen: NOT DETECTED
Barbiturates, Ur Screen: NOT DETECTED
Benzodiazepine, Ur Scrn: NOT DETECTED
Cannabinoid 50 Ng, Ur ~~LOC~~: NOT DETECTED
Cocaine Metabolite,Ur ~~LOC~~: NOT DETECTED
MDMA (Ecstasy)Ur Screen: NOT DETECTED
Methadone Scn, Ur: NOT DETECTED
Opiate, Ur Screen: NOT DETECTED
Phencyclidine (PCP) Ur S: NOT DETECTED
Tricyclic, Ur Screen: NOT DETECTED

## 2020-02-23 LAB — C-REACTIVE PROTEIN: CRP: <0.5 mg/dL (ref <1.0)

## 2020-02-23 LAB — PLATELET COUNT: Platelets: 36 10*3/uL — ABNORMAL LOW (ref 150–400)

## 2020-02-23 LAB — SEDIMENTATION RATE: Sed Rate: 7 mm/hr (ref 0–20)

## 2020-02-23 LAB — VITAMIN B12: Vitamin B-12: 740 pg/mL (ref 180–914)

## 2020-02-23 LAB — VITAMIN D 25 HYDROXY (VIT D DEFICIENCY, FRACTURES): Vit D, 25-Hydroxy: 26.72 ng/mL — ABNORMAL LOW (ref 30–100)

## 2020-02-29 ENCOUNTER — Encounter: Payer: Self-pay | Admitting: Pain Medicine

## 2020-02-29 NOTE — Progress Notes (Signed)
Patient: [name]  Service Category: E/M  Provider: Gaspar Cola, MD  DOB: 1926-08-03  DOS: 03/01/2020  Location: Office  MRN: [id number]  Setting: Ambulatory outpatient  Referring Provider: [name], MD  Type: Established Patient  Specialty: Interventional Pain Management  PCP: [name], MD  Location: Remote location  Delivery: TeleHealth     Virtual Encounter - Pain Management PROVIDER NOTE: Information contained herein reflects review and annotations entered in association with encounter. Interpretation of such information and data should be left to medically-trained personnel. Information provided to patient can be located elsewhere in the medical record under "Patient Instructions". Document created using STT-dictation technology, any transcriptional errors that may result from process are unintentional.    Contact & Pharmacy Preferred: [phone] Home: [phone] (home) Mobile: There is no such number on file (mobile). E-mail: [email or web address]  [id number] [address]Phone: [phone]: [phone]  Pre-screening note:  Our staff contacted Mr. MBogusand offered him an "in person", "face-to-face" appointment versus a telephone encounter. He indicated preferring the telephone encounter, at this time.   Primary Reason(s) for Virtual Visit: Encounter for evaluation before starting new chronic pain management plan of care (Level of risk: moderate) COVID-19*  Social distancing based on CDC ans AMA recommendations.    I contacted WHale Droneon 03/01/2020 via telephone.      I clearly identified myself as FGaspar Cola MD. I verified that I was speaking with the correct person using two identifiers (Name: WTHIERNO HUN and date of birth: [birth year].  Advanced Informed Consent I sought verbal advanced consent from WHale Dronefor virtual visit  interactions. I informed Mr. MStockleyof possible security and privacy concerns, risks, and limitations associated with providing "not-in-person" medical evaluation and management services. I also informed Mr. MSchauerof the availability of "in-person" appointments. Finally, I informed him that there would be a charge for the virtual visit and that he could be  personally, fully or partially, financially responsible for it. Mr. MVogelexpressed understanding and agreed to proceed.   Historic Elements   Mr. WHAYDAN MANSOURIis a 86y.o. year old, male patient evaluated today after his last encounter by our practice on 02/20/2020. Mr. MGabriel has a past medical history of AAA (abdominal aortic aneurysm) ([place] (1999), Adenomatous colon polyp, Aorta aneurysm ([place], Arthritis, Basal cell cancer (Dec. 24, 2013), CAD (coronary artery disease), Cancer ([place] (Aug. [place]), Carotid artery occlusion, Enlarged prostate (2008), Gastroesophageal reflux disease, GI bleed, Hyperlipidemia, Hypertension, Mild aortic stenosis, Myelodysplasia (myelodysplastic syndrome) ([place], Pancytopenia ([place], Peripheral vascular disease ([place], Postoperative atrial fibrillation ([place], Psoriasis, and S/P CABG (coronary artery bypass graft). He also  has a past surgical history that includes Cholecystectomy; Cardiac catheterization (10/2009); Spine surgery (2009); Visceral angiogram (05/14/2012); Abdominal aortic aneurysm repair (1999); visceral angiogram (N/A, 05/14/2012); Esophagogastroduodenoscopy (egd) with propofol (N/A, 06/26/2016); Prostate surgery; LEFT HEART CATH AND CORONARY ANGIOGRAPHY (N/A, 03/15/2018); Coronary artery bypass graft (N/A, 04/08/2018); TEE without cardioversion (N/A, 04/08/2018); Eye surgery (1990); Tonsillectomy; and Abdominal aortic endovascular stent graft (N/A, 09/12/2019). Mr. MRepasshas a current medication list which includes the following prescription(s): amlodipine, glucosamine-chondroitin, magnesium, melatonin,  multivitamin with minerals, oxycodone-acetaminophen, polyethylene glycol, rosuvastatin, tamsulosin, and valsartan-hydrochlorothiazide. He  reports that he quit smoking about 22 years ago. His smoking use included cigarettes. He has never used smokeless tobacco. He reports that he does not drink alcohol or use drugs. Mr. MChewis allergic  to ciprofloxacin.   HPI  He is being evaluated for review of studies ordered on initial visit and to consider treatment plan options. Today I went over the results of his tests. These were explained in "Layman's terms". During today's appointment I went over my diagnostic impression, as well as the proposed treatment plan.  According to the patient his primary area of pain is that of the lower back, bilaterally, with the right side being worse than the left.  He does admit to having had some lumbar spine surgery approximately 11 years ago by an orthopedic surgeon from "[place]" at the [facility] facility.  Prior to that he had a series of 3 lumbar injections done by Dr. Nelva Bush with no significant benefit.  The patient also refers having had physical therapy many years ago for the low back pain, but it did not help.  Currently he takes Percocet 5/325 1 tablet p.o. twice daily to 3 times daily to keep this pain control.  The patient's second worst pain and the reason why he was consulted to our service is a chronic left lower quadrant abdominal pain that seems to be worse when he sleeps on the left side.  He denies the pain going into the testicular area or groin area.  He also denies any leg pain.  He does indicate pain to go towards his navel, just below and what would be a T11 dermatomal distribution.  He states being very active and exercising on a daily basis even though he is 86 years old.  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  Approximately 4 months ago he went to reach for that he be and started experiencing this sharp pain in the left  lower quadrant.  Subsequently he underwent Aortic surgery with no problems, but he has continued to have pain especially when he uses the stationary bike.  He has had multiple studies done including abdominal CTs and abdominal CT angiograms.  He refers that he is still not clear as to what this pain is.  He describes his worst pain is being that of the lower back except when he is on that stationary bike or he attempts to sleep on the left side at which time the pain can go from a 2/10 to an 8/10.  The patient performed several provocative maneuvers.  Flexion of the lumbar spine increased his low back pain.  Extension increased the low back pain, bilaterally, much more than flexion.  Hyperextension and rotation towards the left side increases the pain the most in the lower back and buttocks area.  Hyperextension and rotation towards the right side also increase some of the pain in the lower back.  Bringing his knee to the chest on the right side did cause some right lower back pain but no abdominal pain.  Doing the same maneuver for the left side interestingly increase the pain in the right lower back, but not the abdominal area.  Straight leg raise on the right side did not trigger any radicular symptoms except for some discomfort over the thigh area.  Indicates of the same maneuver towards the left side the results will likely the same.  Patrick maneuver on the right side did trigger right hip pain, but no low back pain.  Patrick maneuver on the left side also trigger ipsilateral hip pain, but no low back pain.  Neither one reproduce the abdominal pain.  According to the patient he has had a full gastroenterology and vascular surgery evaluation  and a do not seem to think that this pain is vascular or associated with a gastrointestinal tract except for the possibility of the sigmoid diverticulosis found.  Another theoretical possibility is chronic ischemic bowel pain since he recently had a hard to surgery  done.  Unfortunately, the patient is hard of hearing and because of this he called earlier today and asked to have a face-to-face appointment so that he could bring his son to help him understand what he has and what the plan is.  Unfortunately, his son was not available today and they scheduled a face-to-face appointment for 03/15/2020.  I was going to simply go over things at that time, but the patient asked if I could go over some of that today.  I did, but clearly he is very hard of hearing as he had a lot of difficulty understanding that he has a vitamin D deficiency.  He initially understood that he had a vitamin B deficiency, which I attempted to correct, just to have him say that he understood he had a vitamin C deficiency.  Eventually, his wife was close by and she heard [place] say vitamin D and she explained that to him.  For this reason and to make sure that they hear and understand everything that I have to say, today I gave him a very short summary but informed him that I would go over it in more detail when he brings his son on 03/15/2020.  Controlled Substance Pharmacotherapy Assessment REMS (Risk Evaluation and Mitigation Strategy)  Analgesic: Oxycodone-Acetaminophen 5-325  Highest recorded MME/day: 32.14 mg/day MME/day: 22.50 mg/day   Monitoring: [place] PMP: PDMP reviewed during this encounter.       Not applicable at this point since we have not taken over the patient's medication management yet. List of other Serum/Urine Drug Screening Test(s):  Lab Results  Component Value Date   COCAINSCRNUR NONE DETECTED 02/23/2020   THCU NONE DETECTED 02/23/2020   List of all UDS test(s) done:  No results found. Last UDS on record: No results found. UDS interpretation: No unexpected findings.          Medication Assessment Form: Patient introduced to form today Treatment compliance: Treatment may start today if patient agrees with proposed plan. Evaluation of compliance is not applicable at this  point Risk Assessment Profile: Aberrant behavior: See initial evaluations. None observed or detected today Comorbid factors increasing risk of overdose: See initial evaluation. No additional risks detected today Opioid risk tool (ORT):  Opioid Risk  02/16/2020  Alcohol 0  Illegal Drugs 0  Rx Drugs 0  Alcohol 0  Illegal Drugs 0  Rx Drugs 0  Age between 16-45 years  0  History of Preadolescent Sexual Abuse 0  Psychological Disease 0  Depression 0  Opioid Risk Tool Scoring 0  Opioid Risk Interpretation Low Risk    ORT Scoring interpretation table:  Score <3 = Low Risk for SUD  Score between 4-7 = Moderate Risk for SUD  Score >8 = High Risk for Opioid Abuse   Risk of substance use disorder (SUD): Low  Risk Mitigation Strategies:  Patient opioid safety counseling: Completed today. Counseling provided to patient as per "Patient Counseling Document". Document signed by patient, attesting to counseling and understanding Patient-Prescriber Agreement (PPA): Obtained today.  Controlled substance notification to other providers: Written and sent today.  Pharmacologic Plan: Today we may be taking over the patient's pharmacological regimen. See below.  Meds   Current Outpatient Medications:  .  amLODipine (NORVASC) 10 MG tablet, Take 0.5 tablets (5 mg total) by mouth daily., Disp: 60 tablet, Rfl: 1 .  Glucosamine-Chondroitin (COSAMIN DS PO), Take 2 tablets by mouth daily at 12 noon., Disp: , Rfl:  .  Magnesium 100 MG TABS, Take 200 mg by mouth daily with supper., Disp: , Rfl:  .  Melatonin 3 MG TABS, Take 5 mg by mouth at bedtime. , Disp: , Rfl:  .  Multiple Vitamin (MULTIVITAMIN WITH MINERALS) TABS tablet, Take 1 tablet by mouth daily at 12 noon. Centrum Silver, Disp: , Rfl:  .  oxyCODONE-acetaminophen (PERCOCET/ROXICET) 5-325 MG tablet, Take 1 tablet by mouth every 8 (eight) hours as needed for moderate pain or severe pain., Disp: , Rfl:  .  polyethylene glycol (MIRALAX /  GLYCOLAX) packet, Take 17 g by mouth daily. (Patient taking differently: Take 17 g by mouth daily as needed for mild constipation. ), Disp: 14 each, Rfl: 0 .  rosuvastatin (CRESTOR) 20 MG tablet, Take 20 mg by mouth at bedtime. , Disp: , Rfl:  .  tamsulosin (FLOMAX) 0.4 MG CAPS capsule, TAKE 1 CAPSULE BY MOUTH EVERY DAY, Disp: , Rfl:  .  valsartan-hydrochlorothiazide (DIOVAN HCT) 160-12.5 MG tablet, Take 1 tablet by mouth daily., Disp: 30 tablet, Rfl: 1  Laboratory Chemistry Profile   Renal Lab Results  Component Value Date   BUN 26 (H) 02/23/2020   CREATININE 1.00 02/23/2020   BCR 14 04/28/2018   GFRAA >60 02/23/2020   GFRNONAA >60 02/23/2020   PROTEINUR NEGATIVE 09/09/2019    Electrolytes Lab Results  Component Value Date   NA 138 02/23/2020   K 3.8 02/23/2020   CL 102 02/23/2020   CALCIUM 9.5 02/23/2020   MG 2.0 09/12/2019    Hepatic Lab Results  Component Value Date   AST 31 02/23/2020   ALT 23 02/23/2020   ALBUMIN 4.8 02/23/2020   ALKPHOS 48 02/23/2020   LIPASE 36 09/09/2019    ID Lab Results  Component Value Date   SARSCOV2NAA NOT DETECTED 09/10/2019   STAPHAUREUS NEGATIVE 09/08/2019   MRSAPCR NEGATIVE 09/08/2019    Bone Lab Results  Component Value Date   VD25OH 26.72 (L) 02/23/2020    Endocrine Lab Results  Component Value Date   GLUCOSE 99 02/23/2020   GLUCOSEU NEGATIVE 09/09/2019   HGBA1C 5.7 (H) 08/16/2018    Neuropathy Lab Results  Component Value Date   VITAMINB12 740 02/23/2020   HGBA1C 5.7 (H) 08/16/2018    CNS No results found.  Inflammation (CRP: Acute  ESR: Chronic) Lab Results  Component Value Date   CRP <0.5 02/23/2020   ESRSEDRATE 7 02/23/2020    Rheumatology No results found.  Coagulation Lab Results  Component Value Date   INR 1.2 09/12/2019   LABPROT 15.4 (H) 09/12/2019   APTT 50 (H) 09/12/2019   PLT 36 (L) 02/23/2020   LABHEMA Note: 04/28/2018    Cardiovascular Lab Results  Component Value Date   TROPONINI <0.03  01/01/2019   HGB 10.0 (L) 09/13/2019   HCT 29.1 (L) 09/13/2019    Screening Lab Results  Component Value Date   SARSCOV2NAA NOT DETECTED 09/10/2019   COVIDSOURCE NASOPHARYNGEAL 09/10/2019   STAPHAUREUS NEGATIVE 09/08/2019   MRSAPCR NEGATIVE 09/08/2019    Cancer No results found.  Allergens No results found.     Note: Lab results reviewed.  Recent Diagnostic Imaging Review  Thoracic Imaging: Thoracic DG 2-3 views:  Results for orders placed during  the hospital encounter of 02/23/20  DG Thoracic Spine 2 View   Narrative CLINICAL DATA:  Back pain.  EXAM: THORACIC SPINE 2 VIEWS  COMPARISON:  CT 02/07/2020.  Chest x-ray 09/12/2019, 05/05/2018.  FINDINGS: Prior CABG. Surgical clips right upper quadrant. Visceral vascular calcification. Diffuse osteopenia. Mild multilevel degenerative change. No acute bony abnormality identified.  IMPRESSION: Diffuse osteopenia. Mild multilevel degenerative change. No acute abnormality identified.   Electronically Signed   By: Marcello Moores  Register   On: 02/24/2020 07:39    Lumbosacral Imaging: Lumbar DG 2-3 views:  Results for orders placed during the hospital encounter of 10/11/08  DG Lumbar Spine 2-3 Views   Narrative Clinical Data: Preop spinal stenosis.   LUMBAR SPINE - 2-3 VIEW   Comparison: None   Findings: There are five non-rib bearing lumbar segments.  The lowest open interspace is L5-S1.   There is mild lumbar scoliosis convex to the left.  There is advanced disc degeneration at L2-3 and L3-4  and moderate disc degeneration at L1-2 and L4-5.  There is no fracture.  Alignment is normal.   IMPRESSION: Lumbar degenerative changes as above.  Provider: Debby Freiberg         Lumbar DG Bending views:  Results for orders placed during the hospital encounter of 02/23/20  DG Lumbar Spine Complete W/Bend   Narrative CLINICAL DATA:  Low back pain.  EXAM: LUMBAR SPINE - COMPLETE WITH BENDING VIEWS  COMPARISON:  CT  02/07/2020.  Lumbar spine 10/06/2008.  FINDINGS: Lumbar spine numbered the lowest segmented appearing lumbar shaped vertebrae on lateral view as L5. Surgical clips right upper quadrant. Visceral vascular calcification again noted. Aortoiliac stent graft noted. Diffuse osteopenia. Lumbar spine scoliosis. Diffuse severe multilevel disc degeneration. Complete or near complete loss of disc space noted at L1-L2, L2-L3, L3-L4, and L4-L5. Endplate osteophyte formation and endplate sclerosis noted at all levels. Diffuse facet hypertrophy present particularly prominent at L4-L5 and L5-S1. 3 mm anterolisthesis L5 on S1. No flexion or extension deformity noted. No acute bony abnormality identified. No evidence of fracture.  IMPRESSION: 1. Diffuse osteopenia. Lumbar spine scoliosis. Diffuse severe multilevel degenerative change lumbar spine as above. 3 mm anterolisthesis L5 on S1. No flexion or extension deformity noted.  2. Aortoiliac stent grafts. Visceral atherosclerotic vascular calcification noted.   Electronically Signed   By: Marcello Moores  Register   On: 02/24/2020 07:31          Spine Imaging: CT-Guided Biopsy:  Results for orders placed during the hospital encounter of 08/21/16  CT Biopsy   Narrative INDICATION: Thrombocytopenia.  EXAM: CT BIOPSY  MEDICATIONS: None.  ANESTHESIA/SEDATION: Moderate (conscious) sedation was employed during this procedure. A total of Versed 1.5 mg and Fentanyl 50 mcg was administered intravenously.  Moderate Sedation Time: 23 minutes. The patient's level of consciousness and vital signs were monitored continuously by radiology nursing throughout the procedure under my direct supervision.  COMPLICATIONS: None immediate.  PROCEDURE: Informed written consent was obtained from the patient after a thorough discussion of the procedural risks, benefits and alternatives. All questions were addressed. Sterile Barrier Technique was utilized  including mask, sterile gloves, sterile drape, hand hygiene and skin antiseptic. A timeout was performed prior to the initiation of the procedure.  The patient was placed prone on CT scanner. Right flank region was prepped and draped using sterile technique. Local anesthetic was applied. Under CT guidance, 22 gauge spinal needle was directed toward the posterior margin of right iliac bone. Lidocaine was injected subperiosteally. Needle was removed. Then,  also under CT guidance, trocar was directed through the posterior cortex of right iliac bone. Heparinized and non heparinized bone marrow aspirations were obtained and given to the pathology technologist who was present at that time. Then, bone marrow core biopsy was obtained and also given to the pathology technologist. Needle was removed and appropriate dressing was applied.  IMPRESSION: Under CT guidance, successful percutaneous bone marrow aspiration and core biopsy of right iliac bone.   Electronically Signed   By: Marijo Conception, M.D.   On: 08/21/2016 12:13    Hip Imaging: Hip-R DG 2-3 views:  Results for orders placed during the hospital encounter of 02/23/20  DG HIP UNILAT W OR W/O PELVIS 2-3 VIEWS RIGHT   Narrative CLINICAL DATA:  Chronic bilateral hip pain. No reported injury. History of prostate cancer treated with brachytherapy.  EXAM: DG HIP (WITH OR WITHOUT PELVIS) 2-3V RIGHT  COMPARISON:  02/07/2020 CT abdomen/pelvis.  FINDINGS: Brachytherapy seeds overlie symphysis pubis in the expected location of the prostate. Partially visualized aorto bi-iliac stent graft. No aggressive appearing focal osseous lesions. No right hip fracture or dislocation. No significant right hip arthropathy. Degenerative changes in the visualized lower lumbar spine.  IMPRESSION: No acute osseous abnormality. No significant right hip arthropathy. Degenerative changes in the visualized lower lumbar spine.   Electronically  Signed   By: Ilona Sorrel M.D.   On: 02/24/2020 07:37    Hip-L DG 2-3 views:  Results for orders placed during the hospital encounter of 02/23/20  DG HIP UNILAT W OR W/O PELVIS 2-3 VIEWS LEFT   Narrative CLINICAL DATA:  Chronic bilateral hip pain. No reported injury.  EXAM: DG HIP (WITH OR WITHOUT PELVIS) 2-3V LEFT  COMPARISON:  02/07/2020 CT abdomen/pelvis.  FINDINGS: Brachytherapy seeds overlie the symphysis pubis in the expected location of the prostate. No left hip fracture or dislocation. No significant left hip arthropathy. No aggressive appearing focal osseous lesions. Partially visualized left common iliac stent graft.  IMPRESSION: No acute osseous abnormality.  No significant left hip arthropathy.   Electronically Signed   By: Ilona Sorrel M.D.   On: 02/24/2020 07:38    Complexity Note: Imaging results reviewed. Results shared with Mr. Mccorkle, using Layman's terms.                        Assessment  The primary encounter diagnosis was Chronic low back pain (1ry area of Pain) (Bilateral) (R>L). Diagnoses of Lumbar facet syndrome (Bilateral), Chronic abdominal pain (2ry area of Pain) (LLQ) (Left), Failed back surgical syndrome, Osteopenia of lumbar spine, Chronic pain syndrome, and Vitamin D insufficiency were also pertinent to this visit.  Plan of Care  I have discontinued Satira Anis. Metzger's polyethylene glycol powder. I am also having him maintain his polyethylene glycol, multivitamin with minerals, Glucosamine-Chondroitin (COSAMIN DS PO), valsartan-hydrochlorothiazide, amLODipine, rosuvastatin, oxyCODONE-acetaminophen, Magnesium, Melatonin, and tamsulosin. Pharmacotherapy (Medications Ordered): No orders of the defined types were placed in this encounter.  Procedure Orders    No procedure(s) ordered today   Lab Orders  No laboratory test(s) ordered today   Imaging Orders  No imaging studies ordered today   Referral Orders  No referral(s) requested  today   Orders:  No orders of the defined types were placed in this encounter.  Pharmacological management options:  Opioid Analgesics: We'll take over management today. See above orders Membrane stabilizer: Options discussed, including a trial. Muscle relaxant: We have discussed the possibility of a trial NSAID: Trial discussed. Other  analgesic(s): To be determined at a later time    Interventional management options: Planned, scheduled, and/or pending:       Considering:   Diagnostic celiac plexus block  Diagnostic bilateral lumbar facet block  Possible bilateral lumbar facet RFA  Diagnostic bilateral intra-articular hip joint injection  Diagnostic left upper lumbar ESI  Diagnostic left lower thoracic ESI    PRN Procedures:   None at this time    Total duration of non-face-to-face encounter: 12 minutes.  Follow-up plan:   Return in about 2 weeks (around 03/15/2020) for (F2F), (2V).    Recent Visits Date Type Provider Dept  02/20/20 Office Visit Milinda Pointer, MD [facility]  Showing recent visits within past 90 days and meeting all other requirements   Today's Visits Date Type Provider Dept  03/01/20 Telemedicine Milinda Pointer, MD [facility]  Showing today's visits and meeting all other requirements   Future Appointments Date Type Provider Dept  03/15/20 Appointment Milinda Pointer, MD [facility]  Showing future appointments within next 90 days and meeting all other requirements   Primary Care Physician: [name], MD Location: Telephone Virtual Visit Note by: Gaspar Cola, MD Date: 03/01/2020; Time: 12:41 PM  Note: This dictation was prepared with Dragon dictation. Any transcriptional errors that may result from this process are unintentional.  Disclaimer:  * Given the special circumstances of the COVID-19 pandemic, the federal government has announced that the Office for Civil Rights (OCR) will exercise  its enforcement discretion and will not impose penalties on physicians using telehealth in the event of noncompliance with regulatory requirements under the [place] and [place] (HIPAA) in connection with the good faith provision of telehealth during the DIL[place]-32 national public health emergency. ([place])

## 2020-03-01 ENCOUNTER — Ambulatory Visit: Payer: Medicare Other | Attending: Pain Medicine | Admitting: Pain Medicine

## 2020-03-01 ENCOUNTER — Other Ambulatory Visit: Payer: Self-pay

## 2020-03-01 DIAGNOSIS — E559 Vitamin D deficiency, unspecified: Secondary | ICD-10-CM | POA: Insufficient documentation

## 2020-03-01 DIAGNOSIS — M961 Postlaminectomy syndrome, not elsewhere classified: Secondary | ICD-10-CM | POA: Diagnosis not present

## 2020-03-01 DIAGNOSIS — R1032 Left lower quadrant pain: Secondary | ICD-10-CM | POA: Diagnosis not present

## 2020-03-01 DIAGNOSIS — M545 Low back pain, unspecified: Secondary | ICD-10-CM

## 2020-03-01 DIAGNOSIS — M8588 Other specified disorders of bone density and structure, other site: Secondary | ICD-10-CM

## 2020-03-01 DIAGNOSIS — M47816 Spondylosis without myelopathy or radiculopathy, lumbar region: Secondary | ICD-10-CM | POA: Diagnosis not present

## 2020-03-01 DIAGNOSIS — G894 Chronic pain syndrome: Secondary | ICD-10-CM

## 2020-03-01 DIAGNOSIS — G8929 Other chronic pain: Secondary | ICD-10-CM

## 2020-03-02 NOTE — Progress Notes (Deleted)
[place]  [phone] [phone]  ID: [name] OB: 05-Feb-1926  MR#: [id number]  JQB#:[id number]  Patient Care Team: [name], MD as PCP - General (Family Medicine) Jettie Booze, MD as PCP - Cardiology (Cardiology) Lollie Sails, MD (Inactive) as Consulting Physician (Gastroenterology) Lloyd Huger, MD as Consulting Physician (Oncology)  CHIEF COMPLAINT: MDS, thrombocytopenia  INTERVAL HISTORY: Patient returns to clinic today as an add-on for repeat laboratory work and consideration of Nplate.  He has an enlarging aortic aneurysm requiring repair and his procedure is scheduled for next week.  He currently feels well and is at his baseline. He has no neurologic complaints. He has a good appetite and denies weight loss.  He denies any chest pain, shortness of breath, cough, or hemoptysis.  He has no nausea, vomiting, constipation, or diarrhea.  He has no urinary complaints.  Patient offers no specific complaints today.  REVIEW OF SYSTEMS:   Review of Systems  Constitutional: Negative.  Negative for fever, malaise/fatigue and weight loss.  Respiratory: Negative.  Negative for cough and shortness of breath.   Cardiovascular: Negative.  Negative for chest pain and leg swelling.  Gastrointestinal: Negative for abdominal pain, blood in stool, melena, nausea and vomiting.  Genitourinary: Negative.  Negative for hematuria.  Musculoskeletal: Negative.  Negative for back pain.  Skin: Negative.  Negative for rash.  Neurological: Negative.  Negative for dizziness, sensory change, focal weakness, weakness and headaches.  Endo/Heme/Allergies: Bruises/bleeds easily.  Psychiatric/Behavioral: Negative.  The patient is not nervous/anxious and does not have insomnia.     As per HPI. Otherwise, a complete review of systems is negative.  PAST MEDICAL HISTORY: Past Medical History:  Diagnosis Date  . AAA (abdominal aortic  aneurysm) ([place]) 1999   a. treated with open repair in 1999 with subsequent distal anastomotic pseudoaneurysm that is being followed by Dr. Oneida Alar.  . Adenomatous colon polyp   . Aorta aneurysm ([place])   . Arthritis   . Basal cell cancer Dec. 24, 2013   Nose  . CAD (coronary artery disease)    a. s/p CABG 03/2018.  [facility]) Aug. 2013   prostate Seeding  implant  . Carotid artery occlusion   . Enlarged prostate 2008   Thermotherapy  . Gastroesophageal reflux disease   . GI bleed   . Hyperlipidemia   . Hypertension   . Mild aortic stenosis   . Myelodysplasia (myelodysplastic syndrome) ([place])   . Pancytopenia ([place])    sees Dr. Grayland Ormond  . Peripheral vascular disease ([place])   . Postoperative atrial fibrillation ([place])   . Psoriasis   . S/P CABG (coronary artery bypass graft)     PAST SURGICAL HISTORY: Past Surgical History:  Procedure Laterality Date  . ABDOMINAL AORTIC ANEURYSM REPAIR  1999  . ABDOMINAL AORTIC ENDOVASCULAR STENT GRAFT N/A 09/12/2019   Procedure: ABDOMINAL AORTIC ENDOVASCULAR STENT GRAFT;  Surgeon: Elam Dutch, MD;  Location: [facility];  Service: Vascular;  Laterality: N/A;  . CARDIAC CATHETERIZATION  10/2009  . CHOLECYSTECTOMY    . CORONARY ARTERY BYPASS GRAFT N/A 04/08/2018   Procedure: CORONARY ARTERY BYPASS GRAFTING (CABG) x four, using left mammary artery and right leg greater saphenous vein harvested endoscopically.;  Surgeon: Gaye Pollack, MD;  Location: [facility] OR;  Service: Open Heart Surgery;  Laterality: N/A;  . ESOPHAGOGASTRODUODENOSCOPY (EGD) WITH PROPOFOL N/A 06/26/2016   Procedure: ESOPHAGOGASTRODUODENOSCOPY (EGD) WITH PROPOFOL;  Surgeon: Lollie Sails, MD;  Location: [facility] ENDOSCOPY;  Service: Endoscopy;  Laterality: N/A;  . EYE SURGERY  1990   cataract surgery bilateral  . LEFT HEART CATH AND CORONARY ANGIOGRAPHY N/A 03/15/2018   Procedure: LEFT HEART CATH AND CORONARY ANGIOGRAPHY;  Surgeon: Jettie Booze, MD;  Location: [facility] CV LAB;   Service: Cardiovascular;  Laterality: N/A;  . PROSTATE SURGERY    . SPINE SURGERY  2009   ruptured disc  . TEE WITHOUT CARDIOVERSION N/A 04/08/2018   Procedure: TRANSESOPHAGEAL ECHOCARDIOGRAM (TEE);  Surgeon: Gaye Pollack, MD;  Location: [facility];  Service: Open Heart Surgery;  Laterality: N/A;  . TONSILLECTOMY     removed as a teenager  . Visceral angiogram  05/14/2012   Right  groin area  . VISCERAL ANGIOGRAM N/A 05/14/2012   Procedure: VISCERAL ANGIOGRAM;  Surgeon: Elam Dutch, MD;  Location: [facility] CATH LAB;  Service: Cardiovascular;  Laterality: N/A;    FAMILY HISTORY Family History  Problem Relation Age of Onset  . Heart attack Father   . Heart disease Father        After age 63  . COPD Father   . Cancer Sister        Breast and Brain  . Heart disease Brother        before age 50  . Heart attack Brother        ADVANCED DIRECTIVES:    HEALTH MAINTENANCE: Social History   Tobacco Use  . Smoking status: Former Smoker    Types: Cigarettes    Quit date: 04/29/1997    Years since quitting: 22.8  . Smokeless tobacco: Never Used  Substance Use Topics  . Alcohol use: No  . Drug use: No     Allergies  Allergen Reactions  . Ciprofloxacin Other (See Comments)    Abdominal Pain     Current Outpatient Medications  Medication Sig Dispense Refill  . amLODipine (NORVASC) 10 MG tablet Take 0.5 tablets (5 mg total) by mouth daily. 60 tablet 1  . Glucosamine-Chondroitin (COSAMIN DS PO) Take 2 tablets by mouth daily at 12 noon.    . Magnesium 100 MG TABS Take 200 mg by mouth daily with supper.    . Melatonin 3 MG TABS Take 5 mg by mouth at bedtime.     . Multiple Vitamin (MULTIVITAMIN WITH MINERALS) TABS tablet Take 1 tablet by mouth daily at 12 noon. Centrum Silver    . oxyCODONE-acetaminophen (PERCOCET/ROXICET) 5-325 MG tablet Take 1 tablet by mouth every 8 (eight) hours as needed for moderate pain or severe pain.    . polyethylene glycol (MIRALAX / GLYCOLAX) packet Take  17 g by mouth daily. (Patient taking differently: Take 17 g by mouth daily as needed for mild constipation. ) 14 each 0  . rosuvastatin (CRESTOR) 20 MG tablet Take 20 mg by mouth at bedtime.     . tamsulosin (FLOMAX) 0.4 MG CAPS capsule TAKE 1 CAPSULE BY MOUTH EVERY DAY    . valsartan-hydrochlorothiazide (DIOVAN HCT) 160-12.5 MG tablet Take 1 tablet by mouth daily. 30 tablet 1   No current facility-administered medications for this visit.    OBJECTIVE: There were no vitals filed for this visit.   There is no height or weight on file to calculate BMI.    ECOG FS:0 - Asymptomatic  General: Well-developed, well-nourished, no acute distress. Eyes: Pink conjunctiva, anicteric sclera. HEENT: Normocephalic, moist mucous membranes. Lungs: Clear to auscultation bilaterally. Heart: Regular rate and rhythm. No rubs, murmurs, or gallops. Abdomen: Soft, nontender, nondistended. No organomegaly noted, normoactive bowel sounds.  Musculoskeletal: No edema, cyanosis, or clubbing. Neuro: Alert, answering all questions appropriately. Cranial nerves grossly intact. Skin: No rashes or petechiae noted. Psych: Normal affect.  LAB RESULTS:  Lab Results  Component Value Date   NA 138 02/23/2020   K 3.8 02/23/2020   CL 102 02/23/2020   CO2 26 02/23/2020   GLUCOSE 99 02/23/2020   BUN 26 (H) 02/23/2020   CREATININE 1.00 02/23/2020   CALCIUM 9.5 02/23/2020   PROT 7.1 02/23/2020   ALBUMIN 4.8 02/23/2020   AST 31 02/23/2020   ALT 23 02/23/2020   ALKPHOS 48 02/23/2020   BILITOT 1.2 02/23/2020   GFRNONAA >60 02/23/2020   GFRAA >60 02/23/2020    Lab Results  Component Value Date   WBC 8.0 09/13/2019   NEUTROABS 1.7 09/06/2019   HGB 10.0 (L) 09/13/2019   HCT 29.1 (L) 09/13/2019   MCV 87.9 09/13/2019   PLT 36 (L) 02/23/2020     STUDIES: DG Thoracic Spine 2 View  Result Date: 02/24/2020 CLINICAL DATA:  Back pain. EXAM: THORACIC SPINE 2 VIEWS COMPARISON:  CT 02/07/2020.  Chest x-ray 09/12/2019,  05/05/2018. FINDINGS: Prior CABG. Surgical clips right upper quadrant. Visceral vascular calcification. Diffuse osteopenia. Mild multilevel degenerative change. No acute bony abnormality identified. IMPRESSION: Diffuse osteopenia. Mild multilevel degenerative change. No acute abnormality identified. Electronically Signed   By: Marcello Moores  Register   On: 02/24/2020 07:39   DG Lumbar Spine Complete W/Bend  Result Date: 02/24/2020 CLINICAL DATA:  Low back pain. EXAM: LUMBAR SPINE - COMPLETE WITH BENDING VIEWS COMPARISON:  CT 02/07/2020.  Lumbar spine 10/06/2008. FINDINGS: Lumbar spine numbered the lowest segmented appearing lumbar shaped vertebrae on lateral view as L5. Surgical clips right upper quadrant. Visceral vascular calcification again noted. Aortoiliac stent graft noted. Diffuse osteopenia. Lumbar spine scoliosis. Diffuse severe multilevel disc degeneration. Complete or near complete loss of disc space noted at L1-L2, L2-L3, L3-L4, and L4-L5. Endplate osteophyte formation and endplate sclerosis noted at all levels. Diffuse facet hypertrophy present particularly prominent at L4-L5 and L5-S1. 3 mm anterolisthesis L5 on S1. No flexion or extension deformity noted. No acute bony abnormality identified. No evidence of fracture. IMPRESSION: 1. Diffuse osteopenia. Lumbar spine scoliosis. Diffuse severe multilevel degenerative change lumbar spine as above. 3 mm anterolisthesis L5 on S1. No flexion or extension deformity noted. 2. Aortoiliac stent grafts. Visceral atherosclerotic vascular calcification noted. Electronically Signed   By: Marcello Moores  Register   On: 02/24/2020 07:31   CT ABDOMEN PELVIS W CONTRAST  Result Date: 02/07/2020 CLINICAL DATA:  Left lower quadrant pain for 2 months. Previous abdominal aortic aneurysm repair. Personal history of prostate carcinoma. EXAM: CT ABDOMEN AND PELVIS WITH CONTRAST TECHNIQUE: Multidetector CT imaging of the abdomen and pelvis was performed using the standard protocol  following bolus administration of intravenous contrast. CONTRAST:  122m OMNIPAQUE IOHEXOL 300 MG/ML  SOLN COMPARISON:  10/05/2019 FINDINGS: Lower Chest: No acute findings. Hepatobiliary: A few tiny sub-cm low attention lesions are too small to characterize, but most likely represent tiny cysts. No suspicious liver lesions identified. Prior cholecystectomy. No evidence of biliary obstruction. Pancreas:  No mass or inflammatory changes. Spleen: Within normal limits in size and appearance. Adrenals/Urinary Tract: No masses identified. Stable left renal scarring and cysts. No evidence of ureteral calculi or hydronephrosis. Stomach/Bowel: No evidence of obstruction, inflammatory process or abnormal fluid collections. Diverticulosis is seen mainly involving the sigmoid colon, however there is no evidence of diverticulitis. Vascular/Lymphatic: No pathologically enlarged lymph nodes. Stable appearance of previous abdominal aortic aneurysm stent graft  repair, and decreased size of the associated treated pseudoaneurysm. Reproductive: Brachytherapy seeds again seen throughout the prostate gland. Other:  None. Musculoskeletal:  No suspicious bone lesions identified. IMPRESSION: 1. Sigmoid diverticulosis. No radiographic evidence of diverticulitis or other acute findings. 2. No evidence of metastatic disease. Electronically Signed   By: Marlaine Hind M.D.   On: 02/07/2020 20:53   DG HIP UNILAT W OR W/O PELVIS 2-3 VIEWS LEFT  Result Date: 02/24/2020 CLINICAL DATA:  Chronic bilateral hip pain. No reported injury. EXAM: DG HIP (WITH OR WITHOUT PELVIS) 2-3V LEFT COMPARISON:  02/07/2020 CT abdomen/pelvis. FINDINGS: Brachytherapy seeds overlie the symphysis pubis in the expected location of the prostate. No left hip fracture or dislocation. No significant left hip arthropathy. No aggressive appearing focal osseous lesions. Partially visualized left common iliac stent graft. IMPRESSION: No acute osseous abnormality.  No significant  left hip arthropathy. Electronically Signed   By: Ilona Sorrel M.D.   On: 02/24/2020 07:38   DG HIP UNILAT W OR W/O PELVIS 2-3 VIEWS RIGHT  Result Date: 02/24/2020 CLINICAL DATA:  Chronic bilateral hip pain. No reported injury. History of prostate cancer treated with brachytherapy. EXAM: DG HIP (WITH OR WITHOUT PELVIS) 2-3V RIGHT COMPARISON:  02/07/2020 CT abdomen/pelvis. FINDINGS: Brachytherapy seeds overlie symphysis pubis in the expected location of the prostate. Partially visualized aorto bi-iliac stent graft. No aggressive appearing focal osseous lesions. No right hip fracture or dislocation. No significant right hip arthropathy. Degenerative changes in the visualized lower lumbar spine. IMPRESSION: No acute osseous abnormality. No significant right hip arthropathy. Degenerative changes in the visualized lower lumbar spine. Electronically Signed   By: Ilona Sorrel M.D.   On: 02/24/2020 07:37    ASSESSMENT: MDS, thrombocytopenia.  PLAN:    1. MDS: Confirmed by bone marrow biopsy. 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  Given his absolute increase in monocytes the possibility of an early/evolving CMML cannot be excluded. Cytogenics are normal 46XY. No increase in blast count.  Return to clinic as previously scheduled. 2. 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  Patient's platelet count has trended down slightly to 32. His platelet count has ranged between 37 and 60 since April 2019.  Upon further review of the chart patient's platelet count has not been above 100 since February 2013.  Proceed with 2 mg/kg Nplate today.  Unclear if this will improve his platelet count in time for his aneurysm repair.  Recommend checking platelet count on day of procedure and if he has not responded appropriately, he will need platelet transfusion prior to and during his aneurysm repair.  Return to clinic as previously scheduled as above.   3.  Prostate  cancer: Patient is status post seed implantation.  His last PSA on March 19, 2015 was reported as 0.03.  4.  Cardiac disease/atrial fibrillation: Continue follow-up and treatment per cardiac surgery. 5.  History of GI bleed: Continue monitoring by GI.  Patient has discontinued Aciphex. 6.  Leukopenia: Resolved. 7.  Anemia: Mild, monitor.   Patient expressed understanding and was in agreement with this plan. He also understands that He can call clinic at any time with any questions, concerns, or complaints.   Lloyd Huger, MD   03/02/2020 12:12 PM

## 2020-03-09 ENCOUNTER — Inpatient Hospital Stay: Payer: Medicare Other

## 2020-03-09 ENCOUNTER — Inpatient Hospital Stay: Payer: Medicare Other | Admitting: Oncology

## 2020-03-15 ENCOUNTER — Encounter: Payer: Self-pay | Admitting: Pain Medicine

## 2020-03-15 ENCOUNTER — Other Ambulatory Visit: Payer: Self-pay

## 2020-03-15 ENCOUNTER — Ambulatory Visit: Payer: Medicare Other | Attending: Pain Medicine | Admitting: Pain Medicine

## 2020-03-15 VITALS — BP 153/85 | HR 62 | Temp 97.3°F | Resp 16 | Ht 69.0 in | Wt 152.0 lb

## 2020-03-15 DIAGNOSIS — M545 Low back pain: Secondary | ICD-10-CM | POA: Insufficient documentation

## 2020-03-15 DIAGNOSIS — M47817 Spondylosis without myelopathy or radiculopathy, lumbosacral region: Secondary | ICD-10-CM | POA: Insufficient documentation

## 2020-03-15 DIAGNOSIS — R233 Spontaneous ecchymoses: Secondary | ICD-10-CM | POA: Insufficient documentation

## 2020-03-15 DIAGNOSIS — M47816 Spondylosis without myelopathy or radiculopathy, lumbar region: Secondary | ICD-10-CM | POA: Diagnosis present

## 2020-03-15 DIAGNOSIS — G8929 Other chronic pain: Secondary | ICD-10-CM

## 2020-03-15 DIAGNOSIS — M8588 Other specified disorders of bone density and structure, other site: Secondary | ICD-10-CM | POA: Diagnosis present

## 2020-03-15 DIAGNOSIS — M4310 Spondylolisthesis, site unspecified: Secondary | ICD-10-CM | POA: Diagnosis not present

## 2020-03-15 DIAGNOSIS — M5137 Other intervertebral disc degeneration, lumbosacral region: Secondary | ICD-10-CM | POA: Insufficient documentation

## 2020-03-15 DIAGNOSIS — D693 Immune thrombocytopenic purpura: Secondary | ICD-10-CM | POA: Insufficient documentation

## 2020-03-15 DIAGNOSIS — G894 Chronic pain syndrome: Secondary | ICD-10-CM | POA: Insufficient documentation

## 2020-03-15 DIAGNOSIS — E559 Vitamin D deficiency, unspecified: Secondary | ICD-10-CM | POA: Insufficient documentation

## 2020-03-15 DIAGNOSIS — M8580 Other specified disorders of bone density and structure, unspecified site: Secondary | ICD-10-CM | POA: Diagnosis present

## 2020-03-15 MED ORDER — VITAMIN D3 125 MCG (5000 UT) PO CAPS: 1.0000 | ORAL_CAPSULE | Freq: Every day | ORAL | 5 refills | Status: AC

## 2020-03-15 MED ORDER — MAGNESIUM 500 MG PO CAPS: 500.0000 mg | ORAL_CAPSULE | Freq: Every day | ORAL | 0 refills | Status: AC

## 2020-03-15 MED ORDER — ERGOCALCIFEROL 1.25 MG (50000 UT) PO CAPS: 50000.0000 [IU] | ORAL_CAPSULE | ORAL | 0 refills | Status: AC

## 2020-03-15 MED ORDER — CALCIUM CARBONATE 600 MG PO TABS: 1200.0000 mg | ORAL_TABLET | Freq: Every day | ORAL | 5 refills | Status: DC

## 2020-03-15 NOTE — Progress Notes (Addendum)
PROVIDER NOTE: Information contained herein reflects review and annotations entered in association with encounter. Interpretation of such information and data should be left to medically-trained personnel. Information provided to patient can be located elsewhere in the medical record under "Patient Instructions". Document created using STT-dictation technology, any transcriptional errors that may result from process are unintentional.    Patient: [name]  Service Category: E/M  Provider: Gaspar Cola, MD  DOB: 07-Mar-1926  DOS: 03/15/2020  Referring Provider: [name], MD  MRN: [id number]  Setting: Ambulatory outpatient  PCP: [name], MD  Type: Established Patient  Specialty: Interventional Pain Management    Location: Office  Delivery: Face-to-face     Primary Reason(s) for Visit: Encounter for evaluation before starting new chronic pain management plan of care (Level of risk: moderate) CC: Back Pain (lumbar right radiating to the left ), Abdominal Pain (LUQ only when lifting ), and Leg Pain (bilateral, rash appearance, is also on chest, causes burning)  HPI  [name] is an 86 y.o. year old, male patient, who comes today for a follow-up evaluation to review the test results and decide on a treatment plan. 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[place]); On long term drug therapy; Chronic low back pain (1ry area of Pain) (Bilateral) (R>L); DDD (degenerative disc disease),  lumbosacral; Drug-induced constipation; Generalized abdominal pain; Malignant neoplasm of prostate ([place]); Other long term (current) drug therapy; Pain in joint of right shoulder; Postlaminectomy syndrome, lumbar region; Purpura ([place]); Sensorineural hearing loss (SNHL) of both ears; Chronic pain syndrome; Pharmacologic therapy; Disorder of skeletal system; Problems influencing health status; Chronic abdominal pain (2ry area of Pain) (LLQ) (Left); Thoracic radiculitis (T11) (Left); DDD (degenerative disc disease), thoracic; Chronic hip pain (Bilateral); Lumbar facet syndrome (Bilateral); Failed back surgical syndrome; Diverticulosis of sigmoid colon; Vitamin D insufficiency; Osteopenia of lumbar spine; Osteopenia determined by x-ray; Lumbosacral facet hypertrophy (Multilevel) (Bilateral); Lumbosacral Grade 1 Anterolisthesis of L5 over S1 (3 mm); Petechial rash; and Idiopathic thrombocytopenic purpura (HCC) on their problem list. His primarily concern today is the Back Pain (lumbar right radiating to the left ), Abdominal Pain (LUQ only when lifting ), and Leg Pain (bilateral, rash appearance, is also on chest, causes burning)  Pain Assessment: Location: Lateral, Right Back(see visit info for additional pain sites) Radiating: back radiating into left side. Onset: More than a month ago Duration: Chronic pain Quality: Discomfort, Constant Severity: 6 /10 (subjective, self-reported pain score)  Note: Reported level is compatible with observation.                         When using our objective Pain Scale, levels between 6 and 10/10 are said to belong in an emergency room, as it progressively worsens from a 6/10, described as severely limiting, requiring emergency care not usually available at an outpatient pain management facility. At a 6/10 level, communication becomes difficult and requires great effort. Assistance to reach the emergency department may be required. Facial flushing and profuse sweating along  with potentially dangerous increases in heart rate and blood pressure will be evident. Effect on ADL: sleep  disruption Timing: Constant Modifying factors: 2 - 3 oxycodone daily and reports that this helps very little. BP: (!) 153/85  HR: 62  [name] comes in today for a follow-up visit after his initial evaluation on 02/20/2020. Today we went over the results of his tests. These were explained in "Layman's terms". During today's appointment we went over my diagnostic impression, as well as the proposed treatment plan.  According to the patient his primary area of pain is that of the lower back, bilaterally (Bilateral), with the right side being worse than the left (R>L). He does admit to having had lumbar spine surgery approximately 11 years ago by an orthopedic surgeon from [place]" at the [facility]. Prior to that he had a series of 3 lumbar injections done by Dr. Nelva Bush with no significant benefit. The patient also refers having had physical therapy many years ago for the low back pain, but it did not help. Currently he takes Percocet 5/325 1 tablet p.o. twice daily to 3 times daily to keep this pain control.  The patient's second worst pain and the reason why he was consulted to our service is a chronic left lower quadrant (LLQ) abdominal pain that seems to be worse when he sleeps on the left side. He denies the pain going into the testicular area or groin area. He also denies any leg pain. He does indicate pain to go towards his navel, just below and what would be a T11 dermatomal distribution. He states being very active and exercising on a daily basis even though he is 86 years old. 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 Approximately several months ago he went to reach for that he be and started experiencing this sharp pain in the left lower quadrant. Subsequently he underwent Aortic surgery with no problems, but he has continued to have pain  especially when he uses the stationary bike. He has had multiple studies done including abdominal CTs and abdominal CT angiograms. He refers that he is still not clear as to what this pain is. He describes his worst pain is being that of the lower back except when he is on that stationary bike or he attempts to sleep on the left side at which time the pain can go from a 2/10 to an 8/10.  The patient performed several provocative maneuvers. Flexion of the lumbar spine increased his low back pain. Extension increased the low back pain, bilaterally, much more than flexion. Hyperextension and rotation towards the left side increases the pain the most in the lower back and buttocks area. Hyperextension and rotation towards the right side also increase some of the pain in the lower back. Bringing his knee to the chest on the right side did cause some right lower back pain but no abdominal pain. Doing the same maneuver for the left side interestingly increase the pain in the right lower back, but not the abdominal area. Straight leg raise on the right side did not trigger any radicular symptoms except for some discomfort over the thigh area. Indicates of the same maneuver towards the left side the results will likely the same. Patrick maneuver on the right side did trigger right hip pain, but no low back pain. Patrick maneuver on the left side also trigger ipsilateral hip pain, but no low back pain. Neither one reproduce the abdominal pain.  According to the patient he has had a full gastroenterology and vascular surgery evaluation and a do not seem to think that  this pain is vascular or associated with a gastrointestinal tract except for the possibility of the sigmoid diverticulosis found. Another theoretical possibility is chronic ischemic bowel pain since he recently had a hard to surgery done.  Today's evaluation reveals that the abdominal pain has been going down it seems to have subsided.  At  this point, his main concern is that of the lower back pain.  Examination of the lower back confirms that this seems to be coming from the facet joints.  Today he again had a positive response to the hyperextension and rotation provocative maneuver for bilateral lumbar facet pain.  Today he again has confirmed that he is not experiencing any type of lower extremity pain.  Controlled Substance Pharmacotherapy Assessment REMS (Risk Evaluation and Mitigation Strategy)  Analgesic: Oxycodone-Acetaminophen 5-325  Highest recorded MME/day: 32.14 mg/day MME/day: 22.50 mg/day  Pill Count: None expected due to no prior prescriptions written by our practice. No notes on file Pharmacokinetics: Liberation and absorption (onset of action): WNL Distribution (time to peak effect): WNL Metabolism and excretion (duration of action): WNL         Pharmacodynamics: Desired effects: Analgesia: Mr. Kinslow reports >50% benefit. Functional ability: Patient reports that medication allows him to accomplish basic ADLs Clinically meaningful improvement in function (CMIF): Sustained CMIF goals met Perceived effectiveness: Described as relatively effective, allowing for increase in activities of daily living (ADL) Undesirable effects: Side-effects or Adverse reactions: None reported Monitoring: [place] PMP: PDMP not reviewed this encounter. Online review of the past 25-monthperiod previously conducted. Not applicable at this point since we have not taken over the patient's medication management yet. List of other Serum/Urine Drug Screening Test(s):  Lab Results  Component Value Date   COCAINSCRNUR NONE DETECTED 02/23/2020   THCU NONE DETECTED 02/23/2020   List of all UDS test(s) done:  No results found for: TOXASSSELUR, SUMMARY Last UDS on record: No results found for: TOXASSSELUR, SUMMARY UDS interpretation: No unexpected findings.          Medication Assessment Form: Patient introduced to form today Treatment  compliance: Treatment may start today if patient agrees with proposed plan. Evaluation of compliance is not applicable at this point Risk Assessment Profile: Aberrant behavior: See initial evaluations. None observed or detected today Comorbid factors increasing risk of overdose: See initial evaluation. No additional risks detected today Opioid risk tool (ORT):  Opioid Risk  02/16/2020  Alcohol 0  Illegal Drugs 0  Rx Drugs 0  Alcohol 0  Illegal Drugs 0  Rx Drugs 0  Age between 16-45 years  0  History of Preadolescent Sexual Abuse 0  Psychological Disease 0  Depression 0  Opioid Risk Tool Scoring 0  Opioid Risk Interpretation Low Risk    ORT Scoring interpretation table:  Score <3 = Low Risk for SUD  Score between 4-7 = Moderate Risk for SUD  Score >8 = High Risk for Opioid Abuse   Risk of substance use disorder (SUD): Low  Risk Mitigation Strategies:  Patient opioid safety counseling: Completed today. Counseling provided to patient as per "Patient Counseling Document". Document signed by patient, attesting to counseling and understanding Patient-Prescriber Agreement (PPA): Obtained today.  Controlled substance notification to other providers: Written and sent today.  Pharmacologic Plan: Today we may be taking over the patient's pharmacological regimen. See below.             Laboratory Chemistry Profile   Renal Lab Results  Component Value Date   BUN 26 (H) 02/23/2020  CREATININE 1.00 02/23/2020   BCR 14 04/28/2018   GFRAA >60 02/23/2020   GFRNONAA >60 02/23/2020   PROTEINUR NEGATIVE 09/09/2019    Electrolytes Lab Results  Component Value Date   NA 138 02/23/2020   K 3.8 02/23/2020   CL 102 02/23/2020   CALCIUM 9.5 02/23/2020   MG 2.0 09/12/2019    Hepatic Lab Results  Component Value Date   AST 31 02/23/2020   ALT 23 02/23/2020   ALBUMIN 4.8 02/23/2020   ALKPHOS 48 02/23/2020   LIPASE 36 09/09/2019    ID Lab Results  Component Value Date   SARSCOV2NAA  NOT DETECTED 09/10/2019   STAPHAUREUS NEGATIVE 09/08/2019   MRSAPCR NEGATIVE 09/08/2019    Bone Lab Results  Component Value Date   VD25OH 26.72 (L) 02/23/2020    Endocrine Lab Results  Component Value Date   GLUCOSE 99 02/23/2020   GLUCOSEU NEGATIVE 09/09/2019   HGBA1C 5.7 (H) 08/16/2018    Neuropathy Lab Results  Component Value Date   VITAMINB12 740 02/23/2020   HGBA1C 5.7 (H) 08/16/2018    CNS No results found for: COLORCSF, APPEARCSF, RBCCOUNTCSF, WBCCSF, POLYSCSF, LYMPHSCSF, EOSCSF, PROTEINCSF, GLUCCSF, JCVIRUS, CSFOLI, IGGCSF, LABACHR, ACETBL, LABACHR, ACETBL  Inflammation (CRP: Acute  ESR: Chronic) Lab Results  Component Value Date   CRP <0.5 02/23/2020   ESRSEDRATE 7 02/23/2020    Rheumatology No results found for: RF, ANA, LABURIC, URICUR, LYMEIGGIGMAB, LYMEABIGMQN, HLAB27  Coagulation Lab Results  Component Value Date   INR 1.2 09/12/2019   LABPROT 15.4 (H) 09/12/2019   APTT 50 (H) 09/12/2019   PLT 36 (L) 02/23/2020   LABHEMA Note: 04/28/2018    Cardiovascular Lab Results  Component Value Date   TROPONINI <0.03 01/01/2019   HGB 10.0 (L) 09/13/2019   HCT 29.1 (L) 09/13/2019    Screening Lab Results  Component Value Date   SARSCOV2NAA NOT DETECTED 09/10/2019   COVIDSOURCE NASOPHARYNGEAL 09/10/2019   STAPHAUREUS NEGATIVE 09/08/2019   MRSAPCR NEGATIVE 09/08/2019    Cancer No results found for: CEA, CA125, LABCA2  Allergens No results found for: ALMOND, APPLE, ASPARAGUS, AVOCADO, BANANA, [place], BASIL, BAYLEAF, GREENBEAN, LIMABEAN, WHITEBEAN, BEEFIGE, REDBEET, BLUEBERRY, BROCCOLI, CABBAGE, MELON, CARROT, CASEIN, CASHEWNUT, CAULIFLOWER, CELERY     Note: Lab results reviewed.  Recent Diagnostic Imaging Review  Thoracic Imaging: Thoracic DG 2-3 views:  Results for orders placed during the hospital encounter of 02/23/20  DG Thoracic Spine 2 View   Narrative CLINICAL DATA:  Back pain.  EXAM: THORACIC SPINE 2 VIEWS  COMPARISON:  CT 02/07/2020.   Chest x-ray 09/12/2019, 05/05/2018.  FINDINGS: Prior CABG. Surgical clips right upper quadrant. Visceral vascular calcification. Diffuse osteopenia. Mild multilevel degenerative change. No acute bony abnormality identified.  IMPRESSION: Diffuse osteopenia. Mild multilevel degenerative change. No acute abnormality identified.   Electronically Signed   By: Marcello Moores  Register   On: 02/24/2020 07:39    Lumbosacral Imaging: Lumbar DG 2-3 views:  Results for orders placed during the hospital encounter of 10/11/08  DG Lumbar Spine 2-3 Views   Narrative Clinical Data: Preop spinal stenosis.   LUMBAR SPINE - 2-3 VIEW   Comparison: None   Findings: There are five non-rib bearing lumbar segments.  The lowest open interspace is L5-S1.   There is mild lumbar scoliosis convex to the left.  There is advanced disc degeneration at L2-3 and L3-4  and moderate disc degeneration at L1-2 and L4-5.  There is no fracture.  Alignment is normal.   IMPRESSION: Lumbar degenerative changes as above.  Provider: Debby Freiberg         Lumbar DG Bending views:  Results for orders placed during the hospital encounter of 02/23/20  DG Lumbar Spine Complete W/Bend   Narrative CLINICAL DATA:  Low back pain.  EXAM: LUMBAR SPINE - COMPLETE WITH BENDING VIEWS  COMPARISON:  CT 02/07/2020.  Lumbar spine 10/06/2008.  FINDINGS: Lumbar spine numbered the lowest segmented appearing lumbar shaped vertebrae on lateral view as L5. Surgical clips right upper quadrant. Visceral vascular calcification again noted. Aortoiliac stent graft noted. Diffuse osteopenia. Lumbar spine scoliosis. Diffuse severe multilevel disc degeneration. Complete or near complete loss of disc space noted at L1-L2, L2-L3, L3-L4, and L4-L5. Endplate osteophyte formation and endplate sclerosis noted at all levels. Diffuse facet hypertrophy present particularly prominent at L4-L5 and L5-S1. 3 mm anterolisthesis L5 on S1. No flexion  or extension deformity noted. No acute bony abnormality identified. No evidence of fracture.  IMPRESSION: 1. Diffuse osteopenia. Lumbar spine scoliosis. Diffuse severe multilevel degenerative change lumbar spine as above. 3 mm anterolisthesis L5 on S1. No flexion or extension deformity noted.  2. Aortoiliac stent grafts. Visceral atherosclerotic vascular calcification noted.   Electronically Signed   By: Marcello Moores  Register   On: 02/24/2020 07:31          Spine Imaging: CT-Guided Biopsy:  Results for orders placed during the hospital encounter of 08/21/16  CT Biopsy   Narrative INDICATION: Thrombocytopenia.  EXAM: CT BIOPSY  MEDICATIONS: None.  ANESTHESIA/SEDATION: Moderate (conscious) sedation was employed during this procedure. A total of Versed 1.5 mg and Fentanyl 50 mcg was administered intravenously.  Moderate Sedation Time: 23 minutes. The patient's level of consciousness and vital signs were monitored continuously by radiology nursing throughout the procedure under my direct supervision.  COMPLICATIONS: None immediate.  PROCEDURE: Informed written consent was obtained from the patient after a thorough discussion of the procedural risks, benefits and alternatives. All questions were addressed. Sterile Barrier Technique was utilized including mask, sterile gloves, sterile drape, hand hygiene and skin antiseptic. A timeout was performed prior to the initiation of the procedure.  The patient was placed prone on CT scanner. Right flank region was prepped and draped using sterile technique. Local anesthetic was applied. Under CT guidance, 22 gauge spinal needle was directed toward the posterior margin of right iliac bone. Lidocaine was injected subperiosteally. Needle was removed. Then, also under CT guidance, trocar was directed through the posterior cortex of right iliac bone. Heparinized and non heparinized bone marrow aspirations were obtained and given to  the pathology technologist who was present at that time. Then, bone marrow core biopsy was obtained and also given to the pathology technologist. Needle was removed and appropriate dressing was applied.  IMPRESSION: Under CT guidance, successful percutaneous bone marrow aspiration and core biopsy of right iliac bone.   Electronically Signed   By: Marijo Conception, M.D.   On: 08/21/2016 12:13    Hip Imaging: Hip-R DG 2-3 views:  Results for orders placed during the hospital encounter of 02/23/20  DG HIP UNILAT W OR W/O PELVIS 2-3 VIEWS RIGHT   Narrative CLINICAL DATA:  Chronic bilateral hip pain. No reported injury. History of prostate cancer treated with brachytherapy.  EXAM: DG HIP (WITH OR WITHOUT PELVIS) 2-3V RIGHT  COMPARISON:  02/07/2020 CT abdomen/pelvis.  FINDINGS: Brachytherapy seeds overlie symphysis pubis in the expected location of the prostate. Partially visualized aorto bi-iliac stent graft. No aggressive appearing focal osseous lesions. No right hip fracture or dislocation. No significant right hip  arthropathy. Degenerative changes in the visualized lower lumbar spine.  IMPRESSION: No acute osseous abnormality. No significant right hip arthropathy. Degenerative changes in the visualized lower lumbar spine.   Electronically Signed   By: Ilona Sorrel M.D.   On: 02/24/2020 07:37    Hip-L DG 2-3 views:  Results for orders placed during the hospital encounter of 02/23/20  DG HIP UNILAT W OR W/O PELVIS 2-3 VIEWS LEFT   Narrative CLINICAL DATA:  Chronic bilateral hip pain. No reported injury.  EXAM: DG HIP (WITH OR WITHOUT PELVIS) 2-3V LEFT  COMPARISON:  02/07/2020 CT abdomen/pelvis.  FINDINGS: Brachytherapy seeds overlie the symphysis pubis in the expected location of the prostate. No left hip fracture or dislocation. No significant left hip arthropathy. No aggressive appearing focal osseous lesions. Partially visualized left common iliac stent  graft.  IMPRESSION: No acute osseous abnormality.  No significant left hip arthropathy.   Electronically Signed   By: Ilona Sorrel M.D.   On: 02/24/2020 07:38    Complexity Note: Imaging results reviewed. Results shared with Mr. Anastasi, using Layman's terms.                        Meds   Current Outpatient Medications:  .  amLODipine (NORVASC) 10 MG tablet, Take 0.5 tablets (5 mg total) by mouth daily., Disp: 60 tablet, Rfl: 1 .  Glucosamine-Chondroitin (COSAMIN DS PO), Take 2 tablets by mouth daily at 12 noon., Disp: , Rfl:  .  Magnesium 100 MG TABS, Take 200 mg by mouth daily with supper., Disp: , Rfl:  .  Melatonin 3 MG TABS, Take 5 mg by mouth at bedtime. , Disp: , Rfl:  .  Multiple Vitamin (MULTIVITAMIN WITH MINERALS) TABS tablet, Take 1 tablet by mouth daily at 12 noon. Centrum Silver, Disp: , Rfl:  .  oxyCODONE-acetaminophen (PERCOCET/ROXICET) 5-325 MG tablet, Take 1 tablet by mouth every 8 (eight) hours as needed for moderate pain or severe pain., Disp: , Rfl:  .  polyethylene glycol (MIRALAX / GLYCOLAX) packet, Take 17 g by mouth daily. (Patient taking differently: Take 17 g by mouth daily as needed for mild constipation. ), Disp: 14 each, Rfl: 0 .  rosuvastatin (CRESTOR) 20 MG tablet, Take 20 mg by mouth at bedtime. , Disp: , Rfl:  .  valsartan-hydrochlorothiazide (DIOVAN HCT) 160-12.5 MG tablet, Take 1 tablet by mouth daily., Disp: 30 tablet, Rfl: 1 .  calcium carbonate (CALCIUM 600) 600 MG TABS tablet, Take 2 tablets (1,200 mg total) by mouth daily with breakfast., Disp: 60 tablet, Rfl: 5 .  Cholecalciferol (VITAMIN D3) 125 MCG (5000 UT) CAPS, Take 1 capsule (5,000 Units total) by mouth daily with breakfast. Take along with calcium and magnesium., Disp: 30 capsule, Rfl: 5 .  ergocalciferol (VITAMIN D2) 1.25 MG (50000 UT) capsule, Take 1 capsule (50,000 Units total) by mouth 2 (two) times a week. X 6 weeks., Disp: 12 capsule, Rfl: 0 .  Magnesium 500 MG CAPS, Take 1 capsule  (500 mg total) by mouth at bedtime., Disp: 180 capsule, Rfl: 0  ROS  Constitutional: Denies any fever or chills Gastrointestinal: No reported hemesis, hematochezia, vomiting, or acute GI distress Musculoskeletal: Denies any acute onset joint swelling, redness, loss of ROM, or weakness Neurological: No reported episodes of acute onset apraxia, aphasia, dysarthria, agnosia, amnesia, paralysis, loss of coordination, or loss of consciousness  Allergies  Mr. Serio is allergic to ciprofloxacin.  [place]  Drug: Mr. Mcneel  reports no history of drug  use. Alcohol:  reports no history of alcohol use. Tobacco:  reports that he quit smoking about 22 years ago. His smoking use included cigarettes. He has never used smokeless tobacco. Medical:  has a past medical history of AAA (abdominal aortic aneurysm) ([place]) (1999), Adenomatous colon polyp, Aorta aneurysm ([place]), Arthritis, Basal cell cancer (Dec. 24, 2013), CAD (coronary artery disease), Cancer ([place]) (Aug. 2013), Carotid artery occlusion, Enlarged prostate (2008), Gastroesophageal reflux disease, GI bleed, Hyperlipidemia, Hypertension, Mild aortic stenosis, Myelodysplasia (myelodysplastic syndrome) ([place]), Pancytopenia ([place]), Peripheral vascular disease ([place]), Postoperative atrial fibrillation ([place]), Psoriasis, and S/P CABG (coronary artery bypass graft). Surgical: Mr. Tuman  has a past surgical history that includes Cholecystectomy; Cardiac catheterization (10/2009); Spine surgery (2009); Visceral angiogram (05/14/2012); Abdominal aortic aneurysm repair (1999); visceral angiogram (N/A, 05/14/2012); Esophagogastroduodenoscopy (egd) with propofol (N/A, 06/26/2016); Prostate surgery; LEFT HEART CATH AND CORONARY ANGIOGRAPHY (N/A, 03/15/2018); Coronary artery bypass graft (N/A, 04/08/2018); TEE without cardioversion (N/A, 04/08/2018); Eye surgery (1990); Tonsillectomy; and Abdominal aortic endovascular stent graft (N/A, 09/12/2019). Family: family history includes  COPD in his father; Cancer in his sister; Heart attack in his brother and father; Heart disease in his brother and father.  Constitutional Exam  General appearance: Well nourished, well developed, and well hydrated. In no apparent acute distress Vitals:   03/15/20 1227  BP: (!) 153/85  Pulse: 62  Resp: 16  Temp: (!) 97.3 F (36.3 C)  TempSrc: Temporal  SpO2: 100%  Weight: 152 lb (68.9 kg)  Height: _0  (1.753 m)   BMI Assessment: Estimated body mass index is 22.45 kg/m as calculated from the following:   Height as of this encounter: _1  (1.753 m).   Weight as of this encounter: 152 lb (68.9 kg).  BMI interpretation table: BMI level Category Range association with higher incidence of chronic pain  <18 kg/m2 Underweight   18.5-24.9 kg/m2 Ideal body weight   25-29.9 kg/m2 Overweight Increased incidence by 20%  30-34.9 kg/m2 Obese (Class I) Increased incidence by 68%  35-39.9 kg/m2 Severe obesity (Class II) Increased incidence by 136%  >40 kg/m2 Extreme obesity (Class III) Increased incidence by 254%   Patient's current BMI Ideal Body weight  Body mass index is 22.45 kg/m. Ideal body weight: 70.7 kg (155 lb 13.8 oz)   BMI Readings from Last 4 Encounters:  03/15/20 22.45 kg/m  01/10/20 23.04 kg/m  11/10/19 22.59 kg/m  10/13/19 22.59 kg/m   Wt Readings from Last 4 Encounters:  03/15/20 152 lb (68.9 kg)  01/10/20 156 lb (70.8 kg)  11/10/19 153 lb (69.4 kg)  10/13/19 153 lb (69.4 kg)    Psych/Mental status: Alert, oriented x 3 (person, place, & time)       Eyes: PERLA Respiratory: No evidence of acute respiratory distress  Cervical Spine Exam  Skin & Axial Inspection: No masses, redness, edema, swelling, or associated skin lesions Alignment: Symmetrical Functional ROM: Unrestricted ROM      Stability: No instability detected Muscle Tone/Strength: Functionally intact. No obvious neuro-muscular anomalies detected. Sensory (Neurological): Unimpaired Palpation: No  palpable anomalies              Upper Extremity (UE) Exam    Side: Right upper extremity  Side: Left upper extremity  Skin & Extremity Inspection: Skin color, temperature, and hair growth are WNL. No peripheral edema or cyanosis. No masses, redness, swelling, asymmetry, or associated skin lesions. No contractures.  Skin & Extremity Inspection: Skin color, temperature, and hair growth are WNL. No peripheral edema or cyanosis. No masses, redness,  swelling, asymmetry, or associated skin lesions. No contractures.  Functional ROM: Unrestricted ROM          Functional ROM: Unrestricted ROM          Muscle Tone/Strength: Functionally intact. No obvious neuro-muscular anomalies detected.  Muscle Tone/Strength: Functionally intact. No obvious neuro-muscular anomalies detected.  Sensory (Neurological): Unimpaired          Sensory (Neurological): Unimpaired          Palpation: No palpable anomalies              Palpation: No palpable anomalies              Provocative Test(s):  Phalen's test: deferred Tinel's test: deferred Apley's scratch test (touch opposite shoulder):  Action 1 (Across chest): deferred Action 2 (Overhead): deferred Action 3 (LB reach): deferred   Provocative Test(s):  Phalen's test: deferred Tinel's test: deferred Apley's scratch test (touch opposite shoulder):  Action 1 (Across chest): deferred Action 2 (Overhead): deferred Action 3 (LB reach): deferred    Thoracic Spine Area Exam  Skin & Axial Inspection: 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 Alignment: Symmetrical Functional ROM: Unrestricted ROM Stability: No instability detected Muscle Tone/Strength: Functionally intact. No obvious neuro-muscular anomalies detected. 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 Alignment: Symmetrical Functional ROM: Decreased ROM       Stability: No instability detected Muscle Tone/Strength: Guarding observed Sensory (Neurological): Unimpaired Palpation: Complains of area being tender to palpation       Provocative Tests: Hyperextension/rotation test: (+) bilaterally for facet joint pain. Lumbar quadrant test (Kemp's test): (+) bilaterally for facet joint pain. Lateral bending test: deferred today       Patrick's Maneuver: (+) for bilateral hip arthralgia             FABER* test: (+) for bilateral hip arthralgia             S-I anterior distraction/compression test: deferred today         S-I lateral compression test: deferred today         S-I Thigh-thrust test: deferred today         S-I Gaenslen's test: deferred today         *(Flexion, ABduction and External Rotation)  Gait & Posture Assessment  Ambulation: Unassisted Gait: Relatively normal for age and body habitus Posture: WNL   Lower Extremity Exam    Side: Right lower extremity  Side: Left lower extremity  Stability: No instability observed          Stability: No instability observed          Skin & Extremity Inspection: Skin color, temperature, and hair growth are WNL. No peripheral edema or cyanosis. No masses, redness, swelling, asymmetry, or associated skin lesions. No contractures.  Skin & Extremity Inspection: Skin color, temperature, and hair growth are WNL. No peripheral edema or cyanosis. No masses, redness, swelling, asymmetry, or associated skin lesions. No contractures.  Functional ROM: Unrestricted ROM                  Functional ROM: Unrestricted ROM                  Muscle Tone/Strength: Functionally intact. No obvious neuro-muscular anomalies detected.  Muscle Tone/Strength: Functionally intact. No obvious neuro-muscular anomalies detected.  Sensory (Neurological): Unimpaired  Sensory (Neurological): Unimpaired        DTR: Patellar: deferred today Achilles: deferred  today Plantar: deferred today  DTR: Patellar: deferred today Achilles: deferred today Plantar: deferred today  Palpation: No palpable anomalies  Palpation: No palpable anomalies   Assessment & Plan  Primary Diagnosis & Pertinent Problem List: The primary encounter diagnosis was Lumbar facet syndrome (Bilateral). Diagnoses of Lumbosacral facet hypertrophy (Multilevel) (Bilateral), Lumbosacral Grade 1 Anterolisthesis of L5 over S1 (3 mm), Chronic low back pain (1ry area of Pain) (Bilateral) (R>L), DDD (degenerative disc disease), lumbosacral, Chronic pain syndrome, Vitamin D insufficiency, Osteopenia of lumbar spine, Osteopenia determined by x-ray, Petechial rash, and Idiopathic thrombocytopenic purpura (HCC) were also pertinent to this visit.  Visit Diagnosis: 1. Lumbar facet syndrome (Bilateral)   2. Lumbosacral facet hypertrophy (Multilevel) (Bilateral)   3. Lumbosacral Grade 1 Anterolisthesis of L5 over S1 (3 mm)   4. Chronic low back pain (1ry area of Pain) (Bilateral) (R>L)   5. DDD (degenerative disc disease), lumbosacral   6. Chronic pain syndrome   7. Vitamin D insufficiency   8. Osteopenia of lumbar spine   9. Osteopenia determined by x-ray   10. Petechial rash   11. Idiopathic thrombocytopenic purpura (HCC)    Problems updated and reviewed during this visit: Problem  Lumbosacral facet hypertrophy (Multilevel) (Bilateral)  Lumbosacral Grade 1 Anterolisthesis of L5 over S1 (3 mm)  Osteopenia Determined By X-Ray  Petechial Rash   Truncal and bilateral lower extremity petechial rash compatible/similar with/to that of immune thrombocytopoenia purpura.   Osteopenia of Lumbar Spine  Idiopathic Thrombocytopenic Purpura (Hcc)    Plan of Care  Pharmacotherapy (Medications Ordered): Meds ordered this encounter  Medications  . ergocalciferol (VITAMIN D2) 1.25 MG (50000 UT) capsule    Sig: Take 1 capsule (50,000 Units total) by mouth 2 (two) times a week. X 6 weeks.    Dispense:   12 capsule    Refill:  0    Fill one day early if pharmacy is closed on scheduled refill date. May substitute for generic, or similar, if available.  . Cholecalciferol (VITAMIN D3) 125 MCG (5000 UT) CAPS    Sig: Take 1 capsule (5,000 Units total) by mouth daily with breakfast. Take along with calcium and magnesium.    Dispense:  30 capsule    Refill:  5    Fill one day early if pharmacy is closed on scheduled refill date. May substitute for generic, or similar, if available.  . Magnesium 500 MG CAPS    Sig: Take 1 capsule (500 mg total) by mouth at bedtime.    Dispense:  180 capsule    Refill:  0    Fill one day early if pharmacy is closed on scheduled refill date. May substitute for generic, or similar, if available.  . calcium carbonate (CALCIUM 600) 600 MG TABS tablet    Sig: Take 2 tablets (1,200 mg total) by mouth daily with breakfast.    Dispense:  60 tablet    Refill:  5    Fill one day early if pharmacy is closed on scheduled refill date. May substitute for generic, or similar, if available.    Procedure Orders     LUMBAR FACET(MEDIAL BRANCH NERVE BLOCK) MBNB Lab Orders  No laboratory test(s) ordered today   Imaging Orders  No imaging studies ordered today   Referral Orders  No referral(s) requested today   Pharmacological management options:  Opioid Analgesics: I will not be prescribing any opioids at this time  Membrane stabilizer: None prescribed at this time Muscle relaxant: None prescribed at this time NSAID: None prescribed at this time Other analgesic(s): None prescribed at this time    Interventional management options: Planned, scheduled, and/or pending:    Diagnostic bilateral lumbar facet block #1    Considering:   Diagnostic bilateral lumbar facet block Possible bilateral lumbar facet RFA   PRN Procedures:   None at this time    Provider-requested follow-up: Return for Procedure (w/ sedation): (B) L-FCT BLK #1. Recent Visits Date Type Provider  Dept  03/01/20 Telemedicine Milinda Pointer, MD [facility]  02/20/20 Office Visit Milinda Pointer, MD [facility]  Showing recent visits within past 90 days and meeting all other requirements   Today's Visits Date Type Provider Dept  03/15/20 Office Visit Milinda Pointer, MD [facility]  Showing today's visits and meeting all other requirements   Future Appointments No visits were found meeting these conditions.  Showing future appointments within next 90 days and meeting all other requirements   Primary Care Physician: [name], MD Note by: Gaspar Cola, MD Date: 03/15/2020; Time: 2:21 PM

## 2020-03-15 NOTE — Patient Instructions (Addendum)
Facet Joint Block The facet joints connect the bones of the spine (vertebrae). They make it possible for you to bend, twist, and make other movements with your spine. They also keep you from bending too far, twisting too far, and making other extreme movements. A facet joint block is a procedure in which a numbing medicine (anesthetic) is injected into a facet joint. In many cases, an anti-inflammatory medicine (steroid) is also injected. A facet joint block may be done:  To diagnose neck or back pain. If the pain gets better after a facet joint block, it means the pain is probably coming from the facet joint. If the pain does not get better, it means the pain is probably not coming from the facet joint.  To relieve neck or back pain that is caused by an inflamed facet joint. A facet joint block is only done to relieve pain if the pain does not improve with other methods, such as medicine, exercise programs, and physical therapy. Tell a health care provider about:  Any allergies you have.  All medicines you are taking, including vitamins, herbs, eye drops, creams, and over-the-counter medicines.  Any problems you or family members have had with anesthetic medicines.  Any blood disorders you have.  Any surgeries you have had.  Any medical conditions you have or have had.  Whether you are pregnant or may be pregnant. What are the risks? Generally, this is a safe procedure. However, problems may occur, including:  Bleeding.  Injury to a nerve near the injection site.  Pain at the injection site.  Weakness or numbness in areas controlled by nerves near the injection site.  Infection.  Temporary fluid retention.  Allergic reactions to medicines or dyes.  Injury to other structures or organs near the injection site. What happens before the procedure? Medicines Ask your health care provider about:  Changing or stopping your regular medicines. This is especially important if you  are taking diabetes medicines or blood thinners.  Taking medicines such as aspirin and ibuprofen. These medicines can thin your blood. Do not take these medicines unless your health care provider tells you to take them.  Taking over-the-counter medicines, vitamins, herbs, and supplements. Eating and drinking Follow instructions from your health care provider about eating and drinking, which may include:  8 hours before the procedure - stop eating heavy meals or foods, such as meat, fried foods, or fatty foods.  6 hours before the procedure - stop eating light meals or foods, such as toast or cereal.  6 hours before the procedure - stop drinking milk or drinks that contain milk.  2 hours before the procedure - stop drinking clear liquids. Staying hydrated Follow instructions from your health care provider about hydration, which may include:  Up to 2 hours before the procedure - you may continue to drink clear liquids, such as water, clear fruit juice, black coffee, and plain tea. General instructions  Do not use any products that contain nicotine or tobacco for at least 4-6 weeks before the procedure. These products include cigarettes, e-cigarettes, and chewing tobacco. If you need help quitting, ask your health care provider.  Plan to have someone take you home from the hospital or clinic.  Ask your health care provider: ? How your surgery site will be marked. ? What steps will be taken to help prevent infection. These may include:  Removing hair at the surgery site.  Washing skin with a germ-killing soap.  Receiving antibiotic medicine. What happens during  the procedure?   You will put on a hospital gown.  You will lie on your stomach on an X-ray table. You may be asked to lie in a different position if an injection will be made in your neck.  Machines will be used to monitor your oxygen levels, heart rate, and blood pressure.  Your skin will be cleaned.  If an injection  will be made in your neck, an IV will be inserted into one of your veins. Fluids and medicine will flow directly into your body through the IV.  A numbing medicine (local anesthetic) will be applied to your skin. Your skin may sting or burn for a moment.  A video X-ray machine (fluoroscopy) will be used to find the joint. In some cases, a CT scan may be used.  A contrast dye may be injected into the facet joint area to help find the joint.  When the joint is located, an anesthetic will be injected into the joint through the needle.  Your health care provider will ask you whether you feel pain relief. ? If you feel relief, a steroid may be injected to provide pain relief for a longer period of time. ? If you do not feel relief or feel only partial relief, additional injections of an anesthetic may be made in other facet joints.  The needle will be removed.  Your skin will be cleaned.  A bandage (dressing) will be applied over each injection site. The procedure may vary among health care providers and hospitals. What happens after the procedure?  Your blood pressure, heart rate, breathing rate, and blood oxygen level will be monitored until you leave the hospital or clinic.  You will lie down and rest for a period of time. Summary  A facet joint block is a procedure in which a numbing medicine (anesthetic) is injected into a facet joint. An anti-inflammatory medicine (stereoid) may also be injected.  Follow instructions from your health care provider about medicines and eating and drinking before the procedure.  Do not use any products that contain nicotine or tobacco for at least 4-6 weeks before the procedure.  You will lie on your stomach for the procedure, but you may be asked to lie in a different position if an injection will be made in your neck.  When the joint is located, an anesthetic will be injected into the joint through the needle. This information is not intended to  replace advice given to you by your health care provider. Make sure you discuss any questions you have with your health care provider. Document Revised: 03/31/2019 Document Reviewed: 11/12/2018 Elsevier Patient Education  2020 [place]. _____________________________________________________   Radiofrequency Lesioning Radiofrequency lesioning is a procedure that is performed to relieve pain. The procedure is often used for back, neck, or arm pain. Radiofrequency lesioning involves the use of a machine that creates radio waves to make heat. During the procedure, the heat is applied to the nerve that carries the pain signal. The heat damages the nerve and interferes with the pain signal. Pain relief usually starts about 2 weeks after the procedure and lasts for 6 months to 1 year. You will be awake during the procedure. You will need to be able to talk with the health care provider during the procedure. Tell a health care provider about:  Any allergies you have.  All medicines you are taking, including vitamins, herbs, eye drops, creams, and over-the-counter medicines.  Any problems you or family members have had  with anesthetic medicines.  Any blood disorders you have.  Any surgeries you have had.  Any medical conditions you have or have had.  Whether you are pregnant or may be pregnant. What are the risks? Generally, this is a safe procedure. However, problems may occur, including:  Pain or soreness at the injection site.  Allergic reaction to medicines given during the procedure.  Bleeding.  Infection at the injection site.  Damage to nerves or blood vessels. What happens before the procedure? Staying hydrated Follow instructions from your health care provider about hydration, which may include:  Up to 2 hours before the procedure - you may continue to drink clear liquids, such as water, clear fruit juice, black coffee, and plain tea. Eating and drinking Follow  instructions from your health care provider about eating and drinking, which may include:  8 hours before the procedure - stop eating heavy meals or foods, such as meat, fried foods, or fatty foods.  6 hours before the procedure - stop eating light meals or foods, such as toast or cereal.  6 hours before the procedure - stop drinking milk or drinks that contain milk.  2 hours before the procedure - stop drinking clear liquids. Medicines Ask your health care provider about:  Changing or stopping your regular medicines. This is especially important if you are taking diabetes medicines or blood thinners.  Taking medicines such as aspirin and ibuprofen. These medicines can thin your blood. Do not take these medicines unless your health care provider tells you to take them.  Taking over-the-counter medicines, vitamins, herbs, and supplements. General instructions  Plan to have someone take you home from the hospital or clinic.  If you will be going home right after the procedure, plan to have someone with you for 24 hours.  Ask your health care provider what steps will be taken to help prevent infection. These may include: ? Removing hair at the procedure site. ? Washing skin with a germ-killing soap. ? Taking antibiotic medicine. What happens during the procedure?   An IV will be inserted into one of your veins.  You will be given one or more of the following: ? A medicine to help you relax (sedative). ? A medicine to numb the area (local anesthetic).  Your health care provider will insert a radiofrequency needle into the area to be treated. This is done with the help of a type of X-ray (fluoroscopy).  A wire that carries the radio waves (electrode) will be put through the radiofrequency needle.  An electrical pulse will be sent through the electrode to verify the correct nerve that is causing your pain. You will feel a tingling sensation, and you may have muscle twitching.  The  tissue around the needle tip will be heated by an electric current that comes from the radiofrequency machine. This will numb the nerves.  The needle will be removed.  A bandage (dressing) will be put on the insertion area. The procedure may vary among health care providers and hospitals. What happens after the procedure?  Your blood pressure, heart rate, breathing rate, and blood oxygen level will be monitored until you leave the hospital or clinic.  Return to your normal activities as told by your health care provider. Ask your health care provider what activities are safe for you.  Do not drive for 24 hours if you were given a sedative during your procedure. Summary  Radiofrequency lesioning is a procedure that is performed to relieve pain. The procedure  is often used for back, neck, or arm pain.  Radiofrequency lesioning involves the use of a machine that creates radio waves to make heat.  Plan to have someone take you home from the hospital or clinic. Do not drive for 24 hours if you were given a sedative during your procedure.  Return to your normal activities as told by your health care provider. Ask your health care provider what activities are safe for you. This information is not intended to replace advice given to you by your health care provider. Make sure you discuss any questions you have with your health care provider. Document Revised: 08/26/2018 Document Reviewed: 08/26/2018 Elsevier Patient Education  2020 [place].  ____________________________________________________________________________________________  Preparing for Procedure with Sedation  Procedure appointments are limited to planned procedures: . No Prescription Refills. . No disability issues will be discussed. . No medication changes will be discussed.  Instructions: . Oral Intake: Do not eat or drink anything for at least 3 hours prior to your procedure. (Exception: Blood Pressure Medication.  See below.) . Transportation: Unless otherwise stated by your physician, you may drive yourself after the procedure. . Blood Pressure Medicine: Do not forget to take your blood pressure medicine with a sip of water the morning of the procedure. If your Diastolic (lower reading)is above 100 mmHg, elective cases will be cancelled/rescheduled. . Blood thinners: These will need to be stopped for procedures. Notify our staff if you are taking any blood thinners. Depending on which one you take, there will be specific instructions on how and when to stop it. . Diabetics on insulin: Notify the staff so that you can be scheduled 1st case in the morning. If your diabetes requires high dose insulin, take only  of your normal insulin dose the morning of the procedure and notify the staff that you have done so. . Preventing infections: Shower with an antibacterial soap the morning of your procedure. . Build-up your immune system: Take 1000 mg of Vitamin C with every meal (3 times a day) the day prior to your procedure. Marland Kitchen Antibiotics: Inform the staff if you have a condition or reason that requires you to take antibiotics before dental procedures. . Pregnancy: If you are pregnant, call and cancel the procedure. . Sickness: If you have a cold, fever, or any active infections, call and cancel the procedure. . Arrival: You must be in the facility at least 30 minutes prior to your scheduled procedure. . Children: Do not bring children with you. . Dress appropriately: Bring dark clothing that you would not mind if they get stained. . Valuables: Do not bring any jewelry or valuables.  Reasons to call and reschedule or cancel your procedure: (Following these recommendations will minimize the risk of a serious complication.) . Surgeries: Avoid having procedures within 2 weeks of any surgery. (Avoid for 2 weeks before or after any surgery). . Flu Shots: Avoid having procedures within 2 weeks of a flu shots or . (Avoid  for 2 weeks before or after immunizations). . Barium: Avoid having a procedure within 7-10 days after having had a radiological study involving the use of radiological contrast. (Myelograms, Barium swallow or enema study). . Heart attacks: Avoid any elective procedures or surgeries for the initial 6 months after a "Myocardial Infarction" (Heart Attack). . Blood thinners: It is imperative that you stop these medications before procedures. Let us know if you if you take any blood thinner.  . Infection: Avoid procedures during or within two weeks of an  infection (including chest colds or gastrointestinal problems). Symptoms associated with infections include: Localized redness, fever, chills, night sweats or profuse sweating, burning sensation when voiding, cough, congestion, stuffiness, runny nose, sore throat, diarrhea, nausea, vomiting, cold or Flu symptoms, recent or current infections. It is specially important if the infection is over the area that we intend to treat. Marland Kitchen Heart and lung problems: Symptoms that may suggest an active cardiopulmonary problem include: cough, chest pain, breathing difficulties or shortness of breath, dizziness, ankle swelling, uncontrolled high or unusually low blood pressure, and/or palpitations. If you are experiencing any of these symptoms, cancel your procedure and contact your primary care physician for an evaluation.  Remember:  Regular Business hours are:  Monday to Thursday 8:00 AM to 4:00 PM  Provider's Schedule: Milinda Pointer, MD:  Procedure days: Tuesday and Thursday 7:30 AM to 4:00 PM  Gillis Santa, MD:  Procedure days: Monday and Wednesday 7:30 AM to 4:00 PM ____________________________________________________________________________________________   ____________________________________________________________________________________________  General Risks and Possible Complications  Patient Responsibilities: It is important that you read this  as it is part of your informed consent. It is our duty to inform you of the risks and possible complications associated with treatments offered to you. It is your responsibility as a patient to read this and to ask questions about anything that is not clear or that you believe was not covered in this document.  Patient's Rights: You have the right to refuse treatment. You also have the right to change your mind, even after initially having agreed to have the treatment done. However, under this last option, if you wait until the last second to change your mind, you may be charged for the materials used up to that point.  Introduction: Medicine is not an Chief Strategy Officer. Everything in Medicine, including the lack of treatment(s), carries the potential for danger, harm, or loss (which is by definition: Risk). In Medicine, a complication is a secondary problem, condition, or disease that can aggravate an already existing one. All treatments carry the risk of possible complications. The fact that a side effects or complications occurs, does not imply that the treatment was conducted incorrectly. It must be clearly understood that these can happen even when everything is done following the highest safety standards.  No treatment: You can choose not to proceed with the proposed treatment alternative. The "PRO(s)" would include: avoiding the risk of complications associated with the therapy. The "CON(s)" would include: not getting any of the treatment benefits. These benefits fall under one of three categories: diagnostic; therapeutic; and/or palliative. Diagnostic benefits include: getting information which can ultimately lead to improvement of the disease or symptom(s). Therapeutic benefits are those associated with the successful treatment of the disease. Finally, palliative benefits are those related to the decrease of the primary symptoms, without necessarily curing the condition (example: decreasing the pain from  a flare-up of a chronic condition, such as incurable terminal cancer).  General Risks and Complications: These are associated to most interventional treatments. They can occur alone, or in combination. They fall under one of the following six (6) categories: no benefit or worsening of symptoms; bleeding; infection; nerve damage; allergic reactions; and/or death. 1. No benefits or worsening of symptoms: In Medicine there are no guarantees, only probabilities. No healthcare provider can ever guarantee that a medical treatment will work, they can only state the probability that it may. Furthermore, there is always the possibility that the condition may worsen, either directly, or indirectly, as a consequence of  the treatment. 2. Bleeding: This is more common if the patient is taking a blood thinner, either prescription or over the counter (example: Goody Powders, Fish oil, Aspirin, Garlic, etc.), or if suffering a condition associated with impaired coagulation (example: Hemophilia, cirrhosis of the liver, low platelet counts, etc.). However, even if you do not have one on these, it can still happen. If you have any of these conditions, or take one of these drugs, make sure to notify your treating physician. 3. Infection: This is more common in patients with a compromised immune system, either due to disease (example: diabetes, cancer, human immunodeficiency virus [HIV], etc.), or due to medications or treatments (example: therapies used to treat cancer and rheumatological diseases). However, even if you do not have one on these, it can still happen. If you have any of these conditions, or take one of these drugs, make sure to notify your treating physician. 4. Nerve Damage: This is more common when the treatment is an invasive one, but it can also happen with the use of medications, such as those used in the treatment of cancer. The damage can occur to small secondary nerves, or to large primary ones, such as  those in the spinal cord and brain. This damage may be temporary or permanent and it may lead to impairments that can range from temporary numbness to permanent paralysis and/or brain death. 5. Allergic Reactions: Any time a substance or material comes in contact with our body, there is the possibility of an allergic reaction. These can range from a mild skin rash (contact dermatitis) to a severe systemic reaction (anaphylactic reaction), which can result in death. 6. Death: In general, any medical intervention can result in death, most of the time due to an unforeseen complication. ____________________________________________________________________________________________  Preparing for Procedure with Sedation Instructions: . Oral Intake: Do not eat or drink anything for at least 8 hours prior to your procedure. . Transportation: Public transportation is not allowed. Bring an adult driver. The driver must be physically present in our waiting room before any procedure can be started. Marland Kitchen Physical Assistance: Bring an adult capable of physically assisting you, in the event you need help. . Blood Pressure Medicine: Take your blood pressure medicine with a sip of water the morning of the procedure. . Insulin: Take only  of your normal insulin dose. . Preventing infections: Shower with an antibacterial soap the morning of your procedure. . Build-up your immune system: Take 1000 mg of Vitamin C with every meal (3 times a day) the day prior to your procedure. . Pregnancy: If you are pregnant, call and cancel the procedure. . Sickness: If you have a cold, fever, or any active infections, call and cancel the procedure. . Arrival: You must be in the facility at least 30 minutes prior to your scheduled procedure. . Children: Do not bring children with you. . Dress appropriately: Bring dark clothing that you would not mind if they get stained. . Valuables: Do not bring any jewelry or valuables. Procedure  appointments are reserved for interventional treatments only. Marland Kitchen No Prescription Refills. . No medication changes will be discussed during procedure appointments. . No disability issues will be discussed. Facet Blocks Patient Information  Description: The facets are joints in the spine between the vertebrae.  Like any joints in the body, facets can become irritated and painful.  Arthritis can also effect the facets.  By injecting steroids and local anesthetic in and around these joints, we can temporarily block the nerve  supply to them.  Steroids act directly on irritated nerves and tissues to reduce selling and inflammation which often leads to decreased pain.  Facet blocks may be done anywhere along the spine from the neck to the low back depending upon the location of your pain.   After numbing the skin with local anesthetic (like Novocaine), a small needle is passed onto the facet joints under x-ray guidance.  You may experience a sensation of pressure while this is being done.  The entire block usually lasts about 15-25 minutes.   Conditions which may be treated by facet blocks:  Low back/buttock pain Neck/shoulder pain Certain types of headaches  Preparation for the injection:  Do not eat any solid food or dairy products within 8 hours of your appointment. You may drink clear liquid up to 3 hours before appointment.  Clear liquids include water, black coffee, juice or soda.  No milk or cream please. You may take your regular medication, including pain medications, with a sip of water before your appointment.  Diabetics should hold regular insulin (if taken separately) and take 1/2 normal NPH dose the morning of the procedure.  Carry some sugar containing items with you to your appointment. A driver must accompany you and be prepared to drive you home after your procedure. Bring all your current medications with you. An IV may be inserted and sedation may be given at the discretion of the  physician. A blood pressure cuff, EKG and other monitors will often be applied during the procedure.  Some patients may need to have extra oxygen administered for a short period. You will be asked to provide medical information, including your allergies and medications, prior to the procedure.  We must know immediately if you are taking blood thinners (like Coumadin/Warfarin) or if you are allergic to IV iodine contrast (dye).  We must know if you could possible be pregnant.  Possible side-effects:  Bleeding from needle site Infection (rare, may require surgery) Nerve injury (rare) Numbness & tingling (temporary) Difficulty urinating (rare, temporary) Spinal headache (a headache worse with upright posture) Light-headedness (temporary) Pain at injection site (serveral days) Decreased blood pressure (rare, temporary) Weakness in arm/leg (temporary) Pressure sensation in back/neck (temporary)   Call if you experience:  Fever/chills associated with headache or increased back/neck pain Headache worsened by an upright position New onset, weakness or numbness of an extremity below the injection site Hives or difficulty breathing (go to the emergency room) Inflammation or drainage at the injection site(s) Severe back/neck pain greater than usual New symptoms which are concerning to you  Please note:  Although the local anesthetic injected can often make your back or neck feel good for several hours after the injection, the pain will likely return. It takes 3-7 days for steroids to work.  You may not notice any pain relief for at least one week.  If effective, we will often do a series of 2-3 injections spaced 3-6 weeks apart to maximally decrease your pain.  After the initial series, you may be a candidate for a more permanent nerve block of the facets.  If you have any questions, please call #[phone]) [facility]

## 2020-03-20 ENCOUNTER — Encounter: Payer: Self-pay | Admitting: Oncology

## 2020-06-13 ENCOUNTER — Other Ambulatory Visit: Payer: Self-pay | Admitting: Pain Medicine

## 2020-06-13 DIAGNOSIS — E559 Vitamin D deficiency, unspecified: Secondary | ICD-10-CM

## 2020-06-13 DIAGNOSIS — M8580 Other specified disorders of bone density and structure, unspecified site: Secondary | ICD-10-CM

## 2020-06-13 DIAGNOSIS — M8588 Other specified disorders of bone density and structure, other site: Secondary | ICD-10-CM

## 2020-10-08 DIAGNOSIS — C4490 Unspecified malignant neoplasm of skin, unspecified: Secondary | ICD-10-CM | POA: Insufficient documentation

## 2020-10-29 ENCOUNTER — Other Ambulatory Visit: Payer: Self-pay

## 2020-10-29 DIAGNOSIS — I714 Abdominal aortic aneurysm, without rupture, unspecified: Secondary | ICD-10-CM

## 2020-10-30 ENCOUNTER — Telehealth: Payer: Self-pay

## 2020-10-30 NOTE — Telephone Encounter (Signed)
Called with questions about Gas-X for procedure. Can take with Miralax. He thought he was having a carotid study done, but he is having an EVAR. We discussed it at length that it is a follow up study he needs and he can talk to provider about his carotids tomorrow. Patient verbalized understanding.

## 2020-10-31 ENCOUNTER — Ambulatory Visit (HOSPITAL_COMMUNITY)
Admission: RE | Admit: 2020-10-31 | Discharge: 2020-10-31 | Disposition: A | Discharge status: Home / Self Care | Payer: Medicare Other | Source: Ambulatory Visit | Attending: Vascular Surgery | Admitting: Vascular Surgery

## 2020-10-31 ENCOUNTER — Ambulatory Visit (INDEPENDENT_AMBULATORY_CARE_PROVIDER_SITE_OTHER)
Admission: RE | Admit: 2020-10-31 | Discharge: 2020-10-31 | Disposition: A | Discharge status: Home / Self Care | Payer: Medicare Other | Source: Ambulatory Visit | Attending: Vascular Surgery | Admitting: Vascular Surgery

## 2020-10-31 ENCOUNTER — Other Ambulatory Visit: Payer: Self-pay

## 2020-10-31 ENCOUNTER — Ambulatory Visit (INDEPENDENT_AMBULATORY_CARE_PROVIDER_SITE_OTHER): Payer: Medicare Other | Admitting: Physician Assistant

## 2020-10-31 ENCOUNTER — Other Ambulatory Visit (HOSPITAL_COMMUNITY): Payer: Self-pay | Admitting: Vascular Surgery

## 2020-10-31 VITALS — BP 153/73 | HR 57 | Temp 97.2°F | Resp 20 | Ht 69.0 in | Wt 159.0 lb

## 2020-10-31 DIAGNOSIS — I6523 Occlusion and stenosis of bilateral carotid arteries: Secondary | ICD-10-CM | POA: Diagnosis not present

## 2020-10-31 DIAGNOSIS — I714 Abdominal aortic aneurysm, without rupture, unspecified: Secondary | ICD-10-CM

## 2020-10-31 NOTE — Progress Notes (Signed)
Office Note     CC:  follow up Requesting Provider:  [name], MD  HPI: [name] is a 86 y.o. ([birth year]) male who presents for routine follow up of EVAR and for carotid artery stenosis. He has been followed for his carotid stenosis for several years now. The right ICA stenosis has been 40-59% and left ICA stenosis 60-79%. He has been without signs or symptoms of TIA or stroke. He presently remains asymptomatic. He denies any amaurosis fugax or monocular blindness, slurred speech, facial drooping, upper extremity weakness or numbness. He does report a week long discomfort in the left side of his neck. Says he woke up with it hurting and hurts to turn towards the left side.  He had his EVAR back in September 2020 by Dr. Oneida Alar. 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 On duplex and CTA the stent graft has looked good and without any endoleak.  He denies any abdominal or back pain that is new. He does have some chronic back pain that has been present for many years. He does not have any lower extremity claudication, rest pain or tissue loss. He is very active and continues to walk on treadmill or use stationary bike for an hour a day and lift light weights. He still plays golf  The pt is on a statin for cholesterol management.  The pt is not on a daily aspirin.   Other AC: none The pt is on CCB and ARB for hypertension.   The pt is not diabetic.  Tobacco hx:  Former, quit 1998  Past Medical History:  Diagnosis Date  . AAA (abdominal aortic aneurysm) ([place]) 1999   a. treated with open repair in 1999 with subsequent distal anastomotic pseudoaneurysm that is being followed by Dr. Oneida Alar.  . Adenomatous colon polyp   . Aorta aneurysm ([place])   . Arthritis   . Basal cell cancer Dec. 24, 2013   Nose  . CAD (coronary artery disease)    a. s/p CABG 03/2018.  Marland Kitchen Cancer [facility]) Aug. 2013   prostate Seeding  implant  . Carotid artery occlusion   . Enlarged prostate 2008    Thermotherapy  . Gastroesophageal reflux disease   . GI bleed   . Hyperlipidemia   . Hypertension   . Mild aortic stenosis   . Myelodysplasia (myelodysplastic syndrome) ([place])   . Pancytopenia ([place])    sees Dr. Grayland Ormond  . Peripheral vascular disease ([place])   . Postoperative atrial fibrillation ([place])   . Psoriasis   . S/P CABG (coronary artery bypass graft)     Past Surgical History:  Procedure Laterality Date  . ABDOMINAL AORTIC ANEURYSM REPAIR  1999  . ABDOMINAL AORTIC ENDOVASCULAR STENT GRAFT N/A 09/12/2019   Procedure: ABDOMINAL AORTIC ENDOVASCULAR STENT GRAFT;  Surgeon: Elam Dutch, MD;  Location: [place];  Service: Vascular;  Laterality: N/A;  . CARDIAC CATHETERIZATION  10/2009  . CHOLECYSTECTOMY    . CORONARY ARTERY BYPASS GRAFT N/A 04/08/2018   Procedure: CORONARY ARTERY BYPASS GRAFTING (CABG) x four, using left mammary artery and right leg greater saphenous vein harvested endoscopically.;  Surgeon: Gaye Pollack, MD;  Location: [facility] OR;  Service: Open Heart Surgery;  Laterality: N/A;  . ESOPHAGOGASTRODUODENOSCOPY (EGD) WITH PROPOFOL N/A 06/26/2016   Procedure: ESOPHAGOGASTRODUODENOSCOPY (EGD) WITH PROPOFOL;  Surgeon: Lollie Sails, MD;  Location: [facility]COPY;  Service: Endoscopy;  Laterality: N/A;  . EYE SURGERY  1990   cataract surgery bilateral  . LEFT HEART  CATH AND CORONARY ANGIOGRAPHY N/A 03/15/2018   Procedure: LEFT HEART CATH AND CORONARY ANGIOGRAPHY;  Surgeon: Jettie Booze, MD;  Location: [place] CV LAB;  Service: Cardiovascular;  Laterality: N/A;  . PROSTATE SURGERY    . SPINE SURGERY  2009   ruptured disc  . TEE WITHOUT CARDIOVERSION N/A 04/08/2018   Procedure: TRANSESOPHAGEAL ECHOCARDIOGRAM (TEE);  Surgeon: Gaye Pollack, MD;  Location: [place];  Service: Open Heart Surgery;  Laterality: N/A;  . TONSILLECTOMY     removed as a teenager  . Visceral angiogram  05/14/2012   Right  groin area  . VISCERAL ANGIOGRAM N/A 05/14/2012   Procedure:  VISCERAL ANGIOGRAM;  Surgeon: Elam Dutch, MD;  Location: [facility] CATH LAB;  Service: Cardiovascular;  Laterality: N/A;    Social History   Socioeconomic History  . Marital status: Married    Spouse name: Not on file  . Number of children: Not on file  . Years of education: Not on file  . Highest education level: Not on file  Occupational History  . Not on file  Tobacco Use  . Smoking status: Former Smoker    Types: Cigarettes    Quit date: 04/29/1997    Years since quitting: 23.5  . Smokeless tobacco: Never Used  Vaping Use  . Vaping Use: Never used  Substance and Sexual Activity  . Alcohol use: No  . Drug use: No  . Sexual activity: Not on file  Other Topics Concern  . Not on file  Social History Narrative  . Not on file   Social Determinants of Health   Financial Resource Strain:   . Difficulty of Paying Living Expenses: Not on file  Food Insecurity:   . Worried About Charity fundraiser in the Last Year: Not on file  . Ran Out of Food in the Last Year: Not on file  Transportation Needs:   . Lack of Transportation (Medical): Not on file  . Lack of Transportation (Non-Medical): Not on file  Physical Activity:   . Days of Exercise per Week: Not on file  . Minutes of Exercise per Session: Not on file  Stress:   . Feeling of Stress : Not on file  Social Connections:   . Frequency of Communication with Friends and Family: Not on file  . Frequency of Social Gatherings with Friends and Family: Not on file  . Attends Religious Services: Not on file  . Active Member of Clubs or Organizations: Not on file  . Attends Archivist Meetings: Not on file  . Marital Status: Not on file  Intimate Partner Violence:   . Fear of Current or Ex-Partner: Not on file  . Emotionally Abused: Not on file  . Physically Abused: Not on file  . Sexually Abused: Not on file    Family History  Problem Relation Age of Onset  . Heart attack Father   . Heart disease Father         After age 25  . COPD Father   . Cancer Sister        Breast and Brain  . Heart disease Brother        before age 61  . Heart attack Brother     Current Outpatient Medications  Medication Sig Dispense Refill  . amLODipine (NORVASC) 10 MG tablet Take 0.5 tablets (5 mg total) by mouth daily. 60 tablet 1  . Glucosamine-Chondroitin (COSAMIN DS PO) Take 2 tablets by mouth daily at 12 noon.    Marland Kitchen  Magnesium 100 MG TABS Take 200 mg by mouth daily with supper.    . Melatonin 3 MG TABS Take 5 mg by mouth at bedtime.     . Multiple Vitamin (MULTIVITAMIN WITH MINERALS) TABS tablet Take 1 tablet by mouth daily at 12 noon. Centrum Silver    . oxyCODONE-acetaminophen (PERCOCET/ROXICET) 5-325 MG tablet Take 1 tablet by mouth every 8 (eight) hours as needed for moderate pain or severe pain.    . polyethylene glycol (MIRALAX / GLYCOLAX) packet Take 17 g by mouth daily. (Patient taking differently: Take 17 g by mouth daily as needed for mild constipation. ) 14 each 0  . rosuvastatin (CRESTOR) 20 MG tablet Take 20 mg by mouth at bedtime.     . valsartan-hydrochlorothiazide (DIOVAN HCT) 160-12.5 MG tablet Take 1 tablet by mouth daily. 30 tablet 1  . calcium carbonate (CALCIUM 600) 600 MG TABS tablet Take 2 tablets (1,200 mg total) by mouth daily with breakfast. (Patient not taking: Reported on 10/31/2020) 60 tablet 5   No current facility-administered medications for this visit.    Allergies  Allergen Reactions  . Ciprofloxacin Other (See Comments)    Abdominal Pain      REVIEW OF SYSTEMS:  [X]  denotes positive finding, [ ]  denotes negative finding Cardiac  Comments:  Chest pain or chest pressure:    Shortness of breath upon exertion:    Short of breath when lying flat:    Irregular heart rhythm:        Vascular    Pain in calf, thigh, or hip brought on by ambulation:    Pain in feet at night that wakes you up from your sleep:     Blood clot in your veins:    Leg swelling:         Pulmonary     Oxygen at home:    Productive cough:     Wheezing:         Neurologic    Sudden weakness in arms or legs:     Sudden numbness in arms or legs:     Sudden onset of difficulty speaking or slurred speech:    Temporary loss of vision in one eye:     Problems with dizziness:         Gastrointestinal    Blood in stool:     Vomited blood:         Genitourinary    Burning when urinating:     Blood in urine:        Psychiatric    Major depression:         Hematologic    Bleeding problems:    Problems with blood clotting too easily:        Skin    Rashes or ulcers: X Petechial diffuse rash of lower extremities abdomen and chest      Constitutional    Fever or chills:      PHYSICAL EXAMINATION:  Vitals:   10/31/20 0855  BP: (!) 144/69  Pulse: (!) 57  Resp: 20  Temp: (!) 97.2 F (36.2 C)  TempSrc: Temporal  SpO2: 100%  Weight: 159 lb (72.1 kg)  Height: 5\' 9"  (1.753 m)    General:  WDWN in NAD; vital signs documented above Gait: Normal HENT: WNL, normocephalic Pulmonary: normal non-labored breathing , without  wheezing Cardiac: irregular HR, without  Murmurs with carotid bruit bilaterally Abdomen: soft, NT, no masses Skin: with petechial rash of bilateral lower extremity, abdomen and chest Vascular  Exam/Pulses:  Right Left  Radial 2+ (normal) 2+ (normal)  Ulnar 2+ (normal) 2+ (normal)  Femoral 2+ (normal) 2+ (normal)  Popliteal 2+ (normal) 2+ (normal)  DP 2+ (normal) 2+ (normal)  PT 1+ (weak) 1+ (weak)   Extremities: without ischemic changes, without Gangrene , without cellulitis; without open wounds;  Musculoskeletal: no muscle wasting or atrophy  Neurologic: A&O X 3;  No focal weakness or paresthesias are detected Psychiatric:  The pt has Normal affect.   Non-Invasive Vascular Imaging:   Endovascular Aortic Repair (EVAR):  +----------+--------------+--------------+----------------+----------------  ----+       Diameter AP  Diameter  TransVelocities   Comments              (cm)     (cm)     (cm/sec)                 +----------+--------------+--------------+----------------+----------------  ----+  Aorta   3.57     2.80     45       Thrombosed                                   pseudoaneurysm  2.3                              cm            +----------+--------------+--------------+----------------+----------------  ----+  Right Limb1.40     1.50     61                    +----------+--------------+--------------+----------------+----------------  ----+  Left Limb 1.40     1.70     66                    +----------+--------------+--------------+----------------+----------------  ----+  Previous Diameter: 3.4 x 3.55 cm  Carotid Duplex: Right Carotid: Velocities in the right ICA are consistent with a 60-79% stenosis.   Left Carotid: Velocities in the left ICA are consistent with a 60-79% stenosis.   Vertebrals: Bilateral vertebral arteries demonstrate antegrade flow.    ASSESSMENT/PLAN:: 86 y.o. male here for routine follow up for bilateral carotid stenosis and EVAR. He remains without any symptoms associated with his carotid stenosis or EVAR. He is having some left sided neck pain which I do not think is associated with his carotid stenosis and think is likely musculoskeletal. I have recommended warm compresses for this. He could also take NSAIDs to help with discomfort. His carotid duplex today shows increased velocities from prior study in the right ICA consistent with 60-79% stenosis ( previously 40-59%). The left ICA remains essentially unchanged. EVAR duplex is stable. Continues to show thrombosed pseudoaneurysms. No endoleak visualized. - He will continue his current medications as  prescribed - I have encouraged him to continue his exercise therapy - He will need EVAR duplex Annually - I reviewed signs and symptoms of TIA and Stroke with patient and his son and should these occur he understands to proceed immediately to the ER - He will follow up in 6 months with repeat carotid duplex   Karoline Caldwell, PA-C [facility] [phone]  Clinic MD:  Dr. Oneida Alar

## 2020-11-02 ENCOUNTER — Ambulatory Visit: Payer: Medicare Other

## 2020-11-02 ENCOUNTER — Other Ambulatory Visit (HOSPITAL_COMMUNITY): Payer: Medicare Other

## 2021-07-11 ENCOUNTER — Telehealth: Payer: Self-pay | Admitting: *Deleted

## 2021-07-11 NOTE — Telephone Encounter (Signed)
Pt called concerned about his "stomach feeling off" after a "really hard bowel movement" yesterday. Pt states there was a lot of blood on the paper but there hasn't been any blood today. Pt requested a scan of his abdomen. Explained to pt that we are a vascular surgery office and he should contact his PCP or GI  for gastrointestinal concerns. Pt verbalized understanding.

## 2021-07-22 ENCOUNTER — Emergency Department
Admission: EM | Admit: 2021-07-22 | Discharge: 2021-07-22 | Disposition: A | Discharge status: Home / Self Care | Payer: Medicare Other | Attending: Emergency Medicine | Admitting: Emergency Medicine

## 2021-07-22 ENCOUNTER — Other Ambulatory Visit: Payer: Self-pay

## 2021-07-22 ENCOUNTER — Emergency Department: Payer: Medicare Other

## 2021-07-22 DIAGNOSIS — Z85828 Personal history of other malignant neoplasm of skin: Secondary | ICD-10-CM | POA: Diagnosis not present

## 2021-07-22 DIAGNOSIS — Z951 Presence of aortocoronary bypass graft: Secondary | ICD-10-CM | POA: Diagnosis not present

## 2021-07-22 DIAGNOSIS — I2510 Atherosclerotic heart disease of native coronary artery without angina pectoris: Secondary | ICD-10-CM | POA: Insufficient documentation

## 2021-07-22 DIAGNOSIS — Z8546 Personal history of malignant neoplasm of prostate: Secondary | ICD-10-CM | POA: Insufficient documentation

## 2021-07-22 DIAGNOSIS — Z87891 Personal history of nicotine dependence: Secondary | ICD-10-CM | POA: Insufficient documentation

## 2021-07-22 DIAGNOSIS — Z8679 Personal history of other diseases of the circulatory system: Secondary | ICD-10-CM

## 2021-07-22 DIAGNOSIS — Z79899 Other long term (current) drug therapy: Secondary | ICD-10-CM | POA: Diagnosis not present

## 2021-07-22 DIAGNOSIS — R1084 Generalized abdominal pain: Secondary | ICD-10-CM | POA: Diagnosis not present

## 2021-07-22 LAB — CBC
HCT: 30.5 % — ABNORMAL LOW (ref 39.0–52.0)
Hemoglobin: 10 g/dL — ABNORMAL LOW (ref 13.0–17.0)
MCH: 30.4 pg (ref 26.0–34.0)
MCHC: 32.8 g/dL (ref 30.0–36.0)
MCV: 92.7 fL (ref 80.0–100.0)
Platelets: 51 10*3/uL — ABNORMAL LOW (ref 150–400)
RBC: 3.29 MIL/uL — ABNORMAL LOW (ref 4.22–5.81)
RDW: 14.6 % (ref 11.5–15.5)
WBC: 4.4 10*3/uL (ref 4.0–10.5)
nRBC: 0 % (ref 0.0–0.2)

## 2021-07-22 LAB — COMPREHENSIVE METABOLIC PANEL
ALT: 25 U/L (ref 0–44)
AST: 31 U/L (ref 15–41)
Albumin: 4.5 g/dL (ref 3.5–5.0)
Alkaline Phosphatase: 40 U/L (ref 38–126)
Anion gap: 11 (ref 5–15)
BUN: 30 mg/dL — ABNORMAL HIGH (ref 8–23)
CO2: 26 mmol/L (ref 22–32)
Calcium: 9.3 mg/dL (ref 8.9–10.3)
Chloride: 100 mmol/L (ref 98–111)
Creatinine, Ser: 1.09 mg/dL (ref 0.61–1.24)
GFR, Estimated: >60 mL/min (ref >60)
Glucose, Bld: 145 mg/dL — ABNORMAL HIGH (ref 70–99)
Potassium: 3.8 mmol/L (ref 3.5–5.1)
Sodium: 137 mmol/L (ref 135–145)
Total Bilirubin: 0.9 mg/dL (ref 0.3–1.2)
Total Protein: 6.9 g/dL (ref 6.5–8.1)

## 2021-07-22 LAB — LIPASE, BLOOD: Lipase: 42 U/L (ref 11–51)

## 2021-07-22 MED ORDER — IOHEXOL 350 MG/ML SOLN
75.0000 mL | Freq: Once | INTRAVENOUS | Status: AC | PRN
  Administered 2021-07-22: 75 mL via INTRAVENOUS

## 2021-07-22 NOTE — ED Notes (Signed)
See triage note, pt reports mid abd pain x1 weeks since passing hard stool a week ago. Reports intermittent diarrhea, last episode yesterday. Thinks stool was dark yesterday. Reports decreased pain today.  Denies vomiting NAD noted. Ambulatory Alert and oriented

## 2021-07-22 NOTE — ED Provider Notes (Signed)
[facility] Emergency Department Provider Note   ____________________________________________   Event Date/Time   First MD Initiated Contact with Patient 07/22/21 0945     (approximate)  I have reviewed the triage vital signs and the nursing notes.   HISTORY  Chief Complaint Abdominal Pain    HPI [name] is a 87 y.o. male with past medical history of hypertension, hyperlipidemia, CAD status post CABG, AAA status post graft, myelodysplastic syndrome, atrial fibrillation, and chronic pain syndrome who presents to the ED complaining of abdominal pain.  Patient reports that he has had increasing pain in his abdomen for about the past week, describes pain as constant and sharp, not exacerbated or alleviated by anything.  He denies any associated fevers, dysuria, flank pain, nausea, or vomiting.  He does state that he had a very hard bowel movement earlier in the week that has been followed by constipation, pain seem to come on following the hard bowel movement.  He noticed earlier today that his stool was dark, but he had not specifically noticed any blood.  He does not currently take any blood thinners.  He was initially evaluated at urgent care, subsequently referred to the ED for further evaluation due to his history of AAA.        Past Medical History:  Diagnosis Date   AAA (abdominal aortic aneurysm) ([place]) 1999   a. treated with open repair in 1999 with subsequent distal anastomotic pseudoaneurysm that is being followed by Dr. Oneida Alar.   Adenomatous colon polyp    Aorta aneurysm ([place])    Arthritis    Basal cell cancer Dec. 24, 2013   Nose   CAD (coronary artery disease)    a. s/p CABG 03/2018.   Cancer [facility]) Aug. 2013   prostate Seeding  implant   Carotid artery occlusion    Enlarged prostate 2008   Thermotherapy   Gastroesophageal reflux disease    GI bleed    Hyperlipidemia    Hypertension    Mild aortic stenosis    Myelodysplasia  (myelodysplastic syndrome) (HCC)    Pancytopenia (HCC)    sees Dr. Grayland Ormond   Peripheral vascular disease [facility])    Postoperative atrial fibrillation ([place])    Psoriasis    S/P CABG (coronary artery bypass graft)     Patient Active Problem List   Diagnosis Date Noted   Nonmelanoma skin cancer 10/08/2020   Osteopenia determined by x-ray 03/15/2020   Lumbosacral facet hypertrophy (Multilevel) (Bilateral) 03/15/2020   Lumbosacral Grade 1 Anterolisthesis of L5 over S1 (3 mm) 03/15/2020   Petechial rash 03/15/2020   Idiopathic thrombocytopenic purpura ([place]) 03/15/2020   Vitamin D insufficiency 03/01/2020   Osteopenia of lumbar spine 03/01/2020   Pharmacologic therapy 02/20/2020   Disorder of skeletal system 02/20/2020   Problems influencing health status 02/20/2020   Chronic abdominal pain (2ry area of Pain) (LLQ) (Left) 02/20/2020   Thoracic radiculitis (T11) (Left) 02/20/2020   DDD (degenerative disc disease), thoracic 02/20/2020   Chronic hip pain (Bilateral) 02/20/2020   Lumbar facet syndrome (Bilateral) 02/20/2020   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   Diverticulosis of sigmoid colon 02/20/2020   Generalized abdominal pain 02/10/2020   DDD (degenerative disc disease), lumbosacral 12/01/2019   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[place]) 09/12/2019   Atrial fibrillation ([place]) 04/13/2018   S/P CABG x 4 04/08/2018   Angina pectoris ([place])    On long term drug therapy 12/26/2017   Other long term (current) drug therapy 12/26/2017   Pain  in joint of right shoulder 12/26/2017   Postlaminectomy syndrome, lumbar region 12/26/2017   Chronic pain syndrome 12/26/2017   Chronic low back pain (1ry area of Pain) (Bilateral) (R>L) 11/03/2017   Drug-induced constipation 11/03/2017   Purpura ([place]) 11/03/2017   Sensorineural hearing loss (SNHL) of both ears 11/03/2017   Arthritis 01/26/2017   MDS (myelodysplastic syndrome) ([place]) 09/01/2016   CMML (chronic myelomonocytic leukemia) ([place]) 08/21/2016    Thrombocytopenia ([place]) 08/17/2016   Aneurysm of abdominal vessel ([place]) 07/23/2016   Anemia 05/06/2016   Benign essential hypertension 08/07/2015   Bilateral carotid artery stenosis 08/07/2015   CAD (coronary artery disease) 08/07/2015   Hyperlipidemia, mixed 08/07/2015   Personal history of other malignant neoplasm of skin 12/13/2012   Malignant neoplasm of prostate ([place]) 05/27/2012   Chronic vascular insufficiency of intestine ([place]) 05/24/2012   Abdominal pain, right lower quadrant 05/24/2012   Occlusion and stenosis of carotid artery without mention of cerebral infarction 04/29/2012    Past Surgical History:  Procedure Laterality Date   ABDOMINAL AORTIC ANEURYSM REPAIR  1999   ABDOMINAL AORTIC ENDOVASCULAR STENT GRAFT N/A 09/12/2019   Procedure: ABDOMINAL AORTIC ENDOVASCULAR STENT GRAFT;  Surgeon: Elam Dutch, MD;  Location[facility] OR;  Service: Vascular;  Laterality: N/A;   CARDIAC CATHETERIZATION  10/2009   CHOLECYSTECTOMY     CORONARY ARTERY BYPASS GRAFT N/A 04/08/2018   Procedure: CORONARY ARTERY BYPASS GRAFTING (CABG) x four, using left mammary artery and right leg greater saphenous vein harvested endoscopically.;  Surgeon: Gaye Pollack, MD;  Location: [place];  Service: Open Heart Surgery;  Laterality: N/A;   ESOPHAGOGASTRODUODENOSCOPY (EGD) WITH PROPOFOL N/A 06/26/2016   Procedure: ESOPHAGOGASTRODUODENOSCOPY (EGD) WITH PROPOFOL;  Surgeon: Lollie Sails, MD;  Location[facility] ENDOSCOPY;  Service: Endoscopy;  Laterality: N/A;   EYE SURGERY  1990   cataract surgery bilateral   LEFT HEART CATH AND CORONARY ANGIOGRAPHY N/A 03/15/2018   Procedure: LEFT HEART CATH AND CORONARY ANGIOGRAPHY;  Surgeon: Jettie Booze, MD;  Location: [place] CV LAB;  Service: Cardiovascular;  Laterality: N/A;   PROSTATE SURGERY     SPINE SURGERY  2009   ruptured disc   TEE WITHOUT CARDIOVERSION N/A 04/08/2018   Procedure: TRANSESOPHAGEAL ECHOCARDIOGRAM (TEE);  Surgeon: Gaye Pollack, MD;   Location: [place];  Service: Open Heart Surgery;  Laterality: N/A;   TONSILLECTOMY     removed as a teenager   Visceral angiogram  05/14/2012   Right  groin area   VISCERAL ANGIOGRAM N/A 05/14/2012   Procedure: VISCERAL ANGIOGRAM;  Surgeon: Elam Dutch, MD;  Location: [facility] CATH LAB;  Service: Cardiovascular;  Laterality: N/A;    Prior to Admission medications   Medication Sig Start Date End Date Taking? Authorizing Provider  amLODipine (NORVASC) 10 MG tablet Take 0.5 tablets (5 mg total) by mouth daily. 05/11/18   Gaye Pollack, MD  calcium carbonate (CALCIUM 600) 600 MG TABS tablet Take 2 tablets (1,200 mg total) by mouth daily with breakfast. Patient not taking: Reported on 10/31/2020 03/15/20 09/11/20  Milinda Pointer, MD  Glucosamine-Chondroitin (COSAMIN DS PO) Take 2 tablets by mouth daily at 12 noon.    [provider]  Magnesium 100 MG TABS Take 200 mg by mouth daily with supper.    [provider]  Melatonin 3 MG TABS Take 5 mg by mouth at bedtime.     [provider]  Multiple Vitamin (MULTIVITAMIN WITH MINERALS) TABS tablet Take 1 tablet by mouth daily at 12 noon. Centrum  Silver    [provider]  oxyCODONE-acetaminophen (PERCOCET/ROXICET) 5-325 MG tablet Take 1 tablet by mouth every 8 (eight) hours as needed for moderate pain or severe pain.    [provider]  polyethylene glycol (MIRALAX / GLYCOLAX) packet Take 17 g by mouth daily. Patient taking differently: Take 17 g by mouth daily as needed for mild constipation.  09/14/17   Cuthriell, Charline Bills, PA-C  rosuvastatin (CRESTOR) 20 MG tablet Take 20 mg by mouth at bedtime.  06/30/18   [provider]  valsartan-hydrochlorothiazide (DIOVAN HCT) 160-12.5 MG tablet Take 1 tablet by mouth daily. 04/14/18   Elgie Collard, PA-C    Allergies Ciprofloxacin  Family History  Problem Relation Age of Onset   Heart attack Father    Heart disease Father        After age 71   COPD  Father    Cancer Sister        Breast and Brain   Heart disease Brother        before age 52   Heart attack Brother     Social History Social History   Tobacco Use   Smoking status: Former    Types: Cigarettes    Quit date: 04/29/1997    Years since quitting: 24.2   Smokeless tobacco: Never  Vaping Use   Vaping Use: Never used  Substance Use Topics   Alcohol use: No   Drug use: No    Review of Systems  Constitutional: No fever/chills Eyes: No visual changes. ENT: No sore throat. Cardiovascular: Denies chest pain. Respiratory: Denies shortness of breath. Gastrointestinal: Positive for abdominal pain.  No nausea, no vomiting.  Positive for constipation and diarrhea.  Positive for dark stool. Genitourinary: Negative for dysuria. Musculoskeletal: Negative for back pain. Skin: Negative for rash. Neurological: Negative for headaches, focal weakness or numbness.  ____________________________________________   PHYSICAL EXAM:  VITAL SIGNS: ED Triage Vitals  Enc Vitals Group     BP 07/22/21 0920 (!) 155/65     Pulse Rate 07/22/21 0920 63     Resp 07/22/21 0920 18     Temp --      Temp src --      SpO2 07/22/21 0920 100 %     Weight 07/22/21 0921 155 lb (70.3 kg)     Height 07/22/21 0921 '5\' 9"'$  (1.753 m)     Head Circumference --      Peak Flow --      Pain Score 07/22/21 0921 4     Pain Loc --      Pain Edu? --      Excl. in [place]? --     Constitutional: Alert and oriented. Eyes: Conjunctivae are normal. Head: Atraumatic. Nose: No congestion/rhinnorhea. Mouth/Throat: Mucous membranes are moist. Neck: Normal ROM Cardiovascular: Normal rate, regular rhythm. Grossly normal heart sounds. Respiratory: Normal respiratory effort.  No retractions. Lungs CTAB. Gastrointestinal: Soft and diffusely tender to palpation with no rebound or guarding. No distention. Genitourinary: deferred Musculoskeletal: No lower extremity tenderness nor edema. Neurologic:  Normal speech and  language. No gross focal neurologic deficits are appreciated. Skin:  Skin is warm, dry and intact. No rash noted. Psychiatric: Mood and affect are normal. Speech and behavior are normal.  ____________________________________________   LABS (all labs ordered are listed, but only abnormal results are displayed)  Labs Reviewed  COMPREHENSIVE METABOLIC PANEL - Abnormal; Notable for the following components:      Result Value   Glucose, Bld 145 (*)  BUN 30 (*)    All other components within normal limits  CBC - Abnormal; Notable for the following components:   RBC 3.29 (*)    Hemoglobin 10.0 (*)    HCT 30.5 (*)    Platelets 51 (*)    All other components within normal limits  LIPASE, BLOOD  URINALYSIS, COMPLETE (UACMP) WITH MICROSCOPIC   ____________________________________________  EKG  ED ECG REPORT I, Blake Divine, the attending physician, personally viewed and interpreted this ECG.   Date: 07/22/2021  EKG Time: 9:25  Rate: 58  Rhythm: normal sinus rhythm  Axis: LAD  Intervals:right bundle branch block and left anterior fascicular block  ST&T Change: None   PROCEDURES  Procedure(s) performed (including Critical Care):  Procedures   ____________________________________________   INITIAL IMPRESSION / ASSESSMENT AND PLAN / ED COURSE      87 year old male with past medical history of hypertension, hyperlipidemia, CAD status post CABG, AAA status post graft, atrial fibrillation, myelodysplastic syndrome, and chronic pain syndrome who presents to the ED complaining of 1 week of worsening abdominal pain with alternating diarrhea and constipation.  Patient is diffusely tender on exam, we will further assess with CT angiogram to ensure no complication related to his AAA with graft in place.  Hemoglobin is stable compared to previous and I doubt significant GI bleed, however CTA we will further assess for aortoenteric fistula.  LFTs and lipase are within normal limits,  patient declines pain medication at this time.  No symptoms to suggest UTI.  CTA of abdomen/pelvis is negative for complication related to patient's AAA or other acute process.  Patient's pain is minimal on reassessment, no further dark bowel movements here in the ED.  He is appropriate for discharge home with PCP follow-up, was counseled to return to the ED for new worsening symptoms.  Patient and son agree with plan.      ____________________________________________   FINAL CLINICAL IMPRESSION(S) / ED DIAGNOSES  Final diagnoses:  Generalized abdominal pain  S/P AAA (abdominal aortic aneurysm) repair     ED Discharge Orders     None        Note:  This document was prepared using Dragon voice recognition software and may include unintentional dictation errors.    Blake Divine, MD 07/22/21 1340

## 2021-07-22 NOTE — ED Triage Notes (Addendum)
Pt to ER via POV with complaints of hard stool/ constipation x4 days. Reports having a normal bowel movement yesterday that was very dark in colour. Concerned about possible blood in stool but didn't see any. Denies blood thinner usage. Hx of GI bleed and low platelets.   Pt also reports non-radiating umbilical abdominal pain, [facility] recommended he gets an ultrasound.

## 2022-07-21 NOTE — Progress Notes (Signed)
Cardiology Office Note   Date:  07/22/2022   ID:  [name], DOB 11-Mar-1926, MRN [id number]  [facility]    No chief complaint on file.    Wt Readings from Last 3 Encounters:  07/22/22 152 lb (68.9 kg)  07/22/21 155 lb (70.3 kg)  10/31/20 159 lb (72.1 kg)       History of Present Illness: [name] is a 88 y.o. male  Who has had CABG in 2019 at age 61.   In the past, He took benzonate for a cough.  He noticed a slower HR, to the 40s, and he had some mild lightheadedness and low BP readings.  He stopped the medicine afte ra few ays and his BP improved but HR stayed low.    Prior to this, he has been feeling well.  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   Plan from Jan 2020: " I think some of this may be PACs causing his heart rate to be under counted.  Will check 24-hour Holter monitor.  Mild lightheadedness, but this seems rather nonspecific.  He has not had syncope.  I think he will be fine to continue to train and participate in his senior games."    I last saw him in 2020 to provide cardiac clearance for endovascular stent graft with Dr. Oneida Alar.   He had a fall a few weeks ago.  He hit his head while playing ping pong at the senior center.   [facility] from [facility] show: "VL negative for blood clot, CT head does show a small chronic left frontal subdural collection with minimal left to right midline shift (3 mm). Plan to check platelet level today, and discuss with neurosurgery for appropriate follow-up. CT C-spine without acute findings, does show a subpleural nodule needing outpatient CT follow-up (discussed these findings with son and patient). X-ray lumbar spine does show mild anterior wedging of the L1 vertebral body, recommending CT if concern for fracture. CT has been ordered.  12:49 PM Spoke with Dr. Shanon Brow, neurosurgeon on-call, who reviewed images and does not feel there is any further intervention required. No follow-up either.  CT lumbar spine without acute trauma. Patient and son are reassured. Plan for discharge home, recommend PCP follow-up for outpatient imaging of lung nodule found on CT. Patient and son agreeable to plan."  No aspirin due to low platelets.   Denies : Chest pain. Dizziness. Leg edema. Nitroglycerin use. Orthopnea. Palpitations. Paroxysmal nocturnal dyspnea. Shortness of breath. Syncope.    He uses a recumbent bike.   Past Medical History:  Diagnosis Date   AAA (abdominal aortic aneurysm) ([place]) 1999   a. treated with open repair in 1999 with subsequent distal anastomotic pseudoaneurysm that is being followed by Dr. Oneida Alar.   Adenomatous colon polyp    Aorta aneurysm ([place])    Arthritis    Basal cell cancer Dec. 24, 2013   Nose   CAD (coronary artery disease)    a. s/p CABG 03/2018.   Cancer [facility]) Aug. 2013   prostate Seeding  implant   Carotid artery occlusion    Enlarged prostate 2008   Thermotherapy   Gastroesophageal reflux disease    GI bleed    Hyperlipidemia    Hypertension    Mild aortic stenosis    Myelodysplasia (myelodysplastic syndrome) (HCC)    Pancytopenia (HCC)    sees Dr. Grayland Ormond   Peripheral vascular disease [facility])    Postoperative atrial  fibrillation ([place])    Psoriasis    S/P CABG (coronary artery bypass graft)     Past Surgical History:  Procedure Laterality Date   ABDOMINAL AORTIC ANEURYSM REPAIR  1999   ABDOMINAL AORTIC ENDOVASCULAR STENT GRAFT N/A 09/12/2019   Procedure: ABDOMINAL AORTIC ENDOVASCULAR STENT GRAFT;  Surgeon: Elam Dutch, MD;  Location: [facility] OR;  Service: Vascular;  Laterality: N/A;   CARDIAC CATHETERIZATION  10/2009   CHOLECYSTECTOMY     CORONARY ARTERY BYPASS GRAFT N/A 04/08/2018   Procedure: CORONARY ARTERY BYPASS GRAFTING (CABG) x four, using left mammary artery and right leg greater saphenous vein harvested endoscopically.;  Surgeon: Gaye Pollack, MD;  Location: [facility] OR;  Service: Open Heart Surgery;  Laterality: N/A;    ESOPHAGOGASTRODUODENOSCOPY (EGD) WITH PROPOFOL N/A 06/26/2016   Procedure: ESOPHAGOGASTRODUODENOSCOPY (EGD) WITH PROPOFOL;  Surgeon: Lollie Sails, MD;  Location: [facility] ENDOSCOPY;  Service: Endoscopy;  Laterality: N/A;   EYE SURGERY  1990   cataract surgery bilateral   LEFT HEART CATH AND CORONARY ANGIOGRAPHY N/A 03/15/2018   Procedure: LEFT HEART CATH AND CORONARY ANGIOGRAPHY;  Surgeon: Jettie Booze, MD;  Location: [place] CV LAB;  Service: Cardiovascular;  Laterality: N/A;   PROSTATE SURGERY     SPINE SURGERY  2009   ruptured disc   TEE WITHOUT CARDIOVERSION N/A 04/08/2018   Procedure: TRANSESOPHAGEAL ECHOCARDIOGRAM (TEE);  Surgeon: Gaye Pollack, MD;  Location: [place];  Service: Open Heart Surgery;  Laterality: N/A;   TONSILLECTOMY     removed as a teenager   Visceral angiogram  05/14/2012   Right  groin area   VISCERAL ANGIOGRAM N/A 05/14/2012   Procedure: VISCERAL ANGIOGRAM;  Surgeon: Elam Dutch, MD;  Location: [facility] CATH LAB;  Service: Cardiovascular;  Laterality: N/A;     Current Outpatient Medications  Medication Sig Dispense Refill   amLODipine (NORVASC) 10 MG tablet Take 0.5 tablets (5 mg total) by mouth daily. 60 tablet 1   Glucosamine-Chondroitin (COSAMIN DS PO) Take 2 tablets by mouth daily at 12 noon.     LUMIGAN 0.01 % SOLN 1 drop daily.     Magnesium 100 MG TABS Take 200 mg by mouth daily with supper.     Melatonin 3 MG TABS Take 5 mg by mouth at bedtime.      oxyCODONE-acetaminophen (PERCOCET/ROXICET) 5-325 MG tablet Take 1 tablet by mouth every 8 (eight) hours as needed for moderate pain or severe pain.     polyethylene glycol (MIRALAX / GLYCOLAX) packet Take 17 g by mouth daily. 14 each 0   rosuvastatin (CRESTOR) 20 MG tablet Take 20 mg by mouth at bedtime.      valsartan-hydrochlorothiazide (DIOVAN HCT) 160-12.5 MG tablet Take 1 tablet by mouth daily. 30 tablet 1   No current facility-administered medications for this visit.    Allergies:    Ciprofloxacin    Social History:  The patient  reports that he quit smoking about 25 years ago. His smoking use included cigarettes. He has never used smokeless tobacco. He reports that he does not drink alcohol and does not use drugs.   Family History:  The patient's family history includes COPD in his father; Cancer in his sister; Heart attack in his brother and father; Heart disease in his brother and father.    ROS:  Please see the history of present illness.   Otherwise, review of systems are positive for fluctuating BP.   All other systems are reviewed and negative.    PHYSICAL EXAM:  VS:  BP 122/60   Pulse 63   Ht '5\' 8"'$  (1.727 m)   Wt 152 lb (68.9 kg)   SpO2 99%   BMI 23.11 kg/m  , BMI Body mass index is 23.11 kg/m. GEN: Well nourished, well developed, in no acute distress HEENT: normal Neck: no JVD, carotid bruits, or masses Cardiac: RRR, premature beats; 3/6 holosystolic murmurs, rubs, or gallops,no edema  Respiratory:  clear to auscultation bilaterally, normal work of breathing GI: soft, nontender, nondistended, + BS MS: no deformity or atrophy Skin: warm and dry, no rash Neuro:  Strength and sensation are intact Psych: euthymic mood, full affect   EKG:   The ekg ordered today demonstrates normal sinus rhythm with PACs, right bundle branch block, left anterior fascicular block, bifascicular block   Recent Labs: No results found for requested labs within last 365 days.   Lipid Panel No results found for: "CHOL", "TRIG", "HDL", "CHOLHDL", "VLDL", "LDLCALC", "LDLDIRECT"   Other studies Reviewed: Additional studies/ records that were reviewed today with results demonstrating: [facility] records reviewed.  Primary care and blood work is done there..   ASSESSMENT AND PLAN:  CAD: No angina.  Continue aggressive secondary prevention.  Due to low platelets, he does not take aspirin.  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  Would not add any antiplatelet therapy.   Continue lipid-lowering therapy. AAA: Status post repair with Dr. Oneida Alar several years ago. Bradycardia: Does have evidence of conduction disease based on his wide QRS but no symptoms of syncope.  His one fall was related to tripping while playing table tennis. Hypertension: Blood pressure is somewhat labile with systolic ranging from 419 to the 140s at home.  No symptoms of lightheadedness.  Would not make any changes to his therapy.  Continue to stay well-hydrated, especially on warmer days. Occluded right carotid, and documented in 2016.  Followed by vascular.   Current medicines are reviewed at length with the patient today.  The patient concerns regarding his medicines were addressed.  The following changes have been made:  No change  Labs/ tests ordered today include:  No orders of the defined types were placed in this encounter.   Recommend 150 minutes/week of aerobic exercise Low fat, low carb, high fiber diet recommended  Disposition:   FU in 1 year   Signed, Larae Grooms, MD  07/22/2022 10:58 AM    [facility] [place], [place], [place]  [address] Phone: [phone]; Fax: [phone]

## 2022-07-22 ENCOUNTER — Encounter: Payer: Self-pay | Admitting: Interventional Cardiology

## 2022-07-22 ENCOUNTER — Ambulatory Visit (INDEPENDENT_AMBULATORY_CARE_PROVIDER_SITE_OTHER): Payer: Medicare Other | Admitting: Interventional Cardiology

## 2022-07-22 VITALS — BP 122/60 | HR 63 | Ht 68.0 in | Wt 152.0 lb

## 2022-07-22 DIAGNOSIS — I25118 Atherosclerotic heart disease of native coronary artery with other forms of angina pectoris: Secondary | ICD-10-CM

## 2022-07-22 DIAGNOSIS — I6523 Occlusion and stenosis of bilateral carotid arteries: Secondary | ICD-10-CM

## 2022-07-22 DIAGNOSIS — I10 Essential (primary) hypertension: Secondary | ICD-10-CM

## 2022-07-22 DIAGNOSIS — I7143 Infrarenal abdominal aortic aneurysm, without rupture: Secondary | ICD-10-CM | POA: Diagnosis not present

## 2022-07-22 NOTE — Patient Instructions (Signed)

## 2023-02-05 ENCOUNTER — Other Ambulatory Visit: Payer: Self-pay | Admitting: Internal Medicine

## 2023-02-05 DIAGNOSIS — C61 Malignant neoplasm of prostate: Secondary | ICD-10-CM

## 2023-02-05 DIAGNOSIS — I714 Abdominal aortic aneurysm, without rupture, unspecified: Secondary | ICD-10-CM

## 2023-02-05 DIAGNOSIS — R159 Full incontinence of feces: Secondary | ICD-10-CM

## 2023-02-12 ENCOUNTER — Ambulatory Visit
Admission: RE | Admit: 2023-02-12 | Discharge: 2023-02-12 | Disposition: A | Discharge status: Home / Self Care | Payer: Medicare Other | Source: Ambulatory Visit | Attending: Internal Medicine | Admitting: Internal Medicine

## 2023-02-12 DIAGNOSIS — I714 Abdominal aortic aneurysm, without rupture, unspecified: Secondary | ICD-10-CM | POA: Insufficient documentation

## 2023-02-12 DIAGNOSIS — R159 Full incontinence of feces: Secondary | ICD-10-CM | POA: Diagnosis present

## 2023-02-12 DIAGNOSIS — C61 Malignant neoplasm of prostate: Secondary | ICD-10-CM | POA: Diagnosis present

## 2023-02-12 MED ORDER — IOHEXOL 300 MG/ML  SOLN
100.0000 mL | Freq: Once | INTRAMUSCULAR | Status: AC | PRN
  Administered 2023-02-12: 100 mL via INTRAVENOUS

## 2023-09-20 ENCOUNTER — Other Ambulatory Visit: Payer: Self-pay

## 2023-09-20 ENCOUNTER — Emergency Department
Admission: EM | Admit: 2023-09-20 | Discharge: 2023-09-20 | Disposition: A | Discharge status: Home / Self Care | Payer: Medicare Other | Attending: Emergency Medicine | Admitting: Emergency Medicine

## 2023-09-20 DIAGNOSIS — D696 Thrombocytopenia, unspecified: Secondary | ICD-10-CM

## 2023-09-20 DIAGNOSIS — S00512A Abrasion of oral cavity, initial encounter: Secondary | ICD-10-CM | POA: Diagnosis present

## 2023-09-20 DIAGNOSIS — X58XXXA Exposure to other specified factors, initial encounter: Secondary | ICD-10-CM | POA: Diagnosis not present

## 2023-09-20 LAB — CBC WITH DIFFERENTIAL/PLATELET
Abs Immature Granulocytes: 0.01 10*3/uL (ref 0.00–0.07)
Basophils Absolute: 0 10*3/uL (ref 0.0–0.1)
Basophils Relative: 0 %
Eosinophils Absolute: 0 10*3/uL (ref 0.0–0.5)
Eosinophils Relative: 0 %
HCT: 35.1 % — ABNORMAL LOW (ref 39.0–52.0)
Hemoglobin: 11.2 g/dL — ABNORMAL LOW (ref 13.0–17.0)
Immature Granulocytes: 0 %
Lymphocytes Relative: 14 %
Lymphs Abs: 0.3 10*3/uL — ABNORMAL LOW (ref 0.7–4.0)
MCH: 29.4 pg (ref 26.0–34.0)
MCHC: 31.9 g/dL (ref 30.0–36.0)
MCV: 92.1 fL (ref 80.0–100.0)
Monocytes Absolute: 0.7 10*3/uL (ref 0.1–1.0)
Monocytes Relative: 30 %
Neutro Abs: 1.3 10*3/uL — ABNORMAL LOW (ref 1.7–7.7)
Neutrophils Relative %: 56 %
Platelets: 30 10*3/uL — ABNORMAL LOW (ref 150–400)
RBC: 3.81 MIL/uL — ABNORMAL LOW (ref 4.22–5.81)
RDW: 15 % (ref 11.5–15.5)
Smear Review: DECREASED
WBC: 2.4 10*3/uL — ABNORMAL LOW (ref 4.0–10.5)
nRBC: 0 % (ref 0.0–0.2)

## 2023-09-20 NOTE — Discharge Instructions (Signed)
You were seen today for your tongue bleeding.  No obvious bleeding at this point.  Your platelets are low today and I would like you to have them rechecked within the week for reassessment and follow-up with your regular doctor.  Otherwise no evidence of significant bleed.  Please avoid brushing her tongue or additional trauma to the tongue.

## 2023-09-20 NOTE — ED Provider Notes (Signed)
[facility] Provider Note    Event Date/Time   First MD Initiated Contact with Patient 09/20/23 1133     (approximate)   History   tongue bleeding   HPI [name] is a 89 y.o. male presenting today for tongue bleeding.  Patient has a history of thrombocytopenia and reports he had some bleeding from his tongue last night.  He had noticed a couple areas as he brushes his tongue every day several days ago but stopped brushing it.  Last night he felt like he was spitting out blood from a spot on his tongue.  Denies bleeding elsewhere.  Denies other trauma to the tongue or biting his tongue.  No bleeding is present on arrival to the emergency department.     Physical Exam   Triage Vital Signs: ED Triage Vitals  Encounter Vitals Group     BP 09/20/23 1118 (!) 153/75     Systolic BP Percentile --      Diastolic BP Percentile --      Pulse Rate 09/20/23 1118 74     Resp 09/20/23 1118 18     Temp 09/20/23 1118 98 F (36.7 C)     Temp Source 09/20/23 1118 Oral     SpO2 09/20/23 1118 94 %     Weight 09/20/23 1136 151 lb 14.4 oz (68.9 kg)     Height 09/20/23 1136 5\' 8"  (1.727 m)     Head Circumference --      Peak Flow --      Pain Score 09/20/23 1116 2     Pain Loc --      Pain Education --      Exclude from Growth Chart --     Most recent vital signs: Vitals:   09/20/23 1118  BP: (!) 153/75  Pulse: 74  Resp: 18  Temp: 98 F (36.7 C)  SpO2: 94%   I have reviewed the vital signs. General:  Awake, alert, no acute distress. Head:  Normocephalic, Atraumatic. EENT:  PERRL, EOMI, Oral mucosa pink and moist, Neck is supple.  1 small area of abrasion on the tongue with no active bleeding at this time.  No evidence of bleeding elsewhere within the oropharynx. Cardiovascular: Regular rate, 2+ distal pulses. Respiratory:  Normal respiratory effort, symmetrical expansion, no distress.   Extremities:  Moving all four extremities through full ROM  without pain.   Neuro:  Alert and oriented.  Interacting appropriately.   Skin:  Warm, dry, no rash.   Psych: Appropriate affect.    ED Results / Procedures / Treatments   Labs (all labs ordered are listed, but only abnormal results are displayed) Labs Reviewed  CBC WITH DIFFERENTIAL/PLATELET - Abnormal; Notable for the following components:      Result Value   WBC 2.4 (*)    RBC 3.81 (*)    Hemoglobin 11.2 (*)    HCT 35.1 (*)    Platelets 30 (*)    All other components within normal limits     EKG    RADIOLOGY    PROCEDURES:  Critical Care performed: No  Procedures   MEDICATIONS ORDERED IN ED: Medications - No data to display   IMPRESSION / MDM / ASSESSMENT AND PLAN / ED COURSE  I reviewed the triage vital signs and the nursing notes.  Differential diagnosis includes, but is not limited to, tongue abrasion.  Patient's presentation is most consistent with acute, uncomplicated illness.  Patient is a 89 year old male presenting today for abrasion to his tongue with bleeding.  On arrival to the ED no active bleeding and no obvious laceration present to the tongue.  No bleeding elsewhere within the oropharynx.  Does have chronic history of thrombocytopenia between 30 and 50 and so CBC was ordered.  This shows platelets at 30 but otherwise stable anemia.  Given no active bleeding at this time and no larger laceration site, patient does not need platelet transfusion.  He is otherwise stable and safe for discharge at this time.  Did recommend follow-up within the week for repeat CBC check as well as assessment by his primary care provider.  Given strict return precautions for worsening bleeding symptoms.  Clinical Course as of 09/20/23 1304  Sun Sep 20, 2023  1241 CBC with Differential(!) Mild leukopenia but no significant anemia today.  Does have thrombocytopenia but appears consistent with prior baseline.  No active bleeding at this time  and does not require platelet transfusion.  Will discharge with return precautions. [DW]    Clinical Course User Index [DW] [name], MD     FINAL CLINICAL IMPRESSION(S) / ED DIAGNOSES   Final diagnoses:  Abrasion of tongue, initial encounter  Thrombocytopenia (HCC)     Rx / DC Orders   ED Discharge Orders     None        Note:  This document was prepared using Dragon voice recognition software and may include unintentional dictation errors.   [name], MD 09/20/23 [phone]

## 2023-09-20 NOTE — ED Triage Notes (Signed)
Pt comes with c/o tongue bleeding. Pt states he brushed his tongue after he brushes his teeth. Pt states last night it started to bleed a lot. Pt states it continued this morning. Pt does have low platelets per family. Pt has little bleeding noted. Pt is not on thinners.

## 2023-10-19 ENCOUNTER — Other Ambulatory Visit: Payer: Self-pay | Admitting: Internal Medicine

## 2023-10-19 DIAGNOSIS — R197 Diarrhea, unspecified: Secondary | ICD-10-CM

## 2023-10-19 DIAGNOSIS — R1032 Left lower quadrant pain: Secondary | ICD-10-CM

## 2023-10-20 ENCOUNTER — Encounter: Payer: Self-pay | Admitting: Radiology

## 2023-10-20 ENCOUNTER — Ambulatory Visit: Payer: Medicare Other

## 2023-10-20 ENCOUNTER — Emergency Department: Payer: Medicare Other

## 2023-10-20 ENCOUNTER — Observation Stay
Admission: EM | Admit: 2023-10-20 | Discharge: 2023-10-20 | Disposition: A | Discharge status: Home / Self Care | Payer: Medicare Other | Attending: Internal Medicine | Admitting: Internal Medicine

## 2023-10-20 ENCOUNTER — Other Ambulatory Visit: Payer: Self-pay

## 2023-10-20 DIAGNOSIS — D649 Anemia, unspecified: Secondary | ICD-10-CM | POA: Diagnosis present

## 2023-10-20 DIAGNOSIS — Z951 Presence of aortocoronary bypass graft: Secondary | ICD-10-CM

## 2023-10-20 DIAGNOSIS — D696 Thrombocytopenia, unspecified: Secondary | ICD-10-CM | POA: Diagnosis present

## 2023-10-20 DIAGNOSIS — K5900 Constipation, unspecified: Secondary | ICD-10-CM | POA: Diagnosis not present

## 2023-10-20 DIAGNOSIS — R1084 Generalized abdominal pain: Principal | ICD-10-CM

## 2023-10-20 DIAGNOSIS — I4891 Unspecified atrial fibrillation: Secondary | ICD-10-CM | POA: Diagnosis not present

## 2023-10-20 DIAGNOSIS — C9310 Chronic myelomonocytic leukemia not having achieved remission: Secondary | ICD-10-CM | POA: Diagnosis present

## 2023-10-20 DIAGNOSIS — D693 Immune thrombocytopenic purpura: Secondary | ICD-10-CM | POA: Diagnosis present

## 2023-10-20 DIAGNOSIS — T402X5A Adverse effect of other opioids, initial encounter: Secondary | ICD-10-CM | POA: Diagnosis present

## 2023-10-20 DIAGNOSIS — I2510 Atherosclerotic heart disease of native coronary artery without angina pectoris: Secondary | ICD-10-CM | POA: Insufficient documentation

## 2023-10-20 DIAGNOSIS — I10 Essential (primary) hypertension: Secondary | ICD-10-CM | POA: Diagnosis not present

## 2023-10-20 DIAGNOSIS — G894 Chronic pain syndrome: Secondary | ICD-10-CM | POA: Diagnosis present

## 2023-10-20 LAB — CBC
HCT: 33.4 % — ABNORMAL LOW (ref 39.0–52.0)
Hemoglobin: 11.3 g/dL — ABNORMAL LOW (ref 13.0–17.0)
MCH: 29.7 pg (ref 26.0–34.0)
MCHC: 33.8 g/dL (ref 30.0–36.0)
MCV: 87.9 fL (ref 80.0–100.0)
Platelets: 34 10*3/uL — ABNORMAL LOW (ref 150–400)
RBC: 3.8 MIL/uL — ABNORMAL LOW (ref 4.22–5.81)
RDW: 14.5 % (ref 11.5–15.5)
WBC: 3.8 10*3/uL — ABNORMAL LOW (ref 4.0–10.5)
nRBC: 0 % (ref 0.0–0.2)

## 2023-10-20 LAB — COMPREHENSIVE METABOLIC PANEL
ALT: 32 U/L (ref 0–44)
AST: 39 U/L (ref 15–41)
Albumin: 4.9 g/dL (ref 3.5–5.0)
Alkaline Phosphatase: 39 U/L (ref 38–126)
Anion gap: 12 (ref 5–15)
BUN: 19 mg/dL (ref 8–23)
CO2: 22 mmol/L (ref 22–32)
Calcium: 9.1 mg/dL (ref 8.9–10.3)
Chloride: 95 mmol/L — ABNORMAL LOW (ref 98–111)
Creatinine, Ser: 0.83 mg/dL (ref 0.61–1.24)
GFR, Estimated: >60 mL/min (ref >60)
Glucose, Bld: 122 mg/dL — ABNORMAL HIGH (ref 70–99)
Potassium: 3.4 mmol/L — ABNORMAL LOW (ref 3.5–5.1)
Sodium: 129 mmol/L — ABNORMAL LOW (ref 135–145)
Total Bilirubin: 1.3 mg/dL — ABNORMAL HIGH (ref 0.3–1.2)
Total Protein: 7 g/dL (ref 6.5–8.1)

## 2023-10-20 LAB — URINALYSIS, ROUTINE W REFLEX MICROSCOPIC
Bilirubin Urine: NEGATIVE
Glucose, UA: NEGATIVE mg/dL
Hgb urine dipstick: NEGATIVE
Ketones, ur: NEGATIVE mg/dL
Leukocytes,Ua: NEGATIVE
Nitrite: NEGATIVE
Protein, ur: NEGATIVE mg/dL
Specific Gravity, Urine: 1.017 (ref 1.005–1.030)
pH: 6 (ref 5.0–8.0)

## 2023-10-20 LAB — LIPASE, BLOOD: Lipase: 43 U/L (ref 11–51)

## 2023-10-20 MED ORDER — SORBITOL 70 % SOLN
960.0000 mL | TOPICAL_OIL | Freq: Once | ORAL | Status: AC
  Administered 2023-10-20: 960 mL via RECTAL
  Filled 2023-10-20: qty 240

## 2023-10-20 MED ORDER — ONDANSETRON HCL 4 MG/2ML IJ SOLN
4.0000 mg | Freq: Once | INTRAMUSCULAR | Status: AC
  Administered 2023-10-20: 4 mg via INTRAVENOUS
  Filled 2023-10-20: qty 2

## 2023-10-20 MED ORDER — LACTULOSE 10 GM/15ML PO SOLN
20.0000 g | Freq: Once | ORAL | Status: AC
  Administered 2023-10-20: 20 g via ORAL
  Filled 2023-10-20: qty 30

## 2023-10-20 MED ORDER — IOHEXOL 300 MG/ML  SOLN
100.0000 mL | Freq: Once | INTRAMUSCULAR | Status: AC | PRN
  Administered 2023-10-20: 100 mL via INTRAVENOUS

## 2023-10-20 MED ORDER — MORPHINE SULFATE (PF) 4 MG/ML IV SOLN
4.0000 mg | Freq: Once | INTRAVENOUS | Status: AC
  Administered 2023-10-20: 4 mg via INTRAVENOUS
  Filled 2023-10-20: qty 1

## 2023-10-20 NOTE — Discharge Instructions (Addendum)
Take MiraLAX to help with the constipation and drink plenty of water and Gatorade to help prevent dehydration and return to the ER for worsening symptoms or any other concerns    IMPRESSION: Moderate left-sided colonic stool. The bowel otherwise is nondilated with several fluid-filled loops of bowel including distal ileum, colon. The stomach is also distended with air and fluid.   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 Small endoleak again suggested.

## 2023-10-20 NOTE — ED Provider Notes (Signed)
6:34 PM Assumed care for off going team.   Blood pressure 137/60, pulse 61, temperature (!) 97.4 F (36.3 C), temperature source Oral, resp. rate 19, height 5\' 8"  (1.727 m), weight 68.9 kg, SpO2 99%.  See their HPI for full report but in brief pedning enema   MPRESSION: Moderate left-sided colonic stool. The bowel otherwise is nondilated with several fluid-filled loops of bowel including distal ileum, colon. The stomach is also distended with air and fluid.   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 Small endoleak again suggested.   I clarified with the radiologist because no prior CTs have commented on a endograft leak but it seems like it has been there since 2022.  Discussed with Dr. Wyn Quaker from vascular and this is going to be nonoperative given it is stable and doubt symptoms are from this.  Discussed the above with the family and they expressed understanding.  He still having significant difficulties voiding and significant pain.  I considered doing a rectal disimpaction but platelets are 30.  I went to try to admit patient to the hospitalist for continued bowel regimen and but when I went to go reevaluate patient he reports having a good bowel movement and he would like to be discharged home at this time.  He declines admission and his son can be with him overnight to help him and then will return if there are any worsening symptoms   Concha Se, MD 10/20/23 2015

## 2023-10-20 NOTE — ED Notes (Signed)
This RN assisted pt to bathroom.

## 2023-10-20 NOTE — ED Provider Notes (Signed)
[facility] Provider Note    Event Date/Time   First MD Initiated Contact with Patient 10/20/23 1221     (approximate)   History   Abdominal Pain   HPI [name] is a 89 y.o. male with chronic pain syndrome, chronic abdominal pain, myelodysplastic syndrome, CAD, HTN, A-fib presenting today for abdominal pain.  Patient states he has had generalized abdominal pain associated with nausea over the past several days.  He has not had any bowel movements in 4 days and only notes slight runny stool over that time period.  He denies fever, chills, chest pain, shortness of breath, vomiting, leg pain, leg swelling.  Patient has chronic history of constipation given that he is on chronic opioids.  Has tried MiraLAX and other bowel regimen at home without success.  Provider told him to come to the emergency department over concern of possible obstruction.  Reviewed recent chart notes     Physical Exam   Triage Vital Signs: ED Triage Vitals [10/20/23 1225]  Encounter Vitals Group     BP      Systolic BP Percentile      Diastolic BP Percentile      Pulse      Resp      Temp      Temp src      SpO2      Weight 151 lb 14.4 oz (68.9 kg)     Height 5\' 8"  (1.727 m)     Head Circumference      Peak Flow      Pain Score 9     Pain Loc      Pain Education      Exclude from Growth Chart     Most recent vital signs: Vitals:   10/20/23 1226  BP: (!) 149/64  Pulse: 64  Resp: (!) 21  Temp: (!) 97.4 F (36.3 C)  SpO2: 98%   Physical Exam: I have reviewed the vital signs and nursing notes. General: Awake, alert, no acute distress.  Nontoxic appearing. Head:  Atraumatic, normocephalic.   ENT:  EOM intact, PERRL. Oral mucosa is pink and moist with no lesions. Neck: Neck is supple with full range of motion, No meningeal signs. Cardiovascular:  RRR, No murmurs. Peripheral pulses palpable and equal bilaterally. Respiratory:  Symmetrical chest wall  expansion.  No rhonchi, rales, or wheezes.  Good air movement throughout.  No use of accessory muscles.   Musculoskeletal:  No cyanosis or edema. Moving extremities with full ROM Abdomen:  Soft, generalized tenderness to palpation, nondistended. Neuro:  GCS 15, moving all four extremities, interacting appropriately. Speech clear. Psych:  Calm, appropriate.   Skin:  Warm, dry, no rash.    ED Results / Procedures / Treatments   Labs (all labs ordered are listed, but only abnormal results are displayed) Labs Reviewed  COMPREHENSIVE METABOLIC PANEL - Abnormal; Notable for the following components:      Result Value   Sodium 129 (*)    Potassium 3.4 (*)    Chloride 95 (*)    Glucose, Bld 122 (*)    Total Bilirubin 1.3 (*)    All other components within normal limits  CBC - Abnormal; Notable for the following components:   WBC 3.8 (*)    RBC 3.80 (*)    Hemoglobin 11.3 (*)    HCT 33.4 (*)    Platelets 34 (*)    All other components within normal limits  URINALYSIS, ROUTINE W REFLEX  MICROSCOPIC - Abnormal; Notable for the following components:   Color, Urine STRAW (*)    APPearance CLEAR (*)    All other components within normal limits  LIPASE, BLOOD     EKG    RADIOLOGY Independently interpreted CT abdomen/pelvis with notable amount of stool throughout the colon along with possible impaction.  Pending official radiology read for further evaluation of possible SBO versus diverticulitis that may be more subtle.   PROCEDURES:  Critical Care performed: No  Procedures   MEDICATIONS ORDERED IN ED: Medications  morphine (PF) 4 MG/ML injection 4 mg (4 mg Intravenous Given 10/20/23 1257)  ondansetron (ZOFRAN) injection 4 mg (4 mg Intravenous Given 10/20/23 1257)  iohexol (OMNIPAQUE) 300 MG/ML solution 100 mL (100 mLs Intravenous Contrast Given 10/20/23 1341)     IMPRESSION / MDM / ASSESSMENT AND PLAN / ED COURSE  I reviewed the triage vital signs and the nursing notes.                               Differential diagnosis includes, but is not limited to, constipation, impaction, diverticulitis, SBO  Patient's presentation is most consistent with acute complicated illness / injury requiring diagnostic workup.  Patient is a 89 year old male presenting today for generalized abdominal pain and constipation.  Vital signs are stable on arrival and physical exam largely reassuring.  Mild nausea associated with symptoms and no bowel movements for 4 days most concerning for constipation or impaction but cannot fully rule out SBO.  Laboratory workup largely reassuring and consistent with baseline.  CT abdomen/pelvis ordered for further evaluation of possible bowel obstruction or other acute intra-abdominal pathology.  I independently interpreted images with large amount of stool noted throughout the colon as well as particularly in the rectal vault.  Pending completion read by radiologist for possible SBO.  Patient signed out to oncoming provider pending final read and ultimate disposition.  The patient is on the cardiac monitor to evaluate for evidence of arrhythmia and/or significant heart rate changes. Clinical Course as of 10/20/23 1510  Tue Oct 20, 2023  1310 Comprehensive metabolic panel(!) Mild hyponatremia but patient otherwise asymptomatic.  May be in the setting of constipation [DW]  1331 CBC(!) Chronically low platelets consistent with patient's baseline [DW]    Clinical Course User Index [DW] [name], MD     FINAL CLINICAL IMPRESSION(S) / ED DIAGNOSES   Final diagnoses:  Generalized abdominal pain  Constipation, unspecified constipation type     Rx / DC Orders   ED Discharge Orders     None        Note:  This document was prepared using Dragon voice recognition software and may include unintentional dictation errors.   [name], MD 10/20/23 [phone]

## 2023-10-20 NOTE — ED Triage Notes (Signed)
Pt here with abd pain. Pt was supposed to have a scan today but pt woke up in severe pain this morning. Pt has not had a bowel movement in several days d/t long term opoid use. Pt states his provider sent him here to rule out SBO. Pt denies VD but endorses nausea.

## 2023-10-20 NOTE — ED Notes (Signed)
Pt ambulated to restroom with the assistance of this RN. Pt tolerated activity well and NAD noted.

## 2023-10-23 ENCOUNTER — Ambulatory Visit: Payer: Medicare Other

## 2024-06-27 ENCOUNTER — Telehealth: Payer: Self-pay | Admitting: Oncology

## 2024-06-27 NOTE — Telephone Encounter (Signed)
 Pt called and wants to get his platelets checked. Stated he had a pacemaker put in and he had to get 2 transfusions before they could do that. He got the pacemaker put in at [facility].  Pt stated he went to a walk in clinic and they told him he needed to get his platelets checked.    Please advise on scheduling.    Pts phone number is [phone]

## 2024-08-16 ENCOUNTER — Encounter: Payer: Self-pay | Admitting: Student in an Organized Health Care Education/Training Program

## 2024-08-17 ENCOUNTER — Ambulatory Visit: Admitting: Student in an Organized Health Care Education/Training Program

## 2024-09-27 ENCOUNTER — Ambulatory Visit
Attending: Student in an Organized Health Care Education/Training Program | Admitting: Student in an Organized Health Care Education/Training Program

## 2024-09-27 VITALS — BP 144/68 | HR 65 | Temp 97.8°F | Resp 16 | Ht 69.0 in | Wt 140.0 lb

## 2024-09-27 DIAGNOSIS — M5136 Other intervertebral disc degeneration, lumbar region with discogenic back pain only: Secondary | ICD-10-CM | POA: Insufficient documentation

## 2024-09-27 DIAGNOSIS — M47816 Spondylosis without myelopathy or radiculopathy, lumbar region: Secondary | ICD-10-CM | POA: Insufficient documentation

## 2024-09-27 NOTE — Progress Notes (Signed)
 Safety precautions to be maintained throughout the outpatient stay will include: orient to surroundings, keep bed in low position, maintain call bell within reach at all times, provide assistance with transfer out of bed and ambulation.

## 2024-09-27 NOTE — Patient Instructions (Signed)

## 2024-09-27 NOTE — Progress Notes (Signed)
 PROVIDER NOTE: Interpretation of information contained herein should be left to medically-trained personnel. Specific patient instructions are provided elsewhere under Patient Instructions section of medical record. This document was created in part using AI and STT-dictation technology, any transcriptional errors that may result from this process are unintentional.  Patient: [name]  Service: E/M Encounter  Provider: Wallie Sherry, MD  DOB: [birth year]  Delivery: Face-to-face  Specialty: Interventional Pain Management  MRN: [id number]  Setting: Ambulatory outpatient facility  Specialty designation: 09  Type: New Patient  Location: Outpatient office facility  PCP: Docia Suzen PARAS, MD  DOS: 09/27/2024    Referring Prov.: Docia Suzen PARAS,*   Primary Reason(s) for Visit: Encounter for initial evaluation of one or more chronic problems (new to examiner) potentially causing chronic pain, and posing a threat to normal musculoskeletal function. (Level of risk: High) CC: Back Pain (back)  HPI  [name] is a 89 y.o. year old, male patient, who comes for the first time to our practice referred by Docia Suzen PARAS,* for our initial evaluation of his chronic pain. He has Occlusion and stenosis of carotid artery without mention of cerebral infarction; Chronic vascular insufficiency of intestine (HCC); Abdominal pain, right lower quadrant; Anemia; Thrombocytopenia; MDS (myelodysplastic syndrome) (HCC); Aneurysm of abdominal vessel; Arthritis; Benign essential hypertension; Bilateral carotid artery stenosis; CAD (coronary artery disease); Hyperlipidemia, mixed; Personal history of other malignant neoplasm of skin; Angina pectoris; S/P CABG x 4; Atrial fibrillation (HCC); Pseudoaneurysm of aorta; On long term drug therapy; Chronic low back pain (1ry area of Pain) (Bilateral) (R>L); DDD (degenerative disc disease), lumbosacral; Opioid-induced constipation; Generalized abdominal pain;  Malignant neoplasm of prostate (HCC); Other long term (current) drug therapy; Pain in joint of right shoulder; Postlaminectomy syndrome, lumbar region; Purpura; Sensorineural hearing loss (SNHL) of both ears; Chronic pain syndrome; Pharmacologic therapy; Disorder of skeletal system; Problems influencing health status; Chronic abdominal pain (2ry area of Pain) (LLQ) (Left); Thoracic radiculitis (T11) (Left); DDD (degenerative disc disease), thoracic; Chronic hip pain (Bilateral); Degeneration of intervertebral disc of lumbar region with discogenic back pain; Failed back surgical syndrome; Diverticulosis of sigmoid colon; Vitamin D  insufficiency; Osteopenia of lumbar spine; Osteopenia determined by x-ray; Lumbosacral facet hypertrophy (Multilevel) (Bilateral); Lumbosacral Grade 1 Anterolisthesis of L5 over S1 (3 mm); Petechial rash; Idiopathic thrombocytopenic purpura (HCC); CMML (chronic myelomonocytic leukemia) (HCC); Nonmelanoma skin cancer; and Constipation on their problem list. Today he comes in for evaluation of his Back Pain (back)  Pain Assessment: Location: Lower Back Radiating: denies Onset: More than a month ago Duration: Chronic pain Quality: Aching, Constant, Sore, Discomfort Severity: 7 /10 (subjective, self-reported pain score)  Effect on ADL: cant work out like he use to. Timing: Constant Modifying factors: Oxycodone , Butran patches 10mg  BP: (!) 144/68  HR: 65  Onset and Duration: Date of injury: 16 years Cause of pain: rupture disk Severity: Getting worse, NAS-11 at its worse: 10/10, NAS-11 at its best: 7/10, NAS-11 now: 8/10, and NAS-11 on the average: 7/10 Timing: During activity or exercise, After activity or exercise, and After a period of immobility Aggravating Factors: Bending, Bowel movements, Kneeling, and Lifiting Alleviating Factors: Medications and Warm showers or baths Associated Problems: Constipation and Fatigue Quality of Pain: Aching, Intermittent, Deep, Heavy,  Nagging, and Sharp Previous Examinations or Tests: CT scan and X-rays Previous Treatments: Narcotic medications and Strengthening exercises  [name] is being evaluated for possible interventional pain management therapies for the treatment of his chronic pain.   Discussed the use of AI scribe software for  clinical note transcription with the patient, who gave verbal consent to proceed.  History of Present Illness   [name] is a 89 year old male with chronic low back pain who presents for evaluation of pain management options.   He has experienced low back pain for over twenty years, originating after an L3 fracture sustained while lifting weights. The pain is localized to the lower back without radiation to the legs and is described as severe and 'almost unbearable' without medication.  In May, he visited the emergency room for back pain and was found to have a heart block, leading to the placement of a pacemaker. Imaging of the lumbar spine was not performed at that time. Previous x-rays from 2021 indicated significant arthritis.  For the past six weeks, he has been in excruciating pain, which he associates with a severe episode of constipation. Prior to this, his pain was well-managed with a Butrans patch, and he did not require additional pain medication. Currently, he uses a Butrans patch and takes Percocet twice daily for pain management.  He experiences intermittent constipation, managed with Miralax  and Senna, and acknowledges that reduced water intake contributed to a recent severe episode. He also reports occasional diarrhea.  He has low platelet counts, typically between twenty and thirty, which precludes the use of blood thinners and poses a risk for interventional procedures.       Historic Controlled Substance Pharmacotherapy Review PMP and historical list of controlled substances:  09/10/2024 06/16/2024  1 Buprenorphine 10 Mcg/hr Patch 4.00 28 Ka Ara  [id number] Nor (4618) 3/3 0.24 mg Medicare [place]  08/31/2024 04/04/2024  1 Oxycodone -Acetaminophen  5-325 120.00 30 Ki Mou [id number] Nor (4618) 0/0 30.00 MME Medicare [place]    Historical Monitoring: The patient  reports no history of drug use. List of prior UDS Testing: Lab Results  Component Value Date   MDMA NONE DETECTED 02/23/2020   COCAINSCRNUR NONE DETECTED 02/23/2020   PCPSCRNUR NONE DETECTED 02/23/2020   THCU NONE DETECTED 02/23/2020   Historical Background Evaluation: [place] PMP: PDMP reviewed during this encounter. Review of the past 10-months conducted.              [place] Department of public safety, offender search: Engineer, mining Information) Non-contributory Risk Assessment Profile: Aberrant behavior: None observed or detected today Risk factors for fatal opioid overdose: None identified today Fatal overdose hazard ratio (HR): Calculation deferred Non-fatal overdose hazard ratio (HR): Calculation deferred Risk of opioid abuse or dependence: 0.7-3.0% with doses <= 36 MME/day and 6.1-26% with doses >= 120 MME/day. Substance use disorder (SUD) risk level: See below Personal History of Substance Abuse (SUD-Substance use disorder):  Alcohol: Negative  Illegal Drugs: Negative  Rx Drugs: Negative  ORT Risk Level calculation: Low Risk  Opioid Risk Tool - 09/27/24 1431       Personal History of Substance Abuse   Alcohol Negative    Illegal Drugs Negative    Rx Drugs Negative      Psychological Disease   Psychological Disease Negative    Depression Negative      Total Score   Opioid Risk Tool Scoring 0    Opioid Risk Interpretation Low Risk         ORT Scoring interpretation table:  Score <3 = Low Risk for SUD  Score between 4-7 = Moderate Risk for SUD  Score >8 = High Risk for Opioid Abuse   PHQ-2 Depression Scale:  Total score:    PHQ-2 Scoring interpretation table: (Score and probability  of major depressive disorder)  Score 0 = No depression  Score 1 = 15.4% Probability  Score  2 = 21.1% Probability  Score 3 = 38.4% Probability  Score 4 = 45.5% Probability  Score 5 = 56.4% Probability  Score 6 = 78.6% Probability   PHQ-9 Depression Scale:  Total score:    PHQ-9 Scoring interpretation table:  Score 0-4 = No depression  Score 5-9 = Mild depression  Score 10-14 = Moderate depression  Score 15-19 = Moderately severe depression  Score 20-27 = Severe depression (2.4 times higher risk of SUD and 2.89 times higher risk of overuse)   Pharmacologic Plan: continue MM with established provider               Meds   Current Outpatient Medications:    amLODipine  (NORVASC ) 10 MG tablet, Take 0.5 tablets (5 mg total) by mouth daily., Disp: 60 tablet, Rfl: 1   Glucosamine-Chondroitin (COSAMIN DS PO), Take 2 tablets by mouth daily at 12 noon., Disp: , Rfl:    LUMIGAN 0.01 % SOLN, 1 drop daily., Disp: , Rfl:    Magnesium  100 MG TABS, Take 200 mg by mouth daily with supper., Disp: , Rfl:    Melatonin 3 MG TABS, Take 5 mg by mouth at bedtime. , Disp: , Rfl:    oxyCODONE -acetaminophen  (PERCOCET/ROXICET) 5-325 MG tablet, Take 1 tablet by mouth every 8 (eight) hours as needed for moderate pain or severe pain., Disp: , Rfl:    polyethylene glycol (MIRALAX  / GLYCOLAX ) packet, Take 17 g by mouth daily., Disp: 14 each, Rfl: 0   rosuvastatin  (CRESTOR ) 20 MG tablet, Take 20 mg by mouth at bedtime. , Disp: , Rfl:    valsartan -hydrochlorothiazide  (DIOVAN  HCT) 160-12.5 MG tablet, Take 1 tablet by mouth daily., Disp: 30 tablet, Rfl: 1  Imaging Review  DG Thoracic Spine 2 View  Narrative CLINICAL DATA:  Back pain.  EXAM: THORACIC SPINE 2 VIEWS  COMPARISON:  CT 02/07/2020.  Chest x-ray 09/12/2019, 05/05/2018.  FINDINGS: Prior CABG. Surgical clips right upper quadrant. Visceral vascular calcification. Diffuse osteopenia. Mild multilevel degenerative change. No acute bony abnormality identified.  IMPRESSION: Diffuse osteopenia. Mild multilevel degenerative change. No  acute abnormality identified.   Electronically Signed By: Debby  Register On: 02/24/2020 07:39  DG Lumbar Spine 2-3 Views  Narrative Clinical Data: Preop spinal stenosis.  LUMBAR SPINE - 2-3 VIEW  Comparison: None  Findings: There are five non-rib bearing lumbar segments.  The lowest open interspace is L5-S1.  There is mild lumbar scoliosis convex to the left.  There is advanced disc degeneration at L2-3 and L3-4  and moderate disc degeneration at L1-2 and L4-5.  There is no fracture.  Alignment is normal.  IMPRESSION: Lumbar degenerative changes as above.  Provider: Candis Molt    Narrative CLINICAL DATA:  Low back pain.  EXAM: LUMBAR SPINE - COMPLETE WITH BENDING VIEWS  COMPARISON:  CT 02/07/2020.  Lumbar spine 10/06/2008.  FINDINGS: Lumbar spine numbered the lowest segmented appearing lumbar shaped vertebrae on lateral view as L5. Surgical clips right upper quadrant. Visceral vascular calcification again noted. Aortoiliac stent graft noted. Diffuse osteopenia. Lumbar spine scoliosis. Diffuse severe multilevel disc degeneration. Complete or near complete loss of disc space noted at L1-L2, L2-L3, L3-L4, and L4-L5. Endplate osteophyte formation and endplate sclerosis noted at all levels. Diffuse facet hypertrophy present particularly prominent at L4-L5 and L5-S1. 3 mm anterolisthesis L5 on S1. No flexion or extension deformity noted. No acute bony abnormality identified. No evidence of  fracture.  IMPRESSION: 1. Diffuse osteopenia. Lumbar spine scoliosis. Diffuse severe multilevel degenerative change lumbar spine as above. 3 mm anterolisthesis L5 on S1. No flexion or extension deformity noted.  2. Aortoiliac stent grafts. Visceral atherosclerotic vascular calcification noted.   Electronically Signed By: Debby  Register On: 02/24/2020 07:31 CT Biopsy  Narrative INDICATION: Thrombocytopenia.  EXAM: CT  BIOPSY  MEDICATIONS: None.  ANESTHESIA/SEDATION: Moderate (conscious) sedation was employed during this procedure. A total of Versed  1.5 mg and Fentanyl  50 mcg was administered intravenously.  Moderate Sedation Time: 23 minutes. The patient's level of consciousness and vital signs were monitored continuously by radiology nursing throughout the procedure under my direct supervision.  COMPLICATIONS: None immediate.  PROCEDURE: Informed written consent was obtained from the patient after a thorough discussion of the procedural risks, benefits and alternatives. All questions were addressed. Sterile Barrier Technique was utilized including mask, sterile gloves, sterile drape, hand hygiene and skin antiseptic. A timeout was performed prior to the initiation of the procedure.  The patient was placed prone on CT scanner. Right flank region was prepped and draped using sterile technique. Local anesthetic was applied. Under CT guidance, 22 gauge spinal needle was directed toward the posterior margin of right iliac bone. Lidocaine  was injected subperiosteally. Needle was removed. Then, also under CT guidance, trocar was directed through the posterior cortex of right iliac bone. Heparinized and non heparinized bone marrow aspirations were obtained and given to the pathology technologist who was present at that time. Then, bone marrow core biopsy was obtained and also given to the pathology technologist. Needle was removed and appropriate dressing was applied.  IMPRESSION: Under CT guidance, successful percutaneous bone marrow aspiration and core biopsy of right iliac bone.   Electronically Signed By: Lynwood Landy Raddle, M.D. On: 08/21/2016 12:13  DG HIP UNILAT W OR W/O PELVIS 2-3 VIEWS RIGHT  Narrative CLINICAL DATA:  Chronic bilateral hip pain. No reported injury. History of prostate cancer treated with brachytherapy.  EXAM: DG HIP (WITH OR WITHOUT PELVIS) 2-3V  RIGHT  COMPARISON:  02/07/2020 CT abdomen/pelvis.  FINDINGS: Brachytherapy seeds overlie symphysis pubis in the expected location of the prostate. Partially visualized aorto bi-iliac stent graft. No aggressive appearing focal osseous lesions. No right hip fracture or dislocation. No significant right hip arthropathy. Degenerative changes in the visualized lower lumbar spine.  IMPRESSION: No acute osseous abnormality. No significant right hip arthropathy. Degenerative changes in the visualized lower lumbar spine.   Electronically Signed By: Selinda DELENA Blue M.D. On: 02/24/2020 07:37    Narrative CLINICAL DATA:  Chronic bilateral hip pain. No reported injury.  EXAM: DG HIP (WITH OR WITHOUT PELVIS) 2-3V LEFT  COMPARISON:  02/07/2020 CT abdomen/pelvis.  FINDINGS: Brachytherapy seeds overlie the symphysis pubis in the expected location of the prostate. No left hip fracture or dislocation. No significant left hip arthropathy. No aggressive appearing focal osseous lesions. Partially visualized left common iliac stent graft.  IMPRESSION: No acute osseous abnormality.  No significant left hip arthropathy.   Electronically Signed By: Selinda DELENA Blue M.D. On: 02/24/2020 07:38   Complexity Note: Imaging results reviewed.                         ROS  Cardiovascular: Heart trouble Pulmonary or Respiratory: No reported pulmonary signs or symptoms such as wheezing and difficulty taking a deep full breath (Asthma), difficulty blowing air out (Emphysema), coughing up mucus (Bronchitis), persistent dry cough, or temporary stoppage of breathing during sleep Neurological: No  reported neurological signs or symptoms such as seizures, abnormal skin sensations, urinary and/or fecal incontinence, being born with an abnormal open spine and/or a tethered spinal cord Psychological-Psychiatric: Difficulty sleeping and or falling asleep Gastrointestinal: Alternating episodes iof diarrhea and  constipation (IBS-Irritable bowe syndrome) Genitourinary: No reported renal or genitourinary signs or symptoms such as difficulty voiding or producing urine, peeing blood, non-functioning kidney, kidney stones, difficulty emptying the bladder, difficulty controlling the flow of urine, or chronic kidney disease Hematological: Brusing easily, Bleeding easily, Born with difficulty clotting (Hemophilia), and Low platelet levels (Thrombocytopenia) Endocrine: No reported endocrine signs or symptoms such as high or low blood sugar, rapid heart rate due to high thyroid levels, obesity or weight gain due to slow thyroid or thyroid disease Rheumatologic: Rheumatoid arthritis and Generalized muscle inflammation and weakness (Polymyositis) Musculoskeletal: Negative for myasthenia gravis, muscular dystrophy, multiple sclerosis or malignant hyperthermia Work History: Retired  Allergies  [name] is allergic to ciprofloxacin.  Laboratory Chemistry Profile   Renal Lab Results  Component Value Date   BUN 19 10/20/2023   CREATININE 0.83 10/20/2023   BCR 14 04/28/2018   GFRAA >60 02/23/2020   GFRNONAA >60 10/20/2023   PROTEINUR NEGATIVE 10/20/2023     Electrolytes Lab Results  Component Value Date   NA 129 (L) 10/20/2023   K 3.4 (L) 10/20/2023   CL 95 (L) 10/20/2023   CALCIUM  9.1 10/20/2023   MG 2.0 09/12/2019     Hepatic Lab Results  Component Value Date   AST 39 10/20/2023   ALT 32 10/20/2023   ALBUMIN  4.9 10/20/2023   ALKPHOS 39 10/20/2023   LIPASE 43 10/20/2023     ID Lab Results  Component Value Date   SARSCOV2NAA NOT DETECTED 09/10/2019   STAPHAUREUS NEGATIVE 09/08/2019   MRSAPCR NEGATIVE 09/08/2019     Bone Lab Results  Component Value Date   VD25OH 26.72 (L) 02/23/2020     Endocrine Lab Results  Component Value Date   GLUCOSE 122 (H) 10/20/2023   GLUCOSEU NEGATIVE 10/20/2023   HGBA1C 5.7 (H) 08/16/2018     Neuropathy Lab Results  Component Value Date    VITAMINB12 740 02/23/2020   HGBA1C 5.7 (H) 08/16/2018     CNS No results found for: COLORCSF, APPEARCSF, RBCCOUNTCSF, WBCCSF, POLYSCSF, LYMPHSCSF, EOSCSF, PROTEINCSF, GLUCCSF, JCVIRUS, CSFOLI, IGGCSF, LABACHR, ACETBL   Inflammation (CRP: Acute  ESR: Chronic) Lab Results  Component Value Date   CRP <0.5 02/23/2020   ESRSEDRATE 7 02/23/2020     Rheumatology No results found for: RF, ANA, LABURIC, URICUR, LYMEIGGIGMAB, LYMEABIGMQN, HLAB27   Coagulation Lab Results  Component Value Date   INR 1.2 09/12/2019   LABPROT 15.4 (H) 09/12/2019   APTT 50 (H) 09/12/2019   PLT 34 (L) 10/20/2023   LABHEMA Note: 04/28/2018     Cardiovascular Lab Results  Component Value Date   TROPONINI <0.03 01/01/2019   HGB 11.3 (L) 10/20/2023   HCT 33.4 (L) 10/20/2023     Screening Lab Results  Component Value Date   SARSCOV2NAA NOT DETECTED 09/10/2019   COVIDSOURCE NASOPHARYNGEAL 09/10/2019   STAPHAUREUS NEGATIVE 09/08/2019   MRSAPCR NEGATIVE 09/08/2019     Cancer No results found for: CEA, CA125, LABCA2   Allergens No results found for: ALMOND, APPLE, ASPARAGUS, AVOCADO, BANANA, BARLEY, BASIL, BAYLEAF, GREENBEAN, LIMABEAN, WHITEBEAN, BEEFIGE, REDBEET, BLUEBERRY, BROCCOLI, CABBAGE, MELON, CARROT, CASEIN, CASHEWNUT, CAULIFLOWER, CELERY     Note: Lab results reviewed.  PFSH  Drug: [name]  reports no history of drug use. Alcohol:  reports no history of alcohol use. Tobacco:  reports that he quit smoking about 27 years ago. His smoking use included cigarettes. He has never used smokeless tobacco. Medical:  has a past medical history of AAA (abdominal aortic aneurysm) (1999), Adenomatous colon polyp, Aorta aneurysm, Arthritis, Basal cell cancer (Dec. 24, 2013), CAD (coronary artery disease), Cancer (HCC) (Aug. 2013), Carotid artery occlusion, Enlarged prostate (2008), Gastroesophageal reflux disease,  GI bleed, Hyperlipidemia, Hypertension, Mild aortic stenosis, Myelodysplasia (myelodysplastic syndrome) (HCC), Pancytopenia (HCC), Peripheral vascular disease, Postoperative atrial fibrillation (HCC), Psoriasis, and S/P CABG (coronary artery bypass graft). Family: family history includes COPD in his father; Cancer in his sister; Heart attack in his brother and father; Heart disease in his brother and father.  Past Surgical History:  Procedure Laterality Date   ABDOMINAL AORTIC ANEURYSM REPAIR  1999   ABDOMINAL AORTIC ENDOVASCULAR STENT GRAFT N/A 09/12/2019   Procedure: ABDOMINAL AORTIC ENDOVASCULAR STENT GRAFT;  Surgeon: Harvey Carlin BRAVO, MD;  Location: [facility] OR;  Service: Vascular;  Laterality: N/A;   CARDIAC CATHETERIZATION  10/2009   CHOLECYSTECTOMY     CORONARY ARTERY BYPASS GRAFT N/A 04/08/2018   Procedure: CORONARY ARTERY BYPASS GRAFTING (CABG) x four, using left mammary artery and right leg greater saphenous vein harvested endoscopically.;  Surgeon: Lucas Dorise POUR, MD;  Location: [facility] OR;  Service: Open Heart Surgery;  Laterality: N/A;   ESOPHAGOGASTRODUODENOSCOPY (EGD) WITH PROPOFOL  N/A 06/26/2016   Procedure: ESOPHAGOGASTRODUODENOSCOPY (EGD) WITH PROPOFOL ;  Surgeon: Gladis RAYMOND Mariner, MD;  Location: [facility] ENDOSCOPY;  Service: Endoscopy;  Laterality: N/A;   EYE SURGERY  1990   cataract surgery bilateral   LEFT HEART CATH AND CORONARY ANGIOGRAPHY N/A 03/15/2018   Procedure: LEFT HEART CATH AND CORONARY ANGIOGRAPHY;  Surgeon: Dann Candyce RAMAN, MD;  Location: [facility] INVASIVE CV LAB;  Service: Cardiovascular;  Laterality: N/A;   PROSTATE SURGERY     SPINE SURGERY  2009   ruptured disc   TEE WITHOUT CARDIOVERSION N/A 04/08/2018   Procedure: TRANSESOPHAGEAL ECHOCARDIOGRAM (TEE);  Surgeon: Lucas Dorise POUR, MD;  Location: [facility] OR;  Service: Open Heart Surgery;  Laterality: N/A;   TONSILLECTOMY     removed as a teenager   Visceral angiogram  05/14/2012   Right  groin area   VISCERAL ANGIOGRAM N/A  05/14/2012   Procedure: VISCERAL ANGIOGRAM;  Surgeon: Carlin BRAVO Harvey, MD;  Location: [facility] CATH LAB;  Service: Cardiovascular;  Laterality: N/A;   Active Ambulatory Problems    Diagnosis Date Noted   Occlusion and stenosis of carotid artery without mention of cerebral infarction 04/29/2012   Chronic vascular insufficiency of intestine (HCC) 05/24/2012   Abdominal pain, right lower quadrant 05/24/2012   Anemia 05/06/2016   Thrombocytopenia 08/17/2016   MDS (myelodysplastic syndrome) (HCC) 09/01/2016   Aneurysm of abdominal vessel 07/23/2016   Arthritis 01/26/2017   Benign essential hypertension 08/07/2015   Bilateral carotid artery stenosis 08/07/2015   CAD (coronary artery disease) 08/07/2015   Hyperlipidemia, mixed 08/07/2015   Personal history of other malignant neoplasm of skin 12/13/2012   Angina pectoris    S/P CABG x 4 04/08/2018   Atrial fibrillation (HCC) 04/13/2018   Pseudoaneurysm of aorta 09/12/2019   On long term drug therapy 12/26/2017   Chronic low back pain (1ry area of Pain) (Bilateral) (R>L) 11/03/2017   DDD (degenerative disc disease), lumbosacral 12/01/2019   Opioid-induced constipation 11/03/2017   Generalized abdominal pain 02/10/2020   Malignant neoplasm of prostate (HCC) 05/27/2012   Other long term (current) drug therapy 12/26/2017   Pain in  joint of right shoulder 12/26/2017   Postlaminectomy syndrome, lumbar region 12/26/2017   Purpura 11/03/2017   Sensorineural hearing loss (SNHL) of both ears 11/03/2017   Chronic pain syndrome 12/26/2017   Pharmacologic therapy 02/20/2020   Disorder of skeletal system 02/20/2020   Problems influencing health status 02/20/2020   Chronic abdominal pain (2ry area of Pain) (LLQ) (Left) 02/20/2020   Thoracic radiculitis (T11) (Left) 02/20/2020   DDD (degenerative disc disease), thoracic 02/20/2020   Chronic hip pain (Bilateral) 02/20/2020   Degeneration of intervertebral disc of lumbar region with discogenic back pain  02/20/2020   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   Diverticulosis of sigmoid colon 02/20/2020   Vitamin D  insufficiency 03/01/2020   Osteopenia of lumbar spine 03/01/2020   Osteopenia determined by x-ray 03/15/2020   Lumbosacral facet hypertrophy (Multilevel) (Bilateral) 03/15/2020   Lumbosacral Grade 1 Anterolisthesis of L5 over S1 (3 mm) 03/15/2020   Petechial rash 03/15/2020   Idiopathic thrombocytopenic purpura (HCC) 03/15/2020   CMML (chronic myelomonocytic leukemia) (HCC) 08/21/2016   Nonmelanoma skin cancer 10/08/2020   Constipation 10/20/2023   Resolved Ambulatory Problems    Diagnosis Date Noted   No Resolved Ambulatory Problems   Past Medical History:  Diagnosis Date   AAA (abdominal aortic aneurysm) 1999   Adenomatous colon polyp    Aorta aneurysm    Basal cell cancer Dec. 24, 2013   Cancer [facility]) Aug. 2013   Carotid artery occlusion    Enlarged prostate 2008   Gastroesophageal reflux disease    GI bleed    Hyperlipidemia    Hypertension    Mild aortic stenosis    Myelodysplasia (myelodysplastic syndrome) (HCC)    Pancytopenia (HCC)    Peripheral vascular disease    Postoperative atrial fibrillation (HCC)    Psoriasis    S/P CABG (coronary artery bypass graft)    Constitutional Exam  General appearance: Well nourished, well developed, and well hydrated. In no apparent acute distress Vitals:   09/27/24 1414  BP: (!) 144/68  Pulse: 65  Resp: 16  Temp: 97.8 F (36.6 C)  SpO2: 99%  Weight: 140 lb (63.5 kg)  Height: 5' 9 (1.753 m)   BMI Assessment: Estimated body mass index is 20.67 kg/m as calculated from the following:   Height as of this encounter: 5' 9 (1.753 m).   Weight as of this encounter: 140 lb (63.5 kg).  BMI interpretation table: BMI level Category Range association with higher incidence of chronic pain  <18 kg/m2 Underweight   18.5-24.9 kg/m2 Ideal body weight   25-29.9 kg/m2 Overweight Increased incidence by 20%  30-34.9 kg/m2  Obese (Class I) Increased incidence by 68%  35-39.9 kg/m2 Severe obesity (Class II) Increased incidence by 136%  >40 kg/m2 Extreme obesity (Class III) Increased incidence by 254%   Patient's current BMI Ideal Body weight  Body mass index is 20.67 kg/m. Ideal body weight: 70.7 kg (155 lb 13.8 oz)   BMI Readings from Last 4 Encounters:  09/27/24 20.67 kg/m  10/20/23 23.10 kg/m  09/20/23 23.10 kg/m  07/22/22 23.11 kg/m   Wt Readings from Last 4 Encounters:  09/27/24 140 lb (63.5 kg)  10/20/23 151 lb 14.4 oz (68.9 kg)  09/20/23 151 lb 14.4 oz (68.9 kg)  07/22/22 152 lb (68.9 kg)    Psych/Mental status: Alert, oriented x 3 (person, place, & time)       Eyes: PERLA Respiratory: No evidence of acute respiratory distress  Lumbar Spine Area Exam  Skin & Axial Inspection: No  masses, redness, or swelling Alignment: Symmetrical Functional ROM: Pain restricted ROM affecting both sides Stability: No instability detected Muscle Tone/Strength: Functionally intact. No obvious neuro-muscular anomalies detected. Sensory (Neurological): Musculoskeletal pain pattern Palpation: Complains of area being tender to palpation         Assessment  Primary Diagnosis & Pertinent Problem List: The primary encounter diagnosis was Lumbar facet arthropathy. Diagnoses of Lumbar spondylosis and Degeneration of intervertebral disc of lumbar region with discogenic back pain were also pertinent to this visit.  Visit Diagnosis (New problems to examiner): 1. Lumbar facet arthropathy   2. Lumbar spondylosis   3. 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 The pain is localized to the lower back without radiation. Imaging from 2021 showed significant  lumbar arthritis, likely worsened. Consider nerve blocks at L3, L4, and L5 if platelet count allows, aiming for 50% pain relief for at least five days to consider ablation. Encourage increasing the Butrans patch dosage to 15 or 20 micrograms for better baseline pain control. Coordinate with the prescribing physician to adjust the Butrans patch dosage. If platelet count reaches 75,000, consider nerve blocks and potential ablation.  Chronic thrombocytopenia   He has chronic thrombocytopenia with platelet counts typically between 20,000 and 30,000, with a recent low of 7,000. The current platelet count is unknown but expected below 75,000, posing a risk for interventional procedures. Check CBC to assess the current platelet count if considering interventional procedures. Discuss the potential for platelet transfusion with the primary care provider to increase the platelet count to 75,000 for a safe spinal injection  Chronic opioid-induced constipation   He experiences intermittent constipation and diarrhea, likely exacerbated by opioid use. A recent severe constipation episode coincided with increased back pain. Continue the current regimen of Miralax  and Senna for constipation management. Ensure adequate hydration to prevent constipation.        Provider-requested follow-up: Return for patient will call to schedule F2F appt prn.  No future appointments. I discussed the assessment and treatment plan with the patient. The patient was provided an opportunity to ask questions and all were answered. The patient agreed with the plan and demonstrated an understanding of the instructions.  Patient advised to call back or seek an in-person evaluation if the symptoms or condition worsens.  I personally spent a total of 40 minutes in the care of the patient today including preparing to see the patient, getting/reviewing separately obtained history, performing a medically appropriate exam/evaluation, counseling  and educating, and documenting clinical information in the EHR.   Note by: Wallie Sherry, MD (TTS and AI technology used. I apologize for any typographical errors that were not detected and corrected.) Date: 09/27/2024; Time: 2:59 PM

## 2024-10-11 ENCOUNTER — Telehealth: Payer: Self-pay | Admitting: Student in an Organized Health Care Education/Training Program

## 2024-10-11 DIAGNOSIS — M47816 Spondylosis without myelopathy or radiculopathy, lumbar region: Secondary | ICD-10-CM

## 2024-10-11 NOTE — Telephone Encounter (Signed)
 Instructions at last visit are for patient to call for appt.

## 2024-10-11 NOTE — Telephone Encounter (Signed)
 PT son stated that PT needs to be seen asap per DR. Lateef. He is having a nerve block. PT blood levels are high enough so he will not need a blood transfusion. DR Marcelino had called him.

## 2024-10-12 ENCOUNTER — Encounter: Payer: Self-pay | Admitting: Student in an Organized Health Care Education/Training Program

## 2024-10-12 DIAGNOSIS — D696 Thrombocytopenia, unspecified: Secondary | ICD-10-CM

## 2024-10-12 NOTE — Telephone Encounter (Signed)
 There are no orders for any kind of procedure.

## 2024-10-13 ENCOUNTER — Telehealth: Payer: Self-pay

## 2024-10-13 ENCOUNTER — Encounter: Payer: Self-pay | Admitting: *Deleted

## 2024-10-13 NOTE — Telephone Encounter (Signed)
 I scheduled this patient for his lumbar facets on 10/24/24. His son is questioning sedation. I asked him if his father would need sedation and he said the doctor would know more about that than him. He said he preferred not to have it but would like for a nurse to call him to discuss.I made him aware that he would need his blood work done the week before to check his platelets.

## 2024-10-18 ENCOUNTER — Encounter: Payer: Self-pay | Admitting: Student in an Organized Health Care Education/Training Program

## 2024-10-18 ENCOUNTER — Other Ambulatory Visit
Admission: RE | Admit: 2024-10-18 | Discharge: 2024-10-18 | Disposition: A | Discharge status: Home / Self Care | Source: Ambulatory Visit | Attending: Student in an Organized Health Care Education/Training Program | Admitting: Student in an Organized Health Care Education/Training Program

## 2024-10-18 DIAGNOSIS — D696 Thrombocytopenia, unspecified: Secondary | ICD-10-CM | POA: Diagnosis present

## 2024-10-18 LAB — PLATELET COUNT: Platelets: 73 K/uL — ABNORMAL LOW (ref 150–400)

## 2024-10-24 ENCOUNTER — Encounter: Payer: Self-pay | Admitting: Student in an Organized Health Care Education/Training Program

## 2024-10-24 ENCOUNTER — Ambulatory Visit
Attending: Student in an Organized Health Care Education/Training Program | Admitting: Student in an Organized Health Care Education/Training Program

## 2024-10-24 VITALS — BP 147/63 | HR 74 | Temp 97.2°F | Ht 69.0 in | Wt 145.0 lb

## 2024-10-24 DIAGNOSIS — D696 Thrombocytopenia, unspecified: Secondary | ICD-10-CM | POA: Insufficient documentation

## 2024-10-24 DIAGNOSIS — M47816 Spondylosis without myelopathy or radiculopathy, lumbar region: Secondary | ICD-10-CM | POA: Diagnosis present

## 2024-10-24 LAB — CBC WITH DIFFERENTIAL/PLATELET
Abs Immature Granulocytes: 0.02 K/uL (ref 0.00–0.07)
Basophils Absolute: 0 K/uL (ref 0.0–0.1)
Basophils Relative: 0 %
Eosinophils Absolute: 0 K/uL (ref 0.0–0.5)
Eosinophils Relative: 0 %
HCT: 25.6 % — ABNORMAL LOW (ref 39.0–52.0)
Hemoglobin: 8.5 g/dL — ABNORMAL LOW (ref 13.0–17.0)
Immature Granulocytes: 1 %
Lymphocytes Relative: 8 %
Lymphs Abs: 0.3 K/uL — ABNORMAL LOW (ref 0.7–4.0)
MCH: 28.3 pg (ref 26.0–34.0)
MCHC: 33.2 g/dL (ref 30.0–36.0)
MCV: 85.3 fL (ref 80.0–100.0)
Monocytes Absolute: 1.3 K/uL — ABNORMAL HIGH (ref 0.1–1.0)
Monocytes Relative: 34 %
Neutro Abs: 2.2 K/uL (ref 1.7–7.7)
Neutrophils Relative %: 57 %
Platelets: 71 K/uL — ABNORMAL LOW (ref 150–400)
RBC: 3 MIL/uL — ABNORMAL LOW (ref 4.22–5.81)
RDW: 15.7 % — ABNORMAL HIGH (ref 11.5–15.5)
WBC: 3.9 K/uL — ABNORMAL LOW (ref 4.0–10.5)
nRBC: 0 % (ref 0.0–0.2)

## 2024-10-24 NOTE — Patient Instructions (Signed)
 TENS  Pain Management Discharge Instructions  General Discharge Instructions :  If you need to reach your doctor call: Monday-Friday 8:00 am - 4:00 pm at [phone] or toll free [phone].  After clinic hours [phone] to have operator reach doctor.  Bring all of your medication bottles to all your appointments in the pain clinic.  To cancel or reschedule your appointment with Pain Management please remember to call 24 hours in advance to avoid a fee.  Refer to the educational materials which you have been given on: General Risks, I had my Procedure. Discharge Instructions, Post Sedation.  Post Procedure Instructions:  The drugs you were given will stay in your system until tomorrow, so for the next 24 hours you should not drive, make any legal decisions or drink any alcoholic beverages.  You may eat anything you prefer, but it is better to start with liquids then soups and crackers, and gradually work up to solid foods.  Please notify your doctor immediately if you have any unusual bleeding, trouble breathing or pain that is not related to your normal pain.  Depending on the type of procedure that was done, some parts of your body may feel week and/or numb.  This usually clears up by tonight or the next day.  Walk with the use of an assistive device or accompanied by an adult for the 24 hours.  You may use ice on the affected area for the first 24 hours.  Put ice in a Ziploc bag and cover with a towel and place against area 15 minutes on 15 minutes off.  You may switch to heat after 24 hours.

## 2024-10-24 NOTE — Progress Notes (Signed)
 Safety precautions to be maintained throughout the outpatient stay will include: orient to surroundings, keep bed in low position, maintain call bell within reach at all times, provide assistance with transfer out of bed and ambulation.

## 2024-10-24 NOTE — Progress Notes (Signed)
 PROVIDER NOTE: Interpretation of information contained herein should be left to medically-trained personnel. Specific patient instructions are provided elsewhere under Patient Instructions section of medical record. This document was created in part using AI and STT-dictation technology, any transcriptional errors that may result from this process are unintentional.  Patient: [name]  Service: E/M   PCP: Docia Suzen PARAS, MD  DOB: 03-Aug-1926  DOS: 10/24/2024  Provider: Wallie Sherry, MD  MRN: [id number]  Delivery: Face-to-face  Specialty: Interventional Pain Management  Type: Established Patient  Setting: Ambulatory outpatient facility  Specialty designation: 09  Referring Prov.: Sherry Wallie, MD  Location: Outpatient office facility       History of present illness (HPI) [name], a 89 y.o. year old male, is here today because of his Thrombocytopenia [D69.6]. [name]'s primary complain today is Back Pain (lower)  Pertinent problems: [name] does not have any pertinent problems on file.  Pain Assessment: Severity of Chronic pain is reported as a 10-Worst pain ever/10. Location: Back Lower/Denies. Onset: More than a month ago. Quality: Aching, Burning, Sore, Discomfort. Timing: Constant. Modifying factor(s): meds and patches. Vitals:  height is 5' 9 (1.753 m) and weight is 145 lb (65.8 kg). His temperature is 97.2 F (36.2 C) (abnormal). His blood pressure is 147/63 (abnormal) and his pulse is 74. His oxygen saturation is 100%.  BMI: Estimated body mass index is 21.41 kg/m as calculated from the following:   Height as of this encounter: 5' 9 (1.753 m).   Weight as of this encounter: 145 lb (65.8 kg).  Last encounter: 09/27/2024. Last procedure: Visit date not found.  Reason for encounter:   Increased low back pain Chronic thrombocytopenia status post platelet transfusion  ROS  Constitutional: Denies any fever or chills Gastrointestinal: No  reported hemesis, hematochezia, vomiting, or acute GI distress Musculoskeletal: Axial low back pain Neurological: No reported episodes of acute onset apraxia, aphasia, dysarthria, agnosia, amnesia, paralysis, loss of coordination, or loss of consciousness  Medication Review  Glucosamine-Chondroitin, Magnesium , amLODipine , bimatoprost, melatonin, oxyCODONE -acetaminophen , polyethylene glycol, rosuvastatin , and valsartan -hydrochlorothiazide   History Review  Allergy: [name] is allergic to ciprofloxacin. Drug: [name]  reports no history of drug use. Alcohol:  reports no history of alcohol use. Tobacco:  reports that he quit smoking about 27 years ago. His smoking use included cigarettes. He has never used smokeless tobacco. Social: [name]  reports that he quit smoking about 27 years ago. His smoking use included cigarettes. He has never used smokeless tobacco. He reports that he does not drink alcohol and does not use drugs. Medical:  has a past medical history of AAA (abdominal aortic aneurysm) (1999), Adenomatous colon polyp, Aorta aneurysm, Arthritis, Basal cell cancer (Dec. 24, 2013), CAD (coronary artery disease), Cancer (HCC) (Aug. 2013), Carotid artery occlusion, Enlarged prostate (2008), Gastroesophageal reflux disease, GI bleed, Hyperlipidemia, Hypertension, Mild aortic stenosis, Myelodysplasia (myelodysplastic syndrome) (HCC), Pancytopenia (HCC), Peripheral vascular disease, Postoperative atrial fibrillation (HCC), Psoriasis, and S/P CABG (coronary artery bypass graft). Surgical: [name]  has a past surgical history that includes Cholecystectomy; Cardiac catheterization (10/2009); Spine surgery (2009); Visceral angiogram (05/14/2012); Abdominal aortic aneurysm repair (1999); visceral angiogram (N/A, 05/14/2012); Esophagogastroduodenoscopy (egd) with propofol  (N/A, 06/26/2016); Prostate surgery; LEFT HEART CATH AND CORONARY ANGIOGRAPHY (N/A, 03/15/2018); Coronary artery bypass  graft (N/A, 04/08/2018); TEE without cardioversion (N/A, 04/08/2018); Eye surgery (1990); Tonsillectomy; and Abdominal aortic endovascular stent graft (N/A, 09/12/2019). Family: family history includes COPD in his father; Cancer in his sister; Heart attack in his  brother and father; Heart disease in his brother and father.  Laboratory Chemistry Profile   Renal Lab Results  Component Value Date   BUN 19 10/20/2023   CREATININE 0.83 10/20/2023   BCR 14 04/28/2018   GFRAA >60 02/23/2020   GFRNONAA >60 10/20/2023    Hepatic Lab Results  Component Value Date   AST 39 10/20/2023   ALT 32 10/20/2023   ALBUMIN  4.9 10/20/2023   ALKPHOS 39 10/20/2023   LIPASE 43 10/20/2023    Electrolytes Lab Results  Component Value Date   NA 129 (L) 10/20/2023   K 3.4 (L) 10/20/2023   CL 95 (L) 10/20/2023   CALCIUM  9.1 10/20/2023   MG 2.0 09/12/2019    Bone Lab Results  Component Value Date   VD25OH 26.72 (L) 02/23/2020    Inflammation (CRP: Acute Phase) (ESR: Chronic Phase) Lab Results  Component Value Date   CRP <0.5 02/23/2020   ESRSEDRATE 7 02/23/2020         Note: Above Lab results reviewed.  Recent Imaging Review  CT ABDOMEN PELVIS W CONTRAST CLINICAL DATA:  Worsening abdominal pain today. Nausea but no vomiting. No bowel movement for 4 days.  EXAM: CT ABDOMEN AND PELVIS WITH CONTRAST  TECHNIQUE: Multidetector CT imaging of the abdomen and pelvis was performed using the standard protocol following bolus administration of intravenous contrast.  RADIATION DOSE REDUCTION: This exam was performed according to the departmental dose-optimization program which includes automated exposure control, adjustment of the mA and/or kV according to patient size and/or use of iterative reconstruction technique.  CONTRAST:  100mL OMNIPAQUE  IOHEXOL  300 MG/ML  SOLN  COMPARISON:  CT 02/12/2023 and older.  FINDINGS: Lower chest: Linear opacity along the lung bases likely scar or atelectasis.  No pleural effusion. Patient is status post median sternotomy. Heart is slightly enlarged. Prominent calcifications along the coronary arteries in the aortic valve.  Hepatobiliary: Diffuse dilatation of the biliary tree down to the ampulla, unchanged from previous. Prior cholecystectomy. Patent portal vein. There are some small low-attenuation lesions in the right hepatic lobe which are too small to completely characterize but likely benign cystic lesions. These are also unchanged from previous. Small foci in segment 4 as well are stable. No additional space-occupying liver lesion  Pancreas: Diffuse pancreatic mild atrophy with pancreatic ductal dilatation, similar compared to the previous.  Spleen: Normal in size without focal abnormality.  Adrenals/Urinary Tract: Preserved adrenal glands. Stable Bosniak 1 left-sided renal cysts and Bosniak 2 foci. No additional follow-up. Mild bilateral renal atrophy. No separate new enhancing renal mass collecting system dilatation. Preserved contours of the urinary bladder.  Stomach/Bowel: Stomach is distended with fluid. There is also fluid along nondilated loops of ileum. The jejunum is nondilated. The large bowel has a moderate stool along the left side. More fluid on the right side. Few left-sided colonic diverticula.  Vascular/Lymphatic: No specific abnormal lymph node enlargement identified in the abdomen and pelvis. Normal caliber IVC. Once again there is an aortic endograft in place, similar to previous. There is suggestion of a small endoleak just above the aortic bifurcation as seen on series 2, image 53, unchanged from previous. Diameter of the aneurysm today would measure up to 3.3 by 3.1 cm and previously when measured in the same fashion 3.2 by 3.1 cm, not significantly changed.  Reproductive: Brachytherapy changes along the prostate.  Other: No free air or free fluid.  Musculoskeletal: Curvature of the spine with osteopenia  and degenerative changes. Surgical changes along the lumbar spine  with multilevel fusion. Disc bulging and osteophyte formation with stenosis.  IMPRESSION: Moderate left-sided colonic stool. The bowel otherwise is nondilated with several fluid-filled loops of bowel including distal ileum, colon. The stomach is also distended with air and fluid.  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 Small endoleak again suggested.  Electronically Signed   By: Ranell Bring M.D.   On: 10/20/2023 15:54 Note: Reviewed        Physical Exam  Vitals: BP (!) 147/63   Pulse 74   Temp (!) 97.2 F (36.2 C)   Ht 5' 9 (1.753 m)   Wt 145 lb (65.8 kg)   SpO2 100%   BMI 21.41 kg/m  BMI: Estimated body mass index is 21.41 kg/m as calculated from the following:   Height as of this encounter: 5' 9 (1.753 m).   Weight as of this encounter: 145 lb (65.8 kg). Ideal: Ideal body weight: 70.7 kg (155 lb 13.8 oz) General appearance: Well nourished, well developed, and well hydrated. In no apparent acute distress Mental status: Alert, oriented x 3 (person, place, & time)       Respiratory: No evidence of acute respiratory distress Eyes: PERLA Axial low back pain worse with facet loading  Patient presents today in wheelchair  Assessment   Diagnosis  1. Thrombocytopenia   2. Lumbar facet arthropathy   3. Lumbar spondylosis      Updated Problems: No problems updated.  Plan of Care  Patient is scheduled for diagnostic lumbar medial branch (facet) blocks but has thrombocytopenia with platelets persistently <75,000 despite two transfusions. Given the bleeding risk, I do not feel comfortable proceeding with this elective procedure at the current platelet level. I explained this to the patient and his son, and we will defer the blocks and coordinate with hematology to optimize platelet count; we can reconsider when platelets are  consistently >=75,000 (and per institutional guidance). In the meantime, we recommend conservative measures: trial a TENS unit, consider a trial of Jornavax (as discussed), continue medication management with the Butrans patch and oxycodone  through his PCP. If he prefers, our clinic can assume medication management--please let us  know and we will formalize the transition. We will follow clinically, reinforce fall/bleeding precautions, and reassess candidacy for facet blocks once hematologic status is optimized.  Orders:  Orders Placed This Encounter  Procedures   CBC with Differential/Platelet    Standing Status:   Standing    Number of Occurrences:   1    No follow-ups on file.    Recent Visits Date Type Provider Dept  09/27/24 Office Visit Marcelino Nurse, MD [facility]  Showing recent visits within past 90 days and meeting all other requirements Today's Visits Date Type Provider Dept  10/24/24 Procedure visit Marcelino Nurse, MD [facility]  Showing today's visits and meeting all other requirements Future Appointments No visits were found meeting these conditions. Showing future appointments within next 90 days and meeting all other requirements  I discussed the assessment and treatment plan with the patient. The patient was provided an opportunity to ask questions and all were answered. The patient agreed with the plan and demonstrated an understanding of the instructions.  Patient advised to call back or seek an in-person evaluation if the symptoms or condition worsens. I personally spent a total of 30 minutes in the care of the patient today including preparing to see the patient, getting/reviewing separately obtained history, performing a  medically appropriate exam/evaluation, counseling and educating, placing orders, and documenting clinical information in the EHR.   Note by: Wallie Sherry, MD (TTS and AI technology used. I apologize for any typographical errors  that were not detected and corrected.) Date: 10/24/2024; Time: 3:32 PM

## 2024-10-25 ENCOUNTER — Telehealth: Payer: Self-pay | Admitting: Student in an Organized Health Care Education/Training Program

## 2024-10-25 ENCOUNTER — Telehealth: Payer: Self-pay | Admitting: *Deleted

## 2024-10-25 NOTE — Telephone Encounter (Signed)
 Son bought a tens unit for his father. On  the side of the box it says do not use with pacemaker. Please ask Dr Marcelino if patient is safe to use on this patient

## 2024-10-29 ENCOUNTER — Encounter: Payer: Self-pay | Admitting: Student in an Organized Health Care Education/Training Program

## 2024-11-08 NOTE — Progress Notes (Signed)
 ------------------------------------------------------------------------------- Attestation signed by Myriam Hoy Knee, MD at 11/09/24 0016 [name] is currently being treated primarily for disabling Acute on chronic low back pain due to DJD, starting IV ketamine today  I saw and evaluated the patient 11/08/24, participating in the key portions of the service.  I reviewed the resident's note.  I agree with the resident's findings and plan.  Issues Impacting Complexity of Management <redacted file path>: <redacted file path>   -High risk of complications from pain and/or analgesia likely to result in delirium Medical Decision Making: Discussed the patient's management and/or test interpretation with Palliative Care as summarized within this note.   Hoy DELENA Myriam, MD  -------------------------------------------------------------------------------  Geriatrics ([facility]) Progress Note  Assessment & Plan:  [name] is a 89 y.o. male with a PMHx of chronic myelomonocytic leukemia c/b pancytopenia, chronic back pain on longterm opioids, hx subdural hematoma, CAD, carotid artery stenosis, AAA s/p EVAR (2020), HTN  that presented to [facility] with Acute on chronic low back pain .  Principal Problem:   Acute on chronic low back pain Active Problems:   Benign essential hypertension   CAD (coronary artery disease)   Hyperlipidemia, mixed   Constipation due to opioid therapy   Sensorineural hearing loss (SNHL) of both ears   CMML (chronic myelomonocytic leukemia)    (CMS-HCC)   Dysthymia   Pancytopenia (CMS-HCC)  #Acute on Chronic Low Back Pain Pt has hx of chronic low back pain with known spondylosis & prior lumbar vertebral fractures, but now presenting for worsening lower back pain over the past 4 months. Patient ambulatory and very functional at baseline but limited mobility with pain now. No precipitating trauma, procedure, strain per history. Last lumbar imaging in  2023 with multilevel lumbar spondylosis but no acute fracture. Patient has been on high-dose oral opioid therapy for multiple months now that is not improving his pain. On exam, patient in 10/10 pain with any movement of lower back with associated passive suicidal ideation.  Xray spine with severe degenerative disease. MRI spine with multilevel degenerative changes of lumbar spine with no fractures or abscess. Palliative consulted for pain management & goals of care discussion. - Consulted palliative care, appreciate recommendations for pain: - start IV ketamine, dosing per palliative team - oxycodone  10mg  q6h PRN - continue home duloxetine 20 mg daily - continue home Tylenol  1 g q8h - buprenorphine 15mg  patch every 7 days - Goals of care: ongoing discussions b/w palliative & pt regarding hospice   #Opioid Induced Constipation Patient has history of prior admission for constipation/ileus in the past due to opioid-induced constipation. Low concern for bowel obstruction given pt with no nausea, vomiting, or abdominal rigidity. KUB with moderate stool burden but no obstructive gas pattern. Patient had BM after enema on 11/16 but will continue to be proactive given hx of prior obstructions. - Miralax  BID - Senna nightly - bisacodyl  suppository daily   #CCML c/b pancytopenia #Thrombocytopenia Patient has long-standing history of pancytopenia for multiple years. Of note, patient was transfused with 2u platelets on 11/3 in attempt to get nerve block procedure for back pain but platelets did not improve to target. Thrombocytopenia slightly worsened today to 30. No indication for transfusion given no signs of bleeding but will closely monitor. - daily CBC - pending peripheral smear  #Hypovolemic hyponatremia Likely iso poor PO intake given patient's pain. - 1L NS over 3h   The patient's presentation is complicated by the following clinically significant conditions requiring additional evaluation and  treatment: - Thrombocytopenia requiring further investigation or monitor     Mentation CAM:   CAM: Negative 6CIT: 6CIT Total Score: 4 (Nov 05, 2024 1400 : Graves, Dara, RN) Normal (score 0-7) Delirium Order Set: Ordered (appropriate for any patient >65) Dementia: No. Behavioral Symptoms (baseline or now): No. If any behavioral symptoms, agitation or delirium consider using to the medabehavioral dot phrase in a significant event note.  Mobility Baseline functional status: Independent in ADLs Current functional status: Dependent in ADLs PT/OT Ordered: Yes  Medications Reviewed home medications, screening for potentially inappropriate medications: Yes Medications recommended de-prescribing: n/a  What Matters Geri Assessment complete: No, needs to be done    Chronic Problems: #Essential HTN - home amlodipine  - home valsartan -hydrochlorothiazide    #HLD - home atorvastatin   #Depression - home wellbutrin  Daily Checklist: Diet: Regular Diet DVT PPx: SCDs Electrolytes: Replete Potassium to >/= 3.6 and Magnesium  to >/= 1.8 Code Status: DNR and DNI Dispo: pending control of pain  Team Contact Information:  Primary Team: Geriatrics ([facility]) Primary Resident: Gildardo Budds, MD, Resident's Pager: [phone] (Geriatrics Intern - [place])  Interval History:  No acute events overnight.  Patient's pain is not further improved after switching from IV morphine  to oxycodone . He still reports his pain is 7/10 while still and 10/10 when moving.  He has not had a bowel movement since his enema 2 days prior and is amenable to doing daily suppositories.  All other systems were reviewed and are negative except as noted in the HPI  Objective:  Temp:  [36.1 C (97 F)-36.9 C (98.4 F)] 36.6 C (97.9 F) Pulse:  [70-81] 70 Resp:  [16-18] 16 BP: (136-167)/(65-75) 148/70 SpO2:  [95 %-97 %] 95 %,  Intake/Output Summary (Last 24 hours) at 11/08/2024 0650 Last data filed at 11/08/2024  0630 Gross per 24 hour  Intake 120 ml  Output 900 ml  Net -780 ml    Gen: WDWN in NAD, answers questions appropriately Eyes: sclera anicteric, EOMI HENT: atraumatic, MMM, OP w/o erythema or exudate  Heart: RRR, S1, S2, no M/R/G, no chest wall tenderness Lungs: CTAB, no crackles or wheezes, no use of accessory muscles Abdomen: Normoactive bowel sounds, soft, mild tenderness in epigastric region, no rebound/guarding MSK: extreme pain with any movement of lower back Extremities: no clubbing, cyanosis, or edema in the BLEs Psych: Alert, oriented, appropriate mood and affect  Labs/Studies: Labs and Studies from the last 24hrs per EMR and Reviewed

## 2024-11-21 DEATH — deceased
